# Patient Record
Sex: Male | Born: 1935 | Race: White | Hispanic: No | Smoking: Former smoker
Health system: Southern US, Community
[De-identification: ages and names within clinical notes are randomized; demographics above are authoritative.]

## PROBLEM LIST (undated history)

## (undated) DIAGNOSIS — C801 Malignant (primary) neoplasm, unspecified: Secondary | ICD-10-CM

## (undated) DIAGNOSIS — Z87442 Personal history of urinary calculi: Secondary | ICD-10-CM

## (undated) DIAGNOSIS — M1990 Unspecified osteoarthritis, unspecified site: Secondary | ICD-10-CM

## (undated) DIAGNOSIS — I4891 Unspecified atrial fibrillation: Secondary | ICD-10-CM

## (undated) DIAGNOSIS — I499 Cardiac arrhythmia, unspecified: Secondary | ICD-10-CM

## (undated) HISTORY — DX: Malignant (primary) neoplasm, unspecified: C80.1

## (undated) HISTORY — PX: OTHER SURGICAL HISTORY: SHX169

## (undated) HISTORY — PX: JOINT REPLACEMENT: SHX530

## (undated) HISTORY — PX: CHOLECYSTECTOMY: SHX55

## (undated) HISTORY — PX: HERNIA REPAIR: SHX51

## (undated) HISTORY — PX: PROSTATE SURGERY: SHX751

---

## 2010-08-26 ENCOUNTER — Inpatient Hospital Stay (HOSPITAL_COMMUNITY): Admission: EM | Admit: 2010-08-26 | Discharge: 2010-09-01 | Payer: Self-pay | Admitting: Emergency Medicine

## 2011-02-24 LAB — URINE CULTURE
Colony Count: NO GROWTH
Culture: NO GROWTH

## 2011-02-24 LAB — CARDIAC PANEL(CRET KIN+CKTOT+MB+TROPI)
CK, MB: 2.4 ng/mL (ref 0.3–4.0)
CK, MB: 2.9 ng/mL (ref 0.3–4.0)
CK, MB: 2.9 ng/mL (ref 0.3–4.0)
Relative Index: INVALID (ref 0.0–2.5)
Relative Index: INVALID (ref 0.0–2.5)
Total CK: 55 U/L (ref 7–232)
Total CK: 67 U/L (ref 7–232)
Troponin I: 0.02 ng/mL (ref 0.00–0.06)
Troponin I: 0.03 ng/mL (ref 0.00–0.06)

## 2011-02-24 LAB — BASIC METABOLIC PANEL
BUN: 14 mg/dL (ref 6–23)
CO2: 28 mEq/L (ref 19–32)
Calcium: 8.5 mg/dL (ref 8.4–10.5)
Calcium: 8.8 mg/dL (ref 8.4–10.5)
Chloride: 111 mEq/L (ref 96–112)
Creatinine, Ser: 1.12 mg/dL (ref 0.4–1.5)
GFR calc Af Amer: >60 mL/min (ref >60)
GFR calc Af Amer: >60 mL/min (ref >60)
GFR calc non Af Amer: >60 mL/min (ref >60)
GFR calc non Af Amer: >60 mL/min (ref >60)
Glucose, Bld: 115 mg/dL — ABNORMAL HIGH (ref 70–99)
Glucose, Bld: 96 mg/dL (ref 70–99)
Potassium: 4.6 mEq/L (ref 3.5–5.1)
Sodium: 140 mEq/L (ref 135–145)
Sodium: 142 mEq/L (ref 135–145)

## 2011-02-24 LAB — POCT I-STAT, CHEM 8
Calcium, Ion: 1.09 mmol/L — ABNORMAL LOW (ref 1.12–1.32)
Hemoglobin: 18 g/dL — ABNORMAL HIGH (ref 13.0–17.0)
Sodium: 142 mEq/L (ref 135–145)
TCO2: 23 mmol/L (ref 0–100)

## 2011-02-24 LAB — POCT CARDIAC MARKERS
CKMB, poc: <1 ng/mL — ABNORMAL LOW (ref 1.0–8.0)
Myoglobin, poc: 75.1 ng/mL (ref 12–200)
Myoglobin, poc: 99.8 ng/mL (ref 12–200)

## 2011-02-24 LAB — CBC
HCT: 41 % (ref 39.0–52.0)
HCT: 45.1 % (ref 39.0–52.0)
HCT: 46.8 % (ref 39.0–52.0)
HCT: 48.6 % (ref 39.0–52.0)
Hemoglobin: 13.1 g/dL (ref 13.0–17.0)
Hemoglobin: 13.3 g/dL (ref 13.0–17.0)
Hemoglobin: 16 g/dL (ref 13.0–17.0)
MCH: 31 pg (ref 26.0–34.0)
MCH: 31.1 pg (ref 26.0–34.0)
MCH: 31.3 pg (ref 26.0–34.0)
MCHC: 34.1 g/dL (ref 30.0–36.0)
MCHC: 34.2 g/dL (ref 30.0–36.0)
MCHC: 34.4 g/dL (ref 30.0–36.0)
MCV: 91.1 fL (ref 78.0–100.0)
MCV: 92.3 fL (ref 78.0–100.0)
MCV: 93.1 fL (ref 78.0–100.0)
Platelets: 197 10*3/uL (ref 150–400)
Platelets: 205 10*3/uL (ref 150–400)
Platelets: 207 10*3/uL (ref 150–400)
Platelets: 247 10*3/uL (ref 150–400)
RBC: 4.23 MIL/uL (ref 4.22–5.81)
RBC: 4.3 MIL/uL (ref 4.22–5.81)
RDW: 13.9 % (ref 11.5–15.5)
RDW: 14.1 % (ref 11.5–15.5)
RDW: 14.1 % (ref 11.5–15.5)
RDW: 14.2 % (ref 11.5–15.5)
RDW: 14.3 % (ref 11.5–15.5)
WBC: 6.2 10*3/uL (ref 4.0–10.5)
WBC: 7.3 10*3/uL (ref 4.0–10.5)
WBC: 7.3 10*3/uL (ref 4.0–10.5)

## 2011-02-24 LAB — LIPID PANEL
Cholesterol: 178 mg/dL (ref 0–200)
LDL Cholesterol: 128 mg/dL — ABNORMAL HIGH (ref 0–99)
Triglycerides: 55 mg/dL (ref <150)
VLDL: 11 mg/dL (ref 0–40)

## 2011-02-24 LAB — DIFFERENTIAL
Basophils Absolute: 0 10*3/uL (ref 0.0–0.1)
Basophils Relative: 1 % (ref 0–1)
Lymphocytes Relative: 24 % (ref 12–46)
Neutro Abs: 4.9 10*3/uL (ref 1.7–7.7)
Neutrophils Relative %: 67 % (ref 43–77)

## 2011-02-24 LAB — APTT: aPTT: 29 seconds (ref 24–37)

## 2011-02-24 LAB — PROTIME-INR
INR: 0.92 (ref 0.00–1.49)
INR: 1.08 (ref 0.00–1.49)
INR: 1.18 (ref 0.00–1.49)
Prothrombin Time: 12.6 seconds (ref 11.6–15.2)
Prothrombin Time: 14.2 seconds (ref 11.6–15.2)
Prothrombin Time: 15.2 seconds (ref 11.6–15.2)
Prothrombin Time: 22.5 seconds — ABNORMAL HIGH (ref 11.6–15.2)

## 2011-02-24 LAB — URINALYSIS, ROUTINE W REFLEX MICROSCOPIC
Bilirubin Urine: NEGATIVE
Glucose, UA: NEGATIVE mg/dL
Hgb urine dipstick: NEGATIVE
Specific Gravity, Urine: 1.022 (ref 1.005–1.030)
Urobilinogen, UA: 1 mg/dL (ref 0.0–1.0)

## 2011-02-24 LAB — TSH: TSH: 1.116 u[IU]/mL (ref 0.350–4.500)

## 2011-02-24 LAB — HEPARIN LEVEL (UNFRACTIONATED)
Heparin Unfractionated: 0.28 IU/mL — ABNORMAL LOW (ref 0.30–0.70)
Heparin Unfractionated: 0.28 IU/mL — ABNORMAL LOW (ref 0.30–0.70)
Heparin Unfractionated: 0.48 IU/mL (ref 0.30–0.70)

## 2011-12-10 ENCOUNTER — Emergency Department (HOSPITAL_COMMUNITY): Payer: Medicare Other

## 2011-12-10 ENCOUNTER — Inpatient Hospital Stay (HOSPITAL_COMMUNITY)
Admission: EM | Admit: 2011-12-10 | Discharge: 2011-12-11 | DRG: 313 | Disposition: A | Discharge status: Home / Self Care | Payer: Medicare Other | Attending: Cardiovascular Disease | Admitting: Cardiovascular Disease

## 2011-12-10 ENCOUNTER — Other Ambulatory Visit: Payer: Self-pay

## 2011-12-10 DIAGNOSIS — I10 Essential (primary) hypertension: Secondary | ICD-10-CM | POA: Diagnosis present

## 2011-12-10 DIAGNOSIS — Z79899 Other long term (current) drug therapy: Secondary | ICD-10-CM

## 2011-12-10 DIAGNOSIS — R072 Precordial pain: Principal | ICD-10-CM | POA: Diagnosis present

## 2011-12-10 DIAGNOSIS — I4891 Unspecified atrial fibrillation: Secondary | ICD-10-CM | POA: Diagnosis present

## 2011-12-10 DIAGNOSIS — I2510 Atherosclerotic heart disease of native coronary artery without angina pectoris: Secondary | ICD-10-CM | POA: Diagnosis present

## 2011-12-10 DIAGNOSIS — Z881 Allergy status to other antibiotic agents status: Secondary | ICD-10-CM

## 2011-12-10 DIAGNOSIS — Z888 Allergy status to other drugs, medicaments and biological substances status: Secondary | ICD-10-CM

## 2011-12-10 DIAGNOSIS — I200 Unstable angina: Secondary | ICD-10-CM

## 2011-12-10 DIAGNOSIS — Z7901 Long term (current) use of anticoagulants: Secondary | ICD-10-CM

## 2011-12-10 DIAGNOSIS — Z96649 Presence of unspecified artificial hip joint: Secondary | ICD-10-CM

## 2011-12-10 DIAGNOSIS — Z882 Allergy status to sulfonamides status: Secondary | ICD-10-CM

## 2011-12-10 DIAGNOSIS — F17200 Nicotine dependence, unspecified, uncomplicated: Secondary | ICD-10-CM | POA: Diagnosis present

## 2011-12-10 DIAGNOSIS — R079 Chest pain, unspecified: Secondary | ICD-10-CM | POA: Diagnosis present

## 2011-12-10 HISTORY — DX: Unspecified osteoarthritis, unspecified site: M19.90

## 2011-12-10 HISTORY — DX: Cardiac arrhythmia, unspecified: I49.9

## 2011-12-10 LAB — DIFFERENTIAL
Basophils Absolute: 0 10*3/uL (ref 0.0–0.1)
Eosinophils Absolute: 0.1 10*3/uL (ref 0.0–0.7)
Lymphocytes Relative: 11 % — ABNORMAL LOW (ref 12–46)
Lymphs Abs: 1.2 10*3/uL (ref 0.7–4.0)
Neutrophils Relative %: 81 % — ABNORMAL HIGH (ref 43–77)

## 2011-12-10 LAB — COMPREHENSIVE METABOLIC PANEL
ALT: 14 U/L (ref 0–53)
AST: 15 U/L (ref 0–37)
Alkaline Phosphatase: 104 U/L (ref 39–117)
CO2: 26 mEq/L (ref 19–32)
GFR calc Af Amer: 73 mL/min — ABNORMAL LOW (ref >90)
Glucose, Bld: 108 mg/dL — ABNORMAL HIGH (ref 70–99)
Potassium: 4.5 mEq/L (ref 3.5–5.1)
Sodium: 141 mEq/L (ref 135–145)
Total Protein: 6.9 g/dL (ref 6.0–8.3)

## 2011-12-10 LAB — PROTIME-INR
INR: 2.21 — ABNORMAL HIGH (ref 0.00–1.49)
Prothrombin Time: 24.9 seconds — ABNORMAL HIGH (ref 11.6–15.2)

## 2011-12-10 LAB — APTT: aPTT: 38 seconds — ABNORMAL HIGH (ref 24–37)

## 2011-12-10 LAB — CBC
Platelets: 247 10*3/uL (ref 150–400)
RBC: 4.77 MIL/uL (ref 4.22–5.81)
WBC: 10.9 10*3/uL — ABNORMAL HIGH (ref 4.0–10.5)

## 2011-12-10 LAB — CARDIAC PANEL(CRET KIN+CKTOT+MB+TROPI)
CK, MB: 2.5 ng/mL (ref 0.3–4.0)
Relative Index: INVALID (ref 0.0–2.5)
Troponin I: <0.3 ng/mL (ref <0.30)

## 2011-12-10 MED ORDER — SODIUM CHLORIDE 0.9 % IV SOLN
INTRAVENOUS | Status: DC
  Administered 2011-12-10: 23:00:00 via INTRAVENOUS

## 2011-12-10 NOTE — ED Provider Notes (Signed)
History     CSN: [id number]  Arrival date & time 12/10/11  2128   First MD Initiated Contact with Patient 12/10/11 2156      Chief Complaint  Patient presents with  . Chest Pain    (Consider location/radiation/quality/duration/timing/severity/associated sxs/prior treatment) HPI Comments: Patient is a 75 year old man who says he was watching television the night. He developed pressure in his chest became dizzy and weak. He had profuse sweating. He took a sublingual nitroglycerin at home without relief. EMS was called and they gave him sublingual nitroglycerin with relief. The pain was felt in the precordium radiated to the left shoulder. He has a prior history of heart trouble, apparently atrial fibrillation, and is on Coumadin, he is followed for this by age Orpah Cobb, M.D. his cardiologist.  Patient is a 75 y.o. male presenting with chest pain.  Chest Pain The chest pain began 1 - 2 hours ago. Duration of episode(s) is 30 minutes. Chest pain occurs constantly. The chest pain is resolved. The severity of the pain is moderate. The quality of the pain is described as pressure-like. The pain radiates to the left shoulder. Pertinent negatives for primary symptoms include no fever. He tried nitroglycerin and oxygen for the symptoms. Risk factors include male gender and being elderly.  Procedure history is positive for cardiac catheterization.     Past Medical History  Diagnosis Date  . Hypertension     History reviewed. No pertinent past surgical history.  No family history on file.  History  Substance Use Topics  . Smoking status: Not on file  . Smokeless tobacco: Not on file  . Alcohol Use: No      Review of Systems  Constitutional: Negative.  Negative for fever.  Eyes: Negative.   Respiratory: Negative.   Cardiovascular: Positive for chest pain.  Gastrointestinal: Negative.   Genitourinary: Negative.   Musculoskeletal: Negative.   Skin: Negative for color change.         Sweating   Neurological: Negative.   Hematological: Negative.   Psychiatric/Behavioral: Negative.     Allergies  Ciprofloxacin; Sulfa antibiotics; and Tramadol  Home Medications   Current Outpatient Rx  Name Route Sig Dispense Refill  . MELOXICAM 7.5 MG PO TABS Oral Take 15 mg by mouth every 12 (twelve) hours as needed. For pain/inflamation     . NITROGLYCERIN 0.4 MG SL SUBL Sublingual Place 0.4 mg under the tongue every 5 (five) minutes as needed. For chest pain     . WARFARIN SODIUM 5 MG PO TABS Oral Take 2.5-5 mg by mouth daily. Take 2.5mg  on tues and thurs, and 5mg  on all other days       BP 116/67  Pulse 74  Temp(Src) 98 F (36.7 C) (Oral)  Resp 18  SpO2 97%  Physical Exam  Nursing note and vitals reviewed. Constitutional: He is oriented to person, place, and time. He appears well-developed and well-nourished. No distress.  HENT:  Head: Normocephalic and atraumatic.  Right Ear: External ear normal.  Left Ear: External ear normal.  Mouth/Throat: Oropharynx is clear and moist.  Eyes: Conjunctivae and EOM are normal. Pupils are equal, round, and reactive to light.  Neck: Normal range of motion. Neck supple.  Cardiovascular: Normal rate, regular rhythm and normal heart sounds.   Pulmonary/Chest: Effort normal and breath sounds normal.  Abdominal: Soft. Bowel sounds are normal.  Musculoskeletal: Normal range of motion. He exhibits no edema.  Neurological: He is alert and oriented to person, place, and time.  Sensory or motor deficit.  Skin: Skin is warm and dry.  Psychiatric: He has a normal mood and affect. His behavior is normal.    ED Course  Procedures (including critical care time)   Labs Reviewed  CBC  DIFFERENTIAL  COMPREHENSIVE METABOLIC PANEL  URINALYSIS, ROUTINE W REFLEX MICROSCOPIC  CARDIAC PANEL(CRET KIN+CKTOT+MB+TROPI)  PROTIME-INR  APTT  D-DIMER, QUANTITATIVE  PRO B NATRIURETIC PEPTIDE    10:36 PM Patient was seen and had  physical examination. EKG was nonacute. Laboratory tests were ordered. Old charts were reviewed.  11:49 PM Results for orders placed during the hospital encounter of 12/10/11  CBC      Component Value Range   WBC 10.9 (*) 4.0 - 10.5 (K/uL)   RBC 4.77  4.22 - 5.81 (MIL/uL)   Hemoglobin 15.4  13.0 - 17.0 (g/dL)   HCT 11.9  14.7 - 82.9 (%)   MCV 92.2  78.0 - 100.0 (fL)   MCH 32.3  26.0 - 34.0 (pg)   MCHC 35.0  30.0 - 36.0 (g/dL)   RDW 56.2  13.0 - 86.5 (%)   Platelets 247  150 - 400 (K/uL)  DIFFERENTIAL      Component Value Range   Neutrophils Relative 81 (*) 43 - 77 (%)   Neutro Abs 8.9 (*) 1.7 - 7.7 (K/uL)   Lymphocytes Relative 11 (*) 12 - 46 (%)   Lymphs Abs 1.2  0.7 - 4.0 (K/uL)   Monocytes Relative 6  3 - 12 (%)   Monocytes Absolute 0.7  0.1 - 1.0 (K/uL)   Eosinophils Relative 1  0 - 5 (%)   Eosinophils Absolute 0.1  0.0 - 0.7 (K/uL)   Basophils Relative 0  0 - 1 (%)   Basophils Absolute 0.0  0.0 - 0.1 (K/uL)  COMPREHENSIVE METABOLIC PANEL      Component Value Range   Sodium 141  135 - 145 (mEq/L)   Potassium 4.5  3.5 - 5.1 (mEq/L)   Chloride 108  96 - 112 (mEq/L)   CO2 26  19 - 32 (mEq/L)   Glucose, Bld 108 (*) 70 - 99 (mg/dL)   BUN 20  6 - 23 (mg/dL)   Creatinine, Ser 7.84  0.50 - 1.35 (mg/dL)   Calcium 9.1  8.4 - 69.6 (mg/dL)   Total Protein 6.9  6.0 - 8.3 (g/dL)   Albumin 4.0  3.5 - 5.2 (g/dL)   AST 15  0 - 37 (U/L)   ALT 14  0 - 53 (U/L)   Alkaline Phosphatase 104  39 - 117 (U/L)   Total Bilirubin 0.4  0.3 - 1.2 (mg/dL)   GFR calc non Af Amer 63 (*) >90 (mL/min)   GFR calc Af Amer 73 (*) >90 (mL/min)  CARDIAC PANEL(CRET KIN+CKTOT+MB+TROPI)      Component Value Range   Total CK 47  7 - 232 (U/L)   CK, MB 2.5  0.3 - 4.0 (ng/mL)   Troponin I <0.30  <0.30 (ng/mL)   Relative Index RELATIVE INDEX IS INVALID  0.0 - 2.5   PROTIME-INR      Component Value Range   Prothrombin Time 24.9 (*) 11.6 - 15.2 (seconds)   INR 2.21 (*) 0.00 - 1.49   APTT      Component  Value Range   aPTT 38 (*) 24 - 37 (seconds)  D-DIMER, QUANTITATIVE      Component Value Range   D-Dimer, Quant 0.23  0.00 - 0.48 (ug/mL-FEU)  PRO B NATRIURETIC PEPTIDE  Component Value Range   Pro B Natriuretic peptide (BNP) 83.1  0 - 125 (pg/mL)   Dg Chest 2 View  12/10/2011  *RADIOLOGY REPORT*  Clinical Data: Chest pain and shortness of breath.  Syncope.  CHEST - 2 VIEW  Comparison: 08/26/2010  Findings: Mild hyperinflation. Midline trachea.  Normal heart size and mediastinal contours. No pleural effusion or pneumothorax. Biapical pleural parenchymal scarring.  Mild interstitial thickening. Clear lungs.  IMPRESSION: COPD/chronic bronchitis. No acute superimposed process.  Original Report Authenticated By: Consuello Bossier, M.D.   11:54 PM Lab workup showed no acute MI.  His INR is therapeutic.  He continues to be pain free.  Call to Dr. Algie Coffer -->  He will  Be in to admit her.     1. Chest pain   2. Unstable angina           Carleene Cooper III, MD 12/11/11 [phone]

## 2011-12-10 NOTE — ED Notes (Signed)
Pt is unable to give a urine sample at this time. 

## 2011-12-11 ENCOUNTER — Inpatient Hospital Stay (HOSPITAL_COMMUNITY): Payer: Medicare Other

## 2011-12-11 ENCOUNTER — Other Ambulatory Visit: Payer: Self-pay

## 2011-12-11 ENCOUNTER — Encounter (HOSPITAL_COMMUNITY): Payer: Self-pay | Admitting: *Deleted

## 2011-12-11 DIAGNOSIS — I2510 Atherosclerotic heart disease of native coronary artery without angina pectoris: Secondary | ICD-10-CM | POA: Diagnosis present

## 2011-12-11 DIAGNOSIS — I10 Essential (primary) hypertension: Secondary | ICD-10-CM | POA: Diagnosis present

## 2011-12-11 DIAGNOSIS — I480 Paroxysmal atrial fibrillation: Secondary | ICD-10-CM | POA: Insufficient documentation

## 2011-12-11 DIAGNOSIS — R079 Chest pain, unspecified: Secondary | ICD-10-CM | POA: Diagnosis present

## 2011-12-11 LAB — CBC
HCT: 41.8 % (ref 39.0–52.0)
Hemoglobin: 14 g/dL (ref 13.0–17.0)
MCHC: 33.5 g/dL (ref 30.0–36.0)
RDW: 14.7 % (ref 11.5–15.5)
WBC: 6.8 10*3/uL (ref 4.0–10.5)

## 2011-12-11 LAB — URINALYSIS, ROUTINE W REFLEX MICROSCOPIC
Glucose, UA: NEGATIVE mg/dL
Ketones, ur: 15 mg/dL — AB
Nitrite: NEGATIVE
pH: 5.5 (ref 5.0–8.0)

## 2011-12-11 LAB — LIPID PANEL
HDL: 29 mg/dL — ABNORMAL LOW (ref >39)
LDL Cholesterol: 120 mg/dL — ABNORMAL HIGH (ref 0–99)
Triglycerides: 123 mg/dL (ref <150)
VLDL: 25 mg/dL (ref 0–40)

## 2011-12-11 LAB — CARDIAC PANEL(CRET KIN+CKTOT+MB+TROPI)
CK, MB: 2.3 ng/mL (ref 0.3–4.0)
CK, MB: 2.3 ng/mL (ref 0.3–4.0)
Relative Index: INVALID (ref 0.0–2.5)
Troponin I: <0.3 ng/mL (ref <0.30)
Troponin I: <0.3 ng/mL (ref <0.30)

## 2011-12-11 LAB — URINE MICROSCOPIC-ADD ON

## 2011-12-11 LAB — BASIC METABOLIC PANEL
BUN: 19 mg/dL (ref 6–23)
Chloride: 110 mEq/L (ref 96–112)
GFR calc Af Amer: 74 mL/min — ABNORMAL LOW (ref >90)
GFR calc non Af Amer: 64 mL/min — ABNORMAL LOW (ref >90)
Potassium: 4.4 mEq/L (ref 3.5–5.1)
Sodium: 142 mEq/L (ref 135–145)

## 2011-12-11 MED ORDER — NITROGLYCERIN 0.4 MG SL SUBL: 0.4000 mg | SUBLINGUAL_TABLET | SUBLINGUAL | Status: DC | PRN

## 2011-12-11 MED ORDER — METOPROLOL TARTRATE 12.5 MG HALF TABLET
12.5000 mg | ORAL_TABLET | Freq: Two times a day (BID) | ORAL | Status: DC
  Filled 2011-12-11 (×2): qty 1

## 2011-12-11 MED ORDER — ALPRAZOLAM 0.25 MG PO TABS: 0.2500 mg | ORAL_TABLET | Freq: Two times a day (BID) | ORAL | Status: DC | PRN

## 2011-12-11 MED ORDER — MELOXICAM 15 MG PO TABS
15.0000 mg | ORAL_TABLET | Freq: Two times a day (BID) | ORAL | Status: DC | PRN
  Filled 2011-12-11: qty 1

## 2011-12-11 MED ORDER — ZOLPIDEM TARTRATE 5 MG PO TABS: 5.0000 mg | ORAL_TABLET | Freq: Every evening | ORAL | Status: DC | PRN

## 2011-12-11 MED ORDER — TECHNETIUM TC 99M TETROFOSMIN IV KIT
30.0000 | PACK | Freq: Once | INTRAVENOUS | Status: AC | PRN
  Administered 2011-12-11: 30 via INTRAVENOUS

## 2011-12-11 MED ORDER — TECHNETIUM TC 99M TETROFOSMIN IV KIT
10.0000 | PACK | Freq: Once | INTRAVENOUS | Status: AC | PRN
  Administered 2011-12-11: 10 via INTRAVENOUS

## 2011-12-11 MED ORDER — ROSUVASTATIN CALCIUM 5 MG PO TABS
5.0000 mg | ORAL_TABLET | Freq: Every day | ORAL | Status: DC
  Filled 2011-12-11: qty 1

## 2011-12-11 MED ORDER — ONDANSETRON HCL 4 MG/2ML IJ SOLN: 4.0000 mg | Freq: Four times a day (QID) | INTRAMUSCULAR | Status: DC | PRN

## 2011-12-11 MED ORDER — ACETAMINOPHEN 325 MG PO TABS: 650.0000 mg | ORAL_TABLET | ORAL | Status: DC | PRN

## 2011-12-11 MED ORDER — WARFARIN SODIUM 4 MG PO TABS
4.0000 mg | ORAL_TABLET | Freq: Every day | ORAL | Status: DC
  Filled 2011-12-11: qty 1

## 2011-12-11 MED ORDER — SODIUM CHLORIDE 0.9 % IV SOLN
INTRAVENOUS | Status: DC
  Administered 2011-12-11: 50 mL/h via INTRAVENOUS

## 2011-12-11 MED ORDER — REGADENOSON 0.4 MG/5ML IV SOLN
0.4000 mg | Freq: Once | INTRAVENOUS | Status: AC
  Administered 2011-12-11: 0.4 mg via INTRAVENOUS
  Filled 2011-12-11: qty 5

## 2011-12-11 NOTE — ED Notes (Signed)
Pt received 324mg  aspirin PTA

## 2011-12-11 NOTE — H&P (Signed)
Todd Salazar is an 75 y.o. male.   Chief Complaint: Chest pain HPI: 75 years old white male with chest pain, retrosternal without radiation, not responding to own SL NTG but responding to SL NTG given by EMS. Minimal chest discomfort now. No fever or cough.   Past Medical History  Diagnosis Date  . Hypertension   + Tobacco chewing. No DM, II, No Hyperlipidemia. No MI.  History reviewed. Left hip replacement 6 years ago. Prostate surgery 16 years ago.  History reviewed. No pertinent family history. Social History:  does not have a smoking history on file. He does not have any smokeless tobacco history on file. He reports that he does not drink alcohol or use illicit drugs. Retired and widower, wife died of multiorgan failure. Has 3 sisters, 2 living. 4 brothers, all living.   Allergies:  Allergies  Allergen Reactions  . Ciprofloxacin Other (See Comments)    Reaction unknown  . Sulfa Antibiotics   . Tramadol Other (See Comments)    Reaction unknown    Medications Prior to Admission  Medication Dose Route Frequency Provider Last Rate Last Dose  . 0.9 %  sodium chloride infusion   Intravenous Continuous Carleene Cooper III, MD 160 mL/hr at 12/10/11 2240     No current outpatient prescriptions on file as of 12/10/2011.    Results for orders placed during the hospital encounter of 12/10/11 (from the past 48 hour(s))  CBC     Status: Abnormal   Collection Time   12/10/11 10:32 PM      Component Value Range Comment   WBC 10.9 (*) 4.0 - 10.5 (K/uL)    RBC 4.77  4.22 - 5.81 (MIL/uL)    Hemoglobin 15.4  13.0 - 17.0 (g/dL)    HCT 16.1  09.6 - 04.5 (%)    MCV 92.2  78.0 - 100.0 (fL)    MCH 32.3  26.0 - 34.0 (pg)    MCHC 35.0  30.0 - 36.0 (g/dL)    RDW 40.9  81.1 - 91.4 (%)    Platelets 247  150 - 400 (K/uL)   DIFFERENTIAL     Status: Abnormal   Collection Time   12/10/11 10:32 PM      Component Value Range Comment   Neutrophils Relative 81 (*) 43 - 77 (%)    Neutro Abs 8.9 (*) 1.7  - 7.7 (K/uL)    Lymphocytes Relative 11 (*) 12 - 46 (%)    Lymphs Abs 1.2  0.7 - 4.0 (K/uL)    Monocytes Relative 6  3 - 12 (%)    Monocytes Absolute 0.7  0.1 - 1.0 (K/uL)    Eosinophils Relative 1  0 - 5 (%)    Eosinophils Absolute 0.1  0.0 - 0.7 (K/uL)    Basophils Relative 0  0 - 1 (%)    Basophils Absolute 0.0  0.0 - 0.1 (K/uL)   COMPREHENSIVE METABOLIC PANEL     Status: Abnormal   Collection Time   12/10/11 10:32 PM      Component Value Range Comment   Sodium 141  135 - 145 (mEq/L)    Potassium 4.5  3.5 - 5.1 (mEq/L)    Chloride 108  96 - 112 (mEq/L)    CO2 26  19 - 32 (mEq/L)    Glucose, Bld 108 (*) 70 - 99 (mg/dL)    BUN 20  6 - 23 (mg/dL)    Creatinine, Ser 7.82  0.50 - 1.35 (mg/dL)  Calcium 9.1  8.4 - 10.5 (mg/dL)    Total Protein 6.9  6.0 - 8.3 (g/dL)    Albumin 4.0  3.5 - 5.2 (g/dL)    AST 15  0 - 37 (U/L)    ALT 14  0 - 53 (U/L)    Alkaline Phosphatase 104  39 - 117 (U/L)    Total Bilirubin 0.4  0.3 - 1.2 (mg/dL)    GFR calc non Af Amer 63 (*) >90 (mL/min)    GFR calc Af Amer 73 (*) >90 (mL/min)   PROTIME-INR     Status: Abnormal   Collection Time   12/10/11 10:32 PM      Component Value Range Comment   Prothrombin Time 24.9 (*) 11.6 - 15.2 (seconds)    INR 2.21 (*) 0.00 - 1.49    APTT     Status: Abnormal   Collection Time   12/10/11 10:32 PM      Component Value Range Comment   aPTT 38 (*) 24 - 37 (seconds)   D-DIMER, QUANTITATIVE     Status: Normal   Collection Time   12/10/11 10:32 PM      Component Value Range Comment   D-Dimer, Quant 0.23  0.00 - 0.48 (ug/mL-FEU)   CARDIAC PANEL(CRET KIN+CKTOT+MB+TROPI)     Status: Normal   Collection Time   12/10/11 10:33 PM      Component Value Range Comment   Total CK 47  7 - 232 (U/L)    CK, MB 2.5  0.3 - 4.0 (ng/mL)    Troponin I <0.30  <0.30 (ng/mL)    Relative Index RELATIVE INDEX IS INVALID  0.0 - 2.5    PRO B NATRIURETIC PEPTIDE     Status: Normal   Collection Time   12/10/11 10:33 PM      Component  Value Range Comment   Pro B Natriuretic peptide (BNP) 83.1  0 - 125 (pg/mL)   URINALYSIS, ROUTINE W REFLEX MICROSCOPIC     Status: Abnormal   Collection Time   12/11/11 12:05 AM      Component Value Range Comment   Color, Urine YELLOW  YELLOW     APPearance CLEAR  CLEAR     Specific Gravity, Urine 1.024  1.005 - 1.030     pH 5.5  5.0 - 8.0     Glucose, UA NEGATIVE  NEGATIVE (mg/dL)    Hgb urine dipstick TRACE (*) NEGATIVE     Bilirubin Urine SMALL (*) NEGATIVE     Ketones, ur 15 (*) NEGATIVE (mg/dL)    Protein, ur NEGATIVE  NEGATIVE (mg/dL)    Urobilinogen, UA 1.0  0.0 - 1.0 (mg/dL)    Nitrite NEGATIVE  NEGATIVE     Leukocytes, UA NEGATIVE  NEGATIVE    URINE MICROSCOPIC-ADD ON     Status: Abnormal   Collection Time   12/11/11 12:05 AM      Component Value Range Comment   WBC, UA 0-2  <3 (WBC/hpf)    RBC / HPF 3-6  <3 (RBC/hpf)    Bacteria, UA FEW (*) RARE     Casts HYALINE CASTS (*) NEGATIVE     Urine-Other MUCOUS PRESENT      Dg Chest 2 View  12/10/2011  *RADIOLOGY REPORT*  Clinical Data: Chest pain and shortness of breath.  Syncope.  CHEST - 2 VIEW  Comparison: 08/26/2010  Findings: Mild hyperinflation. Midline trachea.  Normal heart size and mediastinal contours. No pleural effusion or pneumothorax. Biapical pleural parenchymal scarring.  Mild interstitial thickening. Clear lungs.  IMPRESSION: COPD/chronic bronchitis. No acute superimposed process.  Original Report Authenticated By: Consuello Bossier, M.D.   EKG-NSR, LVH, APC.  Review of Systems  Constitutional: Negative for fever, chills, weight loss and diaphoresis.  HENT: Negative for hearing loss, ear pain, nosebleeds and sore throat.   Eyes: Negative for blurred vision, discharge and redness.  Respiratory: Negative for cough, hemoptysis, shortness of breath and wheezing.   Cardiovascular: Positive for chest pain. Negative for palpitations, orthopnea and leg swelling.  Gastrointestinal: Negative for heartburn, vomiting,  abdominal pain and blood in stool.  Genitourinary: Negative for dysuria.  Musculoskeletal: Positive for joint pain. Negative for myalgias.  Skin: Negative for rash.  Neurological: Negative for dizziness, speech change, focal weakness, seizures, loss of consciousness, weakness and headaches.  Endo/Heme/Allergies: Does not bruise/bleed easily.  Psychiatric/Behavioral: Positive for depression. Negative for suicidal ideas, hallucinations and substance abuse. The patient does not have insomnia.     Blood pressure 116/67, pulse 74, temperature 98 F (36.7 C), temperature source Oral, resp. rate 18, SpO2 97.00%.  Physical Exam  Constitutional: He is oriented to person, place, and time. He appears well-developed and well-nourished.  HENT:  Head: Normocephalic and atraumatic.  Eyes: Conjunctivae are normal. Pupils are equal, round, and reactive to light. Right eye exhibits no discharge. No scleral icterus.  Neck: Normal range of motion. No tracheal deviation present. No thyromegaly present.  Cardiovascular: Normal rate and regular rhythm.  Exam reveals no friction rub.   Murmur heard. Respiratory: No respiratory distress. He has no wheezes. He exhibits no tenderness.  GI: He exhibits no distension. There is no tenderness.  Musculoskeletal: Normal range of motion. He exhibits no edema.  Neurological: He is alert and oriented to person, place, and time. No cranial nerve deficit.  Skin: Skin is warm and dry.  Psychiatric: He has a normal mood and affect.     Assessment/Plan Chest pain r/o MI Atrial fibrillation history Chronic coumadin use  Admit to telebed Nuclear stress test. Continue coumadin   Delinda Malan S 12/11/2011, 12:48 AM

## 2011-12-11 NOTE — Progress Notes (Signed)
Pt. Discharged 12/11/2011  5:51 PM Discharge instructions reviewed with patient/family. Patient/family verbalized understanding. All Rx's given. Questions answered as needed. Pt. Discharged to home with family/self. Taken off unit via W/C. Dirk Vanaman, Chrystine Oiler

## 2011-12-11 NOTE — Discharge Summary (Signed)
Physician Discharge Summary  Patient ID: Todd Salazar MRN: [id number] DOB/AGE: 1936-11-01 75 y.o.  Admit date: 12/10/2011 Discharge date: 12/11/2011  Admission Diagnoses: Chest pain at rest Active Problems:  CAD (coronary artery disease), native coronary artery  HTN (hypertension)  Discharge Diagnoses:  Principal Problem:  *Chest pain at rest Active Problems:  CAD (coronary artery disease), native coronary artery  HTN (hypertension)   Discharged Condition: good  Hospital Course: 75 years old white male with retrosternal and left precordial chest pain, some of which was reproducible by palpation, underwent nuclear stress test that failed to show reversible ischemia.  Consults: cardiology  Significant Diagnostic Studies: labs: Normal H/H and BMET and CK/MB/TI x 2.  Nuclear medicine: No evidence for pharmacologically induced ischemia. Normal wall motion with calculated ejection fraction of 67%.  CXR showed COPD/chronic bronchitis. No acute superimposed process.   Treatments: analgesia: acetaminophen and cardiac meds: metoprolol  Discharge Exam: Blood pressure 117/72, pulse 58, temperature 97.5 F (36.4 C), temperature source Oral, resp. rate 18, height 6\' 3"  (1.905 m), weight 90.719 kg (200 lb), SpO2 94.00%. Constitutional: He is oriented to person, place, and time. He appears well-developed and well-nourished.  Head: Normocephalic and atraumatic.  Eyes: Conjunctivae are normal. Pupils are equal, round, and reactive to light. Right eye exhibits no discharge. No scleral icterus.  Neck: Normal range of motion. No tracheal deviation present. No thyromegaly present.  Cardiovascular: Normal rate and regular rhythm. Exam reveals no friction rub. II/VI systolic murmur at LSB.  Respiratory: No respiratory distress. He has no wheezes. He exhibits point tenderness over left below the breast costochondral area.  GI: He exhibits no distension. There is no tenderness.  Musculoskeletal:  Normal range of motion. He exhibits no edema.  Neurological: He is alert and oriented to person, place, and time. No cranial nerve deficit.  Skin: Skin is warm and dry.  Psychiatric: He has a normal mood and affect.   Disposition: Home/self care   Medication List  As of 12/11/2011  5:18 PM   START taking these medications         * nitroGLYCERIN 0.4 MG SL tablet   Commonly known as: NITROSTAT   Place 1 tablet (0.4 mg total) under the tongue every 5 (five) minutes as needed for chest pain.     * Notice: This list has 1 medication(s) that are the same as other medications prescribed for you. Read the directions carefully, and ask your doctor or other care provider to review them with you.       CONTINUE taking these medications         meloxicam 7.5 MG tablet   Commonly known as: MOBIC      * nitroGLYCERIN 0.4 MG SL tablet   Commonly known as: NITROSTAT      warfarin 5 MG tablet   Commonly known as: COUMADIN     * Notice: This list has 1 medication(s) that are the same as other medications prescribed for you. Read the directions carefully, and ask your doctor or other care provider to review them with you.        Where to get your medications    These are the prescriptions that you need to pick up.   You may get these medications from any pharmacy.         nitroGLYCERIN 0.4 MG SL tablet           Follow-up Information    Follow up with [facility] S, MD in 12 days.  Contact information:   [address] [place] [address] [phone]          Signed: Ricki Rodriguez 12/11/2011, 5:18 PM

## 2012-12-08 ENCOUNTER — Emergency Department (HOSPITAL_BASED_OUTPATIENT_CLINIC_OR_DEPARTMENT_OTHER)
Admission: EM | Admit: 2012-12-08 | Discharge: 2012-12-08 | Disposition: A | Discharge status: Other Acute Inpatient Hospital | Payer: Medicare Other | Attending: Emergency Medicine | Admitting: Emergency Medicine

## 2012-12-08 ENCOUNTER — Encounter (HOSPITAL_BASED_OUTPATIENT_CLINIC_OR_DEPARTMENT_OTHER): Payer: Self-pay | Admitting: *Deleted

## 2012-12-08 ENCOUNTER — Emergency Department (HOSPITAL_BASED_OUTPATIENT_CLINIC_OR_DEPARTMENT_OTHER): Payer: Medicare Other

## 2012-12-08 DIAGNOSIS — R0789 Other chest pain: Secondary | ICD-10-CM | POA: Insufficient documentation

## 2012-12-08 DIAGNOSIS — R5381 Other malaise: Secondary | ICD-10-CM | POA: Insufficient documentation

## 2012-12-08 DIAGNOSIS — R42 Dizziness and giddiness: Secondary | ICD-10-CM | POA: Insufficient documentation

## 2012-12-08 DIAGNOSIS — Z8679 Personal history of other diseases of the circulatory system: Secondary | ICD-10-CM | POA: Insufficient documentation

## 2012-12-08 DIAGNOSIS — Z7901 Long term (current) use of anticoagulants: Secondary | ICD-10-CM | POA: Insufficient documentation

## 2012-12-08 DIAGNOSIS — Z8739 Personal history of other diseases of the musculoskeletal system and connective tissue: Secondary | ICD-10-CM | POA: Insufficient documentation

## 2012-12-08 DIAGNOSIS — R0602 Shortness of breath: Secondary | ICD-10-CM | POA: Insufficient documentation

## 2012-12-08 DIAGNOSIS — Z87891 Personal history of nicotine dependence: Secondary | ICD-10-CM | POA: Insufficient documentation

## 2012-12-08 DIAGNOSIS — R5383 Other fatigue: Secondary | ICD-10-CM | POA: Insufficient documentation

## 2012-12-08 DIAGNOSIS — Z79899 Other long term (current) drug therapy: Secondary | ICD-10-CM | POA: Insufficient documentation

## 2012-12-08 DIAGNOSIS — R079 Chest pain, unspecified: Secondary | ICD-10-CM

## 2012-12-08 DIAGNOSIS — R112 Nausea with vomiting, unspecified: Secondary | ICD-10-CM | POA: Insufficient documentation

## 2012-12-08 LAB — BASIC METABOLIC PANEL
Calcium: 9.2 mg/dL (ref 8.4–10.5)
Creatinine, Ser: 1.1 mg/dL (ref 0.50–1.35)
GFR calc non Af Amer: 64 mL/min — ABNORMAL LOW (ref >90)
Glucose, Bld: 131 mg/dL — ABNORMAL HIGH (ref 70–99)
Sodium: 140 mEq/L (ref 135–145)

## 2012-12-08 LAB — CBC
Hemoglobin: 14.8 g/dL (ref 13.0–17.0)
MCH: 31.8 pg (ref 26.0–34.0)
MCHC: 34.2 g/dL (ref 30.0–36.0)
MCV: 93.1 fL (ref 78.0–100.0)

## 2012-12-08 MED ORDER — ASPIRIN 81 MG PO CHEW
324.0000 mg | CHEWABLE_TABLET | Freq: Once | ORAL | Status: DC
  Filled 2012-12-08: qty 4

## 2012-12-08 MED ORDER — ONDANSETRON HCL 4 MG/2ML IJ SOLN
4.0000 mg | Freq: Once | INTRAMUSCULAR | Status: AC
  Administered 2012-12-08: 4 mg via INTRAVENOUS
  Filled 2012-12-08: qty 2

## 2012-12-08 MED ORDER — NITROGLYCERIN 0.4 MG SL SUBL
0.4000 mg | SUBLINGUAL_TABLET | SUBLINGUAL | Status: DC | PRN
  Administered 2012-12-08: 0.4 mg via SUBLINGUAL
  Filled 2012-12-08: qty 25

## 2012-12-08 NOTE — ED Notes (Signed)
Family at bedside. 

## 2012-12-08 NOTE — ED Notes (Signed)
Patient was on his way home from dinner when he began having chest pressure with SOB and dizziness. States that his HR was high at home, but slowed once at the ER.

## 2012-12-08 NOTE — ED Provider Notes (Signed)
History   This chart was scribed for Ethelda Chick, MD scribed by Magnus Sinning. The patient was seen in room MHT14/MHT14 at 18:36   CSN: [id number]  Arrival date & time 12/08/12  1752   Chief Complaint  Patient presents with  . Chest Pain    (Consider location/radiation/quality/duration/timing/severity/associated sxs/prior treatment) HPI Comments: Todd Salazar is a 76 y.o. male who presents to the Emergency Department complaining of constant moderate pressure-like CP ,more prominent on the left, onset this evening about 2 hours ago with associated nausea and vomiting. He says he was driving when he began feeling weak and light-headed. He says when he got home his heart rate was elevated, which the relative states was at 151 when checked.  Pt notes similar hx of sxs three years ago and relatives believe it was A.fib, but they are unsure. The patient says current CP is resolved following Nitro given in ED and reports hx of left heart cathertization performed at [facility] two years ago.  Cardiologist: Dr.Cheek in [facility] with [facility], but this is new. Just left previous cardiologist in [place].  PCP: in [place]  Patient is a 76 y.o. male presenting with chest pain. The history is provided by the patient. No language interpreter was used.  Chest Pain The chest pain began 1 - 2 hours ago. The chest pain is resolved. The severity of the pain is moderate. The quality of the pain is described as pressure-like. Primary symptoms include shortness of breath, nausea, vomiting and dizziness.  Dizziness also occurs with nausea, vomiting and weakness.  Associated symptoms include weakness. He tried nitroglycerin for the symptoms. Risk factors include male gender and being elderly.  Procedure history is positive for cardiac catheterization.     Past Medical History  Diagnosis Date  . Dysrhythmia   . Arthritis     Past Surgical History  Procedure Date  . Hernia repair   . Prostate  surgery   . Bone spur   . Cholecystectomy   . Joint replacement   . Kidney stones     No family history on file.  History  Substance Use Topics  . Smoking status: Former Smoker    Quit date: 12/10/1996  . Smokeless tobacco: Never Used  . Alcohol Use: No      Review of Systems  Respiratory: Positive for shortness of breath.   Cardiovascular: Positive for chest pain.  Gastrointestinal: Positive for nausea and vomiting.  Neurological: Positive for dizziness and weakness.  All other systems reviewed and are negative.    Allergies  Ciprofloxacin; Sulfa antibiotics; and Tramadol  Home Medications   Current Outpatient Rx  Name  Route  Sig  Dispense  Refill  . MELOXICAM 7.5 MG PO TABS   Oral   Take 15 mg by mouth every 12 (twelve) hours as needed. For pain/inflamation          . NITROGLYCERIN 0.4 MG SL SUBL   Sublingual   Place 0.4 mg under the tongue every 5 (five) minutes as needed. For chest pain          . NITROGLYCERIN 0.4 MG SL SUBL   Sublingual   Place 1 tablet (0.4 mg total) under the tongue every 5 (five) minutes as needed for chest pain.   25 tablet   1   . WARFARIN SODIUM 5 MG PO TABS   Oral   Take 2.5-5 mg by mouth daily. Take 2.5mg  on tues and thurs, and 5mg  on all other days  BP 120/86  Pulse 96  Temp 99.6 F (37.6 C) (Oral)  Resp 17  Ht 6\' 3"  (1.905 m)  Wt 200 lb (90.719 kg)  BMI 25.00 kg/m2  SpO2 95%  Physical Exam  Nursing note and vitals reviewed. Constitutional: He is oriented to person, place, and time. He appears well-developed and well-nourished. No distress.       Tired appearing.  HENT:  Head: Normocephalic and atraumatic.  Eyes: Conjunctivae normal and EOM are normal.  Neck: Neck supple. No tracheal deviation present.  Cardiovascular: Normal rate, regular rhythm and normal heart sounds.   Pulmonary/Chest: Effort normal. No respiratory distress. He has no wheezes. He has no rales.  Abdominal: He exhibits no  distension.  Musculoskeletal: Normal range of motion.  Neurological: He is alert and oriented to person, place, and time. No sensory deficit.  Skin: Skin is dry. There is pallor.       Slightly pale  Psychiatric: He has a normal mood and affect. His behavior is normal.    ED Course  Procedures (including critical care time) DIAGNOSTIC STUDIES: Oxygen Saturation is 95% on 2L-[place], normal by my interpretation.    COORDINATION OF CARE: 18:41: Provided intent to perform lab work up and to call over to [facility] with intent for possible admittance. Patient and family agreeable.   7:22 PM  D/w Dr. Beverely Pace, cardiology with [facility]- he is accepting the patient for transfer- he will contact supervisor and call us back about a bed assignment   Date: 12/08/2012  Rate: 86  Rhythm: normal sinus rhythm  QRS Axis: normal  Intervals: normal  ST/T Wave abnormalities: nonspecific T wave changes  Conduction Disutrbances:incomplete right bundle branch block  Narrative Interpretation:   Old EKG Reviewed: changes noted incomplete RBBB is new compared to 12/01/11    Labs Reviewed  BASIC METABOLIC PANEL - Abnormal; Notable for the following:    Glucose, Bld 131 (*)     GFR calc non Af Amer 64 (*)     GFR calc Af Amer 74 (*)     All other components within normal limits  CBC  TROPONIN I   Dg Chest Port 1 View  12/08/2012  *RADIOLOGY REPORT*  Clinical Data: Chest pain.  PORTABLE CHEST - 1 VIEW  Comparison: 12/10/2011  Findings: Lungs are hyperinflated.  Heart size is normal.  There is minimal patchy density at the left lung base and to a lesser degree, with mild density in the right lung base, likely representing a new or developing infiltrate.  There are no pleural effusions.  No evidence for pulmonary edema.  Deformity of the distal right clavicle is chronic.  IMPRESSION:  1.  Hyperinflation of the lungs. 2.  Suspect developing bilateral lower lobe infiltrates, left greater than right.    Original Report Authenticated By: Norva Pavlov, M.D.      1. Chest pain       MDM  Pt presenting with chest pain and pressure.  Pain relieved by nitroglycerin in the ED.  D/w [facility]ology for transfer/admission to [facility].  This is at patient's request.  He is agreeable with plan for transfer  I personally performed the services described in this documentation, which was scribed in my presence. The recorded information has been reviewed and is accurate.    Ethelda Chick, MD 12/09/12 1539

## 2012-12-08 NOTE — ED Notes (Signed)
Repeat ECG done and given to EDP Linker, currently at the patients bedside

## 2012-12-08 NOTE — ED Notes (Signed)
EMS called to transport pt to [facility], RN/Charge aware.

## 2012-12-08 NOTE — ED Notes (Signed)
Pt assigned to room 473 @ [facility] per nursing supervisor, Denise,RN notified, [facility] called for transport @ [phone]

## 2012-12-09 NOTE — ED Notes (Signed)
Patient admitted to [facility]

## 2014-09-13 DIAGNOSIS — M19039 Primary osteoarthritis, unspecified wrist: Secondary | ICD-10-CM | POA: Insufficient documentation

## 2014-09-13 DIAGNOSIS — D1803 Hemangioma of intra-abdominal structures: Secondary | ICD-10-CM | POA: Insufficient documentation

## 2014-09-13 DIAGNOSIS — N281 Cyst of kidney, acquired: Secondary | ICD-10-CM | POA: Insufficient documentation

## 2014-09-13 DIAGNOSIS — D7389 Other diseases of spleen: Secondary | ICD-10-CM | POA: Insufficient documentation

## 2014-09-13 DIAGNOSIS — N35919 Unspecified urethral stricture, male, unspecified site: Secondary | ICD-10-CM | POA: Insufficient documentation

## 2014-09-13 DIAGNOSIS — G2581 Restless legs syndrome: Secondary | ICD-10-CM | POA: Insufficient documentation

## 2014-09-13 DIAGNOSIS — K635 Polyp of colon: Secondary | ICD-10-CM | POA: Insufficient documentation

## 2014-09-13 DIAGNOSIS — N529 Male erectile dysfunction, unspecified: Secondary | ICD-10-CM | POA: Insufficient documentation

## 2014-09-13 DIAGNOSIS — M47817 Spondylosis without myelopathy or radiculopathy, lumbosacral region: Secondary | ICD-10-CM | POA: Insufficient documentation

## 2014-09-13 DIAGNOSIS — K5790 Diverticulosis of intestine, part unspecified, without perforation or abscess without bleeding: Secondary | ICD-10-CM | POA: Insufficient documentation

## 2014-09-13 DIAGNOSIS — F419 Anxiety disorder, unspecified: Secondary | ICD-10-CM | POA: Insufficient documentation

## 2014-09-13 DIAGNOSIS — N472 Paraphimosis: Secondary | ICD-10-CM | POA: Insufficient documentation

## 2014-09-13 DIAGNOSIS — M47812 Spondylosis without myelopathy or radiculopathy, cervical region: Secondary | ICD-10-CM | POA: Insufficient documentation

## 2015-12-18 DIAGNOSIS — Z7901 Long term (current) use of anticoagulants: Secondary | ICD-10-CM | POA: Insufficient documentation

## 2016-02-02 DIAGNOSIS — B0229 Other postherpetic nervous system involvement: Secondary | ICD-10-CM | POA: Insufficient documentation

## 2016-03-16 ENCOUNTER — Encounter (HOSPITAL_BASED_OUTPATIENT_CLINIC_OR_DEPARTMENT_OTHER): Payer: Self-pay

## 2016-03-16 ENCOUNTER — Emergency Department (HOSPITAL_BASED_OUTPATIENT_CLINIC_OR_DEPARTMENT_OTHER): Payer: Medicare HMO

## 2016-03-16 ENCOUNTER — Emergency Department (HOSPITAL_BASED_OUTPATIENT_CLINIC_OR_DEPARTMENT_OTHER)
Admission: EM | Admit: 2016-03-16 | Discharge: 2016-03-16 | Disposition: A | Discharge status: Home / Self Care | Payer: Medicare HMO | Attending: Emergency Medicine | Admitting: Emergency Medicine

## 2016-03-16 DIAGNOSIS — M25532 Pain in left wrist: Secondary | ICD-10-CM | POA: Diagnosis present

## 2016-03-16 DIAGNOSIS — M1990 Unspecified osteoarthritis, unspecified site: Secondary | ICD-10-CM | POA: Diagnosis not present

## 2016-03-16 DIAGNOSIS — M064 Inflammatory polyarthropathy: Secondary | ICD-10-CM | POA: Diagnosis not present

## 2016-03-16 DIAGNOSIS — Z87891 Personal history of nicotine dependence: Secondary | ICD-10-CM | POA: Diagnosis not present

## 2016-03-16 DIAGNOSIS — Z8679 Personal history of other diseases of the circulatory system: Secondary | ICD-10-CM | POA: Insufficient documentation

## 2016-03-16 LAB — BASIC METABOLIC PANEL
Anion gap: 7 (ref 5–15)
BUN: 18 mg/dL (ref 6–20)
CALCIUM: 9.1 mg/dL (ref 8.9–10.3)
CHLORIDE: 108 mmol/L (ref 101–111)
CO2: 26 mmol/L (ref 22–32)
CREATININE: 1.14 mg/dL (ref 0.61–1.24)
GFR calc Af Amer: >60 mL/min (ref >60)
GLUCOSE: 117 mg/dL — AB (ref 65–99)
Potassium: 4.3 mmol/L (ref 3.5–5.1)
SODIUM: 141 mmol/L (ref 135–145)

## 2016-03-16 LAB — CBC WITH DIFFERENTIAL/PLATELET
BASOS PCT: 2 %
Basophils Absolute: 0.2 10*3/uL — ABNORMAL HIGH (ref 0.0–0.1)
EOS ABS: 0.3 10*3/uL (ref 0.0–0.7)
EOS PCT: 4 %
HCT: 37.3 % — ABNORMAL LOW (ref 39.0–52.0)
Hemoglobin: 12.8 g/dL — ABNORMAL LOW (ref 13.0–17.0)
Lymphocytes Relative: 15 %
Lymphs Abs: 1.2 10*3/uL (ref 0.7–4.0)
MCH: 33.3 pg (ref 26.0–34.0)
MCHC: 34.3 g/dL (ref 30.0–36.0)
MCV: 97.1 fL (ref 78.0–100.0)
MONO ABS: 0.6 10*3/uL (ref 0.1–1.0)
Monocytes Relative: 7 %
NEUTROS ABS: 5.6 10*3/uL (ref 1.7–7.7)
Neutrophils Relative %: 72 %
PLATELETS: 156 10*3/uL (ref 150–400)
RBC: 3.84 MIL/uL — ABNORMAL LOW (ref 4.22–5.81)
RDW: 16.1 % — ABNORMAL HIGH (ref 11.5–15.5)
WBC: 7.9 10*3/uL (ref 4.0–10.5)

## 2016-03-16 LAB — URIC ACID: Uric Acid, Serum: 6 mg/dL (ref 4.4–7.6)

## 2016-03-16 MED ORDER — OXYCODONE-ACETAMINOPHEN 5-325 MG PO TABS
1.0000 | ORAL_TABLET | Freq: Once | ORAL | Status: AC
  Administered 2016-03-16: 1 via ORAL
  Filled 2016-03-16: qty 1

## 2016-03-16 MED ORDER — KETOROLAC TROMETHAMINE 15 MG/ML IJ SOLN
15.0000 mg | Freq: Once | INTRAMUSCULAR | Status: AC
  Administered 2016-03-16: 15 mg via INTRAMUSCULAR
  Filled 2016-03-16: qty 1

## 2016-03-16 MED ORDER — METHYLPREDNISOLONE 4 MG PO TBPK: ORAL_TABLET | ORAL | Status: DC

## 2016-03-16 MED ORDER — OXYCODONE-ACETAMINOPHEN 5-325 MG PO TABS: 1.0000 | ORAL_TABLET | Freq: Four times a day (QID) | ORAL | Status: DC | PRN

## 2016-03-16 NOTE — ED Notes (Signed)
Pt c/o lt wrist pain radiating up to elbow, denies injury

## 2016-03-16 NOTE — Discharge Instructions (Signed)
Calcium Pyrophosphate Deposition   Calcium pyrophosphate deposition (CPPD), which is also called pseudogout, is a type of arthritis that causes pain, swelling, and inflammation in a joint. The joint pain can be severe and may last for days. If it is not treated, the pain may last much longer. Attacks of CPPD may come and go. This condition usually affects one joint at a time. The joints that are affected most commonly are the knees, but this condition can also affect the wrists, elbows, shoulders, or ankles.  CPPD is similar to gout. Both conditions result from the buildup of crystals in the joint. However, CPPD is caused by a type of crystal that is different than the crystals that cause gout.  CAUSES  This condition is caused by the buildup of calcium pyrophosphate dihydrate crystals in the joint. The reason why this buildup occurs is not known. The condition may be passed down from parent to child (hereditary).  RISK FACTORS  This condition is more likely to develop in people who:  · Are over 60 years old.  · Have a family history of the condition.  · Have had joint replacement surgery.  · Have had a recent injury.  · Have certain medical conditions, such as hemophilia, ochronosis, amyloidosis, or hormonal disorders.  · Have low blood magnesium levels.  SYMPTOMS  Symptoms of this condition include:  · Pain in a joint. The pain may:    Be intense and constant.    Come on quickly.    Get worse with movement.    Last from several days to a few weeks.  · Redness, swelling, and warmth at the joint.  · Stiffness of the joint.  DIAGNOSIS  To diagnose this condition, your health care provider will use a needle to remove fluid from the joint. The fluid will be examined under a microscope to check for the crystals that cause CPPD. You may also have imaging tests, such as:  · X-rays.  · Ultrasound.  TREATMENT  There is no way to remove the crystals from the joint and no way to cure this condition. However, treatment can  relieve symptoms and improve joint function. Treatment may include:  · Nonsteroidal anti-inflammatory drugs (NSAIDs) to reduce inflammation and pain.  · Medicines to help prevent attacks.  · Injections of medicine (cortisone) into the joint to reduce pain and swelling.  · Physical therapy to improve joint function.  HOME CARE INSTRUCTIONS  · Take medicines only as directed by your health care provider.  · Rest the affected joints until your symptoms start to go away.  · Keep your affected joints raised (elevated) when possible. This will help to reduce swelling.  · If directed, apply ice to the affected area:    Put ice in a plastic bag.    Place a towel between your skin and the bag.    Leave the ice on for 20 minutes, 2-3 times per day.  · If the painful joint is in your leg, use crutches as directed by your health care provider.  · When your symptoms start to go away, begin to exercise regularly or do physical therapy. Talk with your health care provider or physical therapist about what types of exercise are safe for you. Low-impact exercise may be best. This includes walking, swimming, bicycling, and water aerobics.  · Maintain a healthy weight so your joints do not need to bear more weight than necessary.  SEEK MEDICAL CARE IF:  · You have an increase   in joint pain that is not relieved with medicine.  · Your joint becomes more red, swollen, or stiff.  · You have a fever.  · You have a skin rash.     This information is not intended to replace advice given to you by your health care provider. Make sure you discuss any questions you have with your health care provider.     Document Released: 08/20/2004 Document Revised: 04/14/2015 Document Reviewed: 11/05/2014  Elsevier Interactive Patient Education ©2016 [employer]

## 2016-03-16 NOTE — ED Provider Notes (Signed)
CSN: [id number]     Arrival date & time 03/16/16  0340 History   First MD Initiated Contact with Patient 03/16/16 [phone]     Chief Complaint  Patient presents with  . Wrist Pain     (Consider location/radiation/quality/duration/timing/severity/associated sxs/prior Treatment) HPI  This is a 80 year old male who presents with wrist pain. Patient reports onset of symptoms yesterday. Pain originates in his wrist and sometimes radiates upward. Currently the pain is "11 out of 10." Patient denies any injury. Pain is worse with range of motion. No known history of gout. Patient does have history of arthritis. He has taken 2 Aleve at home with no improvement of his symptoms. Denies any fevers or systemic symptoms.  Past Medical History  Diagnosis Date  . Dysrhythmia   . Arthritis    Past Surgical History  Procedure Laterality Date  . Hernia repair    . Prostate surgery    . Bone spur    . Cholecystectomy    . Joint replacement    . Kidney stones     No family history on file. Social History  Substance Use Topics  . Smoking status: Former Smoker    Quit date: 12/10/1996  . Smokeless tobacco: Never Used  . Alcohol Use: No    Review of Systems  Constitutional: Negative for fever.  Musculoskeletal:       Left wrist pain  Skin: Negative for color change.  Neurological: Negative for weakness and numbness.  All other systems reviewed and are negative.     Allergies  Ciprofloxacin; Sulfa antibiotics; and Tramadol  Home Medications   Prior to Admission medications   Medication Sig Start Date End Date Taking? Authorizing Provider  meloxicam (MOBIC) 7.5 MG tablet Take 15 mg by mouth every 12 (twelve) hours as needed. For pain/inflamation     Historical Provider, MD  methylPREDNISolone (MEDROL DOSEPAK) 4 MG TBPK tablet Take as directed on packet. 03/16/16   [name], MD  nitroGLYCERIN (NITROSTAT) 0.4 MG SL tablet Place 0.4 mg under the tongue every 5 (five) minutes as needed.  For chest pain     Historical Provider, MD  nitroGLYCERIN (NITROSTAT) 0.4 MG SL tablet Place 1 tablet (0.4 mg total) under the tongue every 5 (five) minutes as needed for chest pain. 12/11/11 12/10/12  [name], MD  oxyCODONE-acetaminophen (PERCOCET/ROXICET) 5-325 MG tablet Take 1 tablet by mouth every 6 (six) hours as needed for severe pain. 03/16/16   [name], MD  warfarin (COUMADIN) 5 MG tablet Take 2.5-5 mg by mouth daily. Take 2.5mg  on tues and thurs, and 5mg  on all other days     Historical Provider, MD   BP 160/88 mmHg  Pulse 90  Temp(Src) 97.7 F (36.5 C) (Oral)  Resp 20  Ht 6\' 3"  (1.905 m)  Wt 200 lb (90.719 kg)  BMI 25.00 kg/m2  SpO2 100% Physical Exam  Constitutional: He is oriented to person, place, and time. No distress.  Uncomfortable appearing, no acute distress  HENT:  Head: Normocephalic and atraumatic.  Cardiovascular: Normal rate and regular rhythm.   Pulmonary/Chest: Effort normal. No respiratory distress.  Musculoskeletal: He exhibits no edema.  Focused examination of the left upper extremity reveals pain with range of motion of the left wrist, no significant swelling noted, no overlying skin changes, neurovascularly intact with a 2+ radial pulse  Neurological: He is alert and oriented to person, place, and time.  Skin: Skin is warm and dry.  Psychiatric: He has a normal mood  and affect.  Nursing note and vitals reviewed.   ED Course  Procedures (including critical care time) Labs Review Labs Reviewed  CBC WITH DIFFERENTIAL/PLATELET - Abnormal; Notable for the following:    RBC 3.84 (*)    Hemoglobin 12.8 (*)    HCT 37.3 (*)    RDW 16.1 (*)    Basophils Absolute 0.2 (*)    All other components within normal limits  BASIC METABOLIC PANEL - Abnormal; Notable for the following:    Glucose, Bld 117 (*)    All other components within normal limits  URIC ACID    Imaging Review Dg Wrist Complete Left  03/16/2016  CLINICAL DATA:  Left wrist pain  for 2 days. No known injury. Limited range of motion. EXAM: LEFT WRIST - COMPLETE 3+ VIEW COMPARISON:  None. FINDINGS: No acute fracture or subluxation. Mild radiocarpal joint space narrowing. Joint space narrowing and osteophytes of the thumb carpal metacarpal joint. No osseous erosions bony destructive change. Soft tissue calcification in the region of the triangular fibrocartilage. There is dorsal soft tissue edema. 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 Electronically Signed   By: Jeb Levering M.D.   On: 03/16/2016 04:57   I have personally reviewed and evaluated these images and lab results as part of my medical decision-making.   EKG Interpretation None      MDM   Final diagnoses:  Inflammatory arthritis [facility])    Patient presents with wrist pain. Nontoxic on exam. Afebrile. He is uncomfortable appearing.  Considerations include gout versus pseudogout versus inflammatory arthritis versus less likely septic joint. Patient denies any alcohol history. Basic labwork obtained as well as uric acid and a x-ray. Patient was given Percocet and a small dose of IM Toradol. No evidence of leukocytosis. Uric acid is 6.0. X-ray is concerning for chondrocalcinosis which can be seen in CPPD arthropathy.  Discussed the results with the patient.  Low suspicion for this time for infection. Suspect inflammatory arthritis. Patient is on Coumadin. For this reason, will place on a Medrol dose pack. He did report some improvement of pain with Percocet. We'll also discharged with Percocet. Sports medicine follow-up. Discussed with patient that definitive diagnosis of pseudogout is by arthrocentesis.  Patient encouraged return if he has any new or worsening symptoms.  After history, exam, and medical workup I feel the patient has been appropriately medically screened and is safe for  discharge home. Pertinent diagnoses were discussed with the patient. Patient was given return precautions.     [name], MD 03/16/16 0600

## 2016-06-22 DIAGNOSIS — H9193 Unspecified hearing loss, bilateral: Secondary | ICD-10-CM | POA: Insufficient documentation

## 2016-08-29 DIAGNOSIS — M5481 Occipital neuralgia: Secondary | ICD-10-CM | POA: Insufficient documentation

## 2016-12-15 DIAGNOSIS — M1611 Unilateral primary osteoarthritis, right hip: Secondary | ICD-10-CM | POA: Diagnosis not present

## 2016-12-15 DIAGNOSIS — M47896 Other spondylosis, lumbar region: Secondary | ICD-10-CM | POA: Diagnosis not present

## 2016-12-15 DIAGNOSIS — M7061 Trochanteric bursitis, right hip: Secondary | ICD-10-CM | POA: Diagnosis not present

## 2017-02-21 DIAGNOSIS — Z85828 Personal history of other malignant neoplasm of skin: Secondary | ICD-10-CM | POA: Diagnosis not present

## 2017-02-21 DIAGNOSIS — Z08 Encounter for follow-up examination after completed treatment for malignant neoplasm: Secondary | ICD-10-CM | POA: Diagnosis not present

## 2017-02-21 DIAGNOSIS — L821 Other seborrheic keratosis: Secondary | ICD-10-CM | POA: Diagnosis not present

## 2017-02-21 DIAGNOSIS — L570 Actinic keratosis: Secondary | ICD-10-CM | POA: Diagnosis not present

## 2017-02-21 DIAGNOSIS — C44319 Basal cell carcinoma of skin of other parts of face: Secondary | ICD-10-CM | POA: Diagnosis not present

## 2017-02-26 ENCOUNTER — Emergency Department (HOSPITAL_BASED_OUTPATIENT_CLINIC_OR_DEPARTMENT_OTHER)
Admission: EM | Admit: 2017-02-26 | Discharge: 2017-02-26 | Disposition: A | Discharge status: Home / Self Care | Payer: PPO | Attending: Emergency Medicine | Admitting: Emergency Medicine

## 2017-02-26 ENCOUNTER — Encounter (HOSPITAL_BASED_OUTPATIENT_CLINIC_OR_DEPARTMENT_OTHER): Payer: Self-pay | Admitting: Emergency Medicine

## 2017-02-26 ENCOUNTER — Emergency Department (HOSPITAL_BASED_OUTPATIENT_CLINIC_OR_DEPARTMENT_OTHER): Payer: PPO

## 2017-02-26 DIAGNOSIS — R079 Chest pain, unspecified: Secondary | ICD-10-CM | POA: Diagnosis not present

## 2017-02-26 DIAGNOSIS — I4892 Unspecified atrial flutter: Secondary | ICD-10-CM | POA: Diagnosis not present

## 2017-02-26 DIAGNOSIS — Z87891 Personal history of nicotine dependence: Secondary | ICD-10-CM | POA: Diagnosis not present

## 2017-02-26 DIAGNOSIS — I2510 Atherosclerotic heart disease of native coronary artery without angina pectoris: Secondary | ICD-10-CM | POA: Insufficient documentation

## 2017-02-26 DIAGNOSIS — I10 Essential (primary) hypertension: Secondary | ICD-10-CM | POA: Diagnosis not present

## 2017-02-26 DIAGNOSIS — I481 Persistent atrial fibrillation: Secondary | ICD-10-CM | POA: Diagnosis not present

## 2017-02-26 DIAGNOSIS — I4891 Unspecified atrial fibrillation: Secondary | ICD-10-CM | POA: Insufficient documentation

## 2017-02-26 DIAGNOSIS — Z79899 Other long term (current) drug therapy: Secondary | ICD-10-CM | POA: Diagnosis not present

## 2017-02-26 DIAGNOSIS — R072 Precordial pain: Secondary | ICD-10-CM | POA: Diagnosis not present

## 2017-02-26 DIAGNOSIS — R17 Unspecified jaundice: Secondary | ICD-10-CM | POA: Diagnosis not present

## 2017-02-26 DIAGNOSIS — R0602 Shortness of breath: Secondary | ICD-10-CM | POA: Diagnosis not present

## 2017-02-26 DIAGNOSIS — Z96642 Presence of left artificial hip joint: Secondary | ICD-10-CM | POA: Diagnosis not present

## 2017-02-26 DIAGNOSIS — E860 Dehydration: Secondary | ICD-10-CM | POA: Diagnosis not present

## 2017-02-26 DIAGNOSIS — N179 Acute kidney failure, unspecified: Secondary | ICD-10-CM | POA: Diagnosis not present

## 2017-02-26 HISTORY — DX: Unspecified atrial fibrillation: I48.91

## 2017-02-26 LAB — COMPREHENSIVE METABOLIC PANEL
ALK PHOS: 99 U/L (ref 38–126)
ALT: 14 U/L — ABNORMAL LOW (ref 17–63)
AST: 21 U/L (ref 15–41)
Albumin: 4.6 g/dL (ref 3.5–5.0)
Anion gap: 7 (ref 5–15)
BUN: 19 mg/dL (ref 6–20)
CO2: 25 mmol/L (ref 22–32)
Calcium: 9.2 mg/dL (ref 8.9–10.3)
Chloride: 108 mmol/L (ref 101–111)
Creatinine, Ser: 1.33 mg/dL — ABNORMAL HIGH (ref 0.61–1.24)
GFR calc Af Amer: 57 mL/min — ABNORMAL LOW (ref >60)
GFR, EST NON AFRICAN AMERICAN: 50 mL/min — AB (ref >60)
Glucose, Bld: 108 mg/dL — ABNORMAL HIGH (ref 65–99)
Potassium: 4.1 mmol/L (ref 3.5–5.1)
Sodium: 140 mmol/L (ref 135–145)
TOTAL PROTEIN: 7.5 g/dL (ref 6.5–8.1)
Total Bilirubin: 1.9 mg/dL — ABNORMAL HIGH (ref 0.3–1.2)

## 2017-02-26 LAB — CBC WITH DIFFERENTIAL/PLATELET
BASOS PCT: 1 %
Basophils Absolute: 0.1 10*3/uL (ref 0.0–0.1)
Eosinophils Absolute: 0.1 10*3/uL (ref 0.0–0.7)
Eosinophils Relative: 2 %
HEMATOCRIT: 39.5 % (ref 39.0–52.0)
HEMOGLOBIN: 13.4 g/dL (ref 13.0–17.0)
LYMPHS PCT: 9 %
Lymphs Abs: 0.7 10*3/uL (ref 0.7–4.0)
MCH: 33.2 pg (ref 26.0–34.0)
MCHC: 33.9 g/dL (ref 30.0–36.0)
MCV: 97.8 fL (ref 78.0–100.0)
MONOS PCT: 12 %
Monocytes Absolute: 1 10*3/uL (ref 0.1–1.0)
NEUTROS ABS: 6.3 10*3/uL (ref 1.7–7.7)
NEUTROS PCT: 76 %
Platelets: 275 10*3/uL (ref 150–400)
RBC: 4.04 MIL/uL — ABNORMAL LOW (ref 4.22–5.81)
RDW: 16.9 % — ABNORMAL HIGH (ref 11.5–15.5)
WBC: 8.2 10*3/uL (ref 4.0–10.5)

## 2017-02-26 LAB — TROPONIN I: Troponin I: <0.03 ng/mL (ref <0.03)

## 2017-02-26 LAB — TSH: TSH: 1.602 u[IU]/mL (ref 0.350–4.500)

## 2017-02-26 MED ORDER — SODIUM CHLORIDE 0.9 % IV BOLUS (SEPSIS)
1000.0000 mL | Freq: Once | INTRAVENOUS | Status: AC
  Administered 2017-02-26: 1000 mL via INTRAVENOUS

## 2017-02-26 MED ORDER — METOPROLOL SUCCINATE ER 25 MG PO TB24: 25.0000 mg | ORAL_TABLET | Freq: Every day | ORAL | 0 refills | Status: DC

## 2017-02-26 MED ORDER — FLECAINIDE ACETATE 50 MG PO TABS: 25.0000 mg | ORAL_TABLET | Freq: Two times a day (BID) | ORAL | 0 refills | Status: DC

## 2017-02-26 MED ORDER — DEXTROSE 5 % IV SOLN
5.0000 mg/h | INTRAVENOUS | Status: DC
  Administered 2017-02-26: 5 mg/h via INTRAVENOUS
  Filled 2017-02-26: qty 100

## 2017-02-26 MED ORDER — METOPROLOL TARTRATE 50 MG PO TABS
25.0000 mg | ORAL_TABLET | Freq: Once | ORAL | Status: AC
  Administered 2017-02-26: 25 mg via ORAL
  Filled 2017-02-26: qty 1

## 2017-02-26 MED ORDER — RIVAROXABAN 20 MG PO TABS: 20.0000 mg | ORAL_TABLET | Freq: Every day | ORAL | 0 refills | Status: DC

## 2017-02-26 MED ORDER — VERAPAMIL HCL 2.5 MG/ML IV SOLN
10.0000 mg | Freq: Once | INTRAVENOUS | Status: AC
  Administered 2017-02-26: 10 mg via INTRAVENOUS
  Filled 2017-02-26: qty 4

## 2017-02-26 NOTE — ED Notes (Signed)
ED Provider at bedside. 

## 2017-02-26 NOTE — Discharge Instructions (Signed)
Follow up with your cardiologist.  Return for worsening symptoms.  

## 2017-02-26 NOTE — ED Notes (Signed)
Sat pt on side of bed and pt reported feeling dizzy; BP 80/52 (sitting), HR 98. Laid pt back in bed. MD notified.

## 2017-02-26 NOTE — ED Notes (Signed)
Sat pt on side of bed -- pt reported dizziness (BP 85/57; HR 92). Pt laid back in bed. MD aware (no new orders received at this time).

## 2017-02-26 NOTE — ED Notes (Signed)
[facility] paged for admission

## 2017-02-26 NOTE — ED Triage Notes (Signed)
Chest pain woke pt up early this morning, 8/10, endorses nausea,  SOB, describes as pressure. Sent from [facility].

## 2017-02-26 NOTE — ED Notes (Signed)
ED Provider at bedside talking with pt and family.

## 2017-02-26 NOTE — ED Provider Notes (Signed)
[facility] DEPT [facility] Provider Note   CSN: [id number] Arrival date & time: 02/26/17  1450   By signing my name below, I, Todd Salazar, attest that this documentation has been prepared under the direction and in the presence of Todd Etienne, DO  Electronically Signed: Delton Salazar, ED Scribe. 02/26/17. 3:27 PM.   History   Chief Complaint Chief Complaint  Patient presents with  . Chest Pain    The history is provided by the patient. No language interpreter was used.   HPI Comments:  Todd Salazar is a 81 y.o. male, with a PMHx of atrial fibrillation, CAD and HTN, who presents to the Emergency Department complaining of acute onset, gradually worsening chest pain onset in the AM today. No alleviating factors noted. Pt denies a fever, congestion, a cough, hemoptysis, abdominal pain, nausea, vomiting, diarrhea, recent long distance travel, any recent surgeries and a hx of blood clots. No other complaints noted.   Cardiologist: Dr. [name]  Past Medical History:  Diagnosis Date  . Arthritis   . Atrial fibrillation ([place])   . Dysrhythmia     Patient Active Problem List   Diagnosis Date Noted  . Chest pain at rest 12/11/2011    Class: Acute  . CAD (coronary artery disease), native coronary artery 12/11/2011    Class: Chronic  . HTN (hypertension) 12/11/2011  . Paroxysmal atrial fibrillation ([place]) 12/11/2011    Past Surgical History:  Procedure Laterality Date  . Bone spur    . CHOLECYSTECTOMY    . HERNIA REPAIR    . JOINT REPLACEMENT    . kidney stones    . PROSTATE SURGERY         Home Medications    Prior to Admission medications   Medication Sig Start Date End Date Taking? Authorizing Provider  flecainide (TAMBOCOR) 50 MG tablet Take 0.5 tablets (25 mg total) by mouth 2 (two) times daily. 02/26/17   Todd Etienne, DO  meloxicam (MOBIC) 7.5 MG tablet Take 15 mg by mouth every 12 (twelve) hours as needed. For pain/inflamation     Historical Provider, MD    methylPREDNISolone (MEDROL DOSEPAK) 4 MG TBPK tablet Take as directed on packet. 03/16/16   [name], MD  metoprolol succinate (TOPROL-XL) 25 MG 24 hr tablet Take 1 tablet (25 mg total) by mouth daily. 02/26/17   Todd Etienne, DO  nitroGLYCERIN (NITROSTAT) 0.4 MG SL tablet Place 0.4 mg under the tongue every 5 (five) minutes as needed. For chest pain     Historical Provider, MD  nitroGLYCERIN (NITROSTAT) 0.4 MG SL tablet Place 1 tablet (0.4 mg total) under the tongue every 5 (five) minutes as needed for chest pain. 12/11/11 12/10/12  [name], MD  oxyCODONE-acetaminophen (PERCOCET/ROXICET) 5-325 MG tablet Take 1 tablet by mouth every 6 (six) hours as needed for severe pain. 03/16/16   [name], MD  rivaroxaban (XARELTO) 20 MG TABS tablet Take 1 tablet (20 mg total) by mouth daily with supper. 02/26/17   Todd Etienne, DO  warfarin (COUMADIN) 5 MG tablet Take 2.5-5 mg by mouth daily. Take 2.5mg  on tues and thurs, and 5mg  on all other days     Historical Provider, MD    Family History No family history on file.  Social History Social History  Substance Use Topics  . Smoking status: Former Smoker    Quit date: 12/10/1996  . Smokeless tobacco: Never Used  . Alcohol use No     Allergies   Ciprofloxacin; Sulfa antibiotics; and  Tramadol   Review of Systems Review of Systems  Constitutional: Negative for chills and fever.  HENT: Negative for congestion and facial swelling.   Eyes: Negative for discharge and visual disturbance.  Respiratory: Negative for cough and shortness of breath.   Cardiovascular: Positive for chest pain and leg swelling. Negative for palpitations.  Gastrointestinal: Negative for abdominal pain, diarrhea and vomiting.  Musculoskeletal: Negative for arthralgias and myalgias.  Skin: Negative for color change and rash.  Neurological: Negative for tremors, syncope and headaches.  Psychiatric/Behavioral: Negative for confusion and dysphoric mood.   Physical  Exam Updated Vital Signs BP 93/69   Pulse (!) 101   Temp 97.7 F (36.5 C) (Oral)   Resp 13   SpO2 98%   Physical Exam  Constitutional: He is oriented to person, place, and time. He appears well-developed and well-nourished.  HENT:  Head: Normocephalic and atraumatic.  Eyes: EOM are normal. Pupils are equal, round, and reactive to light.  Neck: Normal range of motion. Neck supple. No JVD present.  Cardiovascular: Regular rhythm.  Tachycardia present.  Exam reveals no gallop and no friction rub.   No murmur heard. Pulmonary/Chest: Effort normal and breath sounds normal. No respiratory distress. He has no wheezes.  Abdominal: He exhibits no distension. There is no rebound and no guarding.  Musculoskeletal: Normal range of motion. He exhibits edema.  lower extremity edema  Neurological: He is alert and oriented to person, place, and time.  Skin: No rash noted. No pallor.  Psychiatric: He has a normal mood and affect. His behavior is normal.  Nursing note and vitals reviewed.    ED Treatments / Results  DIAGNOSTIC STUDIES:  Oxygen Saturation is 100% on [place], normal by my interpretation.    COORDINATION OF CARE:  3:17 PM Discussed treatment plan with pt at bedside and pt agreed to plan.  Labs (all labs ordered are listed, but only abnormal results are displayed) Labs Reviewed  CBC WITH DIFFERENTIAL/PLATELET - Abnormal; Notable for the following:       Result Value   RBC 4.04 (*)    RDW 16.9 (*)    All other components within normal limits  COMPREHENSIVE METABOLIC PANEL - Abnormal; Notable for the following:    Glucose, Bld 108 (*)    Creatinine, Ser 1.33 (*)    ALT 14 (*)    Total Bilirubin 1.9 (*)    GFR calc non Af Amer 50 (*)    GFR calc Af Amer 57 (*)    All other components within normal limits  TROPONIN I  TSH    EKG  EKG Interpretation  Date/Time:  Sunday February 26 2017 14:55:00 EDT Ventricular Rate:  148 PR Interval:    QRS Duration: 92 QT  Interval:  286 QTC Calculation: 449 R Axis:   -18 Text Interpretation:  Supraventricular tachycardia Incomplete right bundle branch block Nonspecific ST and T wave abnormality Abnormal ECG Since last tracing rate faster Confirmed by Kandie Keiper MD, DANIEL [phone]) on 02/26/2017 3:00:42 PM       Radiology Dg Chest 2 View  Result Date: 02/26/2017 CLINICAL DATA:  Shortness of breath and gradually worsening chest pressure. EXAM: CHEST  2 VIEW COMPARISON:  04/26/2015 and chest CT dated 12/09/2015. FINDINGS: Normal sized heart. Hyperexpanded lungs with stable minimally prominent interstitial markings. Biapical pleural and parenchymal scarring is stable. Thoracic spine degenerative changes. IMPRESSION: No acute abnormality.  Stable changes of COPD. Electronically Signed   By: Claudie Revering M.D.   On: 02/26/2017 16:11  Procedures Procedures (including critical care time)  Medications Ordered in ED Medications  diltiazem (CARDIZEM) 100 mg in dextrose 5 % 100 mL (1 mg/mL) infusion (5 mg/hr Intravenous Transfusing/Transfer 02/26/17 2047)  verapamil (ISOPTIN) injection 10 mg (10 mg Intravenous Given 02/26/17 1514)  metoprolol (LOPRESSOR) tablet 25 mg (25 mg Oral Given 02/26/17 1622)  sodium chloride 0.9 % bolus 1,000 mL (0 mLs Intravenous Stopped 02/26/17 1754)     Initial Impression / Assessment and Plan / ED Course  I have reviewed the triage vital signs and the nursing notes.  Pertinent labs & imaging results that were available during my care of the patient were reviewed by me and considered in my medical decision making (see chart for details).     81 yo M With a chief complaint of chest pain shortness of breath. Patient was found to be any fast regular rhythm in the 150s. There are no P waves seen on the EKG. Has concern for atrial flutter with 2-1 block versus supraventricular tachycardia. He was given a dose of verapamil with resulting slower rate into the 70s with atrial fibrillation.  I discussed  this with the cardiologist Dr.Akbar.  Recommended lopressor, blood thinner and follow up in the office.     Patient did well for the first couple hours however started having low blood pressures that did not respond to IV fluid. HR increased throughout stay, started on dilt gtt.    The patients results and plan were reviewed and discussed.   Any x-rays performed were independently reviewed by myself.   Differential diagnosis were considered with the presenting HPI.  Medications  diltiazem (CARDIZEM) 100 mg in dextrose 5 % 100 mL (1 mg/mL) infusion (5 mg/hr Intravenous Transfusing/Transfer 02/26/17 2047)  verapamil (ISOPTIN) injection 10 mg (10 mg Intravenous Given 02/26/17 1514)  metoprolol (LOPRESSOR) tablet 25 mg (25 mg Oral Given 02/26/17 1622)  sodium chloride 0.9 % bolus 1,000 mL (0 mLs Intravenous Stopped 02/26/17 1754)    Vitals:   02/26/17 1845 02/26/17 1900 02/26/17 1930 02/26/17 2030  BP: 90/69 (!) 85/62 95/60 93/69   Pulse: 97 91 (!) 104 (!) 101  Resp: 15 13 13 13   Temp:      TempSrc:      SpO2: 97% 96% 96% 98%    Final diagnoses:  Atrial fibrillation and flutter (HCC)    Admission/ observation were discussed with the admitting physician, patient and/or family and they are comfortable with the plan.    Final Clinical Impressions(s) / ED Diagnoses   Final diagnoses:  Atrial fibrillation and flutter [facility])    New Prescriptions Discharge Medication List as of 02/26/2017  4:02 PM    START taking these medications   Details  flecainide (TAMBOCOR) 50 MG tablet Take 0.5 tablets (25 mg total) by mouth 2 (two) times daily., Starting Sun 02/26/2017, Print    metoprolol succinate (TOPROL-XL) 25 MG 24 hr tablet Take 1 tablet (25 mg total) by mouth daily., Starting Sun 02/26/2017, Print    rivaroxaban (XARELTO) 20 MG TABS tablet Take 1 tablet (20 mg total) by mouth daily with supper., Starting Sun 02/26/2017, Print      I personally performed the services described in this  documentation, which was scribed in my presence. The recorded information has been reviewed and is accurate.      Todd Etienne, DO 02/27/17 0001

## 2017-02-27 DIAGNOSIS — I959 Hypotension, unspecified: Secondary | ICD-10-CM | POA: Diagnosis not present

## 2017-02-27 DIAGNOSIS — F17211 Nicotine dependence, cigarettes, in remission: Secondary | ICD-10-CM | POA: Diagnosis not present

## 2017-02-27 DIAGNOSIS — I481 Persistent atrial fibrillation: Secondary | ICD-10-CM | POA: Diagnosis not present

## 2017-02-27 DIAGNOSIS — N179 Acute kidney failure, unspecified: Secondary | ICD-10-CM | POA: Diagnosis not present

## 2017-02-27 DIAGNOSIS — R000 Tachycardia, unspecified: Secondary | ICD-10-CM | POA: Diagnosis not present

## 2017-02-27 DIAGNOSIS — I4891 Unspecified atrial fibrillation: Secondary | ICD-10-CM | POA: Diagnosis not present

## 2017-02-28 DIAGNOSIS — Z7901 Long term (current) use of anticoagulants: Secondary | ICD-10-CM | POA: Diagnosis not present

## 2017-02-28 DIAGNOSIS — R000 Tachycardia, unspecified: Secondary | ICD-10-CM | POA: Diagnosis not present

## 2017-02-28 DIAGNOSIS — I4891 Unspecified atrial fibrillation: Secondary | ICD-10-CM | POA: Diagnosis not present

## 2017-02-28 DIAGNOSIS — I482 Chronic atrial fibrillation: Secondary | ICD-10-CM | POA: Diagnosis not present

## 2017-02-28 DIAGNOSIS — N179 Acute kidney failure, unspecified: Secondary | ICD-10-CM | POA: Diagnosis not present

## 2017-02-28 DIAGNOSIS — R17 Unspecified jaundice: Secondary | ICD-10-CM | POA: Diagnosis not present

## 2017-02-28 DIAGNOSIS — I501 Left ventricular failure: Secondary | ICD-10-CM | POA: Diagnosis not present

## 2017-03-01 DIAGNOSIS — R001 Bradycardia, unspecified: Secondary | ICD-10-CM | POA: Diagnosis not present

## 2017-03-01 DIAGNOSIS — I4891 Unspecified atrial fibrillation: Secondary | ICD-10-CM | POA: Diagnosis not present

## 2017-03-01 DIAGNOSIS — N179 Acute kidney failure, unspecified: Secondary | ICD-10-CM | POA: Diagnosis not present

## 2017-03-01 DIAGNOSIS — I480 Paroxysmal atrial fibrillation: Secondary | ICD-10-CM | POA: Diagnosis not present

## 2017-03-01 DIAGNOSIS — Z7901 Long term (current) use of anticoagulants: Secondary | ICD-10-CM | POA: Diagnosis not present

## 2017-03-03 DIAGNOSIS — J0100 Acute maxillary sinusitis, unspecified: Secondary | ICD-10-CM | POA: Diagnosis not present

## 2017-03-07 DIAGNOSIS — Z09 Encounter for follow-up examination after completed treatment for conditions other than malignant neoplasm: Secondary | ICD-10-CM | POA: Diagnosis not present

## 2017-03-07 DIAGNOSIS — I480 Paroxysmal atrial fibrillation: Secondary | ICD-10-CM | POA: Diagnosis not present

## 2017-03-24 DIAGNOSIS — I480 Paroxysmal atrial fibrillation: Secondary | ICD-10-CM | POA: Diagnosis not present

## 2017-03-30 DIAGNOSIS — C61 Malignant neoplasm of prostate: Secondary | ICD-10-CM | POA: Diagnosis not present

## 2017-04-06 DIAGNOSIS — C61 Malignant neoplasm of prostate: Secondary | ICD-10-CM | POA: Diagnosis not present

## 2017-04-06 DIAGNOSIS — N200 Calculus of kidney: Secondary | ICD-10-CM | POA: Diagnosis not present

## 2017-05-18 DIAGNOSIS — M1611 Unilateral primary osteoarthritis, right hip: Secondary | ICD-10-CM | POA: Diagnosis not present

## 2017-06-29 DIAGNOSIS — I480 Paroxysmal atrial fibrillation: Secondary | ICD-10-CM | POA: Diagnosis not present

## 2017-06-29 DIAGNOSIS — J449 Chronic obstructive pulmonary disease, unspecified: Secondary | ICD-10-CM | POA: Diagnosis not present

## 2017-06-29 DIAGNOSIS — I2510 Atherosclerotic heart disease of native coronary artery without angina pectoris: Secondary | ICD-10-CM | POA: Diagnosis not present

## 2017-06-29 DIAGNOSIS — M1611 Unilateral primary osteoarthritis, right hip: Secondary | ICD-10-CM | POA: Diagnosis not present

## 2017-07-10 DIAGNOSIS — R2681 Unsteadiness on feet: Secondary | ICD-10-CM | POA: Diagnosis not present

## 2017-07-10 DIAGNOSIS — N189 Chronic kidney disease, unspecified: Secondary | ICD-10-CM | POA: Diagnosis not present

## 2017-07-10 DIAGNOSIS — Z96641 Presence of right artificial hip joint: Secondary | ICD-10-CM | POA: Diagnosis not present

## 2017-07-10 DIAGNOSIS — I4891 Unspecified atrial fibrillation: Secondary | ICD-10-CM | POA: Diagnosis not present

## 2017-07-10 DIAGNOSIS — R278 Other lack of coordination: Secondary | ICD-10-CM | POA: Diagnosis not present

## 2017-07-10 DIAGNOSIS — Z8546 Personal history of malignant neoplasm of prostate: Secondary | ICD-10-CM | POA: Diagnosis not present

## 2017-07-10 DIAGNOSIS — Z96642 Presence of left artificial hip joint: Secondary | ICD-10-CM | POA: Diagnosis not present

## 2017-07-10 DIAGNOSIS — Z4732 Aftercare following explantation of hip joint prosthesis: Secondary | ICD-10-CM | POA: Diagnosis not present

## 2017-07-10 DIAGNOSIS — M79604 Pain in right leg: Secondary | ICD-10-CM | POA: Diagnosis not present

## 2017-07-10 DIAGNOSIS — M1611 Unilateral primary osteoarthritis, right hip: Secondary | ICD-10-CM | POA: Diagnosis not present

## 2017-07-12 DIAGNOSIS — R278 Other lack of coordination: Secondary | ICD-10-CM | POA: Diagnosis not present

## 2017-07-12 DIAGNOSIS — I480 Paroxysmal atrial fibrillation: Secondary | ICD-10-CM | POA: Diagnosis not present

## 2017-07-12 DIAGNOSIS — R52 Pain, unspecified: Secondary | ICD-10-CM | POA: Diagnosis not present

## 2017-07-12 DIAGNOSIS — M791 Myalgia: Secondary | ICD-10-CM | POA: Diagnosis not present

## 2017-07-12 DIAGNOSIS — Z96642 Presence of left artificial hip joint: Secondary | ICD-10-CM | POA: Diagnosis not present

## 2017-07-12 DIAGNOSIS — Z4732 Aftercare following explantation of hip joint prosthesis: Secondary | ICD-10-CM | POA: Diagnosis not present

## 2017-07-12 DIAGNOSIS — Z9889 Other specified postprocedural states: Secondary | ICD-10-CM | POA: Diagnosis not present

## 2017-07-12 DIAGNOSIS — K5909 Other constipation: Secondary | ICD-10-CM | POA: Diagnosis not present

## 2017-07-12 DIAGNOSIS — N189 Chronic kidney disease, unspecified: Secondary | ICD-10-CM | POA: Diagnosis not present

## 2017-07-12 DIAGNOSIS — I4891 Unspecified atrial fibrillation: Secondary | ICD-10-CM | POA: Diagnosis not present

## 2017-07-12 DIAGNOSIS — M79604 Pain in right leg: Secondary | ICD-10-CM | POA: Diagnosis not present

## 2017-07-12 DIAGNOSIS — D6489 Other specified anemias: Secondary | ICD-10-CM | POA: Diagnosis not present

## 2017-07-12 DIAGNOSIS — R102 Pelvic and perineal pain: Secondary | ICD-10-CM | POA: Diagnosis not present

## 2017-07-12 DIAGNOSIS — Z885 Allergy status to narcotic agent status: Secondary | ICD-10-CM | POA: Diagnosis not present

## 2017-07-12 DIAGNOSIS — Z471 Aftercare following joint replacement surgery: Secondary | ICD-10-CM | POA: Diagnosis not present

## 2017-07-12 DIAGNOSIS — Z96641 Presence of right artificial hip joint: Secondary | ICD-10-CM | POA: Diagnosis not present

## 2017-07-12 DIAGNOSIS — Z882 Allergy status to sulfonamides status: Secondary | ICD-10-CM | POA: Diagnosis not present

## 2017-07-12 DIAGNOSIS — M25551 Pain in right hip: Secondary | ICD-10-CM | POA: Diagnosis not present

## 2017-07-12 DIAGNOSIS — Z79899 Other long term (current) drug therapy: Secondary | ICD-10-CM | POA: Diagnosis not present

## 2017-07-12 DIAGNOSIS — R2681 Unsteadiness on feet: Secondary | ICD-10-CM | POA: Diagnosis not present

## 2017-07-12 DIAGNOSIS — Z87891 Personal history of nicotine dependence: Secondary | ICD-10-CM | POA: Diagnosis not present

## 2017-07-15 DIAGNOSIS — Z471 Aftercare following joint replacement surgery: Secondary | ICD-10-CM | POA: Diagnosis not present

## 2017-07-15 DIAGNOSIS — Z9889 Other specified postprocedural states: Secondary | ICD-10-CM | POA: Diagnosis not present

## 2017-07-15 DIAGNOSIS — Z87891 Personal history of nicotine dependence: Secondary | ICD-10-CM | POA: Diagnosis not present

## 2017-07-15 DIAGNOSIS — M791 Myalgia: Secondary | ICD-10-CM | POA: Diagnosis not present

## 2017-07-15 DIAGNOSIS — Z79899 Other long term (current) drug therapy: Secondary | ICD-10-CM | POA: Diagnosis not present

## 2017-07-15 DIAGNOSIS — I4891 Unspecified atrial fibrillation: Secondary | ICD-10-CM | POA: Diagnosis not present

## 2017-07-15 DIAGNOSIS — R102 Pelvic and perineal pain: Secondary | ICD-10-CM | POA: Diagnosis not present

## 2017-07-15 DIAGNOSIS — M25551 Pain in right hip: Secondary | ICD-10-CM | POA: Diagnosis not present

## 2017-07-15 DIAGNOSIS — Z882 Allergy status to sulfonamides status: Secondary | ICD-10-CM | POA: Diagnosis not present

## 2017-07-15 DIAGNOSIS — Z885 Allergy status to narcotic agent status: Secondary | ICD-10-CM | POA: Diagnosis not present

## 2017-07-15 DIAGNOSIS — Z96641 Presence of right artificial hip joint: Secondary | ICD-10-CM | POA: Diagnosis not present

## 2017-07-15 DIAGNOSIS — N189 Chronic kidney disease, unspecified: Secondary | ICD-10-CM | POA: Diagnosis not present

## 2017-07-15 DIAGNOSIS — M79604 Pain in right leg: Secondary | ICD-10-CM | POA: Diagnosis not present

## 2017-07-16 DIAGNOSIS — I480 Paroxysmal atrial fibrillation: Secondary | ICD-10-CM | POA: Diagnosis not present

## 2017-07-16 DIAGNOSIS — Z96641 Presence of right artificial hip joint: Secondary | ICD-10-CM | POA: Diagnosis not present

## 2017-07-16 DIAGNOSIS — D6489 Other specified anemias: Secondary | ICD-10-CM | POA: Diagnosis not present

## 2017-07-16 DIAGNOSIS — K5909 Other constipation: Secondary | ICD-10-CM | POA: Diagnosis not present

## 2017-08-03 ENCOUNTER — Other Ambulatory Visit: Payer: Self-pay

## 2017-08-03 DIAGNOSIS — N200 Calculus of kidney: Secondary | ICD-10-CM | POA: Diagnosis not present

## 2017-08-03 DIAGNOSIS — G2581 Restless legs syndrome: Secondary | ICD-10-CM | POA: Diagnosis not present

## 2017-08-03 DIAGNOSIS — D509 Iron deficiency anemia, unspecified: Secondary | ICD-10-CM | POA: Diagnosis not present

## 2017-08-03 DIAGNOSIS — Z8546 Personal history of malignant neoplasm of prostate: Secondary | ICD-10-CM | POA: Diagnosis not present

## 2017-08-03 DIAGNOSIS — F419 Anxiety disorder, unspecified: Secondary | ICD-10-CM | POA: Diagnosis not present

## 2017-08-03 DIAGNOSIS — I480 Paroxysmal atrial fibrillation: Secondary | ICD-10-CM | POA: Diagnosis not present

## 2017-08-03 DIAGNOSIS — Z96643 Presence of artificial hip joint, bilateral: Secondary | ICD-10-CM | POA: Diagnosis not present

## 2017-08-03 DIAGNOSIS — Z87891 Personal history of nicotine dependence: Secondary | ICD-10-CM | POA: Diagnosis not present

## 2017-08-03 DIAGNOSIS — Z471 Aftercare following joint replacement surgery: Secondary | ICD-10-CM | POA: Diagnosis not present

## 2017-08-03 DIAGNOSIS — N189 Chronic kidney disease, unspecified: Secondary | ICD-10-CM | POA: Diagnosis not present

## 2017-08-03 DIAGNOSIS — Z7901 Long term (current) use of anticoagulants: Secondary | ICD-10-CM | POA: Diagnosis not present

## 2017-08-03 DIAGNOSIS — Z9181 History of falling: Secondary | ICD-10-CM | POA: Diagnosis not present

## 2017-08-03 DIAGNOSIS — I2510 Atherosclerotic heart disease of native coronary artery without angina pectoris: Secondary | ICD-10-CM | POA: Diagnosis not present

## 2017-08-03 NOTE — Patient Outreach (Signed)
Patient noted primary MD is:  Rubie Maid of [facility] in [place].  Will update the Care management assistant.  Tomasa Rand, RN, BSN, CEN [facility] [employer] [phone]

## 2017-08-03 NOTE — Patient Outreach (Signed)
Transition of care: New referral from [facility] as patient recently discharged from outlying facility.   Placed call to patient and explained reason for call.  Reviewed recent right hip replacement on 07/10/2017. Discharged to rehab on 8/3.  Discharged home from rehab on 07/30/2017.   Patient reports that he is doing well. Declines any problems at this time.  Reports that he has his follow up planned with MD. Reports that he takes his medications as prescribed without problems.  Reports no transportation concerns. Offered home visit for assessment of needs and patient has agreed.  PLAN: home visit for 8/29 at 9:30 am. Confirmed address. Provided my contact information.  Tomasa Rand, RN, BSN, CEN [facility] [employer] [phone]

## 2017-08-08 DIAGNOSIS — N200 Calculus of kidney: Secondary | ICD-10-CM | POA: Diagnosis not present

## 2017-08-08 DIAGNOSIS — Z87891 Personal history of nicotine dependence: Secondary | ICD-10-CM | POA: Diagnosis not present

## 2017-08-08 DIAGNOSIS — Z9181 History of falling: Secondary | ICD-10-CM | POA: Diagnosis not present

## 2017-08-08 DIAGNOSIS — G2581 Restless legs syndrome: Secondary | ICD-10-CM | POA: Diagnosis not present

## 2017-08-08 DIAGNOSIS — Z471 Aftercare following joint replacement surgery: Secondary | ICD-10-CM | POA: Diagnosis not present

## 2017-08-08 DIAGNOSIS — Z96643 Presence of artificial hip joint, bilateral: Secondary | ICD-10-CM | POA: Diagnosis not present

## 2017-08-08 DIAGNOSIS — Z7901 Long term (current) use of anticoagulants: Secondary | ICD-10-CM | POA: Diagnosis not present

## 2017-08-08 DIAGNOSIS — D509 Iron deficiency anemia, unspecified: Secondary | ICD-10-CM | POA: Diagnosis not present

## 2017-08-08 DIAGNOSIS — I2510 Atherosclerotic heart disease of native coronary artery without angina pectoris: Secondary | ICD-10-CM | POA: Diagnosis not present

## 2017-08-08 DIAGNOSIS — F419 Anxiety disorder, unspecified: Secondary | ICD-10-CM | POA: Diagnosis not present

## 2017-08-08 DIAGNOSIS — I480 Paroxysmal atrial fibrillation: Secondary | ICD-10-CM | POA: Diagnosis not present

## 2017-08-08 DIAGNOSIS — N189 Chronic kidney disease, unspecified: Secondary | ICD-10-CM | POA: Diagnosis not present

## 2017-08-08 DIAGNOSIS — Z8546 Personal history of malignant neoplasm of prostate: Secondary | ICD-10-CM | POA: Diagnosis not present

## 2017-08-09 ENCOUNTER — Other Ambulatory Visit: Payer: Self-pay

## 2017-08-09 NOTE — Patient Outreach (Signed)
[facility]) Care Management   08/09/2017  Todd Salazar July 29, 1936 [id number]  Todd Salazar is an 81 y.o. male Arirved to find patient sitting on front porch.  Subjective: Patient reports that he is doing really well after his hip surgery. Patient reports that he is exercising and managing well at home.  Denies any problems. Patient is active with [facility] for PT.  Reports that he does not feel like he needs this any longer. Lives alone but has good family support.  Objective:  Patient is awake and alert. NAD. Patient is able to walk without walker.  Vitals:   08/09/17 0944  BP: 104/60  Pulse: 65  Resp: 16  SpO2: 93%  Weight: 187 lb (84.8 kg)  Height: 1.905 m (6\' 3" )   Review of Systems  Constitutional: Negative.   HENT: Positive for hearing loss.   Eyes:       Wears reading glasses  Respiratory: Negative.   Cardiovascular: Negative.   Gastrointestinal: Negative.   Genitourinary: Negative.   Musculoskeletal:       Reports minimal pain in the right hip  Skin: Negative.   Neurological: Negative.   Endo/Heme/Allergies: Bruises/bleeds easily.  Psychiatric/Behavioral: Negative.     Physical Exam  Constitutional: He is oriented to person, place, and time. He appears well-developed and well-nourished.  Cardiovascular: Normal rate, normal heart sounds and intact distal pulses.   Respiratory: Effort normal and breath sounds normal.  GI: Soft. Bowel sounds are normal.  Musculoskeletal: Normal range of motion. He exhibits no edema.  Neurological: He is alert and oriented to person, place, and time.  Skin: Skin is warm and dry.  Psychiatric: He has a normal mood and affect. His behavior is normal. Judgment and thought content normal.    Encounter Medications:   Outpatient Encounter Prescriptions as of 08/09/2017  Medication Sig  . apixaban (ELIQUIS) 5 MG TABS tablet Take 5 mg by mouth 2 (two) times daily.  . ferrous sulfate 325 (65 FE) MG tablet Take 325  mg by mouth daily with breakfast.  . sotalol (BETAPACE) 80 MG tablet Take 80 mg by mouth 2 (two) times daily.  . [DISCONTINUED] metoprolol succinate (TOPROL-XL) 25 MG 24 hr tablet Take 1 tablet (25 mg total) by mouth daily.  . [DISCONTINUED] flecainide (TAMBOCOR) 50 MG tablet Take 0.5 tablets (25 mg total) by mouth 2 (two) times daily. (Patient not taking: Reported on 08/09/2017)  . [DISCONTINUED] meloxicam (MOBIC) 7.5 MG tablet Take 15 mg by mouth every 12 (twelve) hours as needed. For pain/inflamation   . [DISCONTINUED] methylPREDNISolone (MEDROL DOSEPAK) 4 MG TBPK tablet Take as directed on packet. (Patient not taking: Reported on 08/09/2017)  . [DISCONTINUED] nitroGLYCERIN (NITROSTAT) 0.4 MG SL tablet Place 0.4 mg under the tongue every 5 (five) minutes as needed. For chest pain   . [DISCONTINUED] nitroGLYCERIN (NITROSTAT) 0.4 MG SL tablet Place 1 tablet (0.4 mg total) under the tongue every 5 (five) minutes as needed for chest pain.  . [DISCONTINUED] oxyCODONE-acetaminophen (PERCOCET/ROXICET) 5-325 MG tablet Take 1 tablet by mouth every 6 (six) hours as needed for severe pain.  . [DISCONTINUED] rivaroxaban (XARELTO) 20 MG TABS tablet Take 1 tablet (20 mg total) by mouth daily with supper.  . [DISCONTINUED] warfarin (COUMADIN) 5 MG tablet Take 2.5-5 mg by mouth daily. Take 2.5mg  on tues and thurs, and 5mg  on all other days    No facility-administered encounter medications on file as of 08/09/2017.     Functional Status:   In your  present state of health, do you have any difficulty performing the following activities: 08/09/2017  Hearing? Y  Vision? Y  Comment reading glasses  Difficulty concentrating or making decisions? N  Walking or climbing stairs? N  Dressing or bathing? N  Doing errands, shopping? N  Preparing Food and eating ? N  Using the Toilet? N  In the past six months, have you accidently leaked urine? N  Do you have problems with loss of bowel control? N  Managing your  Medications? N  Managing your Finances? N  Housekeeping or managing your Housekeeping? N  Some recent data might be hidden    Fall/Depression Screening:    Fall Risk  08/09/2017  Falls in the past year? No   PHQ 2/9 Scores 08/09/2017  PHQ - 2 Score 0    Assessment:   (1) reviewed [facility] care management program. Provided a new patient packet.  Reviewed [facility] calendar. Provided magnet, my contact information and welcome letter. Reviewed consent and consent obtained. (2) recent right hip surgery. (3) no advanced directives and declines interest. (4) active with [facility]. (5) only takes 3 medications and denies any problems with medications (6) past due to wellness exam  Plan:  (1) consent scanned into chart (2) encouraged patient to observe fall precautions and post hip surgery precautions. Patient using elevated toilet seat. (3) declines advanced directives. (4) encouraged patient to continue exercises provided by home health (5) reviewed all medications and patient is taking his medications as prescribed. (6) encouraged patient to make an appointment with primary MD for exam.  Care planning and goal setting and primary goal is to avoid readmission. Next outreach in 1 week. Encouraged patient to call sooner if needed This note sent to MD.  [facility] CM Care Plan Problem One     Most Recent Value  Care Plan Problem One  Recent hospital admission for right hip replacement.  Role Documenting the Problem One  Care Management [place] for Problem One  Active  [facility] Long Term Goal   Patient will report no readmissions in the next 60 days.   [facility] Long Term Goal Start Date  08/03/17  Interventions for Problem One Long Term Goal  home visit completed. [facility] packet provided.   [facility] CM Short Term Goal #1   Patient will report no falls in the next 30 days.  [facility] CM Short Term Goal #1 Start Date  08/03/17  Interventions for Short Term Goal #1  Enouraged patient to avoid  falls.    [facility] CM Short Term Goal #2   Patient will report taking all medications as prescribed in the next 30 days.  [facility] CM Short Term Goal #2 Start Date  08/03/17  Interventions for Short Term Goal #2  reviewed medications and purpose for taking each one.       Tomasa Rand, RN, BSN, CEN [facility] [employer] [phone]

## 2017-08-11 DIAGNOSIS — Z96641 Presence of right artificial hip joint: Secondary | ICD-10-CM | POA: Diagnosis not present

## 2017-08-11 DIAGNOSIS — Z471 Aftercare following joint replacement surgery: Secondary | ICD-10-CM | POA: Diagnosis not present

## 2017-08-16 DIAGNOSIS — F419 Anxiety disorder, unspecified: Secondary | ICD-10-CM | POA: Diagnosis not present

## 2017-08-16 DIAGNOSIS — I480 Paroxysmal atrial fibrillation: Secondary | ICD-10-CM | POA: Diagnosis not present

## 2017-08-16 DIAGNOSIS — Z471 Aftercare following joint replacement surgery: Secondary | ICD-10-CM | POA: Diagnosis not present

## 2017-08-16 DIAGNOSIS — D509 Iron deficiency anemia, unspecified: Secondary | ICD-10-CM | POA: Diagnosis not present

## 2017-08-16 DIAGNOSIS — N189 Chronic kidney disease, unspecified: Secondary | ICD-10-CM | POA: Diagnosis not present

## 2017-08-16 DIAGNOSIS — Z96643 Presence of artificial hip joint, bilateral: Secondary | ICD-10-CM | POA: Diagnosis not present

## 2017-08-16 DIAGNOSIS — Z96641 Presence of right artificial hip joint: Secondary | ICD-10-CM | POA: Diagnosis not present

## 2017-08-16 DIAGNOSIS — Z9181 History of falling: Secondary | ICD-10-CM | POA: Diagnosis not present

## 2017-08-16 DIAGNOSIS — I2510 Atherosclerotic heart disease of native coronary artery without angina pectoris: Secondary | ICD-10-CM | POA: Diagnosis not present

## 2017-08-16 DIAGNOSIS — Z8546 Personal history of malignant neoplasm of prostate: Secondary | ICD-10-CM | POA: Diagnosis not present

## 2017-08-16 DIAGNOSIS — G2581 Restless legs syndrome: Secondary | ICD-10-CM | POA: Diagnosis not present

## 2017-08-16 DIAGNOSIS — Z7901 Long term (current) use of anticoagulants: Secondary | ICD-10-CM | POA: Diagnosis not present

## 2017-08-16 DIAGNOSIS — Z87891 Personal history of nicotine dependence: Secondary | ICD-10-CM | POA: Diagnosis not present

## 2017-08-16 DIAGNOSIS — Z23 Encounter for immunization: Secondary | ICD-10-CM | POA: Diagnosis not present

## 2017-08-16 DIAGNOSIS — N200 Calculus of kidney: Secondary | ICD-10-CM | POA: Diagnosis not present

## 2017-08-17 ENCOUNTER — Other Ambulatory Visit: Payer: Self-pay

## 2017-08-17 NOTE — Patient Outreach (Signed)
Transition of care: Placed call to patient who answered and identified himself.  Patient reports that he is doing "great". Reports that he saw his primary Md and everything was fine.   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  Denies any pain.  Denies any new problems or concerns.  PLAN: Will continue weekly outreach attempts for transition of care. Encouraged patient to call sooner if needed.  Tomasa Rand, RN, BSN, CEN [facility] [employer] [phone]

## 2017-08-24 ENCOUNTER — Other Ambulatory Visit: Payer: Self-pay

## 2017-08-24 DIAGNOSIS — Z08 Encounter for follow-up examination after completed treatment for malignant neoplasm: Secondary | ICD-10-CM | POA: Diagnosis not present

## 2017-08-24 DIAGNOSIS — C44319 Basal cell carcinoma of skin of other parts of face: Secondary | ICD-10-CM | POA: Diagnosis not present

## 2017-08-24 DIAGNOSIS — L820 Inflamed seborrheic keratosis: Secondary | ICD-10-CM | POA: Diagnosis not present

## 2017-08-24 DIAGNOSIS — Z85828 Personal history of other malignant neoplasm of skin: Secondary | ICD-10-CM | POA: Diagnosis not present

## 2017-08-24 DIAGNOSIS — L570 Actinic keratosis: Secondary | ICD-10-CM | POA: Diagnosis not present

## 2017-08-24 DIAGNOSIS — D485 Neoplasm of uncertain behavior of skin: Secondary | ICD-10-CM | POA: Diagnosis not present

## 2017-08-24 DIAGNOSIS — L821 Other seborrheic keratosis: Secondary | ICD-10-CM | POA: Diagnosis not present

## 2017-08-24 NOTE — Patient Outreach (Signed)
Transition of care: Placed call to patient who answered. Patient reports that he is doing well. Reports no problems with ambulation. Denies any new concerns.  PLAN: Will continue weekly transition of care calls.  Tomasa Rand, RN, BSN, CEN [facility] [employer] [phone]

## 2017-08-30 ENCOUNTER — Other Ambulatory Visit: Payer: Self-pay

## 2017-08-30 NOTE — Patient Outreach (Signed)
Transition of care call:  Placed call to patient who reports that he is doing great.  Reports walking well. Denies any problems with medications.  Reports no new concerns.  PLAN: will follow up with patient in 30 days. Encouraged patient to call me sooner if needed.  Tomasa Rand, RN, BSN, CEN [facility] [employer] [phone]

## 2017-10-03 ENCOUNTER — Other Ambulatory Visit: Payer: Self-pay

## 2017-10-03 NOTE — Patient Outreach (Signed)
60 day follow up/ case closure:  Placed call to patient who reports that he is doing well. Reports no problems with hip. Reports walking well.  Reports being able to back on the lawn mower.  Denies any new concerns today.  Reviewed case closure with patient and he is in agreement.  PLAN: Goals met. Case closed. Will send letter to patient and MD.  Tomasa Rand, RN, BSN, CEN [place] [facility] [phone]

## 2017-10-13 DIAGNOSIS — H43812 Vitreous degeneration, left eye: Secondary | ICD-10-CM | POA: Diagnosis not present

## 2017-10-13 DIAGNOSIS — H35373 Puckering of macula, bilateral: Secondary | ICD-10-CM | POA: Diagnosis not present

## 2017-10-13 DIAGNOSIS — H3581 Retinal edema: Secondary | ICD-10-CM | POA: Diagnosis not present

## 2017-11-13 DIAGNOSIS — H6123 Impacted cerumen, bilateral: Secondary | ICD-10-CM | POA: Diagnosis not present

## 2017-12-26 DIAGNOSIS — K5710 Diverticulosis of small intestine without perforation or abscess without bleeding: Secondary | ICD-10-CM | POA: Diagnosis not present

## 2017-12-26 DIAGNOSIS — K5732 Diverticulitis of large intestine without perforation or abscess without bleeding: Secondary | ICD-10-CM | POA: Diagnosis not present

## 2017-12-26 DIAGNOSIS — M79604 Pain in right leg: Secondary | ICD-10-CM | POA: Diagnosis not present

## 2017-12-26 DIAGNOSIS — M79605 Pain in left leg: Secondary | ICD-10-CM | POA: Diagnosis not present

## 2018-01-17 DIAGNOSIS — I2510 Atherosclerotic heart disease of native coronary artery without angina pectoris: Secondary | ICD-10-CM | POA: Diagnosis not present

## 2018-01-17 DIAGNOSIS — I10 Essential (primary) hypertension: Secondary | ICD-10-CM | POA: Diagnosis not present

## 2018-01-17 DIAGNOSIS — I480 Paroxysmal atrial fibrillation: Secondary | ICD-10-CM | POA: Diagnosis not present

## 2018-01-18 DIAGNOSIS — I10 Essential (primary) hypertension: Secondary | ICD-10-CM | POA: Diagnosis not present

## 2018-01-18 DIAGNOSIS — I480 Paroxysmal atrial fibrillation: Secondary | ICD-10-CM | POA: Diagnosis not present

## 2018-01-18 DIAGNOSIS — I2510 Atherosclerotic heart disease of native coronary artery without angina pectoris: Secondary | ICD-10-CM | POA: Diagnosis not present

## 2018-01-26 DIAGNOSIS — J029 Acute pharyngitis, unspecified: Secondary | ICD-10-CM | POA: Diagnosis not present

## 2018-02-06 DIAGNOSIS — L578 Other skin changes due to chronic exposure to nonionizing radiation: Secondary | ICD-10-CM | POA: Diagnosis not present

## 2018-02-06 DIAGNOSIS — C44329 Squamous cell carcinoma of skin of other parts of face: Secondary | ICD-10-CM | POA: Diagnosis not present

## 2018-02-06 DIAGNOSIS — L820 Inflamed seborrheic keratosis: Secondary | ICD-10-CM | POA: Diagnosis not present

## 2018-02-06 DIAGNOSIS — D1801 Hemangioma of skin and subcutaneous tissue: Secondary | ICD-10-CM | POA: Diagnosis not present

## 2018-05-10 DIAGNOSIS — I10 Essential (primary) hypertension: Secondary | ICD-10-CM | POA: Diagnosis not present

## 2018-05-10 DIAGNOSIS — Z0000 Encounter for general adult medical examination without abnormal findings: Secondary | ICD-10-CM | POA: Diagnosis not present

## 2018-05-14 DIAGNOSIS — R7989 Other specified abnormal findings of blood chemistry: Secondary | ICD-10-CM | POA: Diagnosis not present

## 2018-05-14 DIAGNOSIS — Z0000 Encounter for general adult medical examination without abnormal findings: Secondary | ICD-10-CM | POA: Diagnosis not present

## 2018-05-14 DIAGNOSIS — I480 Paroxysmal atrial fibrillation: Secondary | ICD-10-CM | POA: Diagnosis not present

## 2018-05-14 DIAGNOSIS — R10816 Epigastric abdominal tenderness: Secondary | ICD-10-CM | POA: Diagnosis not present

## 2018-05-14 DIAGNOSIS — I10 Essential (primary) hypertension: Secondary | ICD-10-CM | POA: Diagnosis not present

## 2018-05-17 DIAGNOSIS — D649 Anemia, unspecified: Secondary | ICD-10-CM | POA: Diagnosis not present

## 2018-05-17 DIAGNOSIS — R634 Abnormal weight loss: Secondary | ICD-10-CM | POA: Diagnosis not present

## 2018-05-17 DIAGNOSIS — R17 Unspecified jaundice: Secondary | ICD-10-CM | POA: Diagnosis not present

## 2018-05-17 DIAGNOSIS — R1013 Epigastric pain: Secondary | ICD-10-CM | POA: Diagnosis not present

## 2018-05-21 DIAGNOSIS — R109 Unspecified abdominal pain: Secondary | ICD-10-CM | POA: Diagnosis not present

## 2018-05-21 DIAGNOSIS — N281 Cyst of kidney, acquired: Secondary | ICD-10-CM | POA: Diagnosis not present

## 2018-06-06 DIAGNOSIS — K209 Esophagitis, unspecified: Secondary | ICD-10-CM | POA: Diagnosis not present

## 2018-06-06 DIAGNOSIS — K3189 Other diseases of stomach and duodenum: Secondary | ICD-10-CM | POA: Diagnosis not present

## 2018-06-06 DIAGNOSIS — R1013 Epigastric pain: Secondary | ICD-10-CM | POA: Diagnosis not present

## 2018-06-06 DIAGNOSIS — D649 Anemia, unspecified: Secondary | ICD-10-CM | POA: Diagnosis not present

## 2018-06-06 DIAGNOSIS — K210 Gastro-esophageal reflux disease with esophagitis: Secondary | ICD-10-CM | POA: Diagnosis not present

## 2018-06-06 DIAGNOSIS — K200 Eosinophilic esophagitis: Secondary | ICD-10-CM | POA: Diagnosis not present

## 2018-06-06 DIAGNOSIS — R634 Abnormal weight loss: Secondary | ICD-10-CM | POA: Diagnosis not present

## 2018-06-22 DIAGNOSIS — N200 Calculus of kidney: Secondary | ICD-10-CM | POA: Diagnosis not present

## 2018-06-22 DIAGNOSIS — K5790 Diverticulosis of intestine, part unspecified, without perforation or abscess without bleeding: Secondary | ICD-10-CM | POA: Diagnosis not present

## 2018-06-22 DIAGNOSIS — R1013 Epigastric pain: Secondary | ICD-10-CM | POA: Diagnosis not present

## 2018-06-22 DIAGNOSIS — R109 Unspecified abdominal pain: Secondary | ICD-10-CM | POA: Diagnosis not present

## 2018-07-25 DIAGNOSIS — Z96642 Presence of left artificial hip joint: Secondary | ICD-10-CM | POA: Diagnosis not present

## 2018-07-25 DIAGNOSIS — Z96641 Presence of right artificial hip joint: Secondary | ICD-10-CM | POA: Diagnosis not present

## 2018-07-25 DIAGNOSIS — M5416 Radiculopathy, lumbar region: Secondary | ICD-10-CM | POA: Diagnosis not present

## 2018-07-31 DIAGNOSIS — R233 Spontaneous ecchymoses: Secondary | ICD-10-CM | POA: Diagnosis not present

## 2018-07-31 DIAGNOSIS — L219 Seborrheic dermatitis, unspecified: Secondary | ICD-10-CM | POA: Diagnosis not present

## 2018-07-31 DIAGNOSIS — L578 Other skin changes due to chronic exposure to nonionizing radiation: Secondary | ICD-10-CM | POA: Diagnosis not present

## 2018-07-31 DIAGNOSIS — D1801 Hemangioma of skin and subcutaneous tissue: Secondary | ICD-10-CM | POA: Diagnosis not present

## 2018-09-12 DIAGNOSIS — R079 Chest pain, unspecified: Secondary | ICD-10-CM | POA: Diagnosis not present

## 2018-09-12 DIAGNOSIS — I4891 Unspecified atrial fibrillation: Secondary | ICD-10-CM | POA: Diagnosis not present

## 2018-09-12 DIAGNOSIS — R001 Bradycardia, unspecified: Secondary | ICD-10-CM | POA: Diagnosis not present

## 2018-09-20 DIAGNOSIS — Z23 Encounter for immunization: Secondary | ICD-10-CM | POA: Diagnosis not present

## 2018-10-04 DIAGNOSIS — R0789 Other chest pain: Secondary | ICD-10-CM | POA: Diagnosis not present

## 2018-10-04 DIAGNOSIS — R079 Chest pain, unspecified: Secondary | ICD-10-CM | POA: Diagnosis not present

## 2018-10-04 DIAGNOSIS — R072 Precordial pain: Secondary | ICD-10-CM | POA: Diagnosis not present

## 2018-10-04 DIAGNOSIS — R531 Weakness: Secondary | ICD-10-CM | POA: Diagnosis not present

## 2018-10-04 DIAGNOSIS — I491 Atrial premature depolarization: Secondary | ICD-10-CM | POA: Diagnosis not present

## 2018-10-04 DIAGNOSIS — R001 Bradycardia, unspecified: Secondary | ICD-10-CM | POA: Diagnosis not present

## 2018-10-04 DIAGNOSIS — R5383 Other fatigue: Secondary | ICD-10-CM | POA: Diagnosis not present

## 2018-10-04 DIAGNOSIS — R0602 Shortness of breath: Secondary | ICD-10-CM | POA: Diagnosis not present

## 2018-10-04 DIAGNOSIS — Z87891 Personal history of nicotine dependence: Secondary | ICD-10-CM | POA: Diagnosis not present

## 2018-10-04 DIAGNOSIS — I493 Ventricular premature depolarization: Secondary | ICD-10-CM | POA: Diagnosis not present

## 2018-10-09 DIAGNOSIS — S29011A Strain of muscle and tendon of front wall of thorax, initial encounter: Secondary | ICD-10-CM | POA: Diagnosis not present

## 2018-10-09 DIAGNOSIS — Z9889 Other specified postprocedural states: Secondary | ICD-10-CM | POA: Diagnosis not present

## 2018-10-09 DIAGNOSIS — IMO0001 Reserved for inherently not codable concepts without codable children: Secondary | ICD-10-CM | POA: Insufficient documentation

## 2018-10-09 DIAGNOSIS — I509 Heart failure, unspecified: Secondary | ICD-10-CM | POA: Diagnosis not present

## 2018-10-09 DIAGNOSIS — I110 Hypertensive heart disease with heart failure: Secondary | ICD-10-CM | POA: Diagnosis not present

## 2018-10-09 DIAGNOSIS — R911 Solitary pulmonary nodule: Secondary | ICD-10-CM | POA: Diagnosis not present

## 2018-10-09 DIAGNOSIS — I4891 Unspecified atrial fibrillation: Secondary | ICD-10-CM | POA: Diagnosis not present

## 2018-10-09 DIAGNOSIS — Z7901 Long term (current) use of anticoagulants: Secondary | ICD-10-CM | POA: Diagnosis not present

## 2018-10-12 DIAGNOSIS — I5021 Acute systolic (congestive) heart failure: Secondary | ICD-10-CM | POA: Diagnosis not present

## 2018-10-16 DIAGNOSIS — R2689 Other abnormalities of gait and mobility: Secondary | ICD-10-CM | POA: Diagnosis not present

## 2018-10-16 DIAGNOSIS — R0789 Other chest pain: Secondary | ICD-10-CM | POA: Diagnosis not present

## 2018-10-16 DIAGNOSIS — I480 Paroxysmal atrial fibrillation: Secondary | ICD-10-CM | POA: Diagnosis not present

## 2018-10-16 DIAGNOSIS — S29011D Strain of muscle and tendon of front wall of thorax, subsequent encounter: Secondary | ICD-10-CM | POA: Diagnosis not present

## 2018-10-16 DIAGNOSIS — R911 Solitary pulmonary nodule: Secondary | ICD-10-CM | POA: Diagnosis not present

## 2018-10-16 DIAGNOSIS — R0602 Shortness of breath: Secondary | ICD-10-CM | POA: Diagnosis not present

## 2018-10-19 DIAGNOSIS — R0609 Other forms of dyspnea: Secondary | ICD-10-CM | POA: Insufficient documentation

## 2018-10-22 DIAGNOSIS — I499 Cardiac arrhythmia, unspecified: Secondary | ICD-10-CM | POA: Diagnosis not present

## 2018-10-24 DIAGNOSIS — R1013 Epigastric pain: Secondary | ICD-10-CM | POA: Diagnosis not present

## 2018-10-25 DIAGNOSIS — I509 Heart failure, unspecified: Secondary | ICD-10-CM | POA: Diagnosis not present

## 2018-10-25 DIAGNOSIS — I25118 Atherosclerotic heart disease of native coronary artery with other forms of angina pectoris: Secondary | ICD-10-CM | POA: Diagnosis not present

## 2018-10-25 DIAGNOSIS — I2510 Atherosclerotic heart disease of native coronary artery without angina pectoris: Secondary | ICD-10-CM | POA: Diagnosis not present

## 2018-10-25 DIAGNOSIS — R0789 Other chest pain: Secondary | ICD-10-CM | POA: Diagnosis not present

## 2018-10-25 DIAGNOSIS — Z87891 Personal history of nicotine dependence: Secondary | ICD-10-CM | POA: Diagnosis not present

## 2018-10-30 DIAGNOSIS — R911 Solitary pulmonary nodule: Secondary | ICD-10-CM | POA: Diagnosis not present

## 2018-10-30 DIAGNOSIS — R2681 Unsteadiness on feet: Secondary | ICD-10-CM | POA: Diagnosis not present

## 2018-10-30 DIAGNOSIS — M940 Chondrocostal junction syndrome [Tietze]: Secondary | ICD-10-CM | POA: Diagnosis not present

## 2018-10-30 DIAGNOSIS — R0602 Shortness of breath: Secondary | ICD-10-CM | POA: Diagnosis not present

## 2018-10-30 DIAGNOSIS — R0609 Other forms of dyspnea: Secondary | ICD-10-CM | POA: Diagnosis not present

## 2018-10-30 DIAGNOSIS — I951 Orthostatic hypotension: Secondary | ICD-10-CM | POA: Diagnosis not present

## 2018-11-19 DIAGNOSIS — R0602 Shortness of breath: Secondary | ICD-10-CM | POA: Diagnosis not present

## 2018-11-19 DIAGNOSIS — R911 Solitary pulmonary nodule: Secondary | ICD-10-CM | POA: Diagnosis not present

## 2018-11-20 DIAGNOSIS — J984 Other disorders of lung: Secondary | ICD-10-CM | POA: Diagnosis not present

## 2018-11-21 DIAGNOSIS — I480 Paroxysmal atrial fibrillation: Secondary | ICD-10-CM | POA: Diagnosis not present

## 2018-11-21 DIAGNOSIS — H6123 Impacted cerumen, bilateral: Secondary | ICD-10-CM | POA: Diagnosis not present

## 2018-11-21 DIAGNOSIS — R7989 Other specified abnormal findings of blood chemistry: Secondary | ICD-10-CM | POA: Diagnosis not present

## 2018-11-21 DIAGNOSIS — R5383 Other fatigue: Secondary | ICD-10-CM | POA: Diagnosis not present

## 2018-11-21 DIAGNOSIS — D649 Anemia, unspecified: Secondary | ICD-10-CM | POA: Diagnosis not present

## 2018-11-21 DIAGNOSIS — R0602 Shortness of breath: Secondary | ICD-10-CM | POA: Diagnosis not present

## 2018-11-23 DIAGNOSIS — I480 Paroxysmal atrial fibrillation: Secondary | ICD-10-CM | POA: Diagnosis not present

## 2018-11-23 DIAGNOSIS — J984 Other disorders of lung: Secondary | ICD-10-CM | POA: Diagnosis not present

## 2018-11-23 DIAGNOSIS — R0602 Shortness of breath: Secondary | ICD-10-CM | POA: Diagnosis not present

## 2018-12-03 DIAGNOSIS — C44319 Basal cell carcinoma of skin of other parts of face: Secondary | ICD-10-CM | POA: Diagnosis not present

## 2018-12-03 DIAGNOSIS — L299 Pruritus, unspecified: Secondary | ICD-10-CM | POA: Diagnosis not present

## 2018-12-03 DIAGNOSIS — L821 Other seborrheic keratosis: Secondary | ICD-10-CM | POA: Diagnosis not present

## 2018-12-24 DIAGNOSIS — C44319 Basal cell carcinoma of skin of other parts of face: Secondary | ICD-10-CM | POA: Diagnosis not present

## 2019-01-10 DIAGNOSIS — H6242 Otitis externa in other diseases classified elsewhere, left ear: Secondary | ICD-10-CM | POA: Diagnosis not present

## 2019-01-10 DIAGNOSIS — J3489 Other specified disorders of nose and nasal sinuses: Secondary | ICD-10-CM | POA: Diagnosis not present

## 2019-01-10 DIAGNOSIS — Z87891 Personal history of nicotine dependence: Secondary | ICD-10-CM | POA: Diagnosis not present

## 2019-01-10 DIAGNOSIS — H6123 Impacted cerumen, bilateral: Secondary | ICD-10-CM | POA: Diagnosis not present

## 2019-01-10 DIAGNOSIS — B369 Superficial mycosis, unspecified: Secondary | ICD-10-CM | POA: Diagnosis not present

## 2019-01-18 DIAGNOSIS — R0609 Other forms of dyspnea: Secondary | ICD-10-CM | POA: Diagnosis not present

## 2019-01-18 DIAGNOSIS — R7989 Other specified abnormal findings of blood chemistry: Secondary | ICD-10-CM | POA: Diagnosis not present

## 2019-01-18 DIAGNOSIS — I480 Paroxysmal atrial fibrillation: Secondary | ICD-10-CM | POA: Diagnosis not present

## 2019-01-18 DIAGNOSIS — R942 Abnormal results of pulmonary function studies: Secondary | ICD-10-CM | POA: Diagnosis not present

## 2019-02-04 DIAGNOSIS — Z130 Encounter for screening for diseases of the blood and blood-forming organs and certain disorders involving the immune mechanism: Secondary | ICD-10-CM | POA: Diagnosis not present

## 2019-02-04 DIAGNOSIS — R7989 Other specified abnormal findings of blood chemistry: Secondary | ICD-10-CM | POA: Diagnosis not present

## 2019-02-04 DIAGNOSIS — D539 Nutritional anemia, unspecified: Secondary | ICD-10-CM | POA: Diagnosis not present

## 2019-02-06 DIAGNOSIS — R7989 Other specified abnormal findings of blood chemistry: Secondary | ICD-10-CM | POA: Diagnosis not present

## 2019-02-09 DIAGNOSIS — R942 Abnormal results of pulmonary function studies: Secondary | ICD-10-CM | POA: Diagnosis not present

## 2019-02-09 DIAGNOSIS — R0609 Other forms of dyspnea: Secondary | ICD-10-CM | POA: Diagnosis not present

## 2019-02-09 DIAGNOSIS — J479 Bronchiectasis, uncomplicated: Secondary | ICD-10-CM | POA: Diagnosis not present

## 2019-02-19 DIAGNOSIS — D539 Nutritional anemia, unspecified: Secondary | ICD-10-CM | POA: Diagnosis not present

## 2019-02-22 DIAGNOSIS — J479 Bronchiectasis, uncomplicated: Secondary | ICD-10-CM | POA: Insufficient documentation

## 2019-02-22 DIAGNOSIS — R918 Other nonspecific abnormal finding of lung field: Secondary | ICD-10-CM | POA: Diagnosis not present

## 2019-02-22 DIAGNOSIS — J984 Other disorders of lung: Secondary | ICD-10-CM | POA: Diagnosis not present

## 2019-04-24 DIAGNOSIS — Z5181 Encounter for therapeutic drug level monitoring: Secondary | ICD-10-CM | POA: Diagnosis not present

## 2019-04-24 DIAGNOSIS — R7989 Other specified abnormal findings of blood chemistry: Secondary | ICD-10-CM | POA: Diagnosis not present

## 2019-04-24 DIAGNOSIS — G4700 Insomnia, unspecified: Secondary | ICD-10-CM | POA: Diagnosis not present

## 2019-04-24 DIAGNOSIS — J479 Bronchiectasis, uncomplicated: Secondary | ICD-10-CM | POA: Diagnosis not present

## 2019-04-24 DIAGNOSIS — I480 Paroxysmal atrial fibrillation: Secondary | ICD-10-CM | POA: Diagnosis not present

## 2019-04-24 DIAGNOSIS — R918 Other nonspecific abnormal finding of lung field: Secondary | ICD-10-CM | POA: Diagnosis not present

## 2019-04-24 DIAGNOSIS — J984 Other disorders of lung: Secondary | ICD-10-CM | POA: Diagnosis not present

## 2019-04-24 DIAGNOSIS — Z0000 Encounter for general adult medical examination without abnormal findings: Secondary | ICD-10-CM | POA: Diagnosis not present

## 2019-04-24 DIAGNOSIS — R0600 Dyspnea, unspecified: Secondary | ICD-10-CM | POA: Diagnosis not present

## 2019-05-10 DIAGNOSIS — L570 Actinic keratosis: Secondary | ICD-10-CM | POA: Diagnosis not present

## 2019-06-05 DIAGNOSIS — J984 Other disorders of lung: Secondary | ICD-10-CM | POA: Diagnosis not present

## 2019-06-05 DIAGNOSIS — R0602 Shortness of breath: Secondary | ICD-10-CM | POA: Diagnosis not present

## 2019-06-05 DIAGNOSIS — I480 Paroxysmal atrial fibrillation: Secondary | ICD-10-CM | POA: Diagnosis not present

## 2019-06-18 DIAGNOSIS — Z96641 Presence of right artificial hip joint: Secondary | ICD-10-CM | POA: Diagnosis not present

## 2019-07-11 DIAGNOSIS — Z96641 Presence of right artificial hip joint: Secondary | ICD-10-CM | POA: Diagnosis not present

## 2019-07-11 DIAGNOSIS — M7061 Trochanteric bursitis, right hip: Secondary | ICD-10-CM | POA: Diagnosis not present

## 2019-07-25 DIAGNOSIS — M1990 Unspecified osteoarthritis, unspecified site: Secondary | ICD-10-CM | POA: Diagnosis not present

## 2019-07-25 DIAGNOSIS — I480 Paroxysmal atrial fibrillation: Secondary | ICD-10-CM | POA: Diagnosis not present

## 2019-07-25 DIAGNOSIS — E875 Hyperkalemia: Secondary | ICD-10-CM | POA: Diagnosis not present

## 2019-07-25 DIAGNOSIS — Z7901 Long term (current) use of anticoagulants: Secondary | ICD-10-CM | POA: Diagnosis not present

## 2019-07-25 DIAGNOSIS — Z0000 Encounter for general adult medical examination without abnormal findings: Secondary | ICD-10-CM | POA: Diagnosis not present

## 2019-07-25 DIAGNOSIS — J984 Other disorders of lung: Secondary | ICD-10-CM | POA: Diagnosis not present

## 2019-07-25 DIAGNOSIS — Z79899 Other long term (current) drug therapy: Secondary | ICD-10-CM | POA: Diagnosis not present

## 2019-07-25 DIAGNOSIS — S8011XA Contusion of right lower leg, initial encounter: Secondary | ICD-10-CM | POA: Diagnosis not present

## 2019-07-25 DIAGNOSIS — E860 Dehydration: Secondary | ICD-10-CM | POA: Diagnosis not present

## 2019-07-25 DIAGNOSIS — R000 Tachycardia, unspecified: Secondary | ICD-10-CM | POA: Diagnosis not present

## 2019-07-25 DIAGNOSIS — Z7409 Other reduced mobility: Secondary | ICD-10-CM | POA: Diagnosis not present

## 2019-07-25 DIAGNOSIS — Z8546 Personal history of malignant neoplasm of prostate: Secondary | ICD-10-CM | POA: Diagnosis not present

## 2019-07-25 DIAGNOSIS — I4891 Unspecified atrial fibrillation: Secondary | ICD-10-CM | POA: Diagnosis not present

## 2019-07-25 DIAGNOSIS — Y998 Other external cause status: Secondary | ICD-10-CM | POA: Diagnosis not present

## 2019-07-25 DIAGNOSIS — Z5181 Encounter for therapeutic drug level monitoring: Secondary | ICD-10-CM | POA: Diagnosis not present

## 2019-07-25 DIAGNOSIS — R262 Difficulty in walking, not elsewhere classified: Secondary | ICD-10-CM | POA: Diagnosis not present

## 2019-07-25 DIAGNOSIS — J479 Bronchiectasis, uncomplicated: Secondary | ICD-10-CM | POA: Diagnosis not present

## 2019-07-25 DIAGNOSIS — Z96641 Presence of right artificial hip joint: Secondary | ICD-10-CM | POA: Diagnosis not present

## 2019-07-25 DIAGNOSIS — D649 Anemia, unspecified: Secondary | ICD-10-CM | POA: Diagnosis not present

## 2019-07-25 DIAGNOSIS — Z96643 Presence of artificial hip joint, bilateral: Secondary | ICD-10-CM | POA: Diagnosis not present

## 2019-07-25 DIAGNOSIS — R0789 Other chest pain: Secondary | ICD-10-CM | POA: Diagnosis not present

## 2019-07-25 DIAGNOSIS — R911 Solitary pulmonary nodule: Secondary | ICD-10-CM | POA: Diagnosis not present

## 2019-07-25 DIAGNOSIS — X58XXXA Exposure to other specified factors, initial encounter: Secondary | ICD-10-CM | POA: Diagnosis not present

## 2019-07-25 DIAGNOSIS — M48061 Spinal stenosis, lumbar region without neurogenic claudication: Secondary | ICD-10-CM | POA: Diagnosis not present

## 2019-07-25 DIAGNOSIS — D539 Nutritional anemia, unspecified: Secondary | ICD-10-CM | POA: Diagnosis not present

## 2019-07-25 DIAGNOSIS — M25551 Pain in right hip: Secondary | ICD-10-CM | POA: Diagnosis not present

## 2019-07-25 DIAGNOSIS — Z87891 Personal history of nicotine dependence: Secondary | ICD-10-CM | POA: Diagnosis not present

## 2019-07-26 DIAGNOSIS — I493 Ventricular premature depolarization: Secondary | ICD-10-CM | POA: Diagnosis not present

## 2019-07-26 DIAGNOSIS — I4510 Unspecified right bundle-branch block: Secondary | ICD-10-CM | POA: Diagnosis not present

## 2019-07-26 DIAGNOSIS — R9431 Abnormal electrocardiogram [ECG] [EKG]: Secondary | ICD-10-CM | POA: Diagnosis not present

## 2019-07-26 DIAGNOSIS — R000 Tachycardia, unspecified: Secondary | ICD-10-CM | POA: Diagnosis not present

## 2019-07-26 DIAGNOSIS — E875 Hyperkalemia: Secondary | ICD-10-CM | POA: Diagnosis not present

## 2019-07-26 DIAGNOSIS — D539 Nutritional anemia, unspecified: Secondary | ICD-10-CM | POA: Insufficient documentation

## 2019-07-26 DIAGNOSIS — I951 Orthostatic hypotension: Secondary | ICD-10-CM | POA: Diagnosis not present

## 2019-07-26 DIAGNOSIS — R001 Bradycardia, unspecified: Secondary | ICD-10-CM | POA: Diagnosis not present

## 2019-07-26 DIAGNOSIS — I959 Hypotension, unspecified: Secondary | ICD-10-CM | POA: Insufficient documentation

## 2019-07-26 DIAGNOSIS — R072 Precordial pain: Secondary | ICD-10-CM | POA: Diagnosis not present

## 2019-07-26 DIAGNOSIS — M25551 Pain in right hip: Secondary | ICD-10-CM | POA: Diagnosis not present

## 2019-07-27 DIAGNOSIS — M7631 Iliotibial band syndrome, right leg: Secondary | ICD-10-CM | POA: Insufficient documentation

## 2019-07-27 DIAGNOSIS — D539 Nutritional anemia, unspecified: Secondary | ICD-10-CM | POA: Diagnosis not present

## 2019-07-27 DIAGNOSIS — M5126 Other intervertebral disc displacement, lumbar region: Secondary | ICD-10-CM | POA: Diagnosis not present

## 2019-07-27 DIAGNOSIS — M48061 Spinal stenosis, lumbar region without neurogenic claudication: Secondary | ICD-10-CM | POA: Diagnosis not present

## 2019-07-27 DIAGNOSIS — M25551 Pain in right hip: Secondary | ICD-10-CM | POA: Diagnosis not present

## 2019-07-27 DIAGNOSIS — M47816 Spondylosis without myelopathy or radiculopathy, lumbar region: Secondary | ICD-10-CM | POA: Diagnosis not present

## 2019-07-27 DIAGNOSIS — I4891 Unspecified atrial fibrillation: Secondary | ICD-10-CM | POA: Diagnosis not present

## 2019-07-29 DIAGNOSIS — Z7901 Long term (current) use of anticoagulants: Secondary | ICD-10-CM | POA: Diagnosis not present

## 2019-07-29 DIAGNOSIS — J479 Bronchiectasis, uncomplicated: Secondary | ICD-10-CM | POA: Diagnosis not present

## 2019-07-29 DIAGNOSIS — M47817 Spondylosis without myelopathy or radiculopathy, lumbosacral region: Secondary | ICD-10-CM | POA: Diagnosis not present

## 2019-07-29 DIAGNOSIS — I080 Rheumatic disorders of both mitral and aortic valves: Secondary | ICD-10-CM | POA: Diagnosis not present

## 2019-07-29 DIAGNOSIS — I480 Paroxysmal atrial fibrillation: Secondary | ICD-10-CM | POA: Diagnosis not present

## 2019-07-29 DIAGNOSIS — N200 Calculus of kidney: Secondary | ICD-10-CM | POA: Diagnosis not present

## 2019-07-29 DIAGNOSIS — M1990 Unspecified osteoarthritis, unspecified site: Secondary | ICD-10-CM | POA: Diagnosis not present

## 2019-07-29 DIAGNOSIS — D1809 Hemangioma of other sites: Secondary | ICD-10-CM | POA: Diagnosis not present

## 2019-07-29 DIAGNOSIS — M48061 Spinal stenosis, lumbar region without neurogenic claudication: Secondary | ICD-10-CM | POA: Diagnosis not present

## 2019-07-29 DIAGNOSIS — K5790 Diverticulosis of intestine, part unspecified, without perforation or abscess without bleeding: Secondary | ICD-10-CM | POA: Diagnosis not present

## 2019-07-29 DIAGNOSIS — G5620 Lesion of ulnar nerve, unspecified upper limb: Secondary | ICD-10-CM | POA: Diagnosis not present

## 2019-07-29 DIAGNOSIS — M7631 Iliotibial band syndrome, right leg: Secondary | ICD-10-CM | POA: Diagnosis not present

## 2019-07-29 DIAGNOSIS — Q619 Cystic kidney disease, unspecified: Secondary | ICD-10-CM | POA: Diagnosis not present

## 2019-07-29 DIAGNOSIS — D539 Nutritional anemia, unspecified: Secondary | ICD-10-CM | POA: Diagnosis not present

## 2019-07-29 DIAGNOSIS — H9193 Unspecified hearing loss, bilateral: Secondary | ICD-10-CM | POA: Diagnosis not present

## 2019-07-29 DIAGNOSIS — F419 Anxiety disorder, unspecified: Secondary | ICD-10-CM | POA: Diagnosis not present

## 2019-07-29 DIAGNOSIS — M419 Scoliosis, unspecified: Secondary | ICD-10-CM | POA: Diagnosis not present

## 2019-07-29 DIAGNOSIS — M47812 Spondylosis without myelopathy or radiculopathy, cervical region: Secondary | ICD-10-CM | POA: Diagnosis not present

## 2019-07-29 DIAGNOSIS — G629 Polyneuropathy, unspecified: Secondary | ICD-10-CM | POA: Diagnosis not present

## 2019-07-29 DIAGNOSIS — Z8546 Personal history of malignant neoplasm of prostate: Secondary | ICD-10-CM | POA: Diagnosis not present

## 2019-07-29 DIAGNOSIS — N281 Cyst of kidney, acquired: Secondary | ICD-10-CM | POA: Diagnosis not present

## 2019-07-29 DIAGNOSIS — R911 Solitary pulmonary nodule: Secondary | ICD-10-CM | POA: Diagnosis not present

## 2019-07-29 DIAGNOSIS — G2581 Restless legs syndrome: Secondary | ICD-10-CM | POA: Diagnosis not present

## 2019-07-29 DIAGNOSIS — D7389 Other diseases of spleen: Secondary | ICD-10-CM | POA: Diagnosis not present

## 2019-07-29 DIAGNOSIS — J984 Other disorders of lung: Secondary | ICD-10-CM | POA: Diagnosis not present

## 2019-08-01 DIAGNOSIS — Z96643 Presence of artificial hip joint, bilateral: Secondary | ICD-10-CM | POA: Diagnosis not present

## 2019-08-01 DIAGNOSIS — M545 Low back pain: Secondary | ICD-10-CM | POA: Diagnosis not present

## 2019-08-01 DIAGNOSIS — M48061 Spinal stenosis, lumbar region without neurogenic claudication: Secondary | ICD-10-CM | POA: Diagnosis not present

## 2019-08-07 DIAGNOSIS — N281 Cyst of kidney, acquired: Secondary | ICD-10-CM | POA: Diagnosis not present

## 2019-08-07 DIAGNOSIS — M48061 Spinal stenosis, lumbar region without neurogenic claudication: Secondary | ICD-10-CM | POA: Diagnosis not present

## 2019-08-07 DIAGNOSIS — H9193 Unspecified hearing loss, bilateral: Secondary | ICD-10-CM | POA: Diagnosis not present

## 2019-08-07 DIAGNOSIS — J479 Bronchiectasis, uncomplicated: Secondary | ICD-10-CM | POA: Diagnosis not present

## 2019-08-07 DIAGNOSIS — M7631 Iliotibial band syndrome, right leg: Secondary | ICD-10-CM | POA: Diagnosis not present

## 2019-08-07 DIAGNOSIS — N200 Calculus of kidney: Secondary | ICD-10-CM | POA: Diagnosis not present

## 2019-08-07 DIAGNOSIS — I480 Paroxysmal atrial fibrillation: Secondary | ICD-10-CM | POA: Diagnosis not present

## 2019-08-07 DIAGNOSIS — M47817 Spondylosis without myelopathy or radiculopathy, lumbosacral region: Secondary | ICD-10-CM | POA: Diagnosis not present

## 2019-08-07 DIAGNOSIS — M419 Scoliosis, unspecified: Secondary | ICD-10-CM | POA: Diagnosis not present

## 2019-08-07 DIAGNOSIS — F419 Anxiety disorder, unspecified: Secondary | ICD-10-CM | POA: Diagnosis not present

## 2019-08-07 DIAGNOSIS — M47812 Spondylosis without myelopathy or radiculopathy, cervical region: Secondary | ICD-10-CM | POA: Diagnosis not present

## 2019-08-07 DIAGNOSIS — I080 Rheumatic disorders of both mitral and aortic valves: Secondary | ICD-10-CM | POA: Diagnosis not present

## 2019-08-09 DIAGNOSIS — E875 Hyperkalemia: Secondary | ICD-10-CM | POA: Diagnosis not present

## 2019-08-09 DIAGNOSIS — Z5181 Encounter for therapeutic drug level monitoring: Secondary | ICD-10-CM | POA: Diagnosis not present

## 2019-08-09 DIAGNOSIS — D649 Anemia, unspecified: Secondary | ICD-10-CM | POA: Diagnosis not present

## 2019-08-09 DIAGNOSIS — R7989 Other specified abnormal findings of blood chemistry: Secondary | ICD-10-CM | POA: Diagnosis not present

## 2019-08-09 DIAGNOSIS — M4727 Other spondylosis with radiculopathy, lumbosacral region: Secondary | ICD-10-CM | POA: Diagnosis not present

## 2019-08-09 DIAGNOSIS — M25551 Pain in right hip: Secondary | ICD-10-CM | POA: Diagnosis not present

## 2019-08-14 DIAGNOSIS — I480 Paroxysmal atrial fibrillation: Secondary | ICD-10-CM | POA: Diagnosis not present

## 2019-08-14 DIAGNOSIS — G629 Polyneuropathy, unspecified: Secondary | ICD-10-CM | POA: Diagnosis not present

## 2019-08-14 DIAGNOSIS — M47812 Spondylosis without myelopathy or radiculopathy, cervical region: Secondary | ICD-10-CM | POA: Diagnosis not present

## 2019-08-14 DIAGNOSIS — F419 Anxiety disorder, unspecified: Secondary | ICD-10-CM | POA: Diagnosis not present

## 2019-08-14 DIAGNOSIS — M48061 Spinal stenosis, lumbar region without neurogenic claudication: Secondary | ICD-10-CM | POA: Diagnosis not present

## 2019-08-14 DIAGNOSIS — D1809 Hemangioma of other sites: Secondary | ICD-10-CM | POA: Diagnosis not present

## 2019-08-14 DIAGNOSIS — I080 Rheumatic disorders of both mitral and aortic valves: Secondary | ICD-10-CM | POA: Diagnosis not present

## 2019-08-14 DIAGNOSIS — M7631 Iliotibial band syndrome, right leg: Secondary | ICD-10-CM | POA: Diagnosis not present

## 2019-08-14 DIAGNOSIS — Q619 Cystic kidney disease, unspecified: Secondary | ICD-10-CM | POA: Diagnosis not present

## 2019-08-14 DIAGNOSIS — M419 Scoliosis, unspecified: Secondary | ICD-10-CM | POA: Diagnosis not present

## 2019-08-14 DIAGNOSIS — D539 Nutritional anemia, unspecified: Secondary | ICD-10-CM | POA: Diagnosis not present

## 2019-08-14 DIAGNOSIS — K5790 Diverticulosis of intestine, part unspecified, without perforation or abscess without bleeding: Secondary | ICD-10-CM | POA: Diagnosis not present

## 2019-08-14 DIAGNOSIS — R911 Solitary pulmonary nodule: Secondary | ICD-10-CM | POA: Diagnosis not present

## 2019-08-14 DIAGNOSIS — M1990 Unspecified osteoarthritis, unspecified site: Secondary | ICD-10-CM | POA: Diagnosis not present

## 2019-08-14 DIAGNOSIS — J984 Other disorders of lung: Secondary | ICD-10-CM | POA: Diagnosis not present

## 2019-08-14 DIAGNOSIS — M47817 Spondylosis without myelopathy or radiculopathy, lumbosacral region: Secondary | ICD-10-CM | POA: Diagnosis not present

## 2019-08-14 DIAGNOSIS — G2581 Restless legs syndrome: Secondary | ICD-10-CM | POA: Diagnosis not present

## 2019-08-14 DIAGNOSIS — D7389 Other diseases of spleen: Secondary | ICD-10-CM | POA: Diagnosis not present

## 2019-08-14 DIAGNOSIS — J479 Bronchiectasis, uncomplicated: Secondary | ICD-10-CM | POA: Diagnosis not present

## 2019-08-14 DIAGNOSIS — Z8546 Personal history of malignant neoplasm of prostate: Secondary | ICD-10-CM | POA: Diagnosis not present

## 2019-08-14 DIAGNOSIS — Z7901 Long term (current) use of anticoagulants: Secondary | ICD-10-CM | POA: Diagnosis not present

## 2019-08-14 DIAGNOSIS — G5620 Lesion of ulnar nerve, unspecified upper limb: Secondary | ICD-10-CM | POA: Diagnosis not present

## 2019-08-14 DIAGNOSIS — N200 Calculus of kidney: Secondary | ICD-10-CM | POA: Diagnosis not present

## 2019-08-14 DIAGNOSIS — N281 Cyst of kidney, acquired: Secondary | ICD-10-CM | POA: Diagnosis not present

## 2019-08-14 DIAGNOSIS — H9193 Unspecified hearing loss, bilateral: Secondary | ICD-10-CM | POA: Diagnosis not present

## 2019-08-22 DIAGNOSIS — M5136 Other intervertebral disc degeneration, lumbar region: Secondary | ICD-10-CM | POA: Diagnosis not present

## 2019-08-29 DIAGNOSIS — Z471 Aftercare following joint replacement surgery: Secondary | ICD-10-CM | POA: Diagnosis not present

## 2019-08-29 DIAGNOSIS — Z96643 Presence of artificial hip joint, bilateral: Secondary | ICD-10-CM | POA: Diagnosis not present

## 2019-08-29 DIAGNOSIS — M545 Low back pain: Secondary | ICD-10-CM | POA: Diagnosis not present

## 2019-08-30 DIAGNOSIS — R7989 Other specified abnormal findings of blood chemistry: Secondary | ICD-10-CM | POA: Diagnosis not present

## 2019-08-30 DIAGNOSIS — D539 Nutritional anemia, unspecified: Secondary | ICD-10-CM | POA: Diagnosis not present

## 2019-09-03 DIAGNOSIS — Z7901 Long term (current) use of anticoagulants: Secondary | ICD-10-CM | POA: Diagnosis not present

## 2019-09-03 DIAGNOSIS — Z79899 Other long term (current) drug therapy: Secondary | ICD-10-CM | POA: Diagnosis not present

## 2019-09-03 DIAGNOSIS — Z20828 Contact with and (suspected) exposure to other viral communicable diseases: Secondary | ICD-10-CM | POA: Diagnosis not present

## 2019-09-03 DIAGNOSIS — M25551 Pain in right hip: Secondary | ICD-10-CM | POA: Diagnosis not present

## 2019-09-03 DIAGNOSIS — R0602 Shortness of breath: Secondary | ICD-10-CM | POA: Diagnosis not present

## 2019-09-03 DIAGNOSIS — D539 Nutritional anemia, unspecified: Secondary | ICD-10-CM | POA: Diagnosis not present

## 2019-09-03 DIAGNOSIS — I4510 Unspecified right bundle-branch block: Secondary | ICD-10-CM | POA: Diagnosis not present

## 2019-09-03 DIAGNOSIS — G629 Polyneuropathy, unspecified: Secondary | ICD-10-CM | POA: Diagnosis not present

## 2019-09-03 DIAGNOSIS — Z87891 Personal history of nicotine dependence: Secondary | ICD-10-CM | POA: Diagnosis not present

## 2019-09-03 DIAGNOSIS — R072 Precordial pain: Secondary | ICD-10-CM | POA: Diagnosis not present

## 2019-09-03 DIAGNOSIS — R55 Syncope and collapse: Secondary | ICD-10-CM | POA: Diagnosis not present

## 2019-09-03 DIAGNOSIS — R110 Nausea: Secondary | ICD-10-CM | POA: Diagnosis not present

## 2019-09-03 DIAGNOSIS — I4891 Unspecified atrial fibrillation: Secondary | ICD-10-CM | POA: Diagnosis not present

## 2019-09-03 DIAGNOSIS — R42 Dizziness and giddiness: Secondary | ICD-10-CM | POA: Diagnosis not present

## 2019-09-03 DIAGNOSIS — R001 Bradycardia, unspecified: Secondary | ICD-10-CM | POA: Diagnosis not present

## 2019-09-03 DIAGNOSIS — R079 Chest pain, unspecified: Secondary | ICD-10-CM | POA: Diagnosis not present

## 2019-09-03 DIAGNOSIS — I10 Essential (primary) hypertension: Secondary | ICD-10-CM | POA: Diagnosis not present

## 2019-09-03 DIAGNOSIS — I959 Hypotension, unspecified: Secondary | ICD-10-CM | POA: Diagnosis not present

## 2019-09-04 DIAGNOSIS — R079 Chest pain, unspecified: Secondary | ICD-10-CM | POA: Diagnosis not present

## 2019-09-07 DIAGNOSIS — M5416 Radiculopathy, lumbar region: Secondary | ICD-10-CM | POA: Insufficient documentation

## 2019-09-07 DIAGNOSIS — M5136 Other intervertebral disc degeneration, lumbar region: Secondary | ICD-10-CM | POA: Insufficient documentation

## 2019-09-07 DIAGNOSIS — M545 Low back pain: Secondary | ICD-10-CM | POA: Diagnosis not present

## 2019-09-07 DIAGNOSIS — C61 Malignant neoplasm of prostate: Secondary | ICD-10-CM | POA: Insufficient documentation

## 2019-09-11 ENCOUNTER — Encounter: Payer: Self-pay | Admitting: Neurology

## 2019-09-11 DIAGNOSIS — Z7901 Long term (current) use of anticoagulants: Secondary | ICD-10-CM | POA: Diagnosis not present

## 2019-09-11 DIAGNOSIS — N200 Calculus of kidney: Secondary | ICD-10-CM | POA: Diagnosis not present

## 2019-09-11 DIAGNOSIS — G629 Polyneuropathy, unspecified: Secondary | ICD-10-CM | POA: Diagnosis not present

## 2019-09-11 DIAGNOSIS — N281 Cyst of kidney, acquired: Secondary | ICD-10-CM | POA: Diagnosis not present

## 2019-09-11 DIAGNOSIS — I080 Rheumatic disorders of both mitral and aortic valves: Secondary | ICD-10-CM | POA: Diagnosis not present

## 2019-09-11 DIAGNOSIS — H9193 Unspecified hearing loss, bilateral: Secondary | ICD-10-CM | POA: Diagnosis not present

## 2019-09-11 DIAGNOSIS — D539 Nutritional anemia, unspecified: Secondary | ICD-10-CM | POA: Diagnosis not present

## 2019-09-11 DIAGNOSIS — J479 Bronchiectasis, uncomplicated: Secondary | ICD-10-CM | POA: Diagnosis not present

## 2019-09-11 DIAGNOSIS — M419 Scoliosis, unspecified: Secondary | ICD-10-CM | POA: Diagnosis not present

## 2019-09-11 DIAGNOSIS — K5790 Diverticulosis of intestine, part unspecified, without perforation or abscess without bleeding: Secondary | ICD-10-CM | POA: Diagnosis not present

## 2019-09-11 DIAGNOSIS — M47812 Spondylosis without myelopathy or radiculopathy, cervical region: Secondary | ICD-10-CM | POA: Diagnosis not present

## 2019-09-11 DIAGNOSIS — R911 Solitary pulmonary nodule: Secondary | ICD-10-CM | POA: Diagnosis not present

## 2019-09-11 DIAGNOSIS — G2581 Restless legs syndrome: Secondary | ICD-10-CM | POA: Diagnosis not present

## 2019-09-11 DIAGNOSIS — D7389 Other diseases of spleen: Secondary | ICD-10-CM | POA: Diagnosis not present

## 2019-09-11 DIAGNOSIS — I480 Paroxysmal atrial fibrillation: Secondary | ICD-10-CM | POA: Diagnosis not present

## 2019-09-11 DIAGNOSIS — F419 Anxiety disorder, unspecified: Secondary | ICD-10-CM | POA: Diagnosis not present

## 2019-09-11 DIAGNOSIS — M48061 Spinal stenosis, lumbar region without neurogenic claudication: Secondary | ICD-10-CM | POA: Diagnosis not present

## 2019-09-11 DIAGNOSIS — J984 Other disorders of lung: Secondary | ICD-10-CM | POA: Diagnosis not present

## 2019-09-11 DIAGNOSIS — M47817 Spondylosis without myelopathy or radiculopathy, lumbosacral region: Secondary | ICD-10-CM | POA: Diagnosis not present

## 2019-09-11 DIAGNOSIS — M1990 Unspecified osteoarthritis, unspecified site: Secondary | ICD-10-CM | POA: Diagnosis not present

## 2019-09-11 DIAGNOSIS — M7631 Iliotibial band syndrome, right leg: Secondary | ICD-10-CM | POA: Diagnosis not present

## 2019-09-11 DIAGNOSIS — G5620 Lesion of ulnar nerve, unspecified upper limb: Secondary | ICD-10-CM | POA: Diagnosis not present

## 2019-09-11 DIAGNOSIS — D1809 Hemangioma of other sites: Secondary | ICD-10-CM | POA: Diagnosis not present

## 2019-09-11 DIAGNOSIS — Z8546 Personal history of malignant neoplasm of prostate: Secondary | ICD-10-CM | POA: Diagnosis not present

## 2019-09-11 DIAGNOSIS — Q619 Cystic kidney disease, unspecified: Secondary | ICD-10-CM | POA: Diagnosis not present

## 2019-09-12 ENCOUNTER — Other Ambulatory Visit: Payer: Self-pay

## 2019-09-12 DIAGNOSIS — R202 Paresthesia of skin: Secondary | ICD-10-CM

## 2019-09-25 ENCOUNTER — Ambulatory Visit (INDEPENDENT_AMBULATORY_CARE_PROVIDER_SITE_OTHER): Payer: PPO | Admitting: Neurology

## 2019-09-25 ENCOUNTER — Other Ambulatory Visit: Payer: Self-pay

## 2019-09-25 DIAGNOSIS — R202 Paresthesia of skin: Secondary | ICD-10-CM

## 2019-09-25 NOTE — Procedures (Signed)
[facility]  [place]  [place], [place] [address] Tel: [phone] Fax:  [phone] Test Date:  09/25/2019  Patient: Todd Salazar DOB: 05-Aug-1936 Physician: Narda Amber, DO  Sex: Male Height: 6\' 3"  Ref Phys: Melina Schools, MD  ID#: [id number] Temp: 31.0C Technician:    Patient Complaints: This is an 83 year old gentleman referred for evaluation of low back pain and right radicular leg pain.  NCV & EMG Findings: Extensive electrodiagnostic testing of the right lower extremity and additional studies of the left shows:  1. Bilateral sural and superficial peroneal sensory responses are within normal limits. 2. Bilateral peroneal motor responses are within normal limits.  Bilateral tibial motor responses show reduced amplitude.  In isolation, these findings are of unclear clinical significance. 3. Bilateral tibial H reflex studies are within normal limits when adjusted for patient's height. 4. There is no evidence of active or chronic motor axonal loss changes affecting any of the tested muscles.  Motor unit configuration and recruitment pattern is within normal limits.  Impression: This is a normal study of the right lower extremity.  In particular, there is no evidence of a lumbosacral radiculopathy or sensorimotor polyneuropathy.   ___________________________ Narda Amber, DO    Nerve Conduction Studies Anti Sensory Summary Table   Site NR Peak (ms) Norm Peak (ms) P-T Amp (V) Norm P-T Amp  Left Sup Peroneal Anti Sensory (Ant Lat Mall)  31C  12 cm    2.8 <4.6 4.7 >3  Right Sup Peroneal Anti Sensory (Ant Lat Mall)  31C  12 cm    3.1 <4.6 7.7 >3  Left Sural Anti Sensory (Lat Mall)  31C  Calf    3.2 <4.6 7.1 >3  Right Sural Anti Sensory (Lat Mall)  31C  Calf    3.1 <4.6 5.2 >3   Motor Summary Table   Site NR Onset (ms) Norm Onset (ms) O-P Amp (mV) Norm O-P Amp Site1 Site2 Delta-0 (ms) Dist (cm) Vel (m/s) Norm Vel (m/s)  Left Peroneal Motor  (Ext Dig Brev)  31C  Ankle    5.6 <6.0 2.5 >2.5 B Fib Ankle 10.3 41.0 40 >40  B Fib    15.9  2.1  Poplt B Fib 1.9 8.0 42 >40  Poplt    17.8  1.9         Right Peroneal Motor (Ext Dig Brev)  31C  Ankle    5.6 <6.0 2.5 >2.5 B Fib Ankle 10.5 43.0 41 >40  B Fib    16.1  2.4  Poplt B Fib 2.1 9.0 43 >40  Poplt    18.2  2.1         Left Peroneal TA Motor (Tib Ant)  31C  Fib Head    3.1 <4.5 3.6 >3 Poplit Fib Head 1.3 8.0 62 >40  Poplit    4.4  3.4         Right Peroneal TA Motor (Tib Ant)  31C  Fib Head    3.4 <4.5 3.3 >3 Poplit Fib Head 1.4 8.0 57 >40  Poplit    4.8  3.3         Left Tibial Motor (Abd Hall Brev)  31C  Ankle    4.8 <6.0 2.0 >4 Knee Ankle 10.9 44.0 40 >40  Knee    15.7  1.3         Right Tibial Motor (Abd Hall Brev)  31C  Ankle    4.4 <6.0 2.1 >4 Knee Ankle  11.0 45.0 41 >40  Knee    15.4  1.2          H Reflex Studies   NR H-Lat (ms) Lat Norm (ms) L-R H-Lat (ms)  Left Tibial (Gastroc)  31C     35.24 <35 0.00  Right Tibial (Gastroc)  31C     35.24 <35 0.00   EMG   Side Muscle Ins Act Fibs Psw Fasc Number Recrt Dur Dur. Amp Amp. Poly Poly. Comment  Right AntTibialis Nml Nml Nml Nml Nml Nml Nml Nml Nml Nml Nml Nml N/A  Right Gastroc Nml Nml Nml Nml Nml Nml Nml Nml Nml Nml Nml Nml N/A  Right RectFemoris Nml Nml Nml Nml Nml Nml Nml Nml Nml Nml Nml Nml N/A  Right GluteusMed Nml Nml Nml Nml Nml Nml Nml Nml Nml Nml Nml Nml N/A  Right BicepsFemS Nml Nml Nml Nml Nml Nml Nml Nml Nml Nml Nml Nml N/A      Waveforms:

## 2019-10-02 DIAGNOSIS — M79604 Pain in right leg: Secondary | ICD-10-CM | POA: Diagnosis not present

## 2019-10-02 DIAGNOSIS — M542 Cervicalgia: Secondary | ICD-10-CM | POA: Diagnosis not present

## 2019-10-02 DIAGNOSIS — M5136 Other intervertebral disc degeneration, lumbar region: Secondary | ICD-10-CM | POA: Diagnosis not present

## 2019-10-02 DIAGNOSIS — M48062 Spinal stenosis, lumbar region with neurogenic claudication: Secondary | ICD-10-CM | POA: Diagnosis not present

## 2019-10-04 DIAGNOSIS — M5136 Other intervertebral disc degeneration, lumbar region: Secondary | ICD-10-CM | POA: Diagnosis not present

## 2019-10-04 DIAGNOSIS — H6690 Otitis media, unspecified, unspecified ear: Secondary | ICD-10-CM | POA: Diagnosis not present

## 2019-10-04 DIAGNOSIS — C61 Malignant neoplasm of prostate: Secondary | ICD-10-CM | POA: Diagnosis not present

## 2019-10-04 DIAGNOSIS — R634 Abnormal weight loss: Secondary | ICD-10-CM | POA: Diagnosis not present

## 2019-10-04 DIAGNOSIS — Z23 Encounter for immunization: Secondary | ICD-10-CM | POA: Diagnosis not present

## 2019-10-08 DIAGNOSIS — I4891 Unspecified atrial fibrillation: Secondary | ICD-10-CM | POA: Diagnosis not present

## 2019-10-08 DIAGNOSIS — I471 Supraventricular tachycardia: Secondary | ICD-10-CM | POA: Diagnosis not present

## 2019-10-08 DIAGNOSIS — R079 Chest pain, unspecified: Secondary | ICD-10-CM | POA: Diagnosis not present

## 2019-10-15 DIAGNOSIS — M5416 Radiculopathy, lumbar region: Secondary | ICD-10-CM | POA: Diagnosis not present

## 2019-10-16 DIAGNOSIS — R202 Paresthesia of skin: Secondary | ICD-10-CM | POA: Diagnosis not present

## 2019-10-16 DIAGNOSIS — Z79899 Other long term (current) drug therapy: Secondary | ICD-10-CM | POA: Diagnosis not present

## 2019-10-18 DIAGNOSIS — I480 Paroxysmal atrial fibrillation: Secondary | ICD-10-CM | POA: Diagnosis not present

## 2019-10-22 ENCOUNTER — Encounter: Payer: PPO | Admitting: Neurology

## 2019-10-22 DIAGNOSIS — M5416 Radiculopathy, lumbar region: Secondary | ICD-10-CM | POA: Diagnosis not present

## 2019-10-23 ENCOUNTER — Telehealth: Payer: Self-pay

## 2019-10-23 ENCOUNTER — Other Ambulatory Visit: Payer: Self-pay | Admitting: Neurological Surgery

## 2019-10-23 DIAGNOSIS — M5416 Radiculopathy, lumbar region: Secondary | ICD-10-CM

## 2019-10-23 NOTE — Telephone Encounter (Signed)
Spoke with patient to review his medications and drug allergies before being scheduled for a myelogram.  He was informed he will be here about two hours, will need a driver and will need to be on strict bedrest for 24 hours after the procedure.  He understands we will call him to get him scheduled as soon as his cardiologist gives [place] the okay to hold his Eliquis for two days.

## 2019-11-05 ENCOUNTER — Ambulatory Visit
Admission: RE | Admit: 2019-11-05 | Discharge: 2019-11-05 | Disposition: A | Discharge status: Home / Self Care | Payer: PPO | Source: Ambulatory Visit | Attending: Neurological Surgery | Admitting: Neurological Surgery

## 2019-11-05 DIAGNOSIS — M5127 Other intervertebral disc displacement, lumbosacral region: Secondary | ICD-10-CM | POA: Diagnosis not present

## 2019-11-05 DIAGNOSIS — M5416 Radiculopathy, lumbar region: Secondary | ICD-10-CM

## 2019-11-05 DIAGNOSIS — M48061 Spinal stenosis, lumbar region without neurogenic claudication: Secondary | ICD-10-CM | POA: Diagnosis not present

## 2019-11-05 DIAGNOSIS — M5136 Other intervertebral disc degeneration, lumbar region: Secondary | ICD-10-CM | POA: Diagnosis not present

## 2019-11-05 DIAGNOSIS — M47814 Spondylosis without myelopathy or radiculopathy, thoracic region: Secondary | ICD-10-CM | POA: Diagnosis not present

## 2019-11-05 DIAGNOSIS — M4807 Spinal stenosis, lumbosacral region: Secondary | ICD-10-CM | POA: Diagnosis not present

## 2019-11-05 DIAGNOSIS — M5125 Other intervertebral disc displacement, thoracolumbar region: Secondary | ICD-10-CM | POA: Diagnosis not present

## 2019-11-05 MED ORDER — DIAZEPAM 5 MG PO TABS
5.0000 mg | ORAL_TABLET | Freq: Once | ORAL | Status: AC
  Administered 2019-11-05: 5 mg via ORAL

## 2019-11-05 MED ORDER — IOPAMIDOL (ISOVUE-M 200) INJECTION 41%
20.0000 mL | Freq: Once | INTRAMUSCULAR | Status: AC
  Administered 2019-11-05: 20 mL via INTRATHECAL

## 2019-11-05 NOTE — Discharge Instructions (Signed)

## 2019-11-05 NOTE — Progress Notes (Signed)
Patient states he has been off Eliquis for at least the past two days.

## 2019-11-12 DIAGNOSIS — M5416 Radiculopathy, lumbar region: Secondary | ICD-10-CM | POA: Diagnosis not present

## 2019-11-13 ENCOUNTER — Other Ambulatory Visit: Payer: Self-pay | Admitting: Neurological Surgery

## 2019-11-19 DIAGNOSIS — M5410 Radiculopathy, site unspecified: Secondary | ICD-10-CM | POA: Diagnosis not present

## 2019-11-20 NOTE — Progress Notes (Signed)
[place], [place] - [address] [place] [address] Phone: [phone] Fax: [phone]      Your procedure is scheduled on December 14  Report to [facility] Main Entrance "A" at 0530 A.M., and check in at the Admitting office.  Call this number if you have problems the morning of surgery:  [phone]  Call [phone] if you have any questions prior to your surgery date Monday-Friday 8am-4pm    Remember:  Do not eat or drink after midnight the night before your surgery     Take these medicines the morning of surgery with A SIP OF WATER  sotalol (BETAPACE)   Follow your surgeon's instructions on when to stop apixaban (ELIQUIS).  If no instructions were given by your surgeon then you will need to call the office to get those instructions.     7 days prior to surgery STOP taking any Aspirin (unless otherwise instructed by your surgeon), Aleve, Naproxen, Ibuprofen, Motrin, Advil, Goody's, BC's, all herbal medications, fish oil, and all vitamins.    The Morning of Surgery  Do not wear jewelry  Do not wear lotions, powders, or colognes, or deodorant  Men may shave face and neck.  Do not bring valuables to the hospital.  [facility] is not responsible for any belongings or valuables.  If you are a smoker, DO NOT Smoke 24 hours prior to surgery  If you wear a CPAP at night please bring your mask, tubing, and machine the morning of surgery   Remember that you must have someone to transport you home after your surgery, and remain with you for 24 hours if you are discharged the same day.   Please bring cases for contacts, glasses, hearing aids, dentures or bridgework because it cannot be worn into surgery.    Leave your suitcase in the car.  After surgery it may be brought to your room.  For patients admitted to the hospital, discharge time will be determined by your treatment team.  Patients discharged the day of surgery  will not be allowed to drive home.    Special instructions:   [place]- Preparing For Surgery  Before surgery, you can play an important role. Because skin is not sterile, your skin needs to be as free of germs as possible. You can reduce the number of germs on your skin by washing with CHG (chlorahexidine gluconate) Soap before surgery.  CHG is an antiseptic cleaner which kills germs and bonds with the skin to continue killing germs even after washing.    Oral Hygiene is also important to reduce your risk of infection.  Remember - BRUSH YOUR TEETH THE MORNING OF SURGERY WITH YOUR REGULAR TOOTHPASTE  Please do not use if you have an allergy to CHG or antibacterial soaps. If your skin becomes reddened/irritated stop using the CHG.  Do not shave (including legs and underarms) for at least 48 hours prior to first CHG shower. It is OK to shave your face.  Please follow these instructions carefully.   1. Shower the NIGHT BEFORE SURGERY and the MORNING OF SURGERY with CHG Soap.   2. If you chose to wash your hair, wash your hair first as usual with your normal shampoo.  3. After you shampoo, rinse your hair and body thoroughly to remove the shampoo.  4. Use CHG as you would any other liquid soap. You can apply CHG directly to the skin and wash gently with a  scrungie or a clean washcloth.   5. Apply the CHG Soap to your body ONLY FROM THE NECK DOWN.  Do not use on open wounds or open sores. Avoid contact with your eyes, ears, mouth and genitals (private parts). Wash Face and genitals (private parts)  with your normal soap.   6. Wash thoroughly, paying special attention to the area where your surgery will be performed.  7. Thoroughly rinse your body with warm water from the neck down.  8. DO NOT shower/wash with your normal soap after using and rinsing off the CHG Soap.  9. Pat yourself dry with a CLEAN TOWEL.  10. Wear CLEAN PAJAMAS to bed the night before surgery, wear comfortable  clothes the morning of surgery  11. Place CLEAN SHEETS on your bed the night of your first shower and DO NOT SLEEP WITH PETS.    Day of Surgery:  Please shower the morning of surgery with the CHG soap Do not apply any deodorants/lotions. Please wear clean clothes to the hospital/surgery center.   Remember to brush your teeth WITH YOUR REGULAR TOOTHPASTE.   Please read over the following fact sheets that you were given.

## 2019-11-21 ENCOUNTER — Encounter (HOSPITAL_COMMUNITY): Payer: Self-pay

## 2019-11-21 ENCOUNTER — Other Ambulatory Visit: Payer: Self-pay

## 2019-11-21 ENCOUNTER — Inpatient Hospital Stay (HOSPITAL_COMMUNITY): Admission: RE | Admit: 2019-11-21 | Payer: PPO | Source: Ambulatory Visit

## 2019-11-21 ENCOUNTER — Encounter (HOSPITAL_COMMUNITY)
Admission: RE | Admit: 2019-11-21 | Discharge: 2019-11-21 | Disposition: A | Discharge status: Home / Self Care | Payer: PPO | Source: Ambulatory Visit | Attending: Neurological Surgery | Admitting: Neurological Surgery

## 2019-11-21 ENCOUNTER — Encounter (HOSPITAL_COMMUNITY)
Admission: RE | Admit: 2019-11-21 | Discharge: 2019-11-21 | Disposition: A | Discharge status: Home / Self Care | Payer: PPO | Source: Ambulatory Visit | Attending: Internal Medicine | Admitting: Internal Medicine

## 2019-11-21 DIAGNOSIS — Z01810 Encounter for preprocedural cardiovascular examination: Secondary | ICD-10-CM | POA: Insufficient documentation

## 2019-11-21 DIAGNOSIS — Z01812 Encounter for preprocedural laboratory examination: Secondary | ICD-10-CM | POA: Diagnosis not present

## 2019-11-21 DIAGNOSIS — Z20822 Contact with and (suspected) exposure to covid-19: Secondary | ICD-10-CM

## 2019-11-21 DIAGNOSIS — R001 Bradycardia, unspecified: Secondary | ICD-10-CM | POA: Diagnosis not present

## 2019-11-21 DIAGNOSIS — M48061 Spinal stenosis, lumbar region without neurogenic claudication: Secondary | ICD-10-CM | POA: Insufficient documentation

## 2019-11-21 DIAGNOSIS — Z01818 Encounter for other preprocedural examination: Secondary | ICD-10-CM | POA: Diagnosis not present

## 2019-11-21 HISTORY — DX: Personal history of urinary calculi: Z87.442

## 2019-11-21 LAB — CBC
HCT: 32.8 % — ABNORMAL LOW (ref 39.0–52.0)
Hemoglobin: 10.8 g/dL — ABNORMAL LOW (ref 13.0–17.0)
MCH: 34.2 pg — ABNORMAL HIGH (ref 26.0–34.0)
MCHC: 32.9 g/dL (ref 30.0–36.0)
MCV: 103.8 fL — ABNORMAL HIGH (ref 80.0–100.0)
Platelets: 301 10*3/uL (ref 150–400)
RBC: 3.16 MIL/uL — ABNORMAL LOW (ref 4.22–5.81)
RDW: 18.6 % — ABNORMAL HIGH (ref 11.5–15.5)
WBC: 5 10*3/uL (ref 4.0–10.5)
nRBC: 0 % (ref 0.0–0.2)

## 2019-11-21 LAB — BASIC METABOLIC PANEL
Anion gap: 7 (ref 5–15)
BUN: 17 mg/dL (ref 8–23)
CO2: 25 mmol/L (ref 22–32)
Calcium: 9 mg/dL (ref 8.9–10.3)
Chloride: 110 mmol/L (ref 98–111)
Creatinine, Ser: 1.14 mg/dL (ref 0.61–1.24)
GFR calc Af Amer: >60 mL/min (ref >60)
GFR calc non Af Amer: 60 mL/min — ABNORMAL LOW (ref >60)
Glucose, Bld: 97 mg/dL (ref 70–99)
Potassium: 5 mmol/L (ref 3.5–5.1)
Sodium: 142 mmol/L (ref 135–145)

## 2019-11-21 LAB — SURGICAL PCR SCREEN
MRSA, PCR: NEGATIVE
Staphylococcus aureus: POSITIVE — AB

## 2019-11-21 NOTE — Progress Notes (Signed)
PCP:  Dr. Duwaine Maxin Cardiologist:  DR. Gwenith Spitz  EKG:  11/21/19 CXR:  11/21/19 ECHO:  2018 - Care Everywhere Stress Test:  09/04/19  Care Everywhere Cardiac Cath:  10/25/2018    Anesthesia Review:  Yes, AFIB.  Last dose Eliquis 11/20/19 PM  Covid test 11/21/19  Patient denies shortness of breath, fever, cough, and chest pain at PAT appointment.  Patient verbalized understanding of instructions provided today at the PAT appointment.  Patient asked to review instructions at home and day of surgery.

## 2019-11-22 NOTE — Progress Notes (Signed)
Anesthesia Chart Review:  Case: Todd Salazar Date/Time: 11/25/19 0715   Procedure: Laminectomy and Foraminotomy  L4-L5 - right, with instrumented fusion (Right Back)   Anesthesia type: General   Pre-op diagnosis: Stenosis   Location: [facility] OR ROOM 18 / [place] OR   Surgeons: Eustace Moore, MD      DISCUSSION: Patient is an 83 year old male scheduled for the above procedure.  History includes former smoker (quit 1997), afib (s/p pulmonary vein ablation 2015), prostate cancer (s/p prostatectomy 1996; replacement penile prosthesis 11/14/14).   Last evaluation by cardiologist Dr. Atilano Median on 10/18/19. He signed a note of cardiac clearance, classifying patient as "Low Risk" with permission to hold Eliquis for surgery. Patient reported last Eliquis 11/20/2019. For PT/INR on the day of surgery.   I did not see that he arrived to his 11/21/19 COVID-19 presurgical test; however, learned that he went to the Community testing line and not the presurgical testing line. I spoke with RN staff, given test was on 11/21/19, it is anticipated that his results should be in [place] by his surgery on 11/25/19. Lorriane Shire at Dr. Ronnald Ramp' office to update patient.      VS: BP 119/60   Pulse 86   Temp (!) 36.4 C (Oral)   Resp 16   Ht 6\' 3"  (1.905 m)   Wt 83.9 kg   SpO2 (!) 78%   BMI 23.12 kg/m  O2 sat entry noted, question if this is an erroneous entry. Anesthesia APP was not notified, and chart reviewed > 24 hours after PAT visit. Lorriane Shire spoke with Mr. Langenbach this afternoon (11/22/19), and I spoke with his PAT RN who indicated that patient without SOB, difficulty breathing, or other acute respiratory symptoms at his PAT RN visit. He denied fever, cough, chest pain, and SOB at PAT. He will get vitals on arrival for surgery.    PROVIDERS: [name], DO is PCP ([place]) Gwenith Spitz, MD is cardiologist ([place]) Leeanne Rio, MD is HEM ([place]). Last visit 08/30/19 for  follow-up macrocytic anemia.   LABS: Labs reviewed: Acceptable for surgery. (all labs ordered are listed, but only abnormal results are displayed)  Labs Reviewed  SURGICAL PCR SCREEN - Abnormal; Notable for the following components:      Result Value   Staphylococcus aureus POSITIVE (*)    All other components within normal limits     IMAGES: CXR 11/21/19: FINDINGS: The heart size and mediastinal contours are within normal limits. Bilateral chronic apical pleural thickening. Both lungs are otherwise clear. The visualized skeletal structures are unremarkable. IMPRESSION: No active cardiopulmonary disease.  CT L-spine/myelogram 11/05/19: IMPRESSION: 1. Advanced multilevel lumbar disc degeneration without significant change from the 07/27/2019 MRI. 2. Mild right and moderate left lateral recess stenosis and moderate bilateral neural foraminal stenosis at L4-5. 3. Mild lateral recess and moderate right neural foraminal stenosis at L3-4. 4. Mild right and moderate left neural foraminal stenosis at L5-S1. 5. Nonobstructing left nephrolithiasis. 6.  Aortic Atherosclerosis (ICD10-I70.0).    EKG: 11/21/19: Sinus bradycardia with sinus arrhythmia Septal infarct , age undetermined No significant change since last tracing Confirmed by Sanda Klein [phone]) on 11/21/2019 5:37:58 PM   CV: ZioPatch 10/08/19: According to 10/18/19 cardiology note by Dr. Atilano Median, ZioPatch showed: "showed 1% atrial fibrillation and some short runs of atrial tachycardia."   Nuclear stress test 09/04/19 [facility] CE) IMPRESSION: 1. No reversible ischemia or infarction. 2. Normal left ventricular wall motion. 3. Left ventricular ejection fraction 70% 4. Non  invasive risk stratification*: Low   Echo 07/26/19 [facility] CE): Summary Normal left ventricular size and systolic function with no appreciable segmental abnormality. Ejection fraction is visually estimated at 55-60% There is mild aortic  sclerosis noted, with no evidence of stenosis. Trace aortic regurgitation. Moderate tricuspid regurgitation. Moderate pulmonary hypertension. Estimated RVSP 50.9 mmHg. Estimated RAP 10 mmHg. No change compared to echo from 10/2018 except PA pressure appears higher.  [place] 11/14/18 [facility] CE): Conclusions Diagnostic Procedure Summary Right heart cath Low-normal RH/PA/PCWP (8) pressures reduced Cardiac index (2.0) no cardiac shunt by oximetry   Past Medical History:  Diagnosis Date  . Arthritis   . Atrial fibrillation ([place])   . Cancer [facility])    prostate  . Dysrhythmia    afib  . History of kidney stones     Past Surgical History:  Procedure Laterality Date  . Bone spur    . CHOLECYSTECTOMY    . HERNIA REPAIR    . JOINT REPLACEMENT    . kidney stones    . PROSTATE SURGERY      MEDICATIONS: . apixaban (ELIQUIS) 5 MG TABS tablet  . nitroGLYCERIN (NITROSTAT) 0.4 MG SL tablet  . sotalol (BETAPACE) 80 MG tablet   No current facility-administered medications for this encounter.    Myra Gianotti, PA-C Surgical Short Stay/Anesthesiology [facility] Phone [phone] [facility] Phone [phone] 11/22/2019 12:47 PM

## 2019-11-22 NOTE — Anesthesia Preprocedure Evaluation (Addendum)
Anesthesia Evaluation  Patient identified by MRN, date of birth, ID band Patient awake    Reviewed: Allergy & Precautions, NPO status , Patient's Chart, lab work & pertinent test results, reviewed documented beta blocker date and time   History of Anesthesia Complications Negative for: history of anesthetic complications  Airway Mallampati: I  TM Distance: >3 FB Neck ROM: Full    Dental   Pulmonary former smoker (quit 1997),  11/21/2019 SARS CoV2 NEG   breath sounds clear to auscultation       Cardiovascular hypertension, Pt. on medications and Pt. on home beta blockers (-) angina+ dysrhythmias Atrial Fibrillation  Rhythm:Irregular Rate:Normal  [phone] Stress: EF 70%, no ischemia or infarct '12 Stress: EF 67%, no ischemia 07/2019 ECHO: EF 55-60%, trace AI, mod TR   Neuro/Psych Chronic back pain    GI/Hepatic negative GI ROS, Neg liver ROS,   Endo/Other  negative endocrine ROS  Renal/GU negative Renal ROS   H/o prostate cancer    Musculoskeletal  (+) Arthritis ,   Abdominal   Peds  Hematology Eliquis: last dose Wednesday   Anesthesia Other Findings   Reproductive/Obstetrics                            Anesthesia Physical Anesthesia Plan  ASA: III  Anesthesia Plan: General   Post-op Pain Management:    Induction: Intravenous  PONV Risk Score and Plan: 3 and Ondansetron, Dexamethasone and Treatment may vary due to age or medical condition  Airway Management Planned: Oral ETT  Additional Equipment: None  Intra-op Plan:   Post-operative Plan: Extubation in OR  Informed Consent: I have reviewed the patients History and Physical, chart, labs and discussed the procedure including the risks, benefits and alternatives for the proposed anesthesia with the patient or authorized representative who has indicated his/her understanding and acceptance.       Plan Discussed with: CRNA and  Surgeon  Anesthesia Plan Comments: (PAT note written 11/22/2019 by Myra Gianotti, PA-C. )      Anesthesia Quick Evaluation

## 2019-11-23 LAB — NOVEL CORONAVIRUS, NAA: SARS-CoV-2, NAA: NOT DETECTED

## 2019-11-25 ENCOUNTER — Inpatient Hospital Stay (HOSPITAL_COMMUNITY): Payer: PPO | Admitting: Anesthesiology

## 2019-11-25 ENCOUNTER — Other Ambulatory Visit: Payer: Self-pay

## 2019-11-25 ENCOUNTER — Inpatient Hospital Stay (HOSPITAL_COMMUNITY): Payer: PPO

## 2019-11-25 ENCOUNTER — Encounter (HOSPITAL_COMMUNITY): Payer: Self-pay | Admitting: Neurological Surgery

## 2019-11-25 ENCOUNTER — Inpatient Hospital Stay (HOSPITAL_COMMUNITY): Payer: PPO | Admitting: Vascular Surgery

## 2019-11-25 ENCOUNTER — Encounter (HOSPITAL_COMMUNITY)
Admission: RE | Disposition: A | Discharge status: Home / Self Care | Payer: Self-pay | Source: Home / Self Care | Attending: Neurological Surgery

## 2019-11-25 ENCOUNTER — Inpatient Hospital Stay (HOSPITAL_COMMUNITY)
Admission: RE | Admit: 2019-11-25 | Discharge: 2019-11-26 | DRG: 460 | Disposition: A | Discharge status: Home / Self Care | Payer: PPO | Attending: Neurological Surgery | Admitting: Neurological Surgery

## 2019-11-25 DIAGNOSIS — Z7901 Long term (current) use of anticoagulants: Secondary | ICD-10-CM | POA: Diagnosis not present

## 2019-11-25 DIAGNOSIS — Z981 Arthrodesis status: Secondary | ICD-10-CM

## 2019-11-25 DIAGNOSIS — Z8546 Personal history of malignant neoplasm of prostate: Secondary | ICD-10-CM

## 2019-11-25 DIAGNOSIS — Z882 Allergy status to sulfonamides status: Secondary | ICD-10-CM

## 2019-11-25 DIAGNOSIS — M5416 Radiculopathy, lumbar region: Secondary | ICD-10-CM | POA: Diagnosis not present

## 2019-11-25 DIAGNOSIS — M1990 Unspecified osteoarthritis, unspecified site: Secondary | ICD-10-CM | POA: Diagnosis present

## 2019-11-25 DIAGNOSIS — Z20828 Contact with and (suspected) exposure to other viral communicable diseases: Secondary | ICD-10-CM | POA: Diagnosis not present

## 2019-11-25 DIAGNOSIS — M4326 Fusion of spine, lumbar region: Secondary | ICD-10-CM | POA: Diagnosis not present

## 2019-11-25 DIAGNOSIS — M79604 Pain in right leg: Secondary | ICD-10-CM | POA: Diagnosis present

## 2019-11-25 DIAGNOSIS — M48061 Spinal stenosis, lumbar region without neurogenic claudication: Principal | ICD-10-CM | POA: Diagnosis present

## 2019-11-25 DIAGNOSIS — Z419 Encounter for procedure for purposes other than remedying health state, unspecified: Secondary | ICD-10-CM

## 2019-11-25 DIAGNOSIS — M5116 Intervertebral disc disorders with radiculopathy, lumbar region: Secondary | ICD-10-CM | POA: Diagnosis not present

## 2019-11-25 DIAGNOSIS — I4891 Unspecified atrial fibrillation: Secondary | ICD-10-CM | POA: Diagnosis present

## 2019-11-25 DIAGNOSIS — Z87891 Personal history of nicotine dependence: Secondary | ICD-10-CM | POA: Diagnosis not present

## 2019-11-25 DIAGNOSIS — I10 Essential (primary) hypertension: Secondary | ICD-10-CM | POA: Diagnosis not present

## 2019-11-25 DIAGNOSIS — Z881 Allergy status to other antibiotic agents status: Secondary | ICD-10-CM | POA: Diagnosis not present

## 2019-11-25 DIAGNOSIS — M4727 Other spondylosis with radiculopathy, lumbosacral region: Secondary | ICD-10-CM | POA: Diagnosis not present

## 2019-11-25 HISTORY — PX: LAMINECTOMY WITH POSTERIOR LATERAL ARTHRODESIS LEVEL 1: SHX6335

## 2019-11-25 LAB — PROTIME-INR
INR: 1.2 (ref 0.8–1.2)
Prothrombin Time: 14.8 seconds (ref 11.4–15.2)

## 2019-11-25 SURGERY — LAMINECTOMY WITH POSTERIOR LATERAL ARTHRODESIS LEVEL 1
Anesthesia: General | Site: Back | Laterality: Right

## 2019-11-25 MED ORDER — HEPARIN SODIUM (PORCINE) 1000 UNIT/ML IJ SOLN
INTRAMUSCULAR | Status: DC | PRN
  Administered 2019-11-25: 5000 [IU]

## 2019-11-25 MED ORDER — BUPIVACAINE HCL (PF) 0.25 % IJ SOLN
INTRAMUSCULAR | Status: AC
  Filled 2019-11-25: qty 30

## 2019-11-25 MED ORDER — 0.9 % SODIUM CHLORIDE (POUR BTL) OPTIME
TOPICAL | Status: DC | PRN
  Administered 2019-11-25: 1000 mL

## 2019-11-25 MED ORDER — ONDANSETRON HCL 4 MG PO TABS: 4.0000 mg | ORAL_TABLET | Freq: Four times a day (QID) | ORAL | Status: DC | PRN

## 2019-11-25 MED ORDER — ONDANSETRON HCL 4 MG/2ML IJ SOLN
INTRAMUSCULAR | Status: DC | PRN
  Administered 2019-11-25: 4 mg via INTRAVENOUS

## 2019-11-25 MED ORDER — FENTANYL CITRATE (PF) 250 MCG/5ML IJ SOLN
INTRAMUSCULAR | Status: DC | PRN
  Administered 2019-11-25 (×2): 50 ug via INTRAVENOUS
  Administered 2019-11-25: 25 ug via INTRAVENOUS

## 2019-11-25 MED ORDER — VANCOMYCIN HCL 1000 MG IV SOLR
INTRAVENOUS | Status: AC
  Filled 2019-11-25: qty 1000

## 2019-11-25 MED ORDER — CHLORHEXIDINE GLUCONATE CLOTH 2 % EX PADS: 6.0000 | MEDICATED_PAD | Freq: Once | CUTANEOUS | Status: DC

## 2019-11-25 MED ORDER — SOTALOL HCL 80 MG PO TABS
80.0000 mg | ORAL_TABLET | Freq: Every day | ORAL | Status: DC
  Filled 2019-11-25: qty 1

## 2019-11-25 MED ORDER — PHENYLEPHRINE 40 MCG/ML (10ML) SYRINGE FOR IV PUSH (FOR BLOOD PRESSURE SUPPORT)
PREFILLED_SYRINGE | INTRAVENOUS | Status: DC | PRN
  Administered 2019-11-25: 80 ug via INTRAVENOUS
  Administered 2019-11-25: 40 ug via INTRAVENOUS

## 2019-11-25 MED ORDER — DEXAMETHASONE SODIUM PHOSPHATE 10 MG/ML IJ SOLN
INTRAMUSCULAR | Status: DC | PRN
  Administered 2019-11-25: 5 mg via INTRAVENOUS

## 2019-11-25 MED ORDER — POTASSIUM CHLORIDE IN NACL 20-0.9 MEQ/L-% IV SOLN: INTRAVENOUS | Status: DC

## 2019-11-25 MED ORDER — EPHEDRINE 5 MG/ML INJ
INTRAVENOUS | Status: AC
  Filled 2019-11-25: qty 10

## 2019-11-25 MED ORDER — LIDOCAINE 2% (20 MG/ML) 5 ML SYRINGE
INTRAMUSCULAR | Status: DC | PRN
  Administered 2019-11-25: 40 mg via INTRAVENOUS

## 2019-11-25 MED ORDER — ONDANSETRON HCL 4 MG/2ML IJ SOLN
4.0000 mg | Freq: Four times a day (QID) | INTRAMUSCULAR | Status: DC | PRN
  Administered 2019-11-25: 4 mg via INTRAVENOUS
  Filled 2019-11-25: qty 2

## 2019-11-25 MED ORDER — THROMBIN 20000 UNITS EX SOLR
CUTANEOUS | Status: AC
  Filled 2019-11-25: qty 20000

## 2019-11-25 MED ORDER — THROMBIN 5000 UNITS EX SOLR
OROMUCOSAL | Status: DC | PRN
  Administered 2019-11-25: 09:00:00 5 mL

## 2019-11-25 MED ORDER — CEFAZOLIN SODIUM-DEXTROSE 2-4 GM/100ML-% IV SOLN
2.0000 g | Freq: Three times a day (TID) | INTRAVENOUS | Status: AC
  Administered 2019-11-25 (×2): 2 g via INTRAVENOUS
  Filled 2019-11-25 (×2): qty 100

## 2019-11-25 MED ORDER — SODIUM CHLORIDE 0.9 % IV SOLN: 250.0000 mL | INTRAVENOUS | Status: DC

## 2019-11-25 MED ORDER — MORPHINE SULFATE (PF) 2 MG/ML IV SOLN
2.0000 mg | INTRAVENOUS | Status: DC | PRN
  Administered 2019-11-25: 2 mg via INTRAVENOUS
  Filled 2019-11-25: qty 1

## 2019-11-25 MED ORDER — SODIUM CHLORIDE 0.9 % IV SOLN
INTRAVENOUS | Status: DC | PRN
  Administered 2019-11-25: 09:00:00 500 mL

## 2019-11-25 MED ORDER — FENTANYL CITRATE (PF) 100 MCG/2ML IJ SOLN
25.0000 ug | INTRAMUSCULAR | Status: DC | PRN
  Administered 2019-11-25: 50 ug via INTRAVENOUS

## 2019-11-25 MED ORDER — OXYCODONE HCL 5 MG PO TABS
5.0000 mg | ORAL_TABLET | ORAL | Status: DC | PRN
  Administered 2019-11-25 (×2): 5 mg via ORAL
  Filled 2019-11-25 (×2): qty 1

## 2019-11-25 MED ORDER — THROMBIN 20000 UNITS EX SOLR
CUTANEOUS | Status: DC | PRN
  Administered 2019-11-25: 09:00:00 20 mL

## 2019-11-25 MED ORDER — MENTHOL 3 MG MT LOZG: 1.0000 | LOZENGE | OROMUCOSAL | Status: DC | PRN

## 2019-11-25 MED ORDER — LIDOCAINE 2% (20 MG/ML) 5 ML SYRINGE
INTRAMUSCULAR | Status: AC
  Filled 2019-11-25: qty 5

## 2019-11-25 MED ORDER — TIZANIDINE HCL 4 MG PO TABS
2.0000 mg | ORAL_TABLET | Freq: Three times a day (TID) | ORAL | Status: DC | PRN
  Administered 2019-11-25: 2 mg via ORAL
  Filled 2019-11-25: qty 1

## 2019-11-25 MED ORDER — PROPOFOL 10 MG/ML IV BOLUS
INTRAVENOUS | Status: DC | PRN
  Administered 2019-11-25: 10 mg via INTRAVENOUS
  Administered 2019-11-25: 100 mg via INTRAVENOUS

## 2019-11-25 MED ORDER — DEXAMETHASONE SODIUM PHOSPHATE 10 MG/ML IJ SOLN
10.0000 mg | Freq: Once | INTRAMUSCULAR | Status: AC
  Administered 2019-11-25: 10 mg via INTRAVENOUS
  Filled 2019-11-25: qty 1

## 2019-11-25 MED ORDER — PROPOFOL 10 MG/ML IV BOLUS
INTRAVENOUS | Status: AC
  Filled 2019-11-25: qty 20

## 2019-11-25 MED ORDER — BUPIVACAINE HCL (PF) 0.25 % IJ SOLN
INTRAMUSCULAR | Status: DC | PRN
  Administered 2019-11-25: 10 mL

## 2019-11-25 MED ORDER — SUGAMMADEX SODIUM 200 MG/2ML IV SOLN
INTRAVENOUS | Status: DC | PRN
  Administered 2019-11-25: 200 mg via INTRAVENOUS

## 2019-11-25 MED ORDER — PHENOL 1.4 % MT LIQD: 1.0000 | OROMUCOSAL | Status: DC | PRN

## 2019-11-25 MED ORDER — SENNA 8.6 MG PO TABS
1.0000 | ORAL_TABLET | Freq: Two times a day (BID) | ORAL | Status: DC
  Administered 2019-11-25 (×2): 8.6 mg via ORAL
  Filled 2019-11-25 (×2): qty 1

## 2019-11-25 MED ORDER — LACTATED RINGERS IV SOLN
INTRAVENOUS | Status: DC | PRN
  Administered 2019-11-25 (×2): via INTRAVENOUS

## 2019-11-25 MED ORDER — CEFAZOLIN SODIUM-DEXTROSE 2-4 GM/100ML-% IV SOLN
2.0000 g | INTRAVENOUS | Status: AC
  Administered 2019-11-25: 2 g via INTRAVENOUS
  Filled 2019-11-25: qty 100

## 2019-11-25 MED ORDER — HYDROXYZINE HCL 50 MG/ML IM SOLN
50.0000 mg | Freq: Four times a day (QID) | INTRAMUSCULAR | Status: DC | PRN
  Administered 2019-11-25: 50 mg via INTRAMUSCULAR
  Filled 2019-11-25: qty 1

## 2019-11-25 MED ORDER — PHENYLEPHRINE HCL-NACL 10-0.9 MG/250ML-% IV SOLN
INTRAVENOUS | Status: DC | PRN
  Administered 2019-11-25: 25 ug/min via INTRAVENOUS

## 2019-11-25 MED ORDER — PHENYLEPHRINE 40 MCG/ML (10ML) SYRINGE FOR IV PUSH (FOR BLOOD PRESSURE SUPPORT)
PREFILLED_SYRINGE | INTRAVENOUS | Status: AC
  Filled 2019-11-25: qty 10

## 2019-11-25 MED ORDER — ROCURONIUM BROMIDE 10 MG/ML (PF) SYRINGE
PREFILLED_SYRINGE | INTRAVENOUS | Status: AC
  Filled 2019-11-25: qty 10

## 2019-11-25 MED ORDER — HEPARIN SODIUM (PORCINE) 1000 UNIT/ML IJ SOLN
INTRAMUSCULAR | Status: AC
  Filled 2019-11-25: qty 1

## 2019-11-25 MED ORDER — SODIUM CHLORIDE 0.9% FLUSH: 3.0000 mL | INTRAVENOUS | Status: DC | PRN

## 2019-11-25 MED ORDER — ACETAMINOPHEN 650 MG RE SUPP: 650.0000 mg | RECTAL | Status: DC | PRN

## 2019-11-25 MED ORDER — FENTANYL CITRATE (PF) 250 MCG/5ML IJ SOLN
INTRAMUSCULAR | Status: AC
  Filled 2019-11-25: qty 5

## 2019-11-25 MED ORDER — ARTHREX ANGEL - ACD-A SOLUTION (CHARTING ONLY) OPTIME
TOPICAL | Status: DC | PRN
  Administered 2019-11-25: 09:00:00 10 mL via TOPICAL

## 2019-11-25 MED ORDER — ONDANSETRON HCL 4 MG/2ML IJ SOLN
INTRAMUSCULAR | Status: AC
  Filled 2019-11-25: qty 2

## 2019-11-25 MED ORDER — SODIUM CHLORIDE 0.9% FLUSH
3.0000 mL | Freq: Two times a day (BID) | INTRAVENOUS | Status: DC
  Administered 2019-11-25: 3 mL via INTRAVENOUS

## 2019-11-25 MED ORDER — DEXAMETHASONE SODIUM PHOSPHATE 10 MG/ML IJ SOLN
INTRAMUSCULAR | Status: AC
  Filled 2019-11-25: qty 1

## 2019-11-25 MED ORDER — ROCURONIUM BROMIDE 10 MG/ML (PF) SYRINGE
PREFILLED_SYRINGE | INTRAVENOUS | Status: DC | PRN
  Administered 2019-11-25: 10 mg via INTRAVENOUS
  Administered 2019-11-25: 60 mg via INTRAVENOUS

## 2019-11-25 MED ORDER — THROMBIN 5000 UNITS EX SOLR
CUTANEOUS | Status: AC
  Filled 2019-11-25: qty 5000

## 2019-11-25 MED ORDER — EPHEDRINE SULFATE-NACL 50-0.9 MG/10ML-% IV SOSY
PREFILLED_SYRINGE | INTRAVENOUS | Status: DC | PRN
  Administered 2019-11-25: 15 mg via INTRAVENOUS

## 2019-11-25 MED ORDER — ACETAMINOPHEN 325 MG PO TABS: 650.0000 mg | ORAL_TABLET | ORAL | Status: DC | PRN

## 2019-11-25 MED ORDER — MIDAZOLAM HCL 2 MG/2ML IJ SOLN
INTRAMUSCULAR | Status: AC
  Filled 2019-11-25: qty 2

## 2019-11-25 MED ORDER — FENTANYL CITRATE (PF) 100 MCG/2ML IJ SOLN
INTRAMUSCULAR | Status: AC
  Filled 2019-11-25: qty 2

## 2019-11-25 SURGICAL SUPPLY — 63 items
BAG DECANTER FOR FLEXI CONT (MISCELLANEOUS) ×3 IMPLANT
BASKET BONE COLLECTION (BASKET) IMPLANT
BENZOIN TINCTURE PRP APPL 2/3 (GAUZE/BANDAGES/DRESSINGS) ×3 IMPLANT
BLADE CLIPPER SURG (BLADE) IMPLANT
BONE CANC CHIPS 20CC PCAN1/4 (Bone Implant) ×3 IMPLANT
BUR MATCHSTICK NEURO 3.0 LAGG (BURR) ×3 IMPLANT
CANISTER SUCT 3000ML PPV (MISCELLANEOUS) ×3 IMPLANT
CARTRIDGE OIL MAESTRO DRILL (MISCELLANEOUS) ×1 IMPLANT
CHIPS CANC BONE 20CC PCAN1/4 (Bone Implant) ×1 IMPLANT
CLOSURE WOUND 1/2 X4 (GAUZE/BANDAGES/DRESSINGS) ×1
CONT SPEC 4OZ CLIKSEAL STRL BL (MISCELLANEOUS) ×3 IMPLANT
COVER BACK TABLE 60X90IN (DRAPES) ×3 IMPLANT
COVER WAND RF STERILE (DRAPES) ×3 IMPLANT
DERMABOND ADVANCED (GAUZE/BANDAGES/DRESSINGS) ×2
DERMABOND ADVANCED .7 DNX12 (GAUZE/BANDAGES/DRESSINGS) ×1 IMPLANT
DIFFUSER DRILL AIR PNEUMATIC (MISCELLANEOUS) ×3 IMPLANT
DRAPE C-ARM 42X72 X-RAY (DRAPES) IMPLANT
DRAPE LAPAROTOMY 100X72X124 (DRAPES) ×3 IMPLANT
DRAPE POUCH INSTRU U-SHP 10X18 (DRAPES) ×3 IMPLANT
DRAPE SURG 17X23 STRL (DRAPES) ×3 IMPLANT
DRSG OPSITE POSTOP 4X6 (GAUZE/BANDAGES/DRESSINGS) ×3 IMPLANT
DURAPREP 26ML APPLICATOR (WOUND CARE) ×3 IMPLANT
ELECT REM PT RETURN 9FT ADLT (ELECTROSURGICAL) ×3
ELECTRODE REM PT RTRN 9FT ADLT (ELECTROSURGICAL) ×1 IMPLANT
EVACUATOR 1/8 PVC DRAIN (DRAIN) IMPLANT
GAUZE 4X4 16PLY RFD (DISPOSABLE) IMPLANT
GLOVE BIO SURGEON STRL SZ7 (GLOVE) IMPLANT
GLOVE BIO SURGEON STRL SZ8 (GLOVE) ×6 IMPLANT
GLOVE BIOGEL PI IND STRL 7.0 (GLOVE) IMPLANT
GLOVE BIOGEL PI INDICATOR 7.0 (GLOVE)
GOWN STRL REUS W/ TWL LRG LVL3 (GOWN DISPOSABLE) IMPLANT
GOWN STRL REUS W/ TWL XL LVL3 (GOWN DISPOSABLE) ×2 IMPLANT
GOWN STRL REUS W/TWL 2XL LVL3 (GOWN DISPOSABLE) IMPLANT
GOWN STRL REUS W/TWL LRG LVL3 (GOWN DISPOSABLE)
GOWN STRL REUS W/TWL XL LVL3 (GOWN DISPOSABLE) ×4
HEMOSTAT POWDER KIT SURGIFOAM (HEMOSTASIS) ×3 IMPLANT
KIT BASIN OR (CUSTOM PROCEDURE TRAY) ×3 IMPLANT
KIT BONE MRW ASP ANGEL CPRP (KITS) ×3 IMPLANT
KIT TURNOVER KIT B (KITS) ×3 IMPLANT
NEEDLE HYPO 25X1 1.5 SAFETY (NEEDLE) ×3 IMPLANT
NS IRRIG 1000ML POUR BTL (IV SOLUTION) ×3 IMPLANT
OIL CARTRIDGE MAESTRO DRILL (MISCELLANEOUS) ×3
PACK LAMINECTOMY NEURO (CUSTOM PROCEDURE TRAY) ×3 IMPLANT
PAD ARMBOARD 7.5X6 YLW CONV (MISCELLANEOUS) ×9 IMPLANT
PUTTY DBM ALLOSYNC PURE 10CC (Putty) ×3 IMPLANT
ROD LORD LIPPED TI 5.5X40 (Rod) ×6 IMPLANT
SCREW CANC SHANK MOD 6.5X45 (Screw) ×6 IMPLANT
SCREW CORT SHANK MOD 6.5X40 (Screw) ×6 IMPLANT
SCREW POLYAXIAL TULIP (Screw) ×12 IMPLANT
SET SCREW (Screw) ×8 IMPLANT
SET SCREW SPNE (Screw) ×4 IMPLANT
SPONGE LAP 4X18 RFD (DISPOSABLE) IMPLANT
SPONGE SURGIFOAM ABS GEL 100 (HEMOSTASIS) ×3 IMPLANT
SPONGE SURGIFOAM ABS GEL 100C (HEMOSTASIS) ×3 IMPLANT
STRIP CLOSURE SKIN 1/2X4 (GAUZE/BANDAGES/DRESSINGS) ×2 IMPLANT
SUT VIC AB 0 CT1 18XCR BRD8 (SUTURE) ×1 IMPLANT
SUT VIC AB 0 CT1 8-18 (SUTURE) ×2
SUT VIC AB 2-0 CP2 18 (SUTURE) ×3 IMPLANT
SUT VIC AB 3-0 SH 8-18 (SUTURE) ×6 IMPLANT
TOWEL GREEN STERILE (TOWEL DISPOSABLE) ×3 IMPLANT
TOWEL GREEN STERILE FF (TOWEL DISPOSABLE) ×3 IMPLANT
TRAY FOLEY MTR SLVR 16FR STAT (SET/KITS/TRAYS/PACK) IMPLANT
WATER STERILE IRR 1000ML POUR (IV SOLUTION) ×3 IMPLANT

## 2019-11-25 NOTE — Op Note (Signed)
11/25/2019  9:59 AM  PATIENT:  Todd Salazar  83 y.o. 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 Decompressive lumbar laminectomy, medial facetectomy and foraminotomy L4-5 right  2. Posterior fixation L4-5 using Alphatec cortical pedicle screws.  3. Intertransverse arthrodesis L4-5 bilaterally using morcellized autograft and allograft soaked with a bone marrow aspirate obtained through a separate fascial incision over the right iliac crest.  SURGEON:  Sherley Bounds, MD  ASSISTANTS: Glenford Peers, FNP  ANESTHESIA:  General  EBL: 75 ml  Total I/O In: 1000 [I.V.:1000] Out: 235 [Urine:160; Blood:75]  BLOOD ADMINISTERED:none  DRAINS: none   INDICATION FOR PROCEDURE: This patient presented with severe right leg pain. Imaging revealed foraminal stenosis and lateral recess stenosis L4-5 on the right. The patient tried a reasonable attempt at conservative medical measures without relief. I recommended decompression and instrumented fusion to address the stenosis.  Patient understood the risks, benefits, and alternatives and potential outcomes and wished to proceed.  PROCEDURE DETAILS:  The patient was brought to the operating room. After induction of generalized endotracheal anesthesia the patient was rolled into the prone position on chest rolls and all pressure points were padded. The patient's lumbar region was cleaned and then prepped with DuraPrep and draped in the usual sterile fashion. Anesthesia was injected and then a dorsal midline incision was made and carried down to the lumbosacral fascia. The fascia was opened and the paraspinous musculature was taken down in a subperiosteal fashion to expose L4-5. A self-retaining retractor was placed. Intraoperative fluoroscopy confirmed my level, and I started with placement of the L4 and L5 cortical  pedicle screws. The pedicle screw entry zones were identified utilizing surface landmarks and  AP and lateral fluoroscopy. I scored the cortex with the high-speed drill and then used the hand drill to drill an upward and outward direction into the pedicle. I then tapped line to line. I then placed a 6.5 x 45 mm cortical pedicle screw into the pedicles of L4 and L5 bilaterally.  I then dissected in a suprafascial plane to expose the iliac crest.  Opened the fascia and we used a Jamshidi needle to extract 60 cc of bone marrow aspirate from the iliac crest with the assistance of my nurse practitioner.  This was then spun down by [facility] device and 2 to 4 cc of  BMAC was soaked on morselized allograft for later arthrodesis.  I dried the hole with Surgifoam and closed the fascia.  I then turned my attention to the decompression and complete lumbar laminectomy, hemi- facetectomy, and foraminotomy was performed at L4-5 on the right.  My nurse practitioner was directly involved in the decompression and exposure of the neural elements.  Much more generous decompression and generous foraminotomy was undertaken in order to adequately decompress the neural elements and address the patient's leg pain. The yellow ligament was removed to expose the underlying dura and nerve roots, and generous foraminotomy was performed to adequately decompress the neural elements at L4-5 on the right. Both the exiting and traversing nerve roots were decompressed until a coronary dilator passed easily along the nerve roots.   We then decorticated the transverse processes and laid a mixture of morcellized autograft and allograft out over these to perform intertransverse arthrodesis at L4-5 bilaterally. We then placed lordotic rods into the multiaxial screw heads of the pedicle screws and locked these in  position with the locking caps and anti-torque device. We then checked our construct with AP and lateral fluoroscopy. Irrigated with copious amounts  of bacitracin-containing saline solution. Inspected the nerve roots once again to assure adequate decompression, lined to the dura with Gelfoam, and then we closed the muscle and the fascia with 0 Vicryl. Closed the subcutaneous tissues with 2-0 Vicryl and subcuticular tissues with 3-0 Vicryl. The skin was closed with benzoin and Steri-Strips. Dressing was then applied, the patient was awakened from general anesthesia and transported to the recovery room in stable condition. At the end of the procedure all sponge, needle and instrument counts were correct.   PLAN OF CARE: admit to inpatient  PATIENT DISPOSITION:  PACU - hemodynamically stable.   Delay start of Pharmacological VTE agent (>24hrs) due to surgical blood loss or risk of bleeding:  yes

## 2019-11-25 NOTE — Transfer of Care (Signed)
Immediate Anesthesia Transfer of Care Note  Patient: Todd Salazar  Procedure(s) Performed: Laminectomy and Foraminotomy  Lumbar Four-Lumbar Five - right, with instrumented fusion (Right Back)  Patient Location: PACU  Anesthesia Type:General  Level of Consciousness: awake, patient cooperative and responds to stimulation  Airway & Oxygen Therapy: Patient Spontanous Breathing and Patient connected to nasal cannula oxygen  Post-op Assessment: Report given to RN, Post -op Vital signs reviewed and stable and Patient moving all extremities X 4  Post vital signs: Reviewed and stable  Last Vitals:  Vitals Value Taken Time  BP 109/56 11/25/19 0957  Temp 36.5 C 11/25/19 1000  Pulse 60 11/25/19 1010  Resp 17 11/25/19 1010  SpO2 100 % 11/25/19 1010  Vitals shown include unvalidated device data.  Last Pain:  Vitals:   11/25/19 1000  PainSc: 0-No pain      Patients Stated Pain Goal: 3 ([id number])  Complications: No apparent anesthesia complications

## 2019-11-25 NOTE — Anesthesia Postprocedure Evaluation (Signed)
Anesthesia Post Note  Patient: Todd Salazar  Procedure(s) Performed: Laminectomy and Foraminotomy  Lumbar Four-Lumbar Five - right, with instrumented fusion (Right Back)     Patient location during evaluation: PACU Anesthesia Type: General Level of consciousness: awake and alert, patient cooperative and oriented Pain management: pain level controlled (pain improving with medication) Vital Signs Assessment: post-procedure vital signs reviewed and stable Respiratory status: spontaneous breathing, nonlabored ventilation and respiratory function stable Cardiovascular status: blood pressure returned to baseline and stable Postop Assessment: no apparent nausea or vomiting Anesthetic complications: no    Last Vitals:  Vitals:   11/25/19 1040 11/25/19 1104  BP: (!) 96/57 (!) 101/53  Pulse: (!) 58 (!) 55  Resp: 14 18  Temp: 36.6 C 36.5 C  SpO2: 100% 97%    Last Pain:  Vitals:   11/25/19 1157  TempSrc:   PainSc: 8                  [name],E. Vick Filter

## 2019-11-25 NOTE — H&P (Signed)
Subjective: Patient is a 83 y.o. male admitted for R leg pain. Onset of symptoms was several months ago, gradually worsening since that time.  The pain is rated intense, unremitting, and is located at the across the lower back and radiates to RLE. The pain is described as aching and occurs all day. The symptoms have been progressive. Symptoms are exacerbated by exercise. MRI or CT showed stenosis with instability L4-5   Past Medical History:  Diagnosis Date  . Arthritis   . Atrial fibrillation ([place])   . Cancer [facility])    prostate  . Dysrhythmia    afib  . History of kidney stones     Past Surgical History:  Procedure Laterality Date  . Bone spur    . CHOLECYSTECTOMY    . HERNIA REPAIR    . JOINT REPLACEMENT    . kidney stones    . PROSTATE SURGERY      Prior to Admission medications   Medication Sig Start Date End Date Taking? Authorizing Provider  apixaban (ELIQUIS) 5 MG TABS tablet Take 5 mg by mouth 2 (two) times daily.   Yes [provider]  sotalol (BETAPACE) 80 MG tablet Take 80 mg by mouth daily.    Yes [provider]  nitroGLYCERIN (NITROSTAT) 0.4 MG SL tablet Place 0.4 mg under the tongue every 5 (five) minutes as needed for chest pain.  07/25/19   [provider]   Allergies  Allergen Reactions  . Duloxetine Other (See Comments)    delusions  . Gabapentin Other (See Comments)    Affects eyesight  . Hydrocodone-Acetaminophen Nausea And Vomiting  . Prednisone Nausea And Vomiting and Other (See Comments)    confusion  . Tramadol Other (See Comments)    Reaction unknown  . Ciprofloxacin Other (See Comments)    Dizziness   . Methocarbamol Nausea Only  . Ondansetron Hcl Nausea Only  . Sulfa Antibiotics Rash    Social History   Tobacco Use  . Smoking status: Former Smoker    Quit date: 12/10/1996    Years since quitting: 22.9  . Smokeless tobacco: Never Used  Substance Use Topics  . Alcohol use: No    History reviewed. No pertinent  family history.   Review of Systems  Positive ROS: neg  All other systems have been reviewed and were otherwise negative with the exception of those mentioned in the HPI and as above.  Objective: Vital signs in last 24 hours: Temp:  [97.8 F (36.6 C)] 97.8 F (36.6 C) (12/14 0552) Resp:  [18] 18 (12/14 0552) BP: (128)/(68) 128/68 (12/14 0552) SpO2:  [100 %] 100 % (12/14 0552) Weight:  [81.6 kg] 81.6 kg (12/14 0552)  General Appearance: Alert, cooperative, no distress, appears stated age Head: Normocephalic, without obvious abnormality, atraumatic Eyes: PERRL, conjunctiva/corneas clear, EOM's intact    Neck: Supple, symmetrical, trachea midline Back: Symmetric, no curvature, ROM normal, no CVA tenderness Lungs:  respirations unlabored Heart: Regular rate and rhythm Abdomen: Soft, non-tender Extremities: Extremities normal, atraumatic, no cyanosis or edema Pulses: 2+ and symmetric all extremities Skin: Skin color, texture, turgor normal, no rashes or lesions  NEUROLOGIC:   Mental status: Alert and oriented x4,  no aphasia, good attention span, fund of knowledge, and memory Motor Exam - grossly normal Sensory Exam - grossly normal Reflexes: 1+ Coordination - grossly normal Gait - grossly normal Balance - grossly normal Cranial Nerves: I: smell Not tested  II: visual acuity  OS: nl    OD: nl  II: visual fields Full to confrontation  II: pupils Equal, round, reactive to light  III,VII: ptosis None  III,IV,VI: extraocular muscles  Full ROM  V: mastication Normal  V: facial light touch sensation  Normal  V,VII: corneal reflex  Present  VII: facial muscle function - upper  Normal  VII: facial muscle function - lower Normal  VIII: hearing Not tested  IX: soft palate elevation  Normal  IX,X: gag reflex Present  XI: trapezius strength  5/5  XI: sternocleidomastoid strength 5/5  XI: neck flexion strength  5/5  XII: tongue strength  Normal    Data Review Lab Results   Component Value Date   WBC 5.0 11/21/2019   HGB 10.8 (L) 11/21/2019   HCT 32.8 (L) 11/21/2019   MCV 103.8 (H) 11/21/2019   PLT 301 11/21/2019   Lab Results  Component Value Date   NA 142 11/21/2019   K 5.0 11/21/2019   CL 110 11/21/2019   CO2 25 11/21/2019   BUN 17 11/21/2019   CREATININE 1.14 11/21/2019   GLUCOSE 97 11/21/2019   Lab Results  Component Value Date   INR 1.2 11/25/2019    Assessment/Plan:  Estimated body mass index is 22.5 kg/m as calculated from the following:   Height as of this encounter: 6\' 3"  (1.905 m).   Weight as of this encounter: 81.6 kg. Patient admitted for DLL L4-5 R with fusion. Patient has failed a reasonable attempt at conservative therapy.  I explained the condition and procedure to the patient and answered any questions.  Patient wishes to proceed with procedure as planned. Understands risks/ benefits and typical outcomes of procedure.   Eustace Moore 11/25/2019 7:25 AM

## 2019-11-26 MED ORDER — TIZANIDINE HCL 2 MG PO TABS: 2.0000 mg | ORAL_TABLET | Freq: Three times a day (TID) | ORAL | 0 refills | Status: DC | PRN

## 2019-11-26 MED ORDER — OXYCODONE HCL 5 MG PO TABS: 5.0000 mg | ORAL_TABLET | ORAL | 0 refills | Status: DC | PRN

## 2019-11-26 NOTE — Evaluation (Signed)
Occupational Therapy Evaluation Patient Details Name: Todd Salazar MRN: [id number] DOB: June 10, 1936 Today's Date: 11/26/2019    History of Present Illness Pt is 83 yo male with PMH A-fib, R THA, and prostate cancer who had progressive RLE hip pain radiating down leg to the point he could not ambulate. Pt underwent PLIF L4-5.    Clinical Impression   PTA patient modified independent with ADLs (using AE for LB dressing) and using rollator for mobility. Admitted for above and limited by problem list below, including impaired balance, pain, back precautions.  He currently requires supervision for UB ADLS, min assist for LB ADls and supervision for transfers.  He is able to verbalize use of AE for LB ADLS at home, plans to have supervision for bathing in shower (and educated on long sponge).  He has good support from son and neighbor as needed. Based on performance today, no further OT needs have been identified and OT will sign off. Thank you for this referral.     Follow Up Recommendations  No OT follow up;Supervision - Intermittent    Equipment Recommendations  None recommended by OT    Recommendations for Other Services       Precautions / Restrictions Precautions Precautions: Back Precaution Booklet Issued: Yes (comment) Precaution Comments: reviewed precautions  Required Braces or Orthoses: Spinal Brace Spinal Brace: Lumbar corset;Applied in sitting position Restrictions Weight Bearing Restrictions: No      Mobility Bed Mobility Overal bed mobility: Needs Assistance Bed Mobility: Rolling;Sidelying to Sit Rolling: Supervision Sidelying to sit: Supervision     Sit to sidelying: Supervision General bed mobility comments: able to complete with verbal cueing for log roll technique  Transfers Overall transfer level: Needs assistance Equipment used: Rolling walker (2 wheeled) Transfers: Sit to/from Stand Sit to Stand: Supervision         General transfer comment:  supervision for safety     Balance Overall balance assessment: Mild deficits observed, not formally tested                                         ADL either performed or assessed with clinical judgement   ADL Overall ADL's : Needs assistance/impaired     Grooming: Supervision/safety;Standing;Cueing for compensatory techniques   Upper Body Bathing: Set up;Sitting   Lower Body Bathing: Minimal assistance;Adhering to back precautions;Sitting/lateral leans;Sit to/from stand Lower Body Bathing Details (indicate cue type and reason): educated on use of long sponge, reports will have support when bathing  Upper Body Dressing : Set up;Sitting Upper Body Dressing Details (indicate cue type and reason): able to don brace without assist  Lower Body Dressing: Minimal assistance;Sit to/from stand Lower Body Dressing Details (indicate cue type and reason): assist for shoes, but plans to use AE at home; supervision sit to stand  Toilet Transfer: Supervision/safety;Ambulation;RW Toilet Transfer Details (indicate cue type and reason): simulated in room         Functional mobility during ADLs: Supervision/safety;Rolling walker General ADL Comments: pt educated on back precautions, ADl compensatory techniques, safety, recommednations      Vision   Vision Assessment?: No apparent visual deficits     Perception     Praxis      Pertinent Vitals/Pain Pain Assessment: Faces Faces Pain Scale: Hurts a little bit Pain Location: back Pain Descriptors / Indicators: Operative site guarding Pain Intervention(s): Monitored during session;Repositioned     Hand Dominance Right  Extremity/Trunk Assessment Upper Extremity Assessment Upper Extremity Assessment: Overall WFL for tasks assessed   Lower Extremity Assessment Lower Extremity Assessment: Defer to PT evaluation RLE Deficits / Details: hip flexor shortening with resultant lack of R hip extension, general weakness noted  of R hip though no Trendelenberg RLE Sensation: WNL RLE Coordination: WNL   Cervical / Trunk Assessment Cervical / Trunk Assessment: Kyphotic;Other exceptions Cervical / Trunk Exceptions: s/p lumbar surgery   Communication Communication Communication: No difficulties   Cognition Arousal/Alertness: Awake/alert Behavior During Therapy: WFL for tasks assessed/performed Overall Cognitive Status: Within Functional Limits for tasks assessed                                     General Comments  discussed posture and flat supine position as well as standing against wall for hip flexor stretching    Exercises     Shoulder Instructions      Home Living Family/patient expects to be discharged to:: Private residence Living Arrangements: Alone Available Help at Discharge: Family;Available PRN/intermittently Type of Home: House Home Access: Stairs to enter [employer] of Steps: 1 Entrance Stairs-Rails: None Home Layout: One level     Bathroom Shower/Tub: Occupational psychologist: Handicapped height     Home Equipment: Environmental consultant - 2 wheels;Walker - 4 wheels;Shower seat - built in;Grab bars - toilet;Grab bars - tub/shower;Adaptive equipment Adaptive Equipment: Sock aid;Reacher;Long-handled shoe horn Additional Comments: pt's sons live nearby and can help if he needs them, also has a very attentive neighbor      Prior Functioning/Environment Level of Independence: Independent with assistive device(s)        Comments: was using RW but function had become very limited by RLE pain; uses sock aide and reacher for LB dressing         OT Problem List: Decreased activity tolerance;Decreased safety awareness;Decreased knowledge of precautions;Decreased knowledge of use of DME or AE;Pain      OT Treatment/Interventions:      OT Goals(Current goals can be found in the care plan section) Acute Rehab OT Goals Patient Stated Goal: return home OT Goal  Formulation: With patient  OT Frequency:     Barriers to D/C:            Co-evaluation              AM-PAC OT "6 Clicks" Daily Activity     Outcome Measure Help from another person eating meals?: None Help from another person taking care of personal grooming?: A Little Help from another person toileting, which includes using toliet, bedpan, or urinal?: A Little Help from another person bathing (including washing, rinsing, drying)?: A Little Help from another person to put on and taking off regular upper body clothing?: A Little Help from another person to put on and taking off regular lower body clothing?: A Little 6 Click Score: 19   End of Session Equipment Utilized During Treatment: Rolling walker;Back brace Nurse Communication: Mobility status  Activity Tolerance: Patient tolerated treatment well Patient left: with call bell/phone within reach;Other (comment)(seated EOB )  OT Visit Diagnosis: Other abnormalities of gait and mobility (R26.89);Pain Pain - part of body: (back- incisonal)                Time: [id number] OT Time Calculation (min): 16 min Charges:  OT General Charges $OT Visit: 1 Visit OT Evaluation $OT Eval Low Complexity: 1 Low  Adonis Brook  B, [place] Pager [phone] Office [phone]   Delight Stare 11/26/2019, 10:35 AM

## 2019-11-26 NOTE — Discharge Summary (Signed)
Physician Discharge Summary  Patient ID: Todd Salazar MRN: [id number] DOB/AGE: 08-10-1936 83 y.o.  Admit date: 11/25/2019 Discharge date: 11/26/2019  Admission Diagnoses: lumbar radiculopathy    Discharge Diagnoses: same   Discharged Condition: good  Hospital Course: The patient was admitted on 11/25/2019 and taken to the operating room where the patient underwent l4-5 decompression and fusion. The patient tolerated the procedure well and was taken to the recovery room and then to the floor in stable condition. The hospital course was routine. There were no complications. The wound remained clean dry and intact. Pt had appropriate back soreness. No complaints of leg pain or new N/T/W. The patient remained afebrile with stable vital signs, and tolerated a regular diet. The patient continued to increase activities, and pain was well controlled with oral pain medications.   Consults: None  Significant Diagnostic Studies:  Results for orders placed or performed during the hospital encounter of 11/25/19  Protime-INR  Result Value Ref Range   Prothrombin Time 14.8 11.4 - 15.2 seconds   INR 1.2 0.8 - 1.2    Chest 2 View  Result Date: 11/21/2019 CLINICAL DATA:  Pre operative respiratory exam. EXAM: CHEST - 2 VIEW COMPARISON:  09/03/2019 and 02/26/2017 FINDINGS: The heart size and mediastinal contours are within normal limits. Bilateral chronic apical pleural thickening. Both lungs are otherwise clear. The visualized skeletal structures are unremarkable. IMPRESSION: No active cardiopulmonary disease. Electronically Signed   By: Lorriane Shire M.D.   On: 11/21/2019 15:25   DG Lumbar Spine 2-3 Views  Result Date: 11/25/2019 CLINICAL DATA:  Instrumented fusion.  L4-L5 PLIF. EXAM: LUMBAR SPINE - 2-3 VIEW; DG C-ARM 1-60 MIN COMPARISON:  CT lumbar spine 11/05/2019 FINDINGS: Two intraoperative fluoroscopic images of the lumbar spine are submitted. Bilateral transpedicle screws and vertical  interconnecting rods at L4-L5. IMPRESSION: Intraoperative fluoroscopic images demonstrating bilateral L4-L5 transpedicle screws and vertical interconnecting rods. Electronically Signed   By: Kellie Simmering DO   On: 11/25/2019 09:50   CT LUMBAR SPINE W CONTRAST  Result Date: 11/05/2019 CLINICAL DATA:  Right L5 radiculopathy. EXAM: LUMBAR MYELOGRAM FLUOROSCOPY TIME:  Fluoroscopy Time: 27 seconds Radiation Exposure Index: 207.33 microGray*m^2 PROCEDURE: After thorough discussion of risks and benefits of the procedure including bleeding, infection, injury to nerves, blood vessels, adjacent structures as well as headache and CSF leak, written and oral informed consent was obtained. Consent was obtained by Dr. Logan Bores. Time out form was completed. Patient was positioned prone on the fluoroscopy table. Local anesthesia was provided with 1% lidocaine without epinephrine after prepped and draped in the usual sterile fashion. Puncture was performed at L5-S1 using a 3 1/2 inch 22-gauge spinal needle via a left interlaminar approach. Using a single pass through the dura, the needle was placed within the thecal sac, with return of clear CSF. 15 mL of Isovue M-200 was injected into the thecal sac, with normal opacification of the nerve roots and cauda equina consistent with free flow within the subarachnoid space. I personally performed the lumbar puncture and administered the intrathecal contrast. I also personally supervised acquisition of the myelogram images. TECHNIQUE: Contiguous axial images were obtained through the Lumbar spine after the intrathecal infusion of infusion. Coronal and sagittal reconstructions were obtained of the axial image sets. COMPARISON:  Lumbar spine MRI 07/27/2019 FINDINGS: LUMBAR MYELOGRAM FINDINGS: There are 5 non rib-bearing lumbar type vertebrae with hypoplastic ribs at T12. Trace retrolisthesis of L3 on L4 and L4 on L5 reduce with flexion. Small ventral extradural defects are present  throughout the lumbar spine without high-grade spinal stenosis. There is evidence of lateral recess narrowing on the right at L3-4 and on the left at L4-5. CT LUMBAR MYELOGRAM FINDINGS: There is mild lower lumbar levoscoliosis, and there is trace retrolisthesis of L3 on L4 and L4 on L5. No fracture or suspicious osseous lesion is identified. An hemangioma is again noted in the L4 vertebral body. Schmorl's nodes are noted in the T12, L1, and L2 superior endplates. Vacuum disc phenomenon is present throughout the lumbar and included lower thoracic spine. Disc space narrowing is severe from L2-3 to L4-5 with interbody ankylosis at L2-3, and there is mild-to-moderate disc space narrowing at the other levels. The conus medullaris terminates at L1. Previous cholecystectomy, nonobstructing left renal calculi, and aortic atherosclerosis are noted. A 1.1 cm low-density lesion in the posterior upper pole of the right kidney likely represents a cyst. T11-12: Moderate right facet arthrosis without disc herniation or stenosis. T12-L1: Mild disc bulging, small right paracentral disc protrusion, and mild facet arthrosis without stenosis, unchanged. L1-2: Minimal disc bulging without stenosis, unchanged. L2-3: Diffuse endplate spurring and facet hypertrophy result in mild right lateral recess stenosis and borderline to mild bilateral neural foraminal stenosis without spinal stenosis, unchanged. L3-4: Circumferential disc osteophyte complex and facet and ligamentum flavum hypertrophy result in mild right greater left lateral recess stenosis and moderate right and mild left neural foraminal stenosis without significant spinal stenosis, unchanged. L4-5: Circumferential disc osteophyte complex and facet hypertrophy result in mild right and moderate left lateral recess stenosis, mild spinal stenosis, and moderate bilateral neural foraminal stenosis, unchanged. L5-S1: Disc bulging, a small left paracentral to subarticular disc  protrusion, and severe facet arthrosis result in mild left lateral recess stenosis and mild right and moderate left neural foraminal stenosis without spinal stenosis, unchanged. There is a small amount of nitrogen gas in the ventral epidural space superior to the left paracentral disc protrusion. IMPRESSION: 1. Advanced multilevel lumbar disc degeneration without significant change from the 07/27/2019 MRI. 2. Mild right and moderate left lateral recess stenosis and moderate bilateral neural foraminal stenosis at L4-5. 3. Mild lateral recess and moderate right neural foraminal stenosis at L3-4. 4. Mild right and moderate left neural foraminal stenosis at L5-S1. 5. Nonobstructing left nephrolithiasis. 6.  Aortic Atherosclerosis (ICD10-I70.0). Electronically Signed   By: Logan Bores M.D.   On: 11/05/2019 15:40   DG C-Arm 1-60 Min  Result Date: 11/25/2019 CLINICAL DATA:  Instrumented fusion.  L4-L5 PLIF. EXAM: LUMBAR SPINE - 2-3 VIEW; DG C-ARM 1-60 MIN COMPARISON:  CT lumbar spine 11/05/2019 FINDINGS: Two intraoperative fluoroscopic images of the lumbar spine are submitted. Bilateral transpedicle screws and vertical interconnecting rods at L4-L5. IMPRESSION: Intraoperative fluoroscopic images demonstrating bilateral L4-L5 transpedicle screws and vertical interconnecting rods. Electronically Signed   By: Kellie Simmering DO   On: 11/25/2019 09:50   DG MYELOGRAPHY LUMBAR INJ LUMBOSACRAL  Result Date: 11/05/2019 CLINICAL DATA:  Right L5 radiculopathy. EXAM: LUMBAR MYELOGRAM FLUOROSCOPY TIME:  Fluoroscopy Time: 27 seconds Radiation Exposure Index: 207.33 microGray*m^2 PROCEDURE: After thorough discussion of risks and benefits of the procedure including bleeding, infection, injury to nerves, blood vessels, adjacent structures as well as headache and CSF leak, written and oral informed consent was obtained. Consent was obtained by Dr. Logan Bores. Time out form was completed. Patient was positioned prone on the  fluoroscopy table. Local anesthesia was provided with 1% lidocaine without epinephrine after prepped and draped in the usual sterile fashion. Puncture was performed at L5-S1 using a  3 1/2 inch 22-gauge spinal needle via a left interlaminar approach. Using a single pass through the dura, the needle was placed within the thecal sac, with return of clear CSF. 15 mL of Isovue M-200 was injected into the thecal sac, with normal opacification of the nerve roots and cauda equina consistent with free flow within the subarachnoid space. I personally performed the lumbar puncture and administered the intrathecal contrast. I also personally supervised acquisition of the myelogram images. TECHNIQUE: Contiguous axial images were obtained through the Lumbar spine after the intrathecal infusion of infusion. Coronal and sagittal reconstructions were obtained of the axial image sets. COMPARISON:  Lumbar spine MRI 07/27/2019 FINDINGS: LUMBAR MYELOGRAM FINDINGS: There are 5 non rib-bearing lumbar type vertebrae with hypoplastic ribs at T12. Trace retrolisthesis of L3 on L4 and L4 on L5 reduce with flexion. Small ventral extradural defects are present throughout the lumbar spine without high-grade spinal stenosis. There is evidence of lateral recess narrowing on the right at L3-4 and on the left at L4-5. CT LUMBAR MYELOGRAM FINDINGS: There is mild lower lumbar levoscoliosis, and there is trace retrolisthesis of L3 on L4 and L4 on L5. No fracture or suspicious osseous lesion is identified. An hemangioma is again noted in the L4 vertebral body. Schmorl's nodes are noted in the T12, L1, and L2 superior endplates. Vacuum disc phenomenon is present throughout the lumbar and included lower thoracic spine. Disc space narrowing is severe from L2-3 to L4-5 with interbody ankylosis at L2-3, and there is mild-to-moderate disc space narrowing at the other levels. The conus medullaris terminates at L1. Previous cholecystectomy, nonobstructing left  renal calculi, and aortic atherosclerosis are noted. A 1.1 cm low-density lesion in the posterior upper pole of the right kidney likely represents a cyst. T11-12: Moderate right facet arthrosis without disc herniation or stenosis. T12-L1: Mild disc bulging, small right paracentral disc protrusion, and mild facet arthrosis without stenosis, unchanged. L1-2: Minimal disc bulging without stenosis, unchanged. L2-3: Diffuse endplate spurring and facet hypertrophy result in mild right lateral recess stenosis and borderline to mild bilateral neural foraminal stenosis without spinal stenosis, unchanged. L3-4: Circumferential disc osteophyte complex and facet and ligamentum flavum hypertrophy result in mild right greater left lateral recess stenosis and moderate right and mild left neural foraminal stenosis without significant spinal stenosis, unchanged. L4-5: Circumferential disc osteophyte complex and facet hypertrophy result in mild right and moderate left lateral recess stenosis, mild spinal stenosis, and moderate bilateral neural foraminal stenosis, unchanged. L5-S1: Disc bulging, a small left paracentral to subarticular disc protrusion, and severe facet arthrosis result in mild left lateral recess stenosis and mild right and moderate left neural foraminal stenosis without spinal stenosis, unchanged. There is a small amount of nitrogen gas in the ventral epidural space superior to the left paracentral disc protrusion. IMPRESSION: 1. Advanced multilevel lumbar disc degeneration without significant change from the 07/27/2019 MRI. 2. Mild right and moderate left lateral recess stenosis and moderate bilateral neural foraminal stenosis at L4-5. 3. Mild lateral recess and moderate right neural foraminal stenosis at L3-4. 4. Mild right and moderate left neural foraminal stenosis at L5-S1. 5. Nonobstructing left nephrolithiasis. 6.  Aortic Atherosclerosis (ICD10-I70.0). Electronically Signed   By: Logan Bores M.D.   On:  11/05/2019 15:40    Antibiotics:  Anti-infectives (From admission, onward)   Start     Dose/Rate Route Frequency Ordered Stop   11/25/19 1600  ceFAZolin (ANCEF) IVPB 2g/100 mL premix     2 g 200 mL/hr over 30 Minutes Intravenous  Every 8 hours 11/25/19 1103 11/25/19 2345   11/25/19 0831  bacitracin 50,000 Units in sodium chloride 0.9 % 500 mL irrigation  Status:  Discontinued       As needed 11/25/19 0832 11/25/19 0951   11/25/19 0630  ceFAZolin (ANCEF) IVPB 2g/100 mL premix     2 g 200 mL/hr over 30 Minutes Intravenous On call to O.R. 11/25/19 [id number] 11/25/19 0755      Discharge Exam: Blood pressure (!) 98/57, pulse 62, temperature 97.9 F (36.6 C), temperature source Oral, resp. rate 18, height 6\' 3"  (1.905 m), weight 81.6 kg, SpO2 98 %. Neurologic: Grossly normal Dressing dry  Discharge Medications:   Allergies as of 11/26/2019      Reactions   Duloxetine Other (See Comments)   delusions   Gabapentin Other (See Comments)   Affects eyesight   Hydrocodone-acetaminophen Nausea And Vomiting   Prednisone Nausea And Vomiting, Other (See Comments)   confusion   Tramadol Other (See Comments)   Reaction unknown   Ciprofloxacin Other (See Comments)   Dizziness   Methocarbamol Nausea Only   Ondansetron Hcl Nausea Only   Sulfa Antibiotics Rash      Medication List    TAKE these medications   apixaban 5 MG Tabs tablet Commonly known as: ELIQUIS Take 5 mg by mouth 2 (two) times daily.   nitroGLYCERIN 0.4 MG SL tablet Commonly known as: NITROSTAT Place 0.4 mg under the tongue every 5 (five) minutes as needed for chest pain.   oxyCODONE 5 MG immediate release tablet Commonly known as: Oxy IR/ROXICODONE Take 1 tablet (5 mg total) by mouth every 4 (four) hours as needed for moderate pain ((score 4 to 6)).   sotalol 80 MG tablet Commonly known as: BETAPACE Take 80 mg by mouth daily.   tiZANidine 2 MG tablet Commonly known as: ZANAFLEX Take 1 tablet (2 mg total) by mouth  every 8 (eight) hours as needed for muscle spasms.            Durable Medical Equipment  (From admission, onward)         Start     Ordered   11/25/19 1104  DME Walker rolling  Once    Question:  Patient needs a walker to treat with the following condition  Answer:  S/P lumbar fusion   11/25/19 1103   11/25/19 1104  DME 3 n 1  Once     11/25/19 1103          Disposition: home   Final Dx: L4-5 decompression/ fusion  Discharge Instructions     Remove dressing in 72 hours   Complete by: As directed    Call MD for:  difficulty breathing, headache or visual disturbances   Complete by: As directed    Call MD for:  persistant nausea and vomiting   Complete by: As directed    Call MD for:  redness, tenderness, or signs of infection (pain, swelling, redness, odor or green/yellow discharge around incision site)   Complete by: As directed    Call MD for:  severe uncontrolled pain   Complete by: As directed    Call MD for:  temperature >100.4   Complete by: As directed    Diet - low sodium heart healthy   Complete by: As directed    Increase activity slowly   Complete by: As directed          Signed: Eustace Moore 11/26/2019, 7:56 AM

## 2019-11-26 NOTE — Plan of Care (Signed)
Pt given D/C instructions with verbal understanding. Rx's were sent to pharmacy by MD. Pt's incision is clean and dry with no sign of infection. Pt's IV was removed prior to D/C. Pt D/C'd home via wheelchair per MD order. Pt is stable @ D/C and has no other needs at this time. Alixis Harmon, RN  

## 2019-11-26 NOTE — Evaluation (Signed)
Physical Therapy Evaluation Patient Details Name: Todd Salazar MRN: [id number] DOB: 1936-04-16 Today's Date: 11/26/2019   History of Present Illness  Pt is 83 yo male with PMH A-fib, R THA, and prostate cancer who had progressive RLE hip pain radiating down leg to the point he could not ambulate. Pt underwent PLIF L4-5.   Clinical Impression  Patient evaluated by Physical Therapy with no further acute PT needs identified. All education has been completed and the patient has no further questions. Pt mobilizing well without significant pain, was very pleased to be able to ambulate 400' with RW and step up a stair with RLE leading. He could do neither of these prior to surgery. Worked on hip flexor stretching and he will continue this at home.  See below for any follow-up Physical Therapy or equipment needs. PT is signing off. Thank you for this referral.     Follow Up Recommendations No PT follow up    Equipment Recommendations  None recommended by PT    Recommendations for Other Services       Precautions / Restrictions Precautions Precautions: Back Precaution Booklet Issued: Yes (comment) Precaution Comments: optional for pt Required Braces or Orthoses: Spinal Brace Spinal Brace: Lumbar corset;Applied in sitting position Restrictions Weight Bearing Restrictions: No      Mobility  Bed Mobility Overal bed mobility: Needs Assistance Bed Mobility: Rolling;Sidelying to Sit;Sit to Sidelying Rolling: Supervision Sidelying to sit: Supervision     Sit to sidelying: Supervision General bed mobility comments: pt able to perform tasks independently, supervision for vc's for back precautions  Transfers Overall transfer level: Needs assistance Equipment used: Rolling walker (2 wheeled) Transfers: Sit to/from Stand Sit to Stand: Supervision         General transfer comment: supervision for safety  Ambulation/Gait Ambulation/Gait assistance: Supervision Gait Distance (Feet):  400 Feet Assistive device: Rolling walker (2 wheeled) Gait Pattern/deviations: Trunk flexed;Step-through pattern Gait velocity: decreased Gait velocity interpretation: 1.31 - 2.62 ft/sec, indicative of limited community ambulator General Gait Details: pt can achieve nearly erect posture in static standing but immediately flexes at hips with mobility, worked on this but it will take some time  Stairs Stairs: Yes Stairs assistance: Min guard Stair Management: One rail Right;Step to pattern;Forwards Number of Stairs: 3 General stair comments: pt encouraged to be able to step up with RLE  Wheelchair Mobility    Modified Rankin (Stroke Patients Only)       Balance Overall balance assessment: Mild deficits observed, not formally tested                                           Pertinent Vitals/Pain Pain Assessment: Faces Faces Pain Scale: Hurts a little bit Pain Location: back Pain Descriptors / Indicators: Operative site guarding Pain Intervention(s): Limited activity within patient's tolerance;Monitored during session    Home Living Family/patient expects to be discharged to:: Private residence Living Arrangements: Alone Available Help at Discharge: Family;Available PRN/intermittently Type of Home: House Home Access: Stairs to enter Entrance Stairs-Rails: None Entrance Stairs-Number of Steps: 1 Home Layout: One level Home Equipment: Walker - 2 wheels;Walker - 4 wheels Additional Comments: pt's sons live nearby and can help if he needs them, also has a very attentive neighbor    Prior Function Level of Independence: Independent with assistive device(s)         Comments: was using RW but function had become  very limited by RLE pain     Hand Dominance   Dominant Hand: Right    Extremity/Trunk Assessment   Upper Extremity Assessment Upper Extremity Assessment: Defer to OT evaluation    Lower Extremity Assessment Lower Extremity Assessment:  RLE deficits/detail RLE Deficits / Details: hip flexor shortening with resultant lack of R hip extension, general weakness noted of R hip though no Trendelenberg RLE Sensation: WNL RLE Coordination: WNL    Cervical / Trunk Assessment Cervical / Trunk Assessment: Kyphotic  Communication   Communication: No difficulties  Cognition Arousal/Alertness: Awake/alert Behavior During Therapy: WFL for tasks assessed/performed Overall Cognitive Status: Within Functional Limits for tasks assessed                                        General Comments General comments (skin integrity, edema, etc.): discussed posture and flat supine position as well as standing against wall for hip flexor stretching    Exercises     Assessment/Plan    PT Assessment Patent does not need any further PT services  PT Problem List         PT Treatment Interventions      PT Goals (Current goals can be found in the Care Plan section)  Acute Rehab PT Goals Patient Stated Goal: return home PT Goal Formulation: All assessment and education complete, DC therapy    Frequency     Barriers to discharge        Co-evaluation               AM-PAC PT "6 Clicks" Mobility  Outcome Measure Help needed turning from your back to your side while in a flat bed without using bedrails?: None Help needed moving from lying on your back to sitting on the side of a flat bed without using bedrails?: None Help needed moving to and from a bed to a chair (including a wheelchair)?: A Little Help needed standing up from a chair using your arms (e.g., wheelchair or bedside chair)?: A Little Help needed to walk in hospital room?: A Little Help needed climbing 3-5 steps with a railing? : A Little 6 Click Score: 20    End of Session Equipment Utilized During Treatment: Back brace Activity Tolerance: Patient tolerated treatment well Patient left: in bed;with call bell/phone within reach Nurse Communication:  Mobility status PT Visit Diagnosis: Unsteadiness on feet (R26.81);Pain Pain - part of body: (back)    Time: [id number] PT Time Calculation (min) (ACUTE ONLY): 30 min   Charges:   PT Evaluation $PT Eval Low Complexity: 1 Low PT Treatments $Gait Training: 8-22 mins        Leighton Roach, [place]  Pager [phone] Office [place] 11/26/2019, 10:02 AM

## 2019-11-26 NOTE — Discharge Instructions (Signed)

## 2019-11-27 ENCOUNTER — Encounter: Payer: Self-pay | Admitting: *Deleted

## 2019-12-26 DIAGNOSIS — M5416 Radiculopathy, lumbar region: Secondary | ICD-10-CM | POA: Diagnosis not present

## 2020-01-13 DIAGNOSIS — H93293 Other abnormal auditory perceptions, bilateral: Secondary | ICD-10-CM | POA: Diagnosis not present

## 2020-01-13 DIAGNOSIS — H6123 Impacted cerumen, bilateral: Secondary | ICD-10-CM | POA: Diagnosis not present

## 2020-01-24 DIAGNOSIS — I480 Paroxysmal atrial fibrillation: Secondary | ICD-10-CM | POA: Diagnosis not present

## 2020-01-24 DIAGNOSIS — D649 Anemia, unspecified: Secondary | ICD-10-CM | POA: Diagnosis not present

## 2020-01-24 DIAGNOSIS — Z5181 Encounter for therapeutic drug level monitoring: Secondary | ICD-10-CM | POA: Diagnosis not present

## 2020-01-24 DIAGNOSIS — Z1159 Encounter for screening for other viral diseases: Secondary | ICD-10-CM | POA: Diagnosis not present

## 2020-01-24 DIAGNOSIS — R6881 Early satiety: Secondary | ICD-10-CM | POA: Diagnosis not present

## 2020-01-24 DIAGNOSIS — R634 Abnormal weight loss: Secondary | ICD-10-CM | POA: Diagnosis not present

## 2020-01-24 DIAGNOSIS — Z0000 Encounter for general adult medical examination without abnormal findings: Secondary | ICD-10-CM | POA: Diagnosis not present

## 2020-01-24 DIAGNOSIS — R1013 Epigastric pain: Secondary | ICD-10-CM | POA: Diagnosis not present

## 2020-01-29 DIAGNOSIS — R634 Abnormal weight loss: Secondary | ICD-10-CM | POA: Diagnosis not present

## 2020-01-29 DIAGNOSIS — R1310 Dysphagia, unspecified: Secondary | ICD-10-CM | POA: Diagnosis not present

## 2020-01-29 DIAGNOSIS — R6881 Early satiety: Secondary | ICD-10-CM | POA: Diagnosis not present

## 2020-01-29 DIAGNOSIS — R1013 Epigastric pain: Secondary | ICD-10-CM | POA: Diagnosis not present

## 2020-02-03 DIAGNOSIS — L578 Other skin changes due to chronic exposure to nonionizing radiation: Secondary | ICD-10-CM | POA: Diagnosis not present

## 2020-02-03 DIAGNOSIS — L299 Pruritus, unspecified: Secondary | ICD-10-CM | POA: Diagnosis not present

## 2020-02-03 DIAGNOSIS — L820 Inflamed seborrheic keratosis: Secondary | ICD-10-CM | POA: Diagnosis not present

## 2020-02-03 DIAGNOSIS — C44319 Basal cell carcinoma of skin of other parts of face: Secondary | ICD-10-CM | POA: Diagnosis not present

## 2020-02-03 DIAGNOSIS — D1801 Hemangioma of skin and subcutaneous tissue: Secondary | ICD-10-CM | POA: Diagnosis not present

## 2020-02-05 DIAGNOSIS — C61 Malignant neoplasm of prostate: Secondary | ICD-10-CM | POA: Diagnosis not present

## 2020-02-05 DIAGNOSIS — J984 Other disorders of lung: Secondary | ICD-10-CM | POA: Diagnosis not present

## 2020-02-05 DIAGNOSIS — R918 Other nonspecific abnormal finding of lung field: Secondary | ICD-10-CM | POA: Diagnosis not present

## 2020-02-05 DIAGNOSIS — R634 Abnormal weight loss: Secondary | ICD-10-CM | POA: Diagnosis not present

## 2020-02-05 DIAGNOSIS — J479 Bronchiectasis, uncomplicated: Secondary | ICD-10-CM | POA: Diagnosis not present

## 2020-02-07 DIAGNOSIS — C44319 Basal cell carcinoma of skin of other parts of face: Secondary | ICD-10-CM | POA: Diagnosis not present

## 2020-02-18 DIAGNOSIS — H35372 Puckering of macula, left eye: Secondary | ICD-10-CM | POA: Diagnosis not present

## 2020-02-18 DIAGNOSIS — H43812 Vitreous degeneration, left eye: Secondary | ICD-10-CM | POA: Diagnosis not present

## 2020-02-18 DIAGNOSIS — H34831 Tributary (branch) retinal vein occlusion, right eye, with macular edema: Secondary | ICD-10-CM | POA: Diagnosis not present

## 2020-02-20 DIAGNOSIS — Z79899 Other long term (current) drug therapy: Secondary | ICD-10-CM | POA: Diagnosis not present

## 2020-02-20 DIAGNOSIS — R42 Dizziness and giddiness: Secondary | ICD-10-CM | POA: Diagnosis not present

## 2020-02-25 DIAGNOSIS — M5416 Radiculopathy, lumbar region: Secondary | ICD-10-CM | POA: Diagnosis not present

## 2020-03-25 DIAGNOSIS — Z0000 Encounter for general adult medical examination without abnormal findings: Secondary | ICD-10-CM | POA: Diagnosis not present

## 2020-03-25 DIAGNOSIS — R42 Dizziness and giddiness: Secondary | ICD-10-CM | POA: Diagnosis not present

## 2020-03-31 DIAGNOSIS — Z01812 Encounter for preprocedural laboratory examination: Secondary | ICD-10-CM | POA: Diagnosis not present

## 2020-03-31 DIAGNOSIS — Z20828 Contact with and (suspected) exposure to other viral communicable diseases: Secondary | ICD-10-CM | POA: Diagnosis not present

## 2020-03-31 DIAGNOSIS — Z20822 Contact with and (suspected) exposure to covid-19: Secondary | ICD-10-CM | POA: Diagnosis not present

## 2020-04-07 DIAGNOSIS — K208 Other esophagitis without bleeding: Secondary | ICD-10-CM | POA: Diagnosis not present

## 2020-04-07 DIAGNOSIS — I480 Paroxysmal atrial fibrillation: Secondary | ICD-10-CM | POA: Diagnosis not present

## 2020-04-07 DIAGNOSIS — K209 Esophagitis, unspecified without bleeding: Secondary | ICD-10-CM | POA: Diagnosis not present

## 2020-04-07 DIAGNOSIS — R1310 Dysphagia, unspecified: Secondary | ICD-10-CM | POA: Diagnosis not present

## 2020-04-07 DIAGNOSIS — D130 Benign neoplasm of esophagus: Secondary | ICD-10-CM | POA: Diagnosis not present

## 2020-04-07 DIAGNOSIS — K222 Esophageal obstruction: Secondary | ICD-10-CM | POA: Diagnosis not present

## 2020-04-17 DIAGNOSIS — R42 Dizziness and giddiness: Secondary | ICD-10-CM | POA: Diagnosis not present

## 2020-05-14 DIAGNOSIS — I480 Paroxysmal atrial fibrillation: Secondary | ICD-10-CM | POA: Diagnosis not present

## 2020-05-14 DIAGNOSIS — R5383 Other fatigue: Secondary | ICD-10-CM | POA: Diagnosis not present

## 2020-05-14 DIAGNOSIS — J479 Bronchiectasis, uncomplicated: Secondary | ICD-10-CM | POA: Diagnosis not present

## 2020-05-14 DIAGNOSIS — R079 Chest pain, unspecified: Secondary | ICD-10-CM | POA: Diagnosis not present

## 2020-05-14 DIAGNOSIS — R42 Dizziness and giddiness: Secondary | ICD-10-CM | POA: Diagnosis not present

## 2020-05-14 DIAGNOSIS — D539 Nutritional anemia, unspecified: Secondary | ICD-10-CM | POA: Diagnosis not present

## 2020-05-14 DIAGNOSIS — R0600 Dyspnea, unspecified: Secondary | ICD-10-CM | POA: Diagnosis not present

## 2020-05-28 ENCOUNTER — Other Ambulatory Visit: Payer: Self-pay | Admitting: Student

## 2020-05-28 DIAGNOSIS — M545 Low back pain, unspecified: Secondary | ICD-10-CM

## 2020-06-02 DIAGNOSIS — R531 Weakness: Secondary | ICD-10-CM | POA: Diagnosis not present

## 2020-06-02 DIAGNOSIS — D539 Nutritional anemia, unspecified: Secondary | ICD-10-CM | POA: Diagnosis not present

## 2020-06-02 DIAGNOSIS — J479 Bronchiectasis, uncomplicated: Secondary | ICD-10-CM | POA: Diagnosis not present

## 2020-06-02 DIAGNOSIS — R0602 Shortness of breath: Secondary | ICD-10-CM | POA: Diagnosis not present

## 2020-06-11 DIAGNOSIS — Z7901 Long term (current) use of anticoagulants: Secondary | ICD-10-CM | POA: Diagnosis not present

## 2020-06-11 DIAGNOSIS — D539 Nutritional anemia, unspecified: Secondary | ICD-10-CM | POA: Diagnosis not present

## 2020-06-11 DIAGNOSIS — J984 Other disorders of lung: Secondary | ICD-10-CM | POA: Diagnosis not present

## 2020-06-11 DIAGNOSIS — R001 Bradycardia, unspecified: Secondary | ICD-10-CM | POA: Diagnosis not present

## 2020-06-11 DIAGNOSIS — I480 Paroxysmal atrial fibrillation: Secondary | ICD-10-CM | POA: Diagnosis not present

## 2020-06-11 DIAGNOSIS — I491 Atrial premature depolarization: Secondary | ICD-10-CM | POA: Diagnosis not present

## 2020-06-11 DIAGNOSIS — I959 Hypotension, unspecified: Secondary | ICD-10-CM | POA: Diagnosis not present

## 2020-06-16 ENCOUNTER — Ambulatory Visit
Admission: RE | Admit: 2020-06-16 | Discharge: 2020-06-16 | Disposition: A | Discharge status: Home / Self Care | Payer: PPO | Source: Ambulatory Visit | Attending: Student | Admitting: Student

## 2020-06-16 DIAGNOSIS — M5127 Other intervertebral disc displacement, lumbosacral region: Secondary | ICD-10-CM | POA: Diagnosis not present

## 2020-06-16 DIAGNOSIS — M48061 Spinal stenosis, lumbar region without neurogenic claudication: Secondary | ICD-10-CM | POA: Diagnosis not present

## 2020-06-16 DIAGNOSIS — M4807 Spinal stenosis, lumbosacral region: Secondary | ICD-10-CM | POA: Diagnosis not present

## 2020-06-16 DIAGNOSIS — M47816 Spondylosis without myelopathy or radiculopathy, lumbar region: Secondary | ICD-10-CM | POA: Diagnosis not present

## 2020-06-16 DIAGNOSIS — M545 Low back pain, unspecified: Secondary | ICD-10-CM

## 2020-06-23 DIAGNOSIS — H43812 Vitreous degeneration, left eye: Secondary | ICD-10-CM | POA: Diagnosis not present

## 2020-06-23 DIAGNOSIS — H34831 Tributary (branch) retinal vein occlusion, right eye, with macular edema: Secondary | ICD-10-CM | POA: Diagnosis not present

## 2020-06-23 DIAGNOSIS — H35372 Puckering of macula, left eye: Secondary | ICD-10-CM | POA: Diagnosis not present

## 2020-06-30 DIAGNOSIS — M545 Low back pain: Secondary | ICD-10-CM | POA: Diagnosis not present

## 2020-07-01 ENCOUNTER — Other Ambulatory Visit: Payer: Self-pay | Admitting: Student

## 2020-07-01 DIAGNOSIS — M545 Low back pain, unspecified: Secondary | ICD-10-CM

## 2020-07-06 ENCOUNTER — Other Ambulatory Visit: Payer: Self-pay | Admitting: Neurology

## 2020-07-08 DIAGNOSIS — D539 Nutritional anemia, unspecified: Secondary | ICD-10-CM | POA: Diagnosis not present

## 2020-07-08 DIAGNOSIS — R42 Dizziness and giddiness: Secondary | ICD-10-CM | POA: Diagnosis not present

## 2020-07-08 DIAGNOSIS — R0989 Other specified symptoms and signs involving the circulatory and respiratory systems: Secondary | ICD-10-CM | POA: Diagnosis not present

## 2020-07-15 ENCOUNTER — Other Ambulatory Visit: Payer: Self-pay | Admitting: Student

## 2020-07-15 ENCOUNTER — Ambulatory Visit
Admission: RE | Admit: 2020-07-15 | Discharge: 2020-07-15 | Disposition: A | Discharge status: Home / Self Care | Payer: PPO | Source: Ambulatory Visit | Attending: Student | Admitting: Student

## 2020-07-15 ENCOUNTER — Other Ambulatory Visit: Payer: Self-pay

## 2020-07-15 DIAGNOSIS — M545 Low back pain, unspecified: Secondary | ICD-10-CM

## 2020-07-15 MED ORDER — IOPAMIDOL (ISOVUE-M 200) INJECTION 41%
1.0000 mL | Freq: Once | INTRAMUSCULAR | Status: AC
  Administered 2020-07-15: 1 mL via EPIDURAL

## 2020-07-15 MED ORDER — METHYLPREDNISOLONE ACETATE 40 MG/ML INJ SUSP (RADIOLOG
120.0000 mg | Freq: Once | INTRAMUSCULAR | Status: AC
  Administered 2020-07-15: 120 mg via EPIDURAL

## 2020-07-15 NOTE — Discharge Instructions (Signed)
Post Procedure Spinal Discharge Instruction Sheet  1. You may resume a regular diet and any medications that you routinely take (including pain medications).  2. No driving day of procedure.  3. Light activity throughout the rest of the day.  Do not do any strenuous work, exercise, bending or lifting.  The day following the procedure, you can resume normal physical activity but you should refrain from exercising or physical therapy for at least three days thereafter.   Common Side Effects:   Headaches- take your usual medications as directed by your physician.  Increase your fluid intake.  Caffeinated beverages may be helpful.  Lie flat in bed until your headache resolves.   Restlessness or inability to sleep- you may have trouble sleeping for the next few days.  Ask your referring physician if you need any medication for sleep.   Facial flushing or redness- should subside within a few days.   Increased pain- a temporary increase in pain a day or two following your procedure is not unusual.  Take your pain medication as prescribed by your referring physician.   Leg cramps  Please contact our office at [phone] for the following symptoms:  Fever greater than 100 degrees.  Headaches unresolved with medication after 2-3 days.  Increased swelling, pain, or redness at injection site.  YOU MAY RESUME YOUR ELIQUIS TOMORROW 07/16/2020 AT 0930 AM

## 2020-07-23 DIAGNOSIS — I081 Rheumatic disorders of both mitral and tricuspid valves: Secondary | ICD-10-CM | POA: Diagnosis not present

## 2020-07-23 DIAGNOSIS — I272 Pulmonary hypertension, unspecified: Secondary | ICD-10-CM | POA: Diagnosis not present

## 2020-07-30 DIAGNOSIS — M5416 Radiculopathy, lumbar region: Secondary | ICD-10-CM | POA: Diagnosis not present

## 2020-08-11 DIAGNOSIS — I480 Paroxysmal atrial fibrillation: Secondary | ICD-10-CM | POA: Diagnosis not present

## 2020-08-14 DIAGNOSIS — R0600 Dyspnea, unspecified: Secondary | ICD-10-CM | POA: Diagnosis not present

## 2020-08-14 DIAGNOSIS — I480 Paroxysmal atrial fibrillation: Secondary | ICD-10-CM | POA: Diagnosis not present

## 2020-08-14 DIAGNOSIS — R42 Dizziness and giddiness: Secondary | ICD-10-CM | POA: Diagnosis not present

## 2020-08-14 DIAGNOSIS — K219 Gastro-esophageal reflux disease without esophagitis: Secondary | ICD-10-CM | POA: Diagnosis not present

## 2020-08-14 DIAGNOSIS — Z23 Encounter for immunization: Secondary | ICD-10-CM | POA: Diagnosis not present

## 2020-08-17 DIAGNOSIS — I471 Supraventricular tachycardia: Secondary | ICD-10-CM | POA: Diagnosis not present

## 2020-08-17 DIAGNOSIS — I493 Ventricular premature depolarization: Secondary | ICD-10-CM | POA: Diagnosis not present

## 2020-08-25 DIAGNOSIS — I480 Paroxysmal atrial fibrillation: Secondary | ICD-10-CM | POA: Diagnosis not present

## 2020-08-25 DIAGNOSIS — J984 Other disorders of lung: Secondary | ICD-10-CM | POA: Diagnosis not present

## 2020-08-25 DIAGNOSIS — M47812 Spondylosis without myelopathy or radiculopathy, cervical region: Secondary | ICD-10-CM | POA: Diagnosis not present

## 2020-08-25 DIAGNOSIS — C61 Malignant neoplasm of prostate: Secondary | ICD-10-CM | POA: Diagnosis not present

## 2020-08-25 DIAGNOSIS — R0600 Dyspnea, unspecified: Secondary | ICD-10-CM | POA: Diagnosis not present

## 2020-08-25 DIAGNOSIS — J479 Bronchiectasis, uncomplicated: Secondary | ICD-10-CM | POA: Diagnosis not present

## 2020-08-25 DIAGNOSIS — I9589 Other hypotension: Secondary | ICD-10-CM | POA: Diagnosis not present

## 2020-08-25 DIAGNOSIS — Z7901 Long term (current) use of anticoagulants: Secondary | ICD-10-CM | POA: Diagnosis not present

## 2020-08-25 DIAGNOSIS — M5416 Radiculopathy, lumbar region: Secondary | ICD-10-CM | POA: Diagnosis not present

## 2020-08-25 DIAGNOSIS — D539 Nutritional anemia, unspecified: Secondary | ICD-10-CM | POA: Diagnosis not present

## 2020-08-25 DIAGNOSIS — I471 Supraventricular tachycardia: Secondary | ICD-10-CM | POA: Diagnosis not present

## 2020-08-25 DIAGNOSIS — R001 Bradycardia, unspecified: Secondary | ICD-10-CM | POA: Diagnosis not present

## 2020-09-01 DIAGNOSIS — J479 Bronchiectasis, uncomplicated: Secondary | ICD-10-CM | POA: Diagnosis not present

## 2020-09-01 DIAGNOSIS — J984 Other disorders of lung: Secondary | ICD-10-CM | POA: Diagnosis not present

## 2020-09-01 DIAGNOSIS — C61 Malignant neoplasm of prostate: Secondary | ICD-10-CM | POA: Diagnosis not present

## 2020-09-01 DIAGNOSIS — D539 Nutritional anemia, unspecified: Secondary | ICD-10-CM | POA: Diagnosis not present

## 2020-09-01 DIAGNOSIS — Z7901 Long term (current) use of anticoagulants: Secondary | ICD-10-CM | POA: Diagnosis not present

## 2020-09-01 DIAGNOSIS — I480 Paroxysmal atrial fibrillation: Secondary | ICD-10-CM | POA: Diagnosis not present

## 2020-09-01 DIAGNOSIS — R0600 Dyspnea, unspecified: Secondary | ICD-10-CM | POA: Diagnosis not present

## 2020-09-01 DIAGNOSIS — I471 Supraventricular tachycardia: Secondary | ICD-10-CM | POA: Diagnosis not present

## 2020-09-01 DIAGNOSIS — I9589 Other hypotension: Secondary | ICD-10-CM | POA: Diagnosis not present

## 2020-09-01 DIAGNOSIS — R001 Bradycardia, unspecified: Secondary | ICD-10-CM | POA: Diagnosis not present

## 2020-09-03 DIAGNOSIS — M545 Low back pain: Secondary | ICD-10-CM | POA: Diagnosis not present

## 2020-09-03 DIAGNOSIS — M5416 Radiculopathy, lumbar region: Secondary | ICD-10-CM | POA: Diagnosis not present

## 2020-09-08 DIAGNOSIS — M545 Low back pain: Secondary | ICD-10-CM | POA: Diagnosis not present

## 2020-09-14 DIAGNOSIS — J439 Emphysema, unspecified: Secondary | ICD-10-CM | POA: Diagnosis not present

## 2020-09-14 DIAGNOSIS — R0600 Dyspnea, unspecified: Secondary | ICD-10-CM | POA: Diagnosis not present

## 2020-09-14 DIAGNOSIS — Z01818 Encounter for other preprocedural examination: Secondary | ICD-10-CM | POA: Diagnosis not present

## 2020-09-14 DIAGNOSIS — I480 Paroxysmal atrial fibrillation: Secondary | ICD-10-CM | POA: Diagnosis not present

## 2020-09-14 DIAGNOSIS — J90 Pleural effusion, not elsewhere classified: Secondary | ICD-10-CM | POA: Diagnosis not present

## 2020-09-14 DIAGNOSIS — Z950 Presence of cardiac pacemaker: Secondary | ICD-10-CM | POA: Diagnosis not present

## 2020-09-21 DIAGNOSIS — I480 Paroxysmal atrial fibrillation: Secondary | ICD-10-CM | POA: Diagnosis not present

## 2020-09-21 DIAGNOSIS — I471 Supraventricular tachycardia: Secondary | ICD-10-CM | POA: Diagnosis not present

## 2020-09-21 DIAGNOSIS — I951 Orthostatic hypotension: Secondary | ICD-10-CM | POA: Diagnosis not present

## 2020-09-21 DIAGNOSIS — J984 Other disorders of lung: Secondary | ICD-10-CM | POA: Diagnosis not present

## 2020-09-21 DIAGNOSIS — Z7901 Long term (current) use of anticoagulants: Secondary | ICD-10-CM | POA: Diagnosis not present

## 2020-09-21 DIAGNOSIS — R918 Other nonspecific abnormal finding of lung field: Secondary | ICD-10-CM | POA: Diagnosis not present

## 2020-09-21 DIAGNOSIS — Z87891 Personal history of nicotine dependence: Secondary | ICD-10-CM | POA: Diagnosis not present

## 2020-09-21 DIAGNOSIS — Z950 Presence of cardiac pacemaker: Secondary | ICD-10-CM | POA: Diagnosis not present

## 2020-09-21 DIAGNOSIS — I495 Sick sinus syndrome: Secondary | ICD-10-CM | POA: Diagnosis not present

## 2020-09-22 DIAGNOSIS — J9811 Atelectasis: Secondary | ICD-10-CM | POA: Diagnosis not present

## 2020-09-22 DIAGNOSIS — R079 Chest pain, unspecified: Secondary | ICD-10-CM | POA: Diagnosis not present

## 2020-10-05 DIAGNOSIS — Z950 Presence of cardiac pacemaker: Secondary | ICD-10-CM | POA: Diagnosis not present

## 2020-10-05 DIAGNOSIS — R7989 Other specified abnormal findings of blood chemistry: Secondary | ICD-10-CM | POA: Diagnosis not present

## 2020-10-05 DIAGNOSIS — D539 Nutritional anemia, unspecified: Secondary | ICD-10-CM | POA: Diagnosis not present

## 2020-10-14 DIAGNOSIS — M461 Sacroiliitis, not elsewhere classified: Secondary | ICD-10-CM | POA: Diagnosis not present

## 2020-10-14 DIAGNOSIS — R030 Elevated blood-pressure reading, without diagnosis of hypertension: Secondary | ICD-10-CM | POA: Diagnosis not present

## 2020-10-14 DIAGNOSIS — M545 Low back pain, unspecified: Secondary | ICD-10-CM | POA: Diagnosis not present

## 2020-10-15 DIAGNOSIS — M47812 Spondylosis without myelopathy or radiculopathy, cervical region: Secondary | ICD-10-CM | POA: Diagnosis not present

## 2020-10-15 DIAGNOSIS — Z950 Presence of cardiac pacemaker: Secondary | ICD-10-CM | POA: Diagnosis not present

## 2020-10-15 DIAGNOSIS — I4891 Unspecified atrial fibrillation: Secondary | ICD-10-CM | POA: Diagnosis not present

## 2020-10-15 DIAGNOSIS — R55 Syncope and collapse: Secondary | ICD-10-CM | POA: Diagnosis not present

## 2020-10-15 DIAGNOSIS — Z87891 Personal history of nicotine dependence: Secondary | ICD-10-CM | POA: Diagnosis not present

## 2020-10-15 DIAGNOSIS — M2578 Osteophyte, vertebrae: Secondary | ICD-10-CM | POA: Diagnosis not present

## 2020-10-15 DIAGNOSIS — W19XXXA Unspecified fall, initial encounter: Secondary | ICD-10-CM | POA: Diagnosis not present

## 2020-10-15 DIAGNOSIS — D539 Nutritional anemia, unspecified: Secondary | ICD-10-CM | POA: Diagnosis not present

## 2020-10-15 DIAGNOSIS — R42 Dizziness and giddiness: Secondary | ICD-10-CM | POA: Diagnosis not present

## 2020-10-15 DIAGNOSIS — Z7901 Long term (current) use of anticoagulants: Secondary | ICD-10-CM | POA: Diagnosis not present

## 2020-10-15 DIAGNOSIS — M6281 Muscle weakness (generalized): Secondary | ICD-10-CM | POA: Diagnosis not present

## 2020-10-15 DIAGNOSIS — Z8546 Personal history of malignant neoplasm of prostate: Secondary | ICD-10-CM | POA: Diagnosis not present

## 2020-10-15 DIAGNOSIS — N183 Chronic kidney disease, stage 3 unspecified: Secondary | ICD-10-CM | POA: Diagnosis not present

## 2020-10-15 DIAGNOSIS — M4802 Spinal stenosis, cervical region: Secondary | ICD-10-CM | POA: Diagnosis not present

## 2020-10-15 DIAGNOSIS — I495 Sick sinus syndrome: Secondary | ICD-10-CM | POA: Diagnosis not present

## 2020-10-15 DIAGNOSIS — I4589 Other specified conduction disorders: Secondary | ICD-10-CM | POA: Diagnosis not present

## 2020-10-15 DIAGNOSIS — I482 Chronic atrial fibrillation, unspecified: Secondary | ICD-10-CM | POA: Diagnosis not present

## 2020-10-16 DIAGNOSIS — Z7901 Long term (current) use of anticoagulants: Secondary | ICD-10-CM | POA: Diagnosis not present

## 2020-10-16 DIAGNOSIS — I4589 Other specified conduction disorders: Secondary | ICD-10-CM | POA: Diagnosis not present

## 2020-10-16 DIAGNOSIS — R55 Syncope and collapse: Secondary | ICD-10-CM | POA: Diagnosis not present

## 2020-10-16 DIAGNOSIS — I482 Chronic atrial fibrillation, unspecified: Secondary | ICD-10-CM | POA: Diagnosis not present

## 2020-10-16 DIAGNOSIS — Z950 Presence of cardiac pacemaker: Secondary | ICD-10-CM | POA: Diagnosis not present

## 2020-11-17 DIAGNOSIS — M461 Sacroiliitis, not elsewhere classified: Secondary | ICD-10-CM | POA: Diagnosis not present

## 2020-11-24 DIAGNOSIS — L570 Actinic keratosis: Secondary | ICD-10-CM | POA: Diagnosis not present

## 2020-12-07 DIAGNOSIS — M461 Sacroiliitis, not elsewhere classified: Secondary | ICD-10-CM | POA: Diagnosis not present

## 2020-12-07 DIAGNOSIS — M5416 Radiculopathy, lumbar region: Secondary | ICD-10-CM | POA: Diagnosis not present

## 2021-01-05 DIAGNOSIS — H34831 Tributary (branch) retinal vein occlusion, right eye, with macular edema: Secondary | ICD-10-CM | POA: Diagnosis not present

## 2021-01-05 DIAGNOSIS — H43812 Vitreous degeneration, left eye: Secondary | ICD-10-CM | POA: Diagnosis not present

## 2021-01-05 DIAGNOSIS — H35372 Puckering of macula, left eye: Secondary | ICD-10-CM | POA: Diagnosis not present

## 2021-01-08 DIAGNOSIS — D539 Nutritional anemia, unspecified: Secondary | ICD-10-CM | POA: Diagnosis not present

## 2021-01-21 DIAGNOSIS — Z0000 Encounter for general adult medical examination without abnormal findings: Secondary | ICD-10-CM | POA: Diagnosis not present

## 2021-01-21 DIAGNOSIS — R42 Dizziness and giddiness: Secondary | ICD-10-CM | POA: Diagnosis not present

## 2021-01-21 DIAGNOSIS — F32A Depression, unspecified: Secondary | ICD-10-CM | POA: Diagnosis not present

## 2021-01-21 DIAGNOSIS — G4700 Insomnia, unspecified: Secondary | ICD-10-CM | POA: Diagnosis not present

## 2021-01-26 DIAGNOSIS — M461 Sacroiliitis, not elsewhere classified: Secondary | ICD-10-CM | POA: Diagnosis not present

## 2021-01-26 DIAGNOSIS — M5416 Radiculopathy, lumbar region: Secondary | ICD-10-CM | POA: Diagnosis not present

## 2021-01-28 DIAGNOSIS — R42 Dizziness and giddiness: Secondary | ICD-10-CM | POA: Diagnosis not present

## 2021-01-28 DIAGNOSIS — H938X3 Other specified disorders of ear, bilateral: Secondary | ICD-10-CM | POA: Diagnosis not present

## 2021-02-02 DIAGNOSIS — Z45018 Encounter for adjustment and management of other part of cardiac pacemaker: Secondary | ICD-10-CM | POA: Diagnosis not present

## 2021-02-02 DIAGNOSIS — I471 Supraventricular tachycardia: Secondary | ICD-10-CM | POA: Diagnosis not present

## 2021-02-02 DIAGNOSIS — I9589 Other hypotension: Secondary | ICD-10-CM | POA: Diagnosis not present

## 2021-02-02 DIAGNOSIS — R42 Dizziness and giddiness: Secondary | ICD-10-CM | POA: Diagnosis not present

## 2021-02-15 DIAGNOSIS — Z8546 Personal history of malignant neoplasm of prostate: Secondary | ICD-10-CM | POA: Diagnosis not present

## 2021-02-15 DIAGNOSIS — N200 Calculus of kidney: Secondary | ICD-10-CM | POA: Diagnosis not present

## 2021-02-15 DIAGNOSIS — K5730 Diverticulosis of large intestine without perforation or abscess without bleeding: Secondary | ICD-10-CM | POA: Diagnosis not present

## 2021-02-15 DIAGNOSIS — N281 Cyst of kidney, acquired: Secondary | ICD-10-CM | POA: Diagnosis not present

## 2021-03-09 DIAGNOSIS — Z45018 Encounter for adjustment and management of other part of cardiac pacemaker: Secondary | ICD-10-CM | POA: Diagnosis not present

## 2021-03-09 DIAGNOSIS — I9589 Other hypotension: Secondary | ICD-10-CM | POA: Diagnosis not present

## 2021-03-09 DIAGNOSIS — R001 Bradycardia, unspecified: Secondary | ICD-10-CM | POA: Diagnosis not present

## 2021-03-09 DIAGNOSIS — I471 Supraventricular tachycardia: Secondary | ICD-10-CM | POA: Diagnosis not present

## 2021-03-10 DIAGNOSIS — R002 Palpitations: Secondary | ICD-10-CM | POA: Diagnosis not present

## 2021-03-16 DIAGNOSIS — I4510 Unspecified right bundle-branch block: Secondary | ICD-10-CM | POA: Diagnosis not present

## 2021-03-16 DIAGNOSIS — N200 Calculus of kidney: Secondary | ICD-10-CM | POA: Diagnosis not present

## 2021-03-16 DIAGNOSIS — I959 Hypotension, unspecified: Secondary | ICD-10-CM | POA: Diagnosis not present

## 2021-03-16 DIAGNOSIS — R0902 Hypoxemia: Secondary | ICD-10-CM | POA: Diagnosis not present

## 2021-03-16 DIAGNOSIS — G2571 Drug induced akathisia: Secondary | ICD-10-CM | POA: Diagnosis not present

## 2021-03-22 DIAGNOSIS — R42 Dizziness and giddiness: Secondary | ICD-10-CM | POA: Diagnosis not present

## 2021-03-25 DIAGNOSIS — M461 Sacroiliitis, not elsewhere classified: Secondary | ICD-10-CM | POA: Diagnosis not present

## 2021-03-25 DIAGNOSIS — M5416 Radiculopathy, lumbar region: Secondary | ICD-10-CM | POA: Diagnosis not present

## 2021-03-25 DIAGNOSIS — M961 Postlaminectomy syndrome, not elsewhere classified: Secondary | ICD-10-CM | POA: Diagnosis not present

## 2021-03-31 DIAGNOSIS — N200 Calculus of kidney: Secondary | ICD-10-CM | POA: Diagnosis not present

## 2021-04-12 DIAGNOSIS — R0789 Other chest pain: Secondary | ICD-10-CM | POA: Diagnosis not present

## 2021-04-12 DIAGNOSIS — R531 Weakness: Secondary | ICD-10-CM | POA: Diagnosis not present

## 2021-04-12 DIAGNOSIS — R0600 Dyspnea, unspecified: Secondary | ICD-10-CM | POA: Diagnosis not present

## 2021-04-12 DIAGNOSIS — R5383 Other fatigue: Secondary | ICD-10-CM | POA: Diagnosis not present

## 2021-04-22 DIAGNOSIS — G894 Chronic pain syndrome: Secondary | ICD-10-CM | POA: Diagnosis not present

## 2021-04-28 DIAGNOSIS — R42 Dizziness and giddiness: Secondary | ICD-10-CM | POA: Diagnosis not present

## 2021-05-21 DIAGNOSIS — M47812 Spondylosis without myelopathy or radiculopathy, cervical region: Secondary | ICD-10-CM | POA: Diagnosis not present

## 2021-05-21 DIAGNOSIS — M47816 Spondylosis without myelopathy or radiculopathy, lumbar region: Secondary | ICD-10-CM | POA: Diagnosis not present

## 2021-05-21 DIAGNOSIS — M4802 Spinal stenosis, cervical region: Secondary | ICD-10-CM | POA: Diagnosis not present

## 2021-05-21 DIAGNOSIS — M792 Neuralgia and neuritis, unspecified: Secondary | ICD-10-CM | POA: Diagnosis not present

## 2021-05-21 DIAGNOSIS — M48061 Spinal stenosis, lumbar region without neurogenic claudication: Secondary | ICD-10-CM | POA: Diagnosis not present

## 2021-05-21 DIAGNOSIS — M19012 Primary osteoarthritis, left shoulder: Secondary | ICD-10-CM | POA: Diagnosis not present

## 2021-05-21 DIAGNOSIS — M25512 Pain in left shoulder: Secondary | ICD-10-CM | POA: Diagnosis not present

## 2021-05-21 DIAGNOSIS — G8929 Other chronic pain: Secondary | ICD-10-CM | POA: Diagnosis not present

## 2021-05-28 DIAGNOSIS — Z981 Arthrodesis status: Secondary | ICD-10-CM | POA: Diagnosis not present

## 2021-05-28 DIAGNOSIS — R079 Chest pain, unspecified: Secondary | ICD-10-CM | POA: Diagnosis not present

## 2021-05-28 DIAGNOSIS — Z96643 Presence of artificial hip joint, bilateral: Secondary | ICD-10-CM | POA: Diagnosis not present

## 2021-05-28 DIAGNOSIS — R002 Palpitations: Secondary | ICD-10-CM | POA: Diagnosis not present

## 2021-05-28 DIAGNOSIS — Z7901 Long term (current) use of anticoagulants: Secondary | ICD-10-CM | POA: Diagnosis not present

## 2021-05-28 DIAGNOSIS — Z87442 Personal history of urinary calculi: Secondary | ICD-10-CM | POA: Diagnosis not present

## 2021-05-28 DIAGNOSIS — I480 Paroxysmal atrial fibrillation: Secondary | ICD-10-CM | POA: Diagnosis not present

## 2021-05-28 DIAGNOSIS — I4510 Unspecified right bundle-branch block: Secondary | ICD-10-CM | POA: Diagnosis not present

## 2021-05-28 DIAGNOSIS — I471 Supraventricular tachycardia: Secondary | ICD-10-CM | POA: Diagnosis not present

## 2021-05-28 DIAGNOSIS — Z800 Family history of malignant neoplasm of digestive organs: Secondary | ICD-10-CM | POA: Diagnosis not present

## 2021-05-28 DIAGNOSIS — Z8546 Personal history of malignant neoplasm of prostate: Secondary | ICD-10-CM | POA: Diagnosis not present

## 2021-05-28 DIAGNOSIS — I440 Atrioventricular block, first degree: Secondary | ICD-10-CM | POA: Diagnosis not present

## 2021-05-28 DIAGNOSIS — Z950 Presence of cardiac pacemaker: Secondary | ICD-10-CM | POA: Diagnosis not present

## 2021-05-28 DIAGNOSIS — R000 Tachycardia, unspecified: Secondary | ICD-10-CM | POA: Diagnosis not present

## 2021-05-28 DIAGNOSIS — I2510 Atherosclerotic heart disease of native coronary artery without angina pectoris: Secondary | ICD-10-CM | POA: Diagnosis not present

## 2021-05-28 DIAGNOSIS — J984 Other disorders of lung: Secondary | ICD-10-CM | POA: Diagnosis not present

## 2021-05-28 DIAGNOSIS — Z87891 Personal history of nicotine dependence: Secondary | ICD-10-CM | POA: Diagnosis not present

## 2021-05-28 DIAGNOSIS — J449 Chronic obstructive pulmonary disease, unspecified: Secondary | ICD-10-CM | POA: Diagnosis not present

## 2021-05-28 DIAGNOSIS — Z79899 Other long term (current) drug therapy: Secondary | ICD-10-CM | POA: Diagnosis not present

## 2021-05-28 DIAGNOSIS — Z8719 Personal history of other diseases of the digestive system: Secondary | ICD-10-CM | POA: Diagnosis not present

## 2021-06-02 DIAGNOSIS — I471 Supraventricular tachycardia: Secondary | ICD-10-CM | POA: Diagnosis not present

## 2021-06-02 DIAGNOSIS — J479 Bronchiectasis, uncomplicated: Secondary | ICD-10-CM | POA: Diagnosis not present

## 2021-06-02 DIAGNOSIS — J984 Other disorders of lung: Secondary | ICD-10-CM | POA: Diagnosis not present

## 2021-06-02 DIAGNOSIS — E875 Hyperkalemia: Secondary | ICD-10-CM | POA: Diagnosis not present

## 2021-06-05 DIAGNOSIS — R0902 Hypoxemia: Secondary | ICD-10-CM | POA: Diagnosis not present

## 2021-06-05 DIAGNOSIS — G2581 Restless legs syndrome: Secondary | ICD-10-CM | POA: Diagnosis not present

## 2021-06-05 DIAGNOSIS — Z79899 Other long term (current) drug therapy: Secondary | ICD-10-CM | POA: Diagnosis not present

## 2021-06-05 DIAGNOSIS — R079 Chest pain, unspecified: Secondary | ICD-10-CM | POA: Diagnosis not present

## 2021-06-05 DIAGNOSIS — R0602 Shortness of breath: Secondary | ICD-10-CM | POA: Diagnosis not present

## 2021-06-05 DIAGNOSIS — R0789 Other chest pain: Secondary | ICD-10-CM | POA: Diagnosis not present

## 2021-06-05 DIAGNOSIS — D649 Anemia, unspecified: Secondary | ICD-10-CM | POA: Diagnosis not present

## 2021-06-05 DIAGNOSIS — Z950 Presence of cardiac pacemaker: Secondary | ICD-10-CM | POA: Diagnosis not present

## 2021-06-05 DIAGNOSIS — I480 Paroxysmal atrial fibrillation: Secondary | ICD-10-CM | POA: Diagnosis not present

## 2021-06-05 DIAGNOSIS — R1013 Epigastric pain: Secondary | ICD-10-CM | POA: Diagnosis not present

## 2021-06-05 DIAGNOSIS — J984 Other disorders of lung: Secondary | ICD-10-CM | POA: Diagnosis not present

## 2021-06-05 DIAGNOSIS — I4510 Unspecified right bundle-branch block: Secondary | ICD-10-CM | POA: Diagnosis not present

## 2021-06-05 DIAGNOSIS — I4811 Longstanding persistent atrial fibrillation: Secondary | ICD-10-CM | POA: Diagnosis not present

## 2021-06-05 DIAGNOSIS — I2510 Atherosclerotic heart disease of native coronary artery without angina pectoris: Secondary | ICD-10-CM | POA: Diagnosis not present

## 2021-06-05 DIAGNOSIS — Z87891 Personal history of nicotine dependence: Secondary | ICD-10-CM | POA: Diagnosis not present

## 2021-06-05 DIAGNOSIS — Z7901 Long term (current) use of anticoagulants: Secondary | ICD-10-CM | POA: Diagnosis not present

## 2021-06-05 DIAGNOSIS — I959 Hypotension, unspecified: Secondary | ICD-10-CM | POA: Diagnosis not present

## 2021-06-05 DIAGNOSIS — N183 Chronic kidney disease, stage 3 unspecified: Secondary | ICD-10-CM | POA: Diagnosis not present

## 2021-06-06 DIAGNOSIS — R002 Palpitations: Secondary | ICD-10-CM | POA: Diagnosis not present

## 2021-06-06 DIAGNOSIS — I495 Sick sinus syndrome: Secondary | ICD-10-CM | POA: Diagnosis not present

## 2021-06-06 DIAGNOSIS — G2581 Restless legs syndrome: Secondary | ICD-10-CM | POA: Diagnosis not present

## 2021-06-06 DIAGNOSIS — R0789 Other chest pain: Secondary | ICD-10-CM | POA: Diagnosis not present

## 2021-06-06 DIAGNOSIS — Z950 Presence of cardiac pacemaker: Secondary | ICD-10-CM | POA: Diagnosis not present

## 2021-06-06 DIAGNOSIS — I480 Paroxysmal atrial fibrillation: Secondary | ICD-10-CM | POA: Diagnosis not present

## 2021-06-06 DIAGNOSIS — N183 Chronic kidney disease, stage 3 unspecified: Secondary | ICD-10-CM | POA: Diagnosis not present

## 2021-06-06 DIAGNOSIS — R079 Chest pain, unspecified: Secondary | ICD-10-CM | POA: Diagnosis not present

## 2021-06-06 DIAGNOSIS — I959 Hypotension, unspecified: Secondary | ICD-10-CM | POA: Diagnosis not present

## 2021-06-07 DIAGNOSIS — Z7901 Long term (current) use of anticoagulants: Secondary | ICD-10-CM | POA: Diagnosis not present

## 2021-06-07 DIAGNOSIS — I495 Sick sinus syndrome: Secondary | ICD-10-CM | POA: Diagnosis not present

## 2021-06-07 DIAGNOSIS — I491 Atrial premature depolarization: Secondary | ICD-10-CM | POA: Diagnosis not present

## 2021-06-07 DIAGNOSIS — N183 Chronic kidney disease, stage 3 unspecified: Secondary | ICD-10-CM | POA: Diagnosis not present

## 2021-06-07 DIAGNOSIS — Z9889 Other specified postprocedural states: Secondary | ICD-10-CM | POA: Diagnosis not present

## 2021-06-07 DIAGNOSIS — R0789 Other chest pain: Secondary | ICD-10-CM | POA: Diagnosis not present

## 2021-06-07 DIAGNOSIS — Z950 Presence of cardiac pacemaker: Secondary | ICD-10-CM | POA: Diagnosis not present

## 2021-06-07 DIAGNOSIS — G2581 Restless legs syndrome: Secondary | ICD-10-CM | POA: Diagnosis not present

## 2021-06-07 DIAGNOSIS — I480 Paroxysmal atrial fibrillation: Secondary | ICD-10-CM | POA: Diagnosis not present

## 2021-06-07 DIAGNOSIS — I4510 Unspecified right bundle-branch block: Secondary | ICD-10-CM | POA: Diagnosis not present

## 2021-06-07 DIAGNOSIS — Z79899 Other long term (current) drug therapy: Secondary | ICD-10-CM | POA: Diagnosis not present

## 2021-06-07 DIAGNOSIS — I951 Orthostatic hypotension: Secondary | ICD-10-CM | POA: Diagnosis not present

## 2021-06-07 DIAGNOSIS — R1013 Epigastric pain: Secondary | ICD-10-CM | POA: Diagnosis not present

## 2021-06-07 DIAGNOSIS — R079 Chest pain, unspecified: Secondary | ICD-10-CM | POA: Diagnosis not present

## 2021-06-10 DIAGNOSIS — Z950 Presence of cardiac pacemaker: Secondary | ICD-10-CM | POA: Diagnosis not present

## 2021-06-15 DIAGNOSIS — Z45018 Encounter for adjustment and management of other part of cardiac pacemaker: Secondary | ICD-10-CM | POA: Diagnosis not present

## 2021-06-15 DIAGNOSIS — J984 Other disorders of lung: Secondary | ICD-10-CM | POA: Diagnosis not present

## 2021-06-15 DIAGNOSIS — R0600 Dyspnea, unspecified: Secondary | ICD-10-CM | POA: Diagnosis not present

## 2021-06-15 DIAGNOSIS — R001 Bradycardia, unspecified: Secondary | ICD-10-CM | POA: Diagnosis not present

## 2021-06-15 DIAGNOSIS — I4819 Other persistent atrial fibrillation: Secondary | ICD-10-CM | POA: Diagnosis not present

## 2021-06-15 DIAGNOSIS — I471 Supraventricular tachycardia: Secondary | ICD-10-CM | POA: Diagnosis not present

## 2021-06-15 DIAGNOSIS — C61 Malignant neoplasm of prostate: Secondary | ICD-10-CM | POA: Diagnosis not present

## 2021-06-15 DIAGNOSIS — I9589 Other hypotension: Secondary | ICD-10-CM | POA: Diagnosis not present

## 2021-06-15 DIAGNOSIS — J479 Bronchiectasis, uncomplicated: Secondary | ICD-10-CM | POA: Diagnosis not present

## 2021-06-28 DIAGNOSIS — I440 Atrioventricular block, first degree: Secondary | ICD-10-CM | POA: Diagnosis not present

## 2021-06-28 DIAGNOSIS — I4510 Unspecified right bundle-branch block: Secondary | ICD-10-CM | POA: Diagnosis not present

## 2021-06-28 DIAGNOSIS — I4819 Other persistent atrial fibrillation: Secondary | ICD-10-CM | POA: Diagnosis not present

## 2021-06-28 DIAGNOSIS — I480 Paroxysmal atrial fibrillation: Secondary | ICD-10-CM | POA: Diagnosis not present

## 2021-07-09 DIAGNOSIS — I4819 Other persistent atrial fibrillation: Secondary | ICD-10-CM | POA: Diagnosis not present

## 2021-07-09 DIAGNOSIS — R42 Dizziness and giddiness: Secondary | ICD-10-CM | POA: Diagnosis not present

## 2021-07-09 DIAGNOSIS — I471 Supraventricular tachycardia: Secondary | ICD-10-CM | POA: Diagnosis not present

## 2021-07-09 DIAGNOSIS — I4510 Unspecified right bundle-branch block: Secondary | ICD-10-CM | POA: Diagnosis not present

## 2021-07-09 DIAGNOSIS — I9589 Other hypotension: Secondary | ICD-10-CM | POA: Diagnosis not present

## 2021-07-09 DIAGNOSIS — Z45018 Encounter for adjustment and management of other part of cardiac pacemaker: Secondary | ICD-10-CM | POA: Diagnosis not present

## 2021-07-09 DIAGNOSIS — G2581 Restless legs syndrome: Secondary | ICD-10-CM | POA: Diagnosis not present

## 2021-07-09 DIAGNOSIS — J984 Other disorders of lung: Secondary | ICD-10-CM | POA: Diagnosis not present

## 2021-07-09 DIAGNOSIS — R0600 Dyspnea, unspecified: Secondary | ICD-10-CM | POA: Diagnosis not present

## 2021-07-09 DIAGNOSIS — J479 Bronchiectasis, uncomplicated: Secondary | ICD-10-CM | POA: Diagnosis not present

## 2021-07-13 DIAGNOSIS — R0602 Shortness of breath: Secondary | ICD-10-CM | POA: Diagnosis not present

## 2021-07-13 DIAGNOSIS — J984 Other disorders of lung: Secondary | ICD-10-CM | POA: Diagnosis not present

## 2021-07-13 DIAGNOSIS — J479 Bronchiectasis, uncomplicated: Secondary | ICD-10-CM | POA: Diagnosis not present

## 2021-07-15 DIAGNOSIS — I700 Atherosclerosis of aorta: Secondary | ICD-10-CM | POA: Diagnosis not present

## 2021-07-15 DIAGNOSIS — J479 Bronchiectasis, uncomplicated: Secondary | ICD-10-CM | POA: Diagnosis not present

## 2021-07-15 DIAGNOSIS — J984 Other disorders of lung: Secondary | ICD-10-CM | POA: Diagnosis not present

## 2021-07-21 DIAGNOSIS — G4730 Sleep apnea, unspecified: Secondary | ICD-10-CM | POA: Diagnosis not present

## 2021-07-21 DIAGNOSIS — R0683 Snoring: Secondary | ICD-10-CM | POA: Diagnosis not present

## 2021-07-29 DIAGNOSIS — I480 Paroxysmal atrial fibrillation: Secondary | ICD-10-CM | POA: Diagnosis not present

## 2021-07-29 DIAGNOSIS — Z7901 Long term (current) use of anticoagulants: Secondary | ICD-10-CM | POA: Diagnosis not present

## 2021-07-29 DIAGNOSIS — D539 Nutritional anemia, unspecified: Secondary | ICD-10-CM | POA: Diagnosis not present

## 2021-07-29 DIAGNOSIS — I4819 Other persistent atrial fibrillation: Secondary | ICD-10-CM | POA: Diagnosis not present

## 2021-08-17 ENCOUNTER — Inpatient Hospital Stay (HOSPITAL_COMMUNITY)
Admission: EM | Admit: 2021-08-17 | Discharge: 2021-08-21 | DRG: 309 | Disposition: A | Discharge status: Home / Self Care | Payer: PPO | Attending: Internal Medicine | Admitting: Internal Medicine

## 2021-08-17 ENCOUNTER — Emergency Department (HOSPITAL_COMMUNITY): Payer: PPO

## 2021-08-17 ENCOUNTER — Encounter (HOSPITAL_COMMUNITY): Payer: Self-pay | Admitting: Emergency Medicine

## 2021-08-17 DIAGNOSIS — J984 Other disorders of lung: Secondary | ICD-10-CM | POA: Diagnosis not present

## 2021-08-17 DIAGNOSIS — I2510 Atherosclerotic heart disease of native coronary artery without angina pectoris: Secondary | ICD-10-CM | POA: Diagnosis not present

## 2021-08-17 DIAGNOSIS — D638 Anemia in other chronic diseases classified elsewhere: Secondary | ICD-10-CM | POA: Diagnosis not present

## 2021-08-17 DIAGNOSIS — R000 Tachycardia, unspecified: Secondary | ICD-10-CM | POA: Diagnosis not present

## 2021-08-17 DIAGNOSIS — Z87891 Personal history of nicotine dependence: Secondary | ICD-10-CM

## 2021-08-17 DIAGNOSIS — J479 Bronchiectasis, uncomplicated: Secondary | ICD-10-CM | POA: Diagnosis not present

## 2021-08-17 DIAGNOSIS — Z882 Allergy status to sulfonamides status: Secondary | ICD-10-CM | POA: Diagnosis not present

## 2021-08-17 DIAGNOSIS — I129 Hypertensive chronic kidney disease with stage 1 through stage 4 chronic kidney disease, or unspecified chronic kidney disease: Secondary | ICD-10-CM | POA: Diagnosis not present

## 2021-08-17 DIAGNOSIS — Z7901 Long term (current) use of anticoagulants: Secondary | ICD-10-CM | POA: Diagnosis not present

## 2021-08-17 DIAGNOSIS — I959 Hypotension, unspecified: Secondary | ICD-10-CM | POA: Diagnosis present

## 2021-08-17 DIAGNOSIS — Z8546 Personal history of malignant neoplasm of prostate: Secondary | ICD-10-CM

## 2021-08-17 DIAGNOSIS — I4510 Unspecified right bundle-branch block: Secondary | ICD-10-CM | POA: Diagnosis not present

## 2021-08-17 DIAGNOSIS — R0602 Shortness of breath: Secondary | ICD-10-CM | POA: Diagnosis not present

## 2021-08-17 DIAGNOSIS — Z888 Allergy status to other drugs, medicaments and biological substances status: Secondary | ICD-10-CM | POA: Diagnosis not present

## 2021-08-17 DIAGNOSIS — Z885 Allergy status to narcotic agent status: Secondary | ICD-10-CM

## 2021-08-17 DIAGNOSIS — R110 Nausea: Secondary | ICD-10-CM | POA: Diagnosis not present

## 2021-08-17 DIAGNOSIS — Z981 Arthrodesis status: Secondary | ICD-10-CM

## 2021-08-17 DIAGNOSIS — I951 Orthostatic hypotension: Secondary | ICD-10-CM | POA: Diagnosis present

## 2021-08-17 DIAGNOSIS — Z20822 Contact with and (suspected) exposure to covid-19: Secondary | ICD-10-CM | POA: Diagnosis present

## 2021-08-17 DIAGNOSIS — I4891 Unspecified atrial fibrillation: Secondary | ICD-10-CM | POA: Diagnosis not present

## 2021-08-17 DIAGNOSIS — I483 Typical atrial flutter: Secondary | ICD-10-CM

## 2021-08-17 DIAGNOSIS — N183 Chronic kidney disease, stage 3 unspecified: Secondary | ICD-10-CM | POA: Diagnosis not present

## 2021-08-17 DIAGNOSIS — R079 Chest pain, unspecified: Secondary | ICD-10-CM | POA: Diagnosis not present

## 2021-08-17 DIAGNOSIS — J9811 Atelectasis: Secondary | ICD-10-CM | POA: Diagnosis present

## 2021-08-17 DIAGNOSIS — F419 Anxiety disorder, unspecified: Secondary | ICD-10-CM | POA: Diagnosis not present

## 2021-08-17 DIAGNOSIS — I4819 Other persistent atrial fibrillation: Principal | ICD-10-CM | POA: Diagnosis present

## 2021-08-17 DIAGNOSIS — Z79899 Other long term (current) drug therapy: Secondary | ICD-10-CM

## 2021-08-17 LAB — BASIC METABOLIC PANEL
Anion gap: 6 (ref 5–15)
BUN: 19 mg/dL (ref 8–23)
CO2: 25 mmol/L (ref 22–32)
Calcium: 9.2 mg/dL (ref 8.9–10.3)
Chloride: 108 mmol/L (ref 98–111)
Creatinine, Ser: 1.37 mg/dL — ABNORMAL HIGH (ref 0.61–1.24)
GFR, Estimated: 51 mL/min — ABNORMAL LOW (ref >60)
Glucose, Bld: 121 mg/dL — ABNORMAL HIGH (ref 70–99)
Potassium: 4.8 mmol/L (ref 3.5–5.1)
Sodium: 139 mmol/L (ref 135–145)

## 2021-08-17 LAB — CBC WITH DIFFERENTIAL/PLATELET
Abs Immature Granulocytes: 0.01 10*3/uL (ref 0.00–0.07)
Basophils Absolute: 0 10*3/uL (ref 0.0–0.1)
Basophils Relative: 1 %
Eosinophils Absolute: 0.1 10*3/uL (ref 0.0–0.5)
Eosinophils Relative: 2 %
HCT: 34.1 % — ABNORMAL LOW (ref 39.0–52.0)
Hemoglobin: 11.3 g/dL — ABNORMAL LOW (ref 13.0–17.0)
Immature Granulocytes: 0 %
Lymphocytes Relative: 28 %
Lymphs Abs: 1.4 10*3/uL (ref 0.7–4.0)
MCH: 33.5 pg (ref 26.0–34.0)
MCHC: 33.1 g/dL (ref 30.0–36.0)
MCV: 101.2 fL — ABNORMAL HIGH (ref 80.0–100.0)
Monocytes Absolute: 0.6 10*3/uL (ref 0.1–1.0)
Monocytes Relative: 13 %
Neutro Abs: 2.8 10*3/uL (ref 1.7–7.7)
Neutrophils Relative %: 56 %
Platelets: 277 10*3/uL (ref 150–400)
RBC: 3.37 MIL/uL — ABNORMAL LOW (ref 4.22–5.81)
RDW: 22.7 % — ABNORMAL HIGH (ref 11.5–15.5)
WBC: 4.9 10*3/uL (ref 4.0–10.5)
nRBC: 0 % (ref 0.0–0.2)

## 2021-08-17 LAB — TROPONIN I (HIGH SENSITIVITY)
Troponin I (High Sensitivity): 7 ng/L (ref <18)
Troponin I (High Sensitivity): 6 ng/L (ref <18)

## 2021-08-17 LAB — RESP PANEL BY RT-PCR (FLU A&B, COVID) ARPGX2
Influenza A by PCR: NEGATIVE
Influenza B by PCR: NEGATIVE
SARS Coronavirus 2 by RT PCR: NEGATIVE

## 2021-08-17 LAB — CBG MONITORING, ED: Glucose-Capillary: 88 mg/dL (ref 70–99)

## 2021-08-17 LAB — MAGNESIUM: Magnesium: 2 mg/dL (ref 1.7–2.4)

## 2021-08-17 LAB — TSH: TSH: 1.677 u[IU]/mL (ref 0.350–4.500)

## 2021-08-17 MED ORDER — DILTIAZEM HCL 25 MG/5ML IV SOLN: 5.0000 mg | Freq: Four times a day (QID) | INTRAVENOUS | Status: DC | PRN

## 2021-08-17 MED ORDER — METOPROLOL TARTRATE 5 MG/5ML IV SOLN
5.0000 mg | Freq: Once | INTRAVENOUS | Status: AC
  Administered 2021-08-17: 5 mg via INTRAVENOUS
  Filled 2021-08-17: qty 5

## 2021-08-17 MED ORDER — NITROGLYCERIN 0.4 MG SL SUBL: 0.4000 mg | SUBLINGUAL_TABLET | SUBLINGUAL | Status: DC | PRN

## 2021-08-17 MED ORDER — DIGOXIN 125 MCG PO TABS
0.1250 mg | ORAL_TABLET | Freq: Every day | ORAL | Status: DC
  Administered 2021-08-18: 0.125 mg via ORAL
  Filled 2021-08-17: qty 1

## 2021-08-17 MED ORDER — SODIUM CHLORIDE 0.9 % IV BOLUS
500.0000 mL | Freq: Once | INTRAVENOUS | Status: AC
  Administered 2021-08-17: 500 mL via INTRAVENOUS

## 2021-08-17 MED ORDER — DIGOXIN 0.25 MG/ML IJ SOLN
0.2500 mg | Freq: Once | INTRAMUSCULAR | Status: AC
  Administered 2021-08-17: 0.25 mg via INTRAVENOUS
  Filled 2021-08-17: qty 2

## 2021-08-17 MED ORDER — APIXABAN 5 MG PO TABS
5.0000 mg | ORAL_TABLET | Freq: Two times a day (BID) | ORAL | Status: DC
  Administered 2021-08-17 – 2021-08-21 (×8): 5 mg via ORAL
  Filled 2021-08-17 (×8): qty 1

## 2021-08-17 MED ORDER — DILTIAZEM LOAD VIA INFUSION
10.0000 mg | Freq: Once | INTRAVENOUS | Status: AC
  Administered 2021-08-17: 10 mg via INTRAVENOUS
  Filled 2021-08-17: qty 10

## 2021-08-17 MED ORDER — DILTIAZEM HCL-DEXTROSE 125-5 MG/125ML-% IV SOLN (PREMIX)
5.0000 mg/h | INTRAVENOUS | Status: DC
  Administered 2021-08-17: 5 mg/h via INTRAVENOUS
  Filled 2021-08-17: qty 125

## 2021-08-17 MED ORDER — DEXTROSE 50 % IV SOLN: 25.0000 mL | Freq: Once | INTRAVENOUS | Status: AC

## 2021-08-17 MED ORDER — DEXTROSE 50 % IV SOLN
INTRAVENOUS | Status: AC
  Administered 2021-08-17: 25 mL via INTRAVENOUS
  Filled 2021-08-17: qty 50

## 2021-08-17 MED ORDER — ACETAMINOPHEN 325 MG PO TABS: 650.0000 mg | ORAL_TABLET | ORAL | Status: DC | PRN

## 2021-08-17 NOTE — Consult Note (Addendum)
Cardiology Consultation:   Patient ID: Todd Salazar MRN: [id number]; DOB: July 25, 1936  Admit date: 08/17/2021 Date of Consult: 08/17/2021  PCP:  [name], DO   [place] Providers Cardiologist:  None   {  Patient Profile:   Todd Salazar is a 85 y.o. 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 Eulis Foster.  History of Present Illness:   Todd Salazar is a 85 year old male with the above history who is followed by Dr. Minna Merritts at [facility]. Patient has  history of atrial fibrillation/flutter with difficult to control rates. Unable to tolerate rate control medications due to orthostatic hypotension. Previously on Midodrine but no longer on this.. He underwent pulmonary vein isolation by cryoablation in 2015 and states he did well for a few years after this.. Last ischemic evaluation was a Myoview in 08/2019 showed was low risk with no evidence of ischemia or infarction. Last Echo in 09/2020 showed LVEF of 60-65% with normal wall motion. More recently he has had on and off atrial flutter and atrial tachycardia and now persistent atrial fibrillation. He was previously on Flecainide but was unable to tolerate this and was changed to Propafenone. Underwent DCCV on 06/28/2021 which was initially successful; however, at follow-up with DR. Akbary on 07/09/2021 he was back in atrial fibrillation with RVR. Propafenone was increased to '300mg'$  in the morning, '150mg'$  in the afternoon, and '150mg'$  in the evening. He was prescribed Cardizem '30mg'$  three times a daily and set up for AV nodal ablation which is scheduled for 08/23/2021. Patient also has restrictive lung disease and bronchiectasis who is  followed by Pulmonology.  Patient presented to the ED today for persistent tachycardia. Patient states he has tachypalpitations for the last few days that last all day. He has some associated mild chest tightness with this as well as some shortness of breath. He also reports just feeling very fatigued. He denies any chest tightness/pain outside of episodes of tachycardia. He has chronic shortness of breath due to his restrictive lung disease that is worse when he is in rapid atrial fibrillation. No orthopnea, PND, edema. He notes some lightheadedness/dizziness with palpitations but no syncope. No recent fevers or illnesses. No cough or nasal congestion. No GI symptoms. No abnormal bleeding in urine or stools. He has not missed any doses of his Eliquis. Patient states he is not taking Cardizem and is only taking Propafenone.  In the ED, patient tachycardic but vitals stable. EKG showed atrial fibrillation, rate 150 bpm, with incomplete RBBB, with non-specific ST/T changes. Initial high-sensitivity troponin negative. Chest x-ray showed mild left basilar atelectasis. WBC 4.9, Hgb 11.3, Plts 277. Na 139, K 4.8, Glucose 121, BUN 19, Cr 1.37. He was started on IV Cardizem and admitted. Cardiology consulted for further evaluation.  Past Medical History:  Diagnosis Date   Arthritis    Atrial fibrillation ([place])    Cancer ([place])    prostate   Dysrhythmia    afib   History of kidney stones     Past Surgical History:  Procedure Laterality Date   Bone spur     CHOLECYSTECTOMY     HERNIA REPAIR     JOINT REPLACEMENT     kidney stones     LAMINECTOMY WITH POSTERIOR  LATERAL ARTHRODESIS LEVEL 1 Right 11/25/2019   Procedure: Laminectomy and Foraminotomy  Lumbar Four-Lumbar Five - right, with instrumented fusion;  Surgeon: Eustace Moore, MD;  Location: [facility];  Service: Neurosurgery;  Laterality: Right;  Laminectomy and Foraminotomy  Lumbar Four-Lumbar Five - right, with instrumented fusion   PROSTATE SURGERY        Home Medications:  Prior to Admission medications   Medication Sig Start Date End Date Taking? Authorizing Provider  apixaban (ELIQUIS) 5 MG TABS tablet Take 5 mg by mouth 2 (two) times daily.   Yes [provider]  nitroGLYCERIN (NITROSTAT) 0.4 MG SL tablet Place 0.4 mg under the tongue every 5 (five) minutes as needed for chest pain.  07/25/19  Yes [provider]  propafenone (RYTHMOL) 150 MG tablet Take 150-300 mg by mouth as directed. Take 2 tablets (300 mg) in the morning, Take 1 tablet (150 mg) in the afternoon, Take 1 tablet (150 mg) at bedtime 07/09/21  Yes [provider]  oxyCODONE (OXY IR/ROXICODONE) 5 MG immediate release tablet Take 1 tablet (5 mg total) by mouth every 4 (four) hours as needed for moderate pain ((score 4 to 6)). Patient not taking: Reported on 08/17/2021 11/26/19   Eustace Moore, MD  tiZANidine (ZANAFLEX) 2 MG tablet Take 1 tablet (2 mg total) by mouth every 8 (eight) hours as needed for muscle spasms. Patient not taking: Reported on 08/17/2021 11/26/19   Eustace Moore, MD    Inpatient Medications: Scheduled Meds:  Continuous Infusions:  diltiazem (CARDIZEM) infusion Stopped (08/17/21 1450)   PRN Meds:   Allergies:    Allergies  Allergen Reactions   Tramadol Swelling    Laryngeal edema   Duloxetine Other (See Comments)    delusions   Gabapentin Other (See Comments)    Affects eyesight   Hydrocodone-Acetaminophen Nausea And Vomiting   Prednisone Nausea And Vomiting and Other (See Comments)    confusion   Nisoldipine Other (See Comments)   Ciprofloxacin Other (See Comments)    Dizziness    Methocarbamol Nausea Only   Ondansetron Hcl Nausea Only   Sulfa Antibiotics Rash    Social History:   Social History   Socioeconomic History   Marital status: Unknown    Spouse name: Not on file   Number of children: Not on file   Years of education: Not on file   Highest education level: Not on file  Occupational History    Not on file  Tobacco Use   Smoking status: Former    Types: Cigarettes    Quit date: 12/10/1996    Years since quitting: 24.7   Smokeless tobacco: Never  Vaping Use   Vaping Use: Never used  Substance and Sexual Activity   Alcohol use: No   Drug use: No   Sexual activity: Never  Other Topics Concern   Not on file  Social History Narrative   Not on file   Social Determinants of Health   Financial Resource Strain: Not on file  Food Insecurity: Not on file  Transportation Needs: Not on file  Physical Activity: Not on file  Stress: Not on file  Social Connections: Not on file  Intimate Partner Violence: Not on file    Family History:   Family History  Problem Relation Age of Onset   CAD Neg Hx    Heart failure Neg Hx    Stroke Neg Hx      ROS:  Please see the history of present illness.  All other ROS reviewed and negative.     Physical Exam/Data:   Vitals:   08/17/21 1600 08/17/21 1615 08/17/21 1630 08/17/21 1645  BP: 107/81 116/85 118/88 (!) 119/92  Pulse: (!) 128 (!) 128 (!) 128 (!) 126  Resp: '17 16 14 14  '$ Temp:      SpO2: 99% 100% 100% 100%    Intake/Output Summary (Last 24 hours) at 08/17/2021 1722 Last data filed at 08/17/2021 1545 Gross per 24 hour  Intake 1010.64 ml  Output --  Net 1010.64 ml   Last 3 Weights 11/25/2019 11/21/2019 08/09/2017  Weight (lbs) 180 lb 185 lb 187 lb  Weight (kg) 81.647 kg 83.915 kg 84.823 kg     There is no height or weight on file to calculate BMI.  General: 85 y.o. Caucasian male resting comfortably in no acute distress. HEENT: Normocephalic and atraumatic. Sclera clear.  Neck: Supple. No JVD. Heart: Tachycardic with irregularly irregular rhythm. Distinct S1 and S2. No murmurs, gallops, or rubs. Radial pulses 2+ and equal bilaterally. Lungs: No increased work of breathing. Clear to ausculation bilaterally. No wheezes, rhonchi, or rales.  Abdomen: Soft, non-distended, and non-tender to palpation. Bowel sounds  present. MSK: Normal strength and tone for age. Extremities: No lower extremity edema.    Skin: Warm and dry. Neuro: Alert and oriented x3. No focal deficits. Psych: Normal affect. Responds appropriately.  EKG:  The EKG was personally reviewed and demonstrates:  - EKG from 08/17/2021 at 11:42am: Atrial fibrillation, rate 150 bpm, with incomplete RBBB, with non-specific ST/T changes - EKG from 08/17/2021 at 14:26pm: Atrial fibrillation, rate 135 bpm, with incomplete RBBB with T wave inversions in leads V2-V3. - EKG from 08/17/2021 at 14:45pm: Atrial fibrillation, rate 112 bpm, with no acute ST/T changes.  Telemetry:  Telemetry was personally reviewed and demonstrates:  Atrial fibrillation/flutter with rates in the 130s to 150s. Some PVCs (occasionally in bigeminy). Also short runs of an idiopathic ventricular rhythm (longest run about 6 beats).  Relevant CV Studies:  Myoview 09/04/2019: Impressions: 1. No reversible ischemia or infarction.  2. Normal left ventricular wall motion.  3. Left ventricular ejection fraction 70%  4. Non invasive risk stratification*: Low  _______________  Echocardiogram 09/22/2020 [facility]): Impressions: Left ventricle: The left ventricular size is normal. There is normal left ventricular wall thickness. LV ejection fraction = 60-65%. Left ventricular systolic function is normal. The left ventricular wall motion is normal.   Effusion: There is no pericardial effusion. There is no pleural effusion. No cardiac tamponade.   Laboratory Data:  High Sensitivity Troponin:   Recent Labs  Lab 08/17/21 1147 08/17/21 1350  TROPONINIHS 7 6     Chemistry Recent Labs  Lab 08/17/21 1147  NA 139  K 4.8  CL 108  CO2 25  GLUCOSE 121*  BUN 19  CREATININE 1.37*  CALCIUM 9.2  GFRNONAA 51*  ANIONGAP 6    No results for input(s): PROT, ALBUMIN, AST, ALT, ALKPHOS, BILITOT in the last 168 hours. Hematology Recent Labs  Lab 08/17/21 1147  WBC 4.9  RBC 3.37*   HGB 11.3*  HCT 34.1*  MCV 101.2*  MCH 33.5  MCHC 33.1  RDW 22.7*  PLT 277   BNPNo results for input(s): BNP, PROBNP in the last 168 hours.  DDimer No results for input(s): DDIMER in the last 168 hours.   Radiology/Studies:  DG Chest Port 1 View  Result Date: 08/17/2021 CLINICAL DATA:  Chest pain, shortness of breath EXAM: PORTABLE CHEST 1 VIEW COMPARISON:  06/05/2021 FINDINGS: Stable positioning of a left-sided implanted cardiac device. Cardiomediastinal contours are within normal limits. Mild left basilar atelectasis. Lungs are otherwise clear. No pleural effusion or pneumothorax. IMPRESSION: Mild left basilar atelectasis. Electronically Signed   By: Davina Poke D.O.   On: 08/17/2021 12:43     Assessment and Plan:   Persistent Atrial Fibrillation/Flutter Tachybrady Syndrome s/p St Jude PPM - Patient has known persistent atrial fibrillation as well as a history of atrial flutter with difficult to control rates due to orthostatic hypotension. S/p ablation in 2015 and more recent DCCV in 06/2021 but converted back to atrial fibrillation 3 days later. Previously on Flecainide but unable to tolerate it. Now on Propafenone. Unable to use Amiodarone due to lung disease. Plan was for AV nodal ablation at [facility] on 08/23/2021. Presented in atrial fibrillation with rates as high as the 150s with associated chest tightness, shortness of breath, lightheadedness, and dizziness. - High-sensitivity troponin negative x2. - Potassium 4.8.  - Magnesium 2.0.  - Will check TSH. - Will check Echo. - Started on IV Cardizem but had an episode of lightheadedness/dizziness.  - Will stop home Propafenone since it is not working. - Will start Digoxin - will give IV Digoxin 0.'25mg'$  tonight and then start PO Digoxin 0.'125mg'$  tomorrow. - Continue chronic anticoagulation with Eliquis '5mg'$  twice daily.  - Will have EP see tomorrow to discuss repeat atrial fibrillation ablation, vs Tikosyn load  followed by DCCV, vs AV nodal ablation.   Orthostatic Hypotension - History of orthostatic hypotension. Previously on Midodrine but no longer. - BP currently stable but will need to watch closely on IV Cardizem.  Restrictive Lung Disease - Followed by Pulmonology as outpatient. - Management per primary team.   Risk Assessment/Risk Scores:    CHA2DS2-VASc Score = 2  This indicates a 2.2% annual risk of stroke. The patient's score is based upon: CHF History: No HTN History: No Diabetes History: No Stroke History: No Vascular Disease History: No Age Score: 2 Gender Score: 0   For questions or updates, please contact [place] Please consult [email or web address] for contact info under    Signed, Todd Mclean, PA-C  08/17/2021 5:22 PM   Personally seen and examined. Agree with APP above with the following comments: Briefly 85 yo M with a history of Persistent atrial fibrillation on Eliquis whit prior AF ablation in 2015  who presents with AF RVR and desire to transition his electrophysiology team.  Patient and family note that he did well post his PVI in 2015 but this did not last longer than a couple of years. - notes issues with beta blockers related to his orthostatic hypotension; family unclear if this also involves sotalol but daughter notes that all beta blockers have given them trouble - notes intolerance to flecainide (unclear) - with iv Cardizem had transient hypotension and this was stopped - has not tried digoxin or tikoysn before; was planned for AVJ ablation. - has not tried amiodarone because of lung disease Exam notable for irregular tachycardia; decreased breath sounds bilaterally, no edema; weaned to 1 L O2 during exam. Labs notable for troponin 6 Personally reviewed relevant tests; EKG consistent with AF rate 150 with RBBB; tele shows improved rate control Would recommend  - short term will tolerate heart rates 120-130s - we are starting digoxin to help  rate control; can give short IV doses of diltiazem for symptomatic tachycardia rates 130-140s - unclear etiology of restrictive lung disease; if amiodarone is to  be considered would get HRCT non con prior;he is not on O2 at baseline - options include potentially Tikosyn (added to DCCV schedule for Friday PM); AV nodal ablation (has St. DC PPM) or repeat AF ablation.  EP will see in AM 08/18/21 - will get echo - discussed with patient an all of his family members (three sons and daugther)  Rudean Haskell, MD [place]  [address] [place], [place] [address] [phone]  6:05 PM

## 2021-08-17 NOTE — ED Provider Notes (Signed)
[facility] EMERGENCY DEPARTMENT Provider Note   CSN: [id number] Arrival date & time: 08/17/21  1137     History No chief complaint on file.   Todd Salazar is a 85 y.o. male.  HPI He is here for evaluation of persistent tachycardia, for 2 weeks.  He had a cardioversion for atrial fibrillation about a month ago, which was successful, but failed after 3 days.  He was then offered cardiac ablation, which has been set up but not done yet.  He is due to have preablation testing done in 3 days.  He does not want to continue with that cardiologist, at this time.  He denies recent fever, chills, cough, shortness of breath, focal weakness or paresthesia.  He states he is taking his usual medications.  There are no other known active modifying factors.    Past Medical History:  Diagnosis Date   Arthritis    Atrial fibrillation ([place])    Cancer [facility])    prostate   Dysrhythmia    afib   History of kidney stones     Patient Active Problem List   Diagnosis Date Noted   S/P lumbar fusion 11/25/2019   Carcinoma of prostate ([place]) 09/07/2019   Lumbar radiculopathy 09/07/2019   Degeneration of lumbar intervertebral disc 09/07/2019   History of total replacement of both hip joints 08/01/2019   Iliotibial band syndrome of right side 07/27/2019   Macrocytic anemia 07/26/2019   Hypotension 07/26/2019   Bronchiectasis without complication ([place]) [id number]   DOE (dyspnea on exertion) 10/19/2018   Lung nodule < 6cm on CT 10/09/2018   Occipital neuritis 08/29/2016   Bilateral hearing loss 06/22/2016   Post herpetic neuralgia 02/02/2016   Current use of long term anticoagulation 12/18/2015   Calcification of spleen 09/13/2014   Diverticulosis 09/13/2014   Bilateral renal cysts 09/13/2014   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   Hemangioma of liver 09/13/2014   Male erectile dysfunction, unspecified 09/13/2014   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   Paraphimosis 09/13/2014    Polyp of colon 09/13/2014   Restless legs syndrome 09/13/2014   Spondylosis without myelopathy or radiculopathy, cervical region 09/13/2014   Urethral stricture 09/13/2014   Spondylosis without myelopathy or radiculopathy, lumbosacral region 09/13/2014   Anxiety 09/13/2014   Chest pain at rest 12/11/2011    Class: Acute   CAD (coronary artery disease), native coronary artery 12/11/2011    Class: Chronic   HTN (hypertension) 12/11/2011   Paroxysmal atrial fibrillation ([place]) 12/11/2011    Past Surgical History:  Procedure Laterality Date   Bone spur     CHOLECYSTECTOMY     HERNIA REPAIR     JOINT REPLACEMENT     kidney stones     LAMINECTOMY WITH POSTERIOR LATERAL ARTHRODESIS LEVEL 1 Right 11/25/2019   Procedure: Laminectomy and Foraminotomy  Lumbar Four-Lumbar Five - right, with instrumented fusion;  Surgeon: Eustace Moore, MD;  Location: [facility];  Service: Neurosurgery;  Laterality: Right;  Laminectomy and Foraminotomy  Lumbar Four-Lumbar Five - right, with instrumented fusion   PROSTATE SURGERY         No family history on file.  Social History   Tobacco Use   Smoking status: Former    Types: Cigarettes    Quit date: 12/10/1996    Years since quitting: 24.7   Smokeless tobacco: Never  Vaping Use   Vaping Use: Never used  Substance Use Topics   Alcohol use: No   Drug use: No  Home Medications Prior to Admission medications   Medication Sig Start Date End Date Taking? Authorizing Provider  apixaban (ELIQUIS) 5 MG TABS tablet Take 5 mg by mouth 2 (two) times daily.   Yes [provider]  nitroGLYCERIN (NITROSTAT) 0.4 MG SL tablet Place 0.4 mg under the tongue every 5 (five) minutes as needed for chest pain.  07/25/19  Yes [provider]  propafenone (RYTHMOL) 150 MG tablet Take 150-300 mg by mouth as directed. Take 2 tablets (300 mg) in the morning, Take 1 tablet (150 mg) in the afternoon, Take 1 tablet (150 mg) at bedtime 07/09/21  Yes [provider]  oxyCODONE (OXY IR/ROXICODONE) 5 MG immediate release tablet Take 1 tablet (5 mg total) by mouth every 4 (four) hours as needed for moderate pain ((score 4 to 6)). Patient not taking: Reported on 08/17/2021 11/26/19   Eustace Moore, MD  tiZANidine (ZANAFLEX) 2 MG tablet Take 1 tablet (2 mg total) by mouth every 8 (eight) hours as needed for muscle spasms. Patient not taking: Reported on 08/17/2021 11/26/19   Eustace Moore, MD    Allergies    Tramadol, Duloxetine, Gabapentin, Hydrocodone-acetaminophen, Prednisone, Nisoldipine, Ciprofloxacin, Methocarbamol, Ondansetron hcl, and Sulfa antibiotics  Review of Systems   Review of Systems  All other systems reviewed and are negative.  Physical Exam Updated Vital Signs BP 116/85   Pulse (!) 128   Temp 97.9 F (36.6 C)   Resp 16   SpO2 100%   Physical Exam Vitals and nursing note reviewed.  Constitutional:      Appearance: He is well-developed. He is not ill-appearing.  HENT:     Head: Normocephalic and atraumatic.     Right Ear: External ear normal.     Left Ear: External ear normal.  Eyes:     Conjunctiva/sclera: Conjunctivae normal.     Pupils: Pupils are equal, round, and reactive to light.  Neck:     Trachea: Phonation normal.  Cardiovascular:     Rate and Rhythm: Regular rhythm. Tachycardia present.     Heart sounds: Normal heart sounds. No murmur heard. Pulmonary:     Effort: Pulmonary effort is normal.     Breath sounds: Normal breath sounds.  Abdominal:     General: There is no distension.     Palpations: Abdomen is soft.     Tenderness: There is no abdominal tenderness.  Musculoskeletal:        General: Normal range of motion.     Cervical back: Normal range of motion and neck supple.     Right lower leg: No edema.     Left lower leg: No edema.  Skin:    General: Skin is warm and dry.  Neurological:     Mental Status: He is alert and oriented to person, place, and time.     Cranial Nerves: No  cranial nerve deficit.     Sensory: No sensory deficit.     Motor: No abnormal muscle tone.     Coordination: Coordination normal.  Psychiatric:        Mood and Affect: Mood normal.        Behavior: Behavior normal.        Thought Content: Thought content normal.        Judgment: Judgment normal.    ED Results / Procedures / Treatments   Labs (all labs ordered are listed, but only abnormal results are displayed) Labs Reviewed  BASIC METABOLIC PANEL - Abnormal; Notable for the  following components:      Result Value   Glucose, Bld 121 (*)    Creatinine, Ser 1.37 (*)    GFR, Estimated 51 (*)    All other components within normal limits  CBC WITH DIFFERENTIAL/PLATELET - Abnormal; Notable for the following components:   RBC 3.37 (*)    Hemoglobin 11.3 (*)    HCT 34.1 (*)    MCV 101.2 (*)    RDW 22.7 (*)    All other components within normal limits  RESP PANEL BY RT-PCR (FLU A&B, COVID) ARPGX2  MAGNESIUM  CBG MONITORING, ED  TROPONIN I (HIGH SENSITIVITY)  TROPONIN I (HIGH SENSITIVITY)    EKG EKG Interpretation  Date/Time:  Tuesday August 17 2021 11:42:30 EDT Ventricular Rate:  150 PR Interval:    QRS Duration: 98 QT Interval:  298 QTC Calculation: 470 R Axis:   -4 Text Interpretation: Atrial flutter with 2:1 A-V conduction Incomplete right bundle branch block Nonspecific ST and T wave abnormality Abnormal ECG Since last tracing rate faster and now in Atrial fibrillation with rapid ventricular response Confirmed by Daleen Bo [phone]) on 08/17/2021 3:33:09 PM   EKG Interpretation  Date/Time:  Tuesday August 17 2021 14:26:28 EDT Ventricular Rate:  135 PR Interval:  88 QRS Duration: 137 QT Interval:  335 QTC Calculation: 503 R Axis:   0 Text Interpretation: Atrial flutter Right bundle branch block Since last tracing of earlier today Rate slower Confirmed by Daleen Bo [phone]) on 08/17/2021 3:35:18 PM           Date: 08/17/21  Rate: 147  Rhythm: atrial  flutter  QRS Axis: normal  PR and QT Intervals:  normal QT  ST/T Wave abnormalities: nonspecific ST/T changes  PR and QRS Conduction Disutrbances:nonspecific intraventricular conduction delay  Narrative Interpretation:   Old EKG Reviewed: changes noted   Radiology DG Chest Port 1 View  Result Date: 08/17/2021 CLINICAL DATA:  Chest pain, shortness of breath EXAM: PORTABLE CHEST 1 VIEW COMPARISON:  06/05/2021 FINDINGS: Stable positioning of a left-sided implanted cardiac device. Cardiomediastinal contours are within normal limits. Mild left basilar atelectasis. Lungs are otherwise clear. No pleural effusion or pneumothorax. IMPRESSION: Mild left basilar atelectasis. Electronically Signed   By: Davina Poke D.O.   On: 08/17/2021 12:43    Procedures .Critical Care  Date/Time: 08/17/2021 4:33 PM Performed by: Daleen Bo, MD Authorized by: Daleen Bo, MD   Critical care provider statement:    Critical care time (minutes):  66   Critical care start time:  08/17/2021 2:01 PM   Critical care end time:  08/17/2021 4:33 PM   Critical care time was exclusive of:  Separately billable procedures and treating other patients   Critical care was necessary to treat or prevent imminent or life-threatening deterioration of the following conditions:  Cardiac failure   Critical care was time spent personally by me on the following activities:  Blood draw for specimens, development of treatment plan with patient or surrogate, discussions with consultants, evaluation of patient's response to treatment, examination of patient, obtaining history from patient or surrogate, ordering and performing treatments and interventions, ordering and review of laboratory studies, pulse oximetry, re-evaluation of patient's condition, review of old charts and ordering and review of radiographic studies   Medications Ordered in ED Medications  diltiazem (CARDIZEM) 1 mg/mL load via infusion 10 mg (10 mg Intravenous Bolus  from Bag 08/17/21 1436)    And  diltiazem (CARDIZEM) 125 mg in dextrose 5% 125 mL (1 mg/mL) infusion (0  mg/hr Intravenous Stopped 08/17/21 1450)  metoprolol tartrate (LOPRESSOR) injection 5 mg (5 mg Intravenous Given 08/17/21 1342)  sodium chloride 0.9 % bolus 500 mL (0 mLs Intravenous Stopped 08/17/21 1428)  sodium chloride 0.9 % bolus 500 mL (0 mLs Intravenous Stopped 08/17/21 1545)  dextrose 50 % solution 25 mL (25 mLs Intravenous Not Given 08/17/21 1616)    ED Course  I have reviewed the triage vital signs and the nursing notes.  Pertinent labs & imaging results that were available during my care of the patient were reviewed by me and considered in my medical decision making (see chart for details).  Clinical Course as of 08/17/21 1633  Tue Aug 17, 2021  1323 He has a English as a second language teacher.  I asked the nurse to interrogate it. [EW]  1426 I was called to the room because patient states he "has a funny feeling all over.  He is alert and lucid.  Heart rate is 115-130, irregular.  He still appears to be in atrial flutter on the cardiac monitor.  Cardizem has been ordered.  We will give another half liter bolus of saline.  Awaiting cardiology callback. [EW]  16 Pacemaker interrogation indicates that he has been in prolonged rapid atrial fibrillation [EW]  1427 Currently there is no evidence of stroke.  No dysarthria, aphasia, facial asymmetry or weakness. [EW]  [id number] Currently, patient on Cardizem drip, rate to be lowered for blood pressure in the 90s.  I discussed case with cardiology who will evaluate the patient for flotation and possibly admission. [EW]  [id number] Pressure is now [id number] systolic, and his heart rate is 1 15-1 20 irregular, consistent with atrial fibrillation.  He states the tingling sensation is improving.  His blood sugar was 88, will give him half amp of D50.  I discussed possible need for urgent cardioversion, he is agreeable if that is necessary. [EW]    Clinical Course User Index [EW]  Daleen Bo, MD   MDM Rules/Calculators/A&P                            Patient Vitals for the past 24 hrs:  BP Temp Pulse Resp SpO2  08/17/21 1615 116/85 -- (!) 128 16 100 %  08/17/21 1600 107/81 -- (!) 128 17 99 %  08/17/21 1545 108/84 -- (!) 125 14 100 %  08/17/21 1530 114/84 -- (!) 130 12 100 %  08/17/21 1515 104/83 -- -- 12 --  08/17/21 1500 103/82 -- (!) 40 20 100 %  08/17/21 1442 112/66 -- (!) 45 16 100 %  08/17/21 1440 90/65 -- (!) 136 14 99 %  08/17/21 1435 (!) 120/92 -- (!) 134 14 100 %  08/17/21 1430 (!) 116/93 -- (!) 134 (!) 21 100 %  08/17/21 1400 108/89 -- (!) 132 18 99 %  08/17/21 1330 111/89 -- (!) 145 19 98 %  08/17/21 1305 116/85 -- (!) 145 20 95 %  08/17/21 1230 111/83 -- (!) 147 20 100 %  08/17/21 1145 124/87 97.9 F (36.6 C) (!) 147 20 100 %    4:30 PM Reevaluation with update and discussion. After initial assessment and treatment, an updated evaluation reveals heart rate remains in the 130 range.  Initial cardiology consult started, but not completed.  Patient appears comfortable. Daleen Bo   Medical Decision Making:  This patient is presenting for evaluation of tachycardia, which does require a range of treatment options, and is a complaint that  involves a high risk of morbidity and mortality. The differential diagnoses include ACS, atrial fibrillation, metabolic disorder. I decided to review old records, and in summary elderly male, anticoagulated for atrial fibrillation, presenting with persistent tachycardia for 2 weeks.  He had a recent cardiac conversion but reports that he went back into atrial fibrillation within 3 days.  He has now been symptomatic with tachycardia for 2 weeks.  Coronary artery disease, as well as multiple comorbidities including anemia, which is chronic and mild, and recently was evaluated by hematologist, without interventions planned.  I obtained additional historical information from his daughter, at the bedside.  Clinical  Laboratory Tests Ordered, included CBC, Metabolic panel, and troponin, magnesium . Review indicates normal except glucose high, creatinine high, GFR low, hemoglobin low, MCV high. Radiologic Tests Ordered, included chest x-ray.  I independently Visualized: Chest x-ray images, which show no acute CHF or infiltrate  Cardiac Monitor Tracing which shows atrial flutter, rapid   Critical Interventions-clinical evaluation, laboratory testing, radiography, medication treatment, medication medication, IV fluids, borderline low CBG treated with IV glucose.  After These Interventions, the Patient was reevaluated and was found improved, requiring cardiology consultation, persistent tachycardia, and prolonged atrial fibrillation, after pacemaker interrogation.  Suspect patient will require hospitalization additional evaluation possibly by electrophysiology.  I elected to not proceed with cardioversion in the ED, because of the complex nature of his ongoing arrhythmia.  CRITICAL CARE-yes Performed by: Daleen Bo  Nursing Notes Reviewed/ Care Coordinated Applicable Imaging Reviewed Interpretation of Laboratory Data incorporated into ED treatment  Plan-as per cardiology in conjunction with oncoming provider team.  Possible admission by cardiology versus hospitalist with cardiology consultation.    Final Clinical Impression(s) / ED Diagnoses Final diagnoses:  Typical atrial flutter ([place])  Tachycardia    Rx / DC Orders ED Discharge Orders     None        Daleen Bo, MD 08/17/21 [phone]

## 2021-08-17 NOTE — ED Notes (Signed)
Cardiology notified of pt HR ranging from 130-135, no pain reported by patient. Per PA Dunn - I think I would be inclined to await his HR response to the digoxin that he got a short while ago - Dr. Gasper Sells had mentioned would tolerate HR 120s-130s, but notify for new symptoms

## 2021-08-17 NOTE — ED Notes (Addendum)
Pt's daughter reports pt "feeling funny". This RN notified MD Eulis Foster. RN went directly to pt's bedside, EKG shot, and diltiazem infusion started.

## 2021-08-17 NOTE — ED Provider Notes (Signed)
Blood pressure 118/88, pulse (!) 128, temperature 97.9 F (36.6 C), resp. rate 14, SpO2 100 %.  Assuming care from Dr. Eulis Foster.  In short, Todd Salazar is a 85 y.o. male with a chief complaint of No chief complaint on file. Marland Kitchen  Refer to the original H&P for additional details.  The current plan of care is to follow up on Cardiology recommendations.   EKG Interpretation  Date/Time:  Tuesday August 17 2021 14:45:01 EDT Ventricular Rate:  112 PR Interval:  88 QRS Duration: 104 QT Interval:  365 QTC Calculation: 499 R Axis:   -39 Text Interpretation: Atrial flutter Paired ventricular premature complexes Left axis deviation Abnormal R-wave progression, early transition Borderline repolarization abnormality Borderline prolonged QT interval Since last tracing of earlier today Rate slower Otherwise no significant change Confirmed by Daleen Bo [phone] on 08/17/2021 3:36:52 PM        Discussed patient's case with Cardiology to request admission. Patient and family (if present) updated with plan. Care transferred to Cardiology service.  I reviewed all nursing notes, vitals, pertinent old records, EKGs, labs, imaging (as available).    Margette Fast, MD 08/17/21 2340

## 2021-08-17 NOTE — Progress Notes (Signed)
Notified for continued elevated HR 130s, no chest pain. Did not tolerate diltiazem earlier due to dizziness. Just got digoxin 2 hours prior. Per Dr. Oralia Rud note, he indicated he would tolerate HR 120s-130s, and will likely need EP involvement in AM to discuss more advanced modalities for HR control. Therefore would be inclined to await effect of digoxin before advancing regimen further. D/w Dr. Radford Pax on call who agrees.

## 2021-08-17 NOTE — ED Provider Notes (Signed)
Emergency Medicine Provider Triage Evaluation Note  Todd Salazar , a 85 y.o. male  was evaluated in triage.  Pt complains of chest pain, shortness of breath.  Review of Systems  Positive: Chest pain, palpitations, shortness of breath Negative: Fever  Physical Exam  There were no vitals taken for this visit. Gen:   Awake, no distress   Resp:  Normal effort  MSK:   Moves extremities without difficulty  Other:  Tachycardia present  Medical Decision Making  Medically screening exam initiated at 11:45 AM.  Appropriate orders placed.  Todd Salazar was informed that the remainder of the evaluation will be completed by another provider, this initial triage assessment does not replace that evaluation, and the importance of remaining in the ED until their evaluation is complete.  Patient with tachycardia chest pain shortness of breath indicative of unstable arrhythmia/ischemia. Will move expediently back to room.  Discussed with [place] of charge RN who will move patient to major care.   [name], [place] 08/17/21 1146    Daleen Bo, MD 08/17/21 [phone]

## 2021-08-17 NOTE — ED Notes (Signed)
Interrogated [employer] pacemaker.

## 2021-08-17 NOTE — ED Triage Notes (Signed)
Patient complains of feeling like his heart is racing for the last several days but states today is worse. Complains of generalized weakness and fatigue as well.

## 2021-08-17 NOTE — H&P (Signed)
Patient seen in consult earlier this afternoon. He has no other active issues going on; therefore, we will actually be admitting to our service. Of note, patient does restrictive lung disease and is followed by Pulmonology as an outpatient but this is stable and he is on no medications for this at home. He has no active wheezing and is not in any respiratory distress. Will copy consult note below which will now serve as H&P.  Todd Mclean, PA-C 08/17/2021 7:28 PM  Cardiology Consultation:    Patient ID: Todd Salazar MRN: [id number]; DOB: 1936/06/04   Admit date: 08/17/2021 Date of Consult: 08/17/2021   PCP:  [name], DO              [place] Providers Cardiologist:  None   {   Patient Profile:    Todd Salazar is a 85 y.o. 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 Eulis Foster.   History of Present Illness:    Todd Salazar is a 85 year old male with the above history who is followed by Dr. Minna Merritts at [facility]. Patient has  history of atrial fibrillation/flutter with difficult to control rates. Unable to tolerate rate control medications due to orthostatic hypotension. Previously on Midodrine but no longer on this.. He underwent pulmonary vein isolation by cryoablation in 2015 and states he did well for a few years after this.. Last ischemic evaluation was a Myoview in 08/2019 showed was low risk with no evidence of ischemia or infarction. Last Echo in 09/2020 showed LVEF of 60-65% with normal wall motion. More recently he has had on and off atrial flutter and atrial tachycardia and now persistent atrial fibrillation. He was previously on Flecainide but was unable  to tolerate this and was changed to Propafenone. Underwent DCCV on 06/28/2021 which was initially successful; however, at follow-up with DR. Akbary on 07/09/2021 he was back in atrial fibrillation with RVR. Propafenone was increased to '300mg'$  in the morning, '150mg'$  in the afternoon, and '150mg'$  in the evening. He was prescribed Cardizem '30mg'$  three times a daily and set up for AV nodal ablation which is scheduled for 08/23/2021. Patient also has restrictive lung disease and bronchiectasis who is followed by Pulmonology.   Patient presented to the ED today for persistent tachycardia. Patient states he has tachypalpitations for the last few days that last all day. He has some associated mild chest tightness with this as well as some shortness of breath. He also reports just feeling very fatigued. He denies any chest tightness/pain outside of episodes of tachycardia. He has chronic shortness of breath due to his restrictive lung disease that is worse when he is in rapid atrial fibrillation. No orthopnea, PND, edema. He notes some lightheadedness/dizziness with palpitations but no syncope. No recent fevers or illnesses. No cough or nasal congestion. No GI symptoms. No abnormal bleeding in urine or stools. He has not missed any doses of his Eliquis. Patient states he is not taking Cardizem and is only taking Propafenone.   In the ED, patient tachycardic but vitals stable. EKG showed atrial fibrillation, rate 150 bpm, with incomplete RBBB, with non-specific ST/T changes. Initial high-sensitivity troponin negative. Chest x-ray showed mild left basilar atelectasis. WBC 4.9, Hgb 11.3, Plts 277. Na  139, K 4.8, Glucose 121, BUN 19, Cr 1.37. He was started on IV Cardizem and admitted. Cardiology consulted for further evaluation.       Past Medical History:  Diagnosis Date   Arthritis     Atrial fibrillation ([place])     Cancer ([place])      prostate   Dysrhythmia      afib   History of kidney stones             Past  Surgical History:  Procedure Laterality Date   Bone spur       CHOLECYSTECTOMY       HERNIA REPAIR       JOINT REPLACEMENT       kidney stones       LAMINECTOMY WITH POSTERIOR LATERAL ARTHRODESIS LEVEL 1 Right 11/25/2019    Procedure: Laminectomy and Foraminotomy  Lumbar Four-Lumbar Five - right, with instrumented fusion;  Surgeon: Eustace Moore, MD;  Location: [facility];  Service: Neurosurgery;  Laterality: Right;  Laminectomy and Foraminotomy  Lumbar Four-Lumbar Five - right, with instrumented fusion   PROSTATE SURGERY          Home Medications:         Prior to Admission medications   Medication Sig Start Date End Date Taking? Authorizing Provider  apixaban (ELIQUIS) 5 MG TABS tablet Take 5 mg by mouth 2 (two) times daily.     Yes [provider]  nitroGLYCERIN (NITROSTAT) 0.4 MG SL tablet Place 0.4 mg under the tongue every 5 (five) minutes as needed for chest pain.  07/25/19   Yes [provider]  propafenone (RYTHMOL) 150 MG tablet Take 150-300 mg by mouth as directed. Take 2 tablets (300 mg) in the morning, Take 1 tablet (150 mg) in the afternoon, Take 1 tablet (150 mg) at bedtime 07/09/21   Yes [provider]  oxyCODONE (OXY IR/ROXICODONE) 5 MG immediate release tablet Take 1 tablet (5 mg total) by mouth every 4 (four) hours as needed for moderate pain ((score 4 to 6)). Patient not taking: Reported on 08/17/2021 11/26/19     Eustace Moore, MD  tiZANidine (ZANAFLEX) 2 MG tablet Take 1 tablet (2 mg total) by mouth every 8 (eight) hours as needed for muscle spasms. Patient not taking: Reported on 08/17/2021 11/26/19     Eustace Moore, MD      Inpatient Medications: Scheduled Meds:   Continuous Infusions:  diltiazem (CARDIZEM) infusion Stopped (08/17/21 1450)    PRN Meds:     Allergies:         Allergies  Allergen Reactions   Tramadol Swelling      Laryngeal edema   Duloxetine Other (See Comments)      delusions   Gabapentin Other (See Comments)       Affects eyesight   Hydrocodone-Acetaminophen Nausea And Vomiting   Prednisone Nausea And Vomiting and Other (See Comments)      confusion   Nisoldipine Other (See Comments)   Ciprofloxacin Other (See Comments)      Dizziness     Methocarbamol Nausea Only   Ondansetron Hcl Nausea Only   Sulfa Antibiotics Rash      Social History:   Social History         Socioeconomic History   Marital status: Unknown      Spouse name: Not on file   Number of children: Not on file   Years of education: Not on file   Highest education level: Not on  file  Occupational History   Not on file  Tobacco Use   Smoking status: Former      Types: Cigarettes      Quit date: 12/10/1996      Years since quitting: 24.7   Smokeless tobacco: Never  Vaping Use   Vaping Use: Never used  Substance and Sexual Activity   Alcohol use: No   Drug use: No   Sexual activity: Never  Other Topics Concern   Not on file  Social History Narrative   Not on file    Social Determinants of Health    Financial Resource Strain: Not on file  Food Insecurity: Not on file  Transportation Needs: Not on file  Physical Activity: Not on file  Stress: Not on file  Social Connections: Not on file  Intimate Partner Violence: Not on file    Family History:        Family History  Problem Relation Age of Onset   CAD Neg Hx     Heart failure Neg Hx     Stroke Neg Hx        ROS:  Please see the history of present illness.  All other ROS reviewed and negative.      Physical Exam/Data:          Vitals:    08/17/21 1600 08/17/21 1615 08/17/21 1630 08/17/21 1645  BP: 107/81 116/85 118/88 (!) 119/92  Pulse: (!) 128 (!) 128 (!) 128 (!) 126  Resp: '17 16 14 14  '$ Temp:          SpO2: 99% 100% 100% 100%      Intake/Output Summary (Last 24 hours) at 08/17/2021 1722 Last data filed at 08/17/2021 1545    Gross per 24 hour  Intake 1010.64 ml  Output --  Net 1010.64 ml    Last 3 Weights 11/25/2019 11/21/2019 08/09/2017   Weight (lbs) 180 lb 185 lb 187 lb  Weight (kg) 81.647 kg 83.915 kg 84.823 kg     There is no height or weight on file to calculate BMI.  General: 85 y.o. Caucasian male resting comfortably in no acute distress. HEENT: Normocephalic and atraumatic. Sclera clear.  Neck: Supple. No JVD. Heart: Tachycardic with irregularly irregular rhythm. Distinct S1 and S2. No murmurs, gallops, or rubs. Radial pulses 2+ and equal bilaterally. Lungs: No increased work of breathing. Clear to ausculation bilaterally. No wheezes, rhonchi, or rales.  Abdomen: Soft, non-distended, and non-tender to palpation. Bowel sounds present. MSK: Normal strength and tone for age. Extremities: No lower extremity edema.    Skin: Warm and dry. Neuro: Alert and oriented x3. No focal deficits. Psych: Normal affect. Responds appropriately.   EKG:  The EKG was personally reviewed and demonstrates:  - EKG from 08/17/2021 at 11:42am: Atrial fibrillation, rate 150 bpm, with incomplete RBBB, with non-specific ST/T changes - EKG from 08/17/2021 at 14:26pm: Atrial fibrillation, rate 135 bpm, with incomplete RBBB with T wave inversions in leads V2-V3. - EKG from 08/17/2021 at 14:45pm: Atrial fibrillation, rate 112 bpm, with no acute ST/T changes.   Telemetry:  Telemetry was personally reviewed and demonstrates:  Atrial fibrillation/flutter with rates in the 130s to 150s. Some PVCs (occasionally in bigeminy). Also short runs of an idiopathic ventricular rhythm (longest run about 6 beats).   Relevant CV Studies:   Myoview 09/04/2019: Impressions: 1. No reversible ischemia or infarction.  2. Normal left ventricular wall motion.  3. Left ventricular ejection fraction 70%  4. Non invasive risk stratification*: Low  _______________   Echocardiogram 09/22/2020 [facility]): Impressions: Left ventricle: The left ventricular size is normal. There is normal left ventricular wall thickness. LV ejection fraction = 60-65%. Left ventricular  systolic function is normal. The left ventricular wall motion is normal.    Effusion: There is no pericardial effusion. There is no pleural effusion. No cardiac tamponade.    Laboratory Data:   High Sensitivity Troponin:   Last Labs       Recent Labs  Lab 08/17/21 1147 08/17/21 1350  TROPONINIHS 7 6       Chemistry Last Labs      Recent Labs  Lab 08/17/21 1147  NA 139  K 4.8  CL 108  CO2 25  GLUCOSE 121*  BUN 19  CREATININE 1.37*  CALCIUM 9.2  GFRNONAA 51*  ANIONGAP 6      Last Labs   No results for input(s): PROT, ALBUMIN, AST, ALT, ALKPHOS, BILITOT in the last 168 hours.   Hematology Last Labs      Recent Labs  Lab 08/17/21 1147  WBC 4.9  RBC 3.37*  HGB 11.3*  HCT 34.1*  MCV 101.2*  MCH 33.5  MCHC 33.1  RDW 22.7*  PLT 277      BNP Last Labs   No results for input(s): BNP, PROBNP in the last 168 hours.    DDimer  Last Labs   No results for input(s): DDIMER in the last 168 hours.       Radiology/Studies:  DG Chest Port 1 View   Result Date: 08/17/2021 CLINICAL DATA:  Chest pain, shortness of breath EXAM: PORTABLE CHEST 1 VIEW COMPARISON:  06/05/2021 FINDINGS: Stable positioning of a left-sided implanted cardiac device. Cardiomediastinal contours are within normal limits. Mild left basilar atelectasis. Lungs are otherwise clear. No pleural effusion or pneumothorax. IMPRESSION: Mild left basilar atelectasis. Electronically Signed   By: Davina Poke D.O.   On: 08/17/2021 12:43       Assessment and Plan:    Persistent Atrial Fibrillation/Flutter Tachybrady Syndrome s/p St Jude PPM - Patient has known persistent atrial fibrillation as well as a history of atrial flutter with difficult to control rates due to orthostatic hypotension. S/p ablation in 2015 and more recent DCCV in 06/2021 but converted back to atrial fibrillation 3 days later. Previously on Flecainide but unable to tolerate it. Now on Propafenone. Unable to use Amiodarone due to  lung disease. Plan was for [facility] nodal ablation at [facility] on 08/23/2021. Presented in atrial fibrillation with rates as high as the 150s with associated chest tightness, shortness of breath, lightheadedness, and dizziness. - High-sensitivity troponin negative x2. - Potassium 4.8.  - Magnesium 2.0.  - Will check TSH. - Will check Echo. - Started on IV Cardizem but had an episode of lightheadedness/dizziness so this was stopped. Will use IV Cardizem '5mg'$  as needed for symptomatic tachycardiac with rates > 130 bpm. - Will stop home Propafenone since it is not working. - Will start Digoxin - will give IV Digoxin 0.'25mg'$  tonight and then start PO Digoxin 0.'125mg'$  tomorrow. - Continue chronic anticoagulation with Eliquis '5mg'$  twice daily.  - Will have EP see tomorrow to discuss repeat atrial fibrillation ablation, vs Tikosyn load followed by DCCV, vs AV nodal ablation.    Orthostatic Hypotension - History of orthostatic hypotension. Previously on Midodrine but no longer. - BP currently stable but will need to watch if patient requires IV Cardizem for rate control.   Restrictive Lung Disease - Stable. Not on  any medications at home. - Followed by Pulmonology as outpatient.  CKD Stage III - Creatinine 1.37 on admission. Baseline around 1.1. - Will continue to monitor.   Risk Assessment/Risk Scores:      CHA2DS2-VASc Score = 2  This indicates a 2.2% annual risk of stroke. The patient's score is based upon: CHF History: No HTN History: No Diabetes History: No Stroke History: No Vascular Disease History: No Age Score: 2 Gender Score: 0    For questions or updates, please contact [facility] Please consult [email or web address] for contact info under      Signed, Todd Mclean, PA-C  08/17/2021 5:22 PM     Personally seen and examined. Agree with APP above with the following comments: Briefly 85 yo M with a history of Persistent atrial fibrillation on Eliquis whit prior AF  ablation in 2015  who presents with AF RVR and desire to transition his electrophysiology team.  Patient and family note that he did well post his PVI in 2015 but this did not last longer than a couple of years. - notes issues with beta blockers related to his orthostatic hypotension; family unclear if this also involves sotalol but daughter notes that all beta blockers have given them trouble - notes intolerance to flecainide (unclear) - with iv Cardizem had transient hypotension and this was stopped - has not tried digoxin or tikoysn before; was planned for AVJ ablation. - has not tried amiodarone because of lung disease Exam notable for irregular tachycardia; decreased breath sounds bilaterally, no edema; weaned to 1 L O2 during exam. Labs notable for troponin 6 Personally reviewed relevant tests; EKG consistent with AF rate 150 with RBBB; tele shows improved rate control Would recommend  - short term will tolerate heart rates 120-130s - we are starting digoxin to help rate control; can give short IV doses of diltiazem for symptomatic tachycardia rates 130-140s - unclear etiology of restrictive lung disease; if amiodarone is to be considered would get HRCT non con prior;he is not on O2 at baseline - options include potentially Tikosyn (added to DCCV schedule for Friday PM); AV nodal ablation (has St. DC PPM) or repeat AF ablation.  EP will see in AM 08/18/21 - will get echo - discussed with patient an all of his family members (three sons and daugther)   Todd Haskell, MD [place]  [address], [address] [place], [place] [address] [phone]  6:05 PM

## 2021-08-18 ENCOUNTER — Observation Stay (HOSPITAL_COMMUNITY): Payer: PPO

## 2021-08-18 DIAGNOSIS — R000 Tachycardia, unspecified: Secondary | ICD-10-CM | POA: Diagnosis present

## 2021-08-18 DIAGNOSIS — J479 Bronchiectasis, uncomplicated: Secondary | ICD-10-CM | POA: Diagnosis present

## 2021-08-18 DIAGNOSIS — I129 Hypertensive chronic kidney disease with stage 1 through stage 4 chronic kidney disease, or unspecified chronic kidney disease: Secondary | ICD-10-CM | POA: Diagnosis present

## 2021-08-18 DIAGNOSIS — R110 Nausea: Secondary | ICD-10-CM | POA: Diagnosis not present

## 2021-08-18 DIAGNOSIS — I4510 Unspecified right bundle-branch block: Secondary | ICD-10-CM | POA: Diagnosis present

## 2021-08-18 DIAGNOSIS — Z882 Allergy status to sulfonamides status: Secondary | ICD-10-CM | POA: Diagnosis not present

## 2021-08-18 DIAGNOSIS — I951 Orthostatic hypotension: Secondary | ICD-10-CM | POA: Diagnosis present

## 2021-08-18 DIAGNOSIS — D638 Anemia in other chronic diseases classified elsewhere: Secondary | ICD-10-CM | POA: Diagnosis present

## 2021-08-18 DIAGNOSIS — Z885 Allergy status to narcotic agent status: Secondary | ICD-10-CM | POA: Diagnosis not present

## 2021-08-18 DIAGNOSIS — I4819 Other persistent atrial fibrillation: Secondary | ICD-10-CM | POA: Diagnosis present

## 2021-08-18 DIAGNOSIS — Z8546 Personal history of malignant neoplasm of prostate: Secondary | ICD-10-CM | POA: Diagnosis not present

## 2021-08-18 DIAGNOSIS — J984 Other disorders of lung: Secondary | ICD-10-CM | POA: Diagnosis present

## 2021-08-18 DIAGNOSIS — Z87891 Personal history of nicotine dependence: Secondary | ICD-10-CM | POA: Diagnosis not present

## 2021-08-18 DIAGNOSIS — I2510 Atherosclerotic heart disease of native coronary artery without angina pectoris: Secondary | ICD-10-CM | POA: Diagnosis present

## 2021-08-18 DIAGNOSIS — N183 Chronic kidney disease, stage 3 unspecified: Secondary | ICD-10-CM | POA: Diagnosis present

## 2021-08-18 DIAGNOSIS — F419 Anxiety disorder, unspecified: Secondary | ICD-10-CM | POA: Diagnosis present

## 2021-08-18 DIAGNOSIS — Z79899 Other long term (current) drug therapy: Secondary | ICD-10-CM | POA: Diagnosis not present

## 2021-08-18 DIAGNOSIS — Z888 Allergy status to other drugs, medicaments and biological substances status: Secondary | ICD-10-CM | POA: Diagnosis not present

## 2021-08-18 DIAGNOSIS — J9811 Atelectasis: Secondary | ICD-10-CM | POA: Diagnosis present

## 2021-08-18 DIAGNOSIS — Z7901 Long term (current) use of anticoagulants: Secondary | ICD-10-CM | POA: Diagnosis not present

## 2021-08-18 DIAGNOSIS — Z981 Arthrodesis status: Secondary | ICD-10-CM | POA: Diagnosis not present

## 2021-08-18 DIAGNOSIS — I4891 Unspecified atrial fibrillation: Secondary | ICD-10-CM | POA: Diagnosis not present

## 2021-08-18 DIAGNOSIS — Z20822 Contact with and (suspected) exposure to covid-19: Secondary | ICD-10-CM | POA: Diagnosis present

## 2021-08-18 LAB — BASIC METABOLIC PANEL
Anion gap: 5 (ref 5–15)
BUN: 17 mg/dL (ref 8–23)
CO2: 23 mmol/L (ref 22–32)
Calcium: 8.4 mg/dL — ABNORMAL LOW (ref 8.9–10.3)
Chloride: 110 mmol/L (ref 98–111)
Creatinine, Ser: 1.19 mg/dL (ref 0.61–1.24)
GFR, Estimated: >60 mL/min (ref >60)
Glucose, Bld: 85 mg/dL (ref 70–99)
Potassium: 4.3 mmol/L (ref 3.5–5.1)
Sodium: 138 mmol/L (ref 135–145)

## 2021-08-18 LAB — MAGNESIUM: Magnesium: 1.8 mg/dL (ref 1.7–2.4)

## 2021-08-18 MED ORDER — DILTIAZEM HCL 25 MG/5ML IV SOLN
5.0000 mg | INTRAVENOUS | Status: DC | PRN
  Filled 2021-08-18 (×2): qty 5

## 2021-08-18 MED ORDER — SODIUM CHLORIDE 0.9% FLUSH: 3.0000 mL | INTRAVENOUS | Status: DC | PRN

## 2021-08-18 MED ORDER — MAGNESIUM SULFATE 2 GM/50ML IV SOLN
2.0000 g | Freq: Once | INTRAVENOUS | Status: AC
  Administered 2021-08-18: 2 g via INTRAVENOUS
  Filled 2021-08-18: qty 50

## 2021-08-18 MED ORDER — SODIUM CHLORIDE 0.9% FLUSH
3.0000 mL | Freq: Two times a day (BID) | INTRAVENOUS | Status: DC
  Administered 2021-08-18 – 2021-08-21 (×7): 3 mL via INTRAVENOUS

## 2021-08-18 MED ORDER — METOPROLOL TARTRATE 5 MG/5ML IV SOLN: 5.0000 mg | INTRAVENOUS | Status: DC | PRN

## 2021-08-18 MED ORDER — DOFETILIDE 250 MCG PO CAPS
250.0000 ug | ORAL_CAPSULE | Freq: Two times a day (BID) | ORAL | Status: DC
Start: 2021-08-18 — End: 2021-08-21
  Administered 2021-08-18 – 2021-08-21 (×6): 250 ug via ORAL
  Filled 2021-08-18 (×6): qty 1

## 2021-08-18 MED ORDER — SODIUM CHLORIDE 0.9 % IV SOLN: 250.0000 mL | INTRAVENOUS | Status: DC | PRN

## 2021-08-18 NOTE — Progress Notes (Signed)
Pharmacy Review for Dofetilide (Tikosyn) Initiation  Admit Complaint: 85 y.o. male admitted 08/17/2021 with atrial fibrillation to be initiated on dofetilide.   Assessment:  Patient Exclusion Criteria: If any screening criteria checked as "Yes", then  patient  should NOT receive dofetilide until criteria item is corrected. If "Yes" please indicate correction plan.  YES  NO Patient  Exclusion Criteria Correction Plan  '[x]'$  '[]'$  Baseline QTc interval is greater than or equal to 440 msec. IF above YES box checked dofetilide contraindicated unless patient has ICD; then may proceed if QTc 500-550 msec or with known ventricular conduction abnormalities may proceed with QTc 550-600 msec. QTc =  499 while in Aflutter  Dr. Curt Bears reviewed EKG > OK to start Tikosyn.  New EKG also ordered  '[]'$  '[x]'$  Magnesium level is less than 1.8 mEq/l : Last magnesium:  Lab Results  Component Value Date   MG 2.0 08/17/2021         '[]'$  '[x]'$  Potassium level is less than 4 mEq/l : Last potassium:  Lab Results  Component Value Date   K 4.3 08/18/2021         '[]'$  '[x]'$  Patient is known or suspected to have a digoxin level greater than 2 ng/ml: No results found for: DIGOXIN Digoxin started 9/6 PM  Consider checking a level at [facility]  '[]'$  '[x]'$  Creatinine clearance less than 20 ml/min (calculated using Cockcroft-Gault, actual body weight and serum creatinine): Estimated Creatinine Clearance: 55.6 mL/min (by C-G formula based on SCr of 1.19 mg/dL).    '[]'$  '[x]'$  Patient has received drugs known to prolong the QT intervals within the last 48 hours (phenothiazines, tricyclics or tetracyclic antidepressants, erythromycin, H-1 antihistamines, cisapride, fluoroquinolones, azithromycin). Drugs not listed above may have an, as yet, undetected potential to prolong the QT interval, updated information on QT prolonging agents is available at this website:QT prolonging agents   '[]'$  '[x]'$  Patient received a dose of hydrochlorothiazide (Oretic) alone  or in any combination including triamterene (Dyazide, Maxzide) in the last 48 hours.   '[]'$  '[x]'$  Patient received a medication known to increase dofetilide plasma concentrations prior to initial dofetilide dose:  Trimethoprim (Primsol, Proloprim) in the last 36 hours Verapamil (Calan, Verelan) in the last 36 hours or a sustained release dose in the last 72 hours Megestrol (Megace) in the last 5 days  Cimetidine (Tagamet) in the last 6 hours Ketoconazole (Nizoral) in the last 24 hours Itraconazole (Sporanox) in the last 48 hours  Prochlorperazine (Compazine) in the last 36 hours    '[]'$  '[x]'$  Patient is known to have a history of torsades de pointes; congenital or acquired long QT syndromes.   '[]'$  '[x]'$  Patient has received a Class 1 antiarrhythmic with less than 2 half-lives since last dose. (Disopyramide, Quinidine, Procainamide, Lidocaine, Mexiletine, Flecainide, Propafenone) - last dose of propafenone was 9/6 at 0700   '[]'$  '[x]'$  Patient has received amiodarone therapy in the past 3 months or amiodarone level is greater than 0.3 ng/ml.    Patient has been appropriately anticoagulated with Eliquis.  Ordering provider was confirmed at [email or web address] if they are not listed on the [place] Prescribers list.  Goal of Therapy: Follow renal function, electrolytes, potential drug interactions, and dose adjustment. Provide education and 1 week supply at discharge.  Plan:  '[x]'$   Physician selected initial dose within range recommended for patients level of renal function - will monitor for response.  '[]'$   Physician selected initial dose outside of range recommended for patients level of renal function -  will discuss if the dose should be altered at this time.   Select One Calculated CrCl  Dose q12h  '[]'$  > 60 ml/min 500 mcg  '[x]'$  40-60 ml/min 250 mcg  '[]'$  20-40 ml/min 125 mcg   2. Follow up QTc after the first 5 doses, renal function, electrolytes (K & Mg) daily x 3 days, dose adjustment, success  of initiation and facilitate 1 week discharge supply as clinically indicated.  3. Initiate Tikosyn education video (Call [phone] and ask for video # 116).  4. Place Enrollment Form on the chart for discharge supply of dofetilide.  Raife Lizer D. Mina Marble, PharmD, BCPS, [place] 08/18/2021, 12:29 PM

## 2021-08-18 NOTE — Progress Notes (Signed)
Progress Note  Patient Name: Todd Salazar Date of Encounter: 08/18/2021  Primary Cardiologist: [facility]; Reola Calkins to [place]; new to EP  Subjective   Persistent tachycardia overnight. Patient notes no symptoms with heart rates in 130s  Inpatient Medications    Scheduled Meds:  apixaban  5 mg Oral BID   digoxin  0.125 mg Oral Daily   Continuous Infusions:  PRN Meds: acetaminophen, nitroGLYCERIN   Vital Signs    Vitals:   08/18/21 0508 08/18/21 0600 08/18/21 0700 08/18/21 0753  BP: '91/73 91/73 98/77 '$ 100/76  Pulse: (!) 135 (!) 134 (!) 134 (!) 136  Resp: '20 16 19 20  '$ Temp: 98 F (36.7 C)   98.3 F (36.8 C)  TempSrc:    Oral  SpO2:  96% 95% 96%  Weight:      Height:        Intake/Output Summary (Last 24 hours) at 08/18/2021 0943 Last data filed at 08/18/2021 0908 Gross per 24 hour  Intake 1238.64 ml  Output 420 ml  Net 818.64 ml   Filed Weights   08/18/21 0002  Weight: 83.6 kg    Telemetry    AF RVR - Personally Reviewed  ECG    No new - Personally Reviewed  Physical Exam   GEN: No acute distress.   Neck: No JVD Cardiac: IRIR tachycardia no murmurs, rubs, or gallops.  Respiratory: Clear to auscultation bilaterally. GI: Soft, nontender, non-distended  MS: No edema; No deformity. Neuro:  Nonfocal  Psych: Normal affect   Labs    Chemistry Recent Labs  Lab 08/17/21 1147 08/18/21 0138  NA 139 138  K 4.8 4.3  CL 108 110  CO2 25 23  GLUCOSE 121* 85  BUN 19 17  CREATININE 1.37* 1.19  CALCIUM 9.2 8.4*  GFRNONAA 51* >60  ANIONGAP 6 5     Hematology Recent Labs  Lab 08/17/21 1147  WBC 4.9  RBC 3.37*  HGB 11.3*  HCT 34.1*  MCV 101.2*  MCH 33.5  MCHC 33.1  RDW 22.7*  PLT 277    Cardiac EnzymesNo results for input(s): TROPONINI in the last 168 hours. No results for input(s): TROPIPOC in the last 168 hours.   BNPNo results for input(s): BNP, PROBNP in the last 168 hours.   DDimer No results for input(s): DDIMER in the last  168 hours.   Radiology    DG Chest Port 1 View  Result Date: 08/17/2021 CLINICAL DATA:  Chest pain, shortness of breath EXAM: PORTABLE CHEST 1 VIEW COMPARISON:  06/05/2021 FINDINGS: Stable positioning of a left-sided implanted cardiac device. Cardiomediastinal contours are within normal limits. Mild left basilar atelectasis. Lungs are otherwise clear. No pleural effusion or pneumothorax. IMPRESSION: Mild left basilar atelectasis. Electronically Signed   By: Davina Poke D.O.   On: 08/17/2021 12:43    Cardiac Studies   Echo is pending  Patient Profile     85 y.o. male restrictive lung disease NOS and persistent atrial fibrillation who presents with AF RVR  Assessment & Plan    PAF CHADSVASC of  Orthostatic Hypotension - had hypotension requiring cessation or diltiazem drip in the ED; started digoxin 08/17/21 and added IV diltiazem push as PRN given that he did not tolerate the drip - has had orthostatic hypotension with all BB per family - has not tolerated Flecainide; has breakthrough AF on propafenone - s/p Ablation in 2015 - EP to see this AM who will drive treatment plan (see note from 08/17/21) - continue eliquis  Restrictive lung disease NOS - weaned to room air - per 06/02/20 pulm not: He had previously undergone PFTs that showed evidence of moderate restriction on spirometry and severe reduction in diffusing capacity. 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 Prior TB QuantiFERON gold and fungal antibody panel were negative. Recent HRCT chest finding shows minimal progression of prior findings, fluid noted in fissure of lungs.  - will need outpatient f/u  DNI- no other limitations in therapy Labs ordered Planned DC to home DVT PPX: eliquis Cardiac Diet  For questions or updates, please contact [facility] Please consult [email or web address] for contact info under Cardiology/STEMI.       Signed, Werner Lean, MD  08/18/2021, 9:43 AM

## 2021-08-18 NOTE — Consult Note (Addendum)
Cardiology Consultation:   Patient ID: Todd Salazar MRN: [id number]; DOB: [birth year]  Admit date: 08/17/2021 Date of Consult: 08/18/2021  PCP:  [name], DO   [facility] Cardiologist:  Dr. Minna Merritts  Patient Profile:   Todd Salazar is a 85 y.o. male with a hx of persistent Afib, Atach, bronchiectasis/restrictive lung disease, symptomatic bradycardia with PPM, orthostatic hypotension, prostate cancer, CKD (III), who is being seen 08/18/2021 for the evaluation of AFib rate/rhythm strategies at the request of Dr. Gasper Sells.  History of Present Illness:   Todd Salazar saw Dr. Minna Merritts last 07/09/21 discussed difficulties in rate control 2/2 his orthostatic hypotension and BP intolerance, and limitations on AAD with restrictive lung disease.  he reports cryoablation (presumed PCI) in 2015, and not felt an ablation candidate.currently treated with propafenone, he was tachycardic that day and recommended proceeding with AVnode ablation.  In the meantime his propafenone was increased 300 mg milligrams in the morning and also take 150 mg middle of the day and 150 mg at night and added Cardizem short-acting 30 mg 3 times a day   Note Hx of flecainide and sotalol previously tried (? 2/2 bradycardia, unclear, these were piror to pacer)  He saw pulmonary service 07/13/21 noted Prior PFTs completed a few years ago showed evidence of moderate restrictive lung disease. High-resolution CT chest completed in February 2021 showed evidence of mild bronchiectatic changes and nodularity, slightly progressive compared to prior CT completed the year before, planned for follow up CT, no other recommendations were made.  He was admitted to [facility] yesterday with persistent tachycardia, some degree of chest discomfort, progressive fatigue, arrived in AFib RVR, started on dilt gtt with rate control, though pt felt poorly with SBP 90's and PRN dilt planned, his propafenone stopped and started on digoxin  EP was  asked to weigh in for options of rate control, the patient seeking another EP opinion with reservations about AV node ablation  LABS K+ 4.3 Mag 2.0 BUN/Creat 17/1.19 HS Trop 7,6 WBC 4.9 H/H 11/34 Plts 277 TSH 1.677  Generally he describes himself as active, stays busy but the Afib and especially RVR really make him drained. Last dose of propafenone was yesterday AM prior to coming in  Past Medical History:  Diagnosis Date   Arthritis    Atrial fibrillation (HCC)    Cancer (HCC)    prostate   Dysrhythmia    afib   History of kidney stones     Past Surgical History:  Procedure Laterality Date   Bone spur     CHOLECYSTECTOMY     HERNIA REPAIR     JOINT REPLACEMENT     kidney stones     LAMINECTOMY WITH POSTERIOR LATERAL ARTHRODESIS LEVEL 1 Right 11/25/2019   Procedure: Laminectomy and Foraminotomy  Lumbar Four-Lumbar Five - right, with instrumented fusion;  Surgeon: Eustace Moore, MD;  Location: [facility];  Service: Neurosurgery;  Laterality: Right;  Laminectomy and Foraminotomy  Lumbar Four-Lumbar Five - right, with instrumented fusion   PROSTATE SURGERY       Home Medications:  Prior to Admission medications   Medication Sig Start Date End Date Taking? Authorizing Provider  apixaban (ELIQUIS) 5 MG TABS tablet Take 5 mg by mouth 2 (two) times daily.   Yes [provider]  nitroGLYCERIN (NITROSTAT) 0.4 MG SL tablet Place 0.4 mg under the tongue every 5 (five) minutes as needed for chest pain.  07/25/19  Yes [provider]  propafenone (RYTHMOL) 150 MG tablet Take  150-300 mg by mouth as directed. Take 2 tablets (300 mg) in the morning, Take 1 tablet (150 mg) in the afternoon, Take 1 tablet (150 mg) at bedtime 07/09/21  Yes [provider]  oxyCODONE (OXY IR/ROXICODONE) 5 MG immediate release tablet Take 1 tablet (5 mg total) by mouth every 4 (four) hours as needed for moderate pain ((score 4 to 6)). Patient not taking: Reported on 08/17/2021 11/26/19    Eustace Moore, MD  tiZANidine (ZANAFLEX) 2 MG tablet Take 1 tablet (2 mg total) by mouth every 8 (eight) hours as needed for muscle spasms. Patient not taking: Reported on 08/17/2021 11/26/19   Eustace Moore, MD    Inpatient Medications: Scheduled Meds:  apixaban  5 mg Oral BID   digoxin  0.125 mg Oral Daily   Continuous Infusions:  PRN Meds: acetaminophen, diltiazem, nitroGLYCERIN  Allergies:    Allergies  Allergen Reactions   Tramadol Swelling    Laryngeal edema   Duloxetine Other (See Comments)    delusions   Gabapentin Other (See Comments)    Affects eyesight   Hydrocodone-Acetaminophen Nausea And Vomiting   Prednisone Nausea And Vomiting and Other (See Comments)    confusion   Nisoldipine Other (See Comments)   Ciprofloxacin Other (See Comments)    Dizziness    Methocarbamol Nausea Only   Ondansetron Hcl Nausea Only   Sulfa Antibiotics Rash    Social History:   Social History   Socioeconomic History   Marital status: Unknown    Spouse name: Not on file   Number of children: Not on file   Years of education: Not on file   Highest education level: Not on file  Occupational History   Not on file  Tobacco Use   Smoking status: Former    Types: Cigarettes    Quit date: 12/10/1996    Years since quitting: 24.7   Smokeless tobacco: Never  Vaping Use   Vaping Use: Never used  Substance and Sexual Activity   Alcohol use: No   Drug use: No   Sexual activity: Never  Other Topics Concern   Not on file  Social History Narrative   Not on file   Social Determinants of Health   Financial Resource Strain: Not on file  Food Insecurity: Not on file  Transportation Needs: Not on file  Physical Activity: Not on file  Stress: Not on file  Social Connections: Not on file  Intimate Partner Violence: Not on file    Family History:   Family History  Problem Relation Age of Onset   CAD Neg Hx    Heart failure Neg Hx    Stroke Neg Hx      ROS:  Please see  the history of present illness.  All other ROS reviewed and negative.     Physical Exam/Data:   Vitals:   08/18/21 0600 08/18/21 0700 08/18/21 0753 08/18/21 1037  BP: 91/73 98/77 100/76 90/66  Pulse: (!) 134 (!) 134 (!) 136 (!) 138  Resp: '16 19 20 20  '$ Temp:   98.3 F (36.8 C) 97.8 F (36.6 C)  TempSrc:   Oral Oral  SpO2: 96% 95% 96% 94%  Weight:      Height:        Intake/Output Summary (Last 24 hours) at 08/18/2021 1050 Last data filed at 08/18/2021 0908 Gross per 24 hour  Intake 1238.64 ml  Output 420 ml  Net 818.64 ml   Last 3 Weights 08/18/2021 11/25/2019 11/21/2019  Weight (  lbs) 184 lb 4.9 oz 180 lb 185 lb  Weight (kg) 83.6 kg 81.647 kg 83.915 kg     Body mass index is 23.04 kg/m.  General:  Well nourished, well developed, in no acute distress HEENT: normal Lymph: no adenopathy Neck: no JVD Endocrine:  No thryomegaly Vascular: No carotid bruits; FA pulses 2+ bilaterally without bruits  Cardiac:  irreg-irreg, tachycardic; no murmurs, gallops or rubs Lungs:  CTA b/l, no wheezing, rhonchi or rales  Abd: soft, nontender, no hepatomegaly  Ext: no edema Musculoskeletal:  No deformities Skin: warm and dry  Neuro:  no focal abnormalities noted Psych:  Normal affect   EKG:  The EKG was personally reviewed and demonstrates:    AFlutter 150bpm, RBBB AFlutter/fib 135bpm, RBBB AFib/flutter 112bpm, RBBB  OLD 11/21/2019 SB/sinus arrhythmia 54bpm, normal intervals  Telemetry:  Telemetry was personally reviewed and demonstrates:   Afib 130's  Relevant CV Studies:  07/23/20; TTE SUMMARY  Left ventricular systolic function is normal.  LV ejection fraction = 60-65%.  There is mild aortic valve sclerosis noted, with no evidence of  stenosis.  There is trace aortic regurgitation.  There is mild mitral regurgitation.  There is moderate tricuspid regurgitation.  Estimated right ventricular systolic pressure is 40 mmHg.  Mild pulmonary hypertension.  There is no  comparison study available.    Myoview 09/04/2019: Impressions: 1. No reversible ischemia or infarction.  2. Normal left ventricular wall motion.  3. Left ventricular ejection fraction 70%  4. Non invasive risk stratification*: Low   Laboratory Data:  High Sensitivity Troponin:   Recent Labs  Lab 08/17/21 1147 08/17/21 1350  TROPONINIHS 7 6     Chemistry Recent Labs  Lab 08/17/21 1147 08/18/21 0138  NA 139 138  K 4.8 4.3  CL 108 110  CO2 25 23  GLUCOSE 121* 85  BUN 19 17  CREATININE 1.37* 1.19  CALCIUM 9.2 8.4*  GFRNONAA 51* >60  ANIONGAP 6 5    No results for input(s): PROT, ALBUMIN, AST, ALT, ALKPHOS, BILITOT in the last 168 hours. Hematology Recent Labs  Lab 08/17/21 1147  WBC 4.9  RBC 3.37*  HGB 11.3*  HCT 34.1*  MCV 101.2*  MCH 33.5  MCHC 33.1  RDW 22.7*  PLT 277   BNPNo results for input(s): BNP, PROBNP in the last 168 hours.  DDimer No results for input(s): DDIMER in the last 168 hours.   Radiology/Studies:  DG Chest Port 1 View  Result Date: 08/17/2021 CLINICAL DATA:  Chest pain, shortness of breath EXAM: PORTABLE CHEST 1 VIEW COMPARISON:  06/05/2021 FINDINGS: Stable positioning of a left-sided implanted cardiac device. Cardiomediastinal contours are within normal limits. Mild left basilar atelectasis. Lungs are otherwise clear. No pleural effusion or pneumothorax. IMPRESSION: Mild left basilar atelectasis. Electronically Signed   By: Davina Poke D.O.   On: 08/17/2021 12:43     Assessment and Plan:   Persistent AFib Dr. Minna Merritts also mentions ATach EKGs perhaps an atypical flutter vs coarse AF CHA2DS2Vasc is 2, for age, on Eliquis appropriately dosed On propafenone with recurrent arrhythmia and stopped on admission  EKGs are reviewed with Dr. Curt Bears, QTc looks acceptable for Tikosyn No contraindicated medicines, ok to start tonight with last dose of propafenone yesterday AM Electrolytes are OK Creat 1.19 (calc cr cl is 56) Patient  confirms no missed doses of his eliquis in the last 3 weeks Plan DCCV Friday if not in [place] report from yesterday is reviewed Pacer with stable measurements  AF  burden 87%  Patient is agreeable  Decarla Siemen plan for Tikosyn tonight     Risk Assessment/Risk Scores:    For questions or updates, please contact [place] Please consult [email or web address] for contact info under    Signed, Baldwin Jamaica, PA-C  08/18/2021 10:50 AM;t  I have seen and examined this patient with Tommye Standard.  Agree with above, note added to reflect my findings.  On exam, tachycardic, irregular.  Patient was admitted with weakness and fatigue, noted to be in rapid atrial fibrillation.  He has a Agricultural engineer pacemaker implanted for tachybradycardia syndrome.  He has been on both flecainide and sotalol in the past, though his his sotalol was stopped due to bradycardia.  He is quite tachycardic today with heart rates in the 130s.  I do think that a rhythm control option would be most beneficial for him.  He has weakness and fatigue continued, and blood pressures have been low which limits rate controlling medications.  Due to that, we Lynee Rosenbach start him on dofetilide today.  We Sham Alviar continue to monitor with ECGs.  Rudene Poulsen likely need a cardioversion after his fourth dose.  Denney Shein M. Min Tunnell MD 08/18/2021 12:08 PM

## 2021-08-19 ENCOUNTER — Inpatient Hospital Stay (HOSPITAL_COMMUNITY): Payer: PPO

## 2021-08-19 ENCOUNTER — Other Ambulatory Visit (HOSPITAL_COMMUNITY): Payer: Self-pay

## 2021-08-19 ENCOUNTER — Other Ambulatory Visit (HOSPITAL_BASED_OUTPATIENT_CLINIC_OR_DEPARTMENT_OTHER): Payer: Self-pay

## 2021-08-19 DIAGNOSIS — I4891 Unspecified atrial fibrillation: Secondary | ICD-10-CM | POA: Diagnosis not present

## 2021-08-19 DIAGNOSIS — I4819 Other persistent atrial fibrillation: Secondary | ICD-10-CM | POA: Diagnosis not present

## 2021-08-19 LAB — ECHOCARDIOGRAM COMPLETE
Area-P 1/2: 3.5 cm2
Height: 75 in
S' Lateral: 3.5 cm
Weight: 2937.6 oz

## 2021-08-19 LAB — CBC
HCT: 30.5 % — ABNORMAL LOW (ref 39.0–52.0)
Hemoglobin: 10.3 g/dL — ABNORMAL LOW (ref 13.0–17.0)
MCH: 33.8 pg (ref 26.0–34.0)
MCHC: 33.8 g/dL (ref 30.0–36.0)
MCV: 100 fL (ref 80.0–100.0)
Platelets: 232 10*3/uL (ref 150–400)
RBC: 3.05 MIL/uL — ABNORMAL LOW (ref 4.22–5.81)
RDW: 22.4 % — ABNORMAL HIGH (ref 11.5–15.5)
WBC: 6.2 10*3/uL (ref 4.0–10.5)
nRBC: 0.6 % — ABNORMAL HIGH (ref 0.0–0.2)

## 2021-08-19 LAB — MAGNESIUM: Magnesium: 2 mg/dL (ref 1.7–2.4)

## 2021-08-19 LAB — BASIC METABOLIC PANEL
Anion gap: 5 (ref 5–15)
BUN: 15 mg/dL (ref 8–23)
CO2: 24 mmol/L (ref 22–32)
Calcium: 9 mg/dL (ref 8.9–10.3)
Chloride: 108 mmol/L (ref 98–111)
Creatinine, Ser: 1.27 mg/dL — ABNORMAL HIGH (ref 0.61–1.24)
GFR, Estimated: 56 mL/min — ABNORMAL LOW (ref >60)
Glucose, Bld: 101 mg/dL — ABNORMAL HIGH (ref 70–99)
Potassium: 4.7 mmol/L (ref 3.5–5.1)
Sodium: 137 mmol/L (ref 135–145)

## 2021-08-19 NOTE — Progress Notes (Signed)
Called the patient's listed pharmacy [facility] they confirm that currently they have stock of both 224mg and 1240m strengths dofetilide in stock. ReTommye StandardPA-C

## 2021-08-19 NOTE — Progress Notes (Addendum)
Progress Note  Patient Name: Todd Salazar Date of Encounter: 08/19/2021  [facility] Cardiologist: Dr. Minna Merritts  Subjective   Just getting up , but does think he can tell he feels better  Inpatient Medications    Scheduled Meds:  apixaban  5 mg Oral BID   dofetilide  250 mcg Oral BID   sodium chloride flush  3 mL Intravenous Q12H   Continuous Infusions:  sodium chloride     PRN Meds: sodium chloride, acetaminophen, diltiazem, nitroGLYCERIN, sodium chloride flush   Vital Signs    Vitals:   08/18/21 1900 08/18/21 1954 08/19/21 0000 08/19/21 0400  BP: 97/65 98/67 103/63 102/71  Pulse: (!) 125 (!) 108 (!) 108 69  Resp: '19 20 16 17  '$ Temp: 98.3 F (36.8 C) 98.6 F (37 C) 98.1 F (36.7 C) 97.7 F (36.5 C)  TempSrc: Oral Oral Oral Oral  SpO2: 97% 100% 95% 97%  Weight:   83.3 kg   Height:        Intake/Output Summary (Last 24 hours) at 08/19/2021 0730 Last data filed at 08/19/2021 0426 Gross per 24 hour  Intake 818 ml  Output 600 ml  Net 218 ml   Last 3 Weights 08/19/2021 08/18/2021 11/25/2019  Weight (lbs) 183 lb 9.6 oz 184 lb 4.9 oz 180 lb  Weight (kg) 83.28 kg 83.6 kg 81.647 kg      Telemetry    Converted to SR/APacing last night - Personally Reviewed  ECG    AFib 109bpm, QTc 414m - Personally Reviewed with Dr. CCurt Bears Physical Exam   GEN: No acute distress.   Neck: No JVD Cardiac: RRR, no murmurs, rubs, or gallops.  Respiratory: CTA b/l. GI: Soft, nontender, non-distended  MS: No edema; No deformity, age appropriate atrophy Neuro:  Nonfocal  Psych: Normal affect   Labs    High Sensitivity Troponin:   Recent Labs  Lab 08/17/21 1147 08/17/21 1350  TROPONINIHS 7 6      Chemistry Recent Labs  Lab 08/17/21 1147 08/18/21 0138 08/19/21 0325  NA 139 138 137  K 4.8 4.3 4.7  CL 108 110 108  CO2 '25 23 24  '$ GLUCOSE 121* 85 101*  BUN '19 17 15  '$ CREATININE 1.37* 1.19 1.27*  CALCIUM 9.2 8.4* 9.0  GFRNONAA 51* >60 56*  ANIONGAP '6 5 5      '$ Hematology Recent Labs  Lab 08/17/21 1147 08/19/21 0325  WBC 4.9 6.2  RBC 3.37* 3.05*  HGB 11.3* 10.3*  HCT 34.1* 30.5*  MCV 101.2* 100.0  MCH 33.5 33.8  MCHC 33.1 33.8  RDW 22.7* 22.4*  PLT 277 232    BNPNo results for input(s): BNP, PROBNP in the last 168 hours.   DDimer No results for input(s): DDIMER in the last 168 hours.   Radiology    DG Chest Port 1 View  Result Date: 08/17/2021 CLINICAL DATA:  Chest pain, shortness of breath EXAM: PORTABLE CHEST 1 VIEW COMPARISON:  06/05/2021 FINDINGS: Stable positioning of a left-sided implanted cardiac device. Cardiomediastinal contours are within normal limits. Mild left basilar atelectasis. Lungs are otherwise clear. No pleural effusion or pneumothorax. IMPRESSION: Mild left basilar atelectasis. Electronically Signed   By: NDavina PokeD.O.   On: 08/17/2021 12:43    Cardiac Studies     07/23/20; TTE SUMMARY  Left ventricular systolic function is normal.  LV ejection fraction = 60-65%.  There is mild aortic valve sclerosis noted, with no evidence of  stenosis.  There is trace aortic regurgitation.  There is mild mitral regurgitation.  There is moderate tricuspid regurgitation.  Estimated right ventricular systolic pressure is 40 mmHg.  Mild pulmonary hypertension.  There is no comparison study available.      Myoview 09/04/2019: Impressions: 1. No reversible ischemia or infarction.  2. Normal left ventricular wall motion.  3. Left ventricular ejection fraction 70%  4. Non invasive risk stratification*: Low   Patient Profile     85 y.o. male with a hx of persistent Afib, Atach, bronchiectasis/restrictive lung disease, symptomatic bradycardia with PPM, orthostatic hypotension, prostate cancer, CKD (III), admitted with AFib/flutter RVR and associated progressive weakness, fatigue, SOB  Assessment & Plan    Persistent AFib Dr. Minna Merritts also mentions ATach EKGs perhaps an atypical flutter vs coarse AF CHA2DS2Vasc  is 2, for age, on Eliquis appropriately dosed, no missed doses in prior 3 weeks Tikosyn load is in progress K+ 4.7 Mag 2.0 Creat 1.27 (Calc CrCl 52, dose remains appropriate) QT stable  He has converted with drug Anticipate discharge Saturday Asier Desroches need case management cost eval today  Percy Comp need to find an outpt ph\armacy with stock, Shenicka Sunderlin also get working on that today/tomorrow    2. Baseline hypotension (orthostatic) BP looks OK No symptoms  3. Restrictive lung disease/bronchiectasis is described for him No SOB On no particular treatment outpt Follows with pulm  For questions or updates, please contact [place] Please consult [email or web address] for contact info under        Signed, Baldwin Jamaica, PA-C  08/19/2021, 7:30 AM    I have seen and examined this patient with Tommye Standard.  Agree with above, note added to reflect my findings.  On exam, RRR, no murmurs.  Patient is fortunately converted to sinus rhythm on dofetilide.  He is currently feeling well.  We Tayia Stonesifer continue with current management.  Pete Merten M. Luster Hechler MD 08/19/2021 10:19 AM

## 2021-08-19 NOTE — Progress Notes (Signed)
  Echocardiogram 2D Echocardiogram has been performed.  Todd Salazar M 08/19/2021, 8:53 AM

## 2021-08-19 NOTE — Plan of Care (Signed)
Patient progressing 

## 2021-08-20 ENCOUNTER — Encounter (HOSPITAL_COMMUNITY)
Admission: EM | Disposition: A | Discharge status: Home / Self Care | Payer: Self-pay | Source: Home / Self Care | Attending: Internal Medicine

## 2021-08-20 ENCOUNTER — Other Ambulatory Visit (HOSPITAL_COMMUNITY): Payer: Self-pay

## 2021-08-20 DIAGNOSIS — I4819 Other persistent atrial fibrillation: Secondary | ICD-10-CM | POA: Diagnosis not present

## 2021-08-20 LAB — BASIC METABOLIC PANEL
Anion gap: 8 (ref 5–15)
BUN: 17 mg/dL (ref 8–23)
CO2: 24 mmol/L (ref 22–32)
Calcium: 9.3 mg/dL (ref 8.9–10.3)
Chloride: 106 mmol/L (ref 98–111)
Creatinine, Ser: 1.18 mg/dL (ref 0.61–1.24)
GFR, Estimated: >60 mL/min (ref >60)
Glucose, Bld: 90 mg/dL (ref 70–99)
Potassium: 4.2 mmol/L (ref 3.5–5.1)
Sodium: 138 mmol/L (ref 135–145)

## 2021-08-20 LAB — CBC
HCT: 29 % — ABNORMAL LOW (ref 39.0–52.0)
Hemoglobin: 9.7 g/dL — ABNORMAL LOW (ref 13.0–17.0)
MCH: 33.8 pg (ref 26.0–34.0)
MCHC: 33.4 g/dL (ref 30.0–36.0)
MCV: 101 fL — ABNORMAL HIGH (ref 80.0–100.0)
Platelets: 220 10*3/uL (ref 150–400)
RBC: 2.87 MIL/uL — ABNORMAL LOW (ref 4.22–5.81)
RDW: 22.5 % — ABNORMAL HIGH (ref 11.5–15.5)
WBC: 4.9 10*3/uL (ref 4.0–10.5)
nRBC: 0.4 % — ABNORMAL HIGH (ref 0.0–0.2)

## 2021-08-20 LAB — MAGNESIUM: Magnesium: 2 mg/dL (ref 1.7–2.4)

## 2021-08-20 SURGERY — CARDIOVERSION
Anesthesia: General

## 2021-08-20 NOTE — Discharge Instructions (Signed)
You have an appointment set up with the [facility].  Multiple studies have shown that being followed by a dedicated atrial fibrillation clinic in addition to the standard care you receive from your other physicians improves health. We believe that enrollment in the atrial fibrillation clinic will allow [place] to better care for you.   The phone number to the [facility] is [phone]. The clinic is staffed Monday through Friday from 8:30am to 5pm.  Parking Directions: The clinic is located in the Heart and Vascular Building connected to [facility]. 1)From [address] turn on to [address] and go to the 3rd entrance  (Heart and Vascular entrance) on the right. 2)Look to the right for Heart &Vascular Parking Garage. 3)A code for the entrance is required, for Sept is 4455.   4)Take the elevators to the 1st floor. Registration is in the room with the glass walls at the end of the hallway.  If you have any trouble parking or locating the clinic, please don't hesitate to call [phone].

## 2021-08-20 NOTE — Progress Notes (Signed)
Mobility Specialist Progress Note:   08/20/21 1327  Mobility  Activity Ambulated in hall  Range of Motion/Exercises Active;All extremities  Level of Assistance Standby assist, set-up cues, supervision of patient - no hands on  Assistive Device None  Minutes Stood 5 minutes  Minutes Ambulated 5 minutes  Distance Ambulated (ft) 420 ft  Mobility Ambulated with assistance in hallway  Mobility Response Tolerated well  Mobility performed by Mobility specialist  Bed Position Semi-fowlers  $Mobility charge 1 Mobility   Pre Mobility: HR 74 bpm; BP 124/67 Post Mobility: HR 93 bpm; BP 141/73  Pt was received sitting EOB. Agreed to mobility. Ambulated in hallway 420' with standby A and was asx. Required cues to not lean forward while ambulating. Was slightly off balance during ambulation and was left EOB with all needs met.   Nelta Numbers Mobility Specialist  Phone [phone]

## 2021-08-20 NOTE — Progress Notes (Addendum)
Progress Note  Patient Name: Manpreet Mizzell Date of Encounter: 08/20/2021  [facility] Cardiologist: Dr. Minna Merritts  Subjective   he can tell he feels better  Inpatient Medications    Scheduled Meds:  apixaban  5 mg Oral BID   dofetilide  250 mcg Oral BID   sodium chloride flush  3 mL Intravenous Q12H   Continuous Infusions:  sodium chloride     PRN Meds: sodium chloride, acetaminophen, diltiazem, nitroGLYCERIN, sodium chloride flush   Vital Signs    Vitals:   08/20/21 0300 08/20/21 0400 08/20/21 0500 08/20/21 0507  BP:  (!) 100/54 100/63   Pulse:  (!) 59 62   Resp:  12 17   Temp:  98 F (36.7 C)    TempSrc:  Oral    SpO2:  94% 96%   Weight: 83.4 kg   82.9 kg  Height:        Intake/Output Summary (Last 24 hours) at 08/20/2021 0858 Last data filed at 08/19/2021 1700 Gross per 24 hour  Intake 740 ml  Output 200 ml  Net 540 ml   Last 3 Weights 08/20/2021 08/20/2021 08/19/2021  Weight (lbs) 182 lb 12.8 oz 183 lb 13.8 oz 183 lb 9.6 oz  Weight (kg) 82.918 kg 83.4 kg 83.28 kg      Telemetry    SR, some A pacing as well - Personally Reviewed  ECG    SR (some Apaced beats), QTc stable- Personally Reviewed with Dr. Curt Bears  Physical Exam   Exam is unchanged GEN: No acute distress.   Neck: No JVD Cardiac: RRR, no murmurs, rubs, or gallops.  Respiratory: CTA b/l. GI: Soft, nontender, non-distended  MS: No edema; No deformity, age appropriate atrophy Neuro:  Nonfocal  Psych: Normal affect   Labs    High Sensitivity Troponin:   Recent Labs  Lab 08/17/21 1147 08/17/21 1350  TROPONINIHS 7 6      Chemistry Recent Labs  Lab 08/18/21 0138 08/19/21 0325 08/20/21 0354  NA 138 137 138  K 4.3 4.7 4.2  CL 110 108 106  CO2 '23 24 24  '$ GLUCOSE 85 101* 90  BUN '17 15 17  '$ CREATININE 1.19 1.27* 1.18  CALCIUM 8.4* 9.0 9.3  GFRNONAA >60 56* >60  ANIONGAP '5 5 8     '$ Hematology Recent Labs  Lab 08/17/21 1147 08/19/21 0325 08/20/21 0354  WBC 4.9 6.2 4.9  RBC  3.37* 3.05* 2.87*  HGB 11.3* 10.3* 9.7*  HCT 34.1* 30.5* 29.0*  MCV 101.2* 100.0 101.0*  MCH 33.5 33.8 33.8  MCHC 33.1 33.8 33.4  RDW 22.7* 22.4* 22.5*  PLT 277 232 220    BNPNo results for input(s): BNP, PROBNP in the last 168 hours.   DDimer No results for input(s): DDIMER in the last 168 hours.   Radiology    ECHOCARDIOGRAM COMPLETE  Result Date: 08/19/2021    ECHOCARDIOGRAM REPORT   Patient Name:   VEERAJ NAEEM Date of Exam: 08/19/2021 Medical Rec #:  [id number]    Height:       75.0 in Accession #:    [id number]   Weight:       183.6 lb Date of Birth:  1936/09/02    BSA:          2.116 m Patient Age:    85 years     BP:           93/46 mmHg Patient Gender: M  HR:           69 bpm. Exam Location:  Inpatient Procedure: 2D Echo, 3D Echo, Cardiac Doppler, Color Doppler and Strain Analysis Indications:    Atrial Fibrillation I48.91  History:        Patient has prior history of Echocardiogram examinations, most                 recent 09/22/2020. Pacemaker, Arrythmias:Atrial Fibrillation,                 Signs/Symptoms:Hypotension; Risk Factors:Current Smoker. Past                 history of an ablation. Chronic kidney disease. History of                 cancer.  Sonographer:    Darlina Sicilian RDCS Referring Phys: [id number] Intercourse  1. Left ventricular ejection fraction, by estimation, is 55 to 60%. The left ventricle has normal function. The left ventricle has no regional wall motion abnormalities. There is mild left ventricular hypertrophy. Left ventricular diastolic parameters were normal.  2. Right ventricular systolic function is normal. The right ventricular size is mildly enlarged. There is normal pulmonary artery systolic pressure. The estimated right ventricular systolic pressure is 99991111 mmHg.  3. Right atrial size was mildly dilated.  4. The mitral valve is normal in structure. Trivial mitral valve regurgitation.  5. Tricuspid valve regurgitation is mild to  moderate.  6. The aortic valve is tricuspid. Aortic valve regurgitation is not visualized. No aortic stenosis is present.  7. The inferior vena cava is normal in size with greater than 50% respiratory variability, suggesting right atrial pressure of 3 mmHg. FINDINGS  Left Ventricle: Left ventricular ejection fraction, by estimation, is 55 to 60%. The left ventricle has normal function. The left ventricle has no regional wall motion abnormalities. The left ventricular internal cavity size was normal in size. There is  mild left ventricular hypertrophy. Left ventricular diastolic parameters were normal. Right Ventricle: The right ventricular size is mildly enlarged. Right vetricular wall thickness was not well visualized. Right ventricular systolic function is normal. There is normal pulmonary artery systolic pressure. The tricuspid regurgitant velocity  is 2.80 m/s, and with an assumed right atrial pressure of 3 mmHg, the estimated right ventricular systolic pressure is [id number] mmHg. Left Atrium: Left atrial size was normal in size. Right Atrium: Right atrial size was mildly dilated. Pericardium: Trivial pericardial effusion is present. Mitral Valve: The mitral valve is normal in structure. Trivial mitral valve regurgitation. Tricuspid Valve: The tricuspid valve is normal in structure. Tricuspid valve regurgitation is mild to moderate. Aortic Valve: The aortic valve is tricuspid. Aortic valve regurgitation is not visualized. No aortic stenosis is present. Pulmonic Valve: The pulmonic valve was not well visualized. Pulmonic valve regurgitation is not visualized. Aorta: The aortic root and ascending aorta are structurally normal, with no evidence of dilitation. Venous: The inferior vena cava is normal in size with greater than 50% respiratory variability, suggesting right atrial pressure of 3 mmHg. IAS/Shunts: No atrial level shunt detected by color flow Doppler.  LEFT VENTRICLE PLAX 2D LVIDd:         4.90 cm  Diastology  LVIDs:         3.50 cm  LV e' medial:    7.72 cm/s LV PW:         1.00 cm  LV E/e' medial:  11.2 LV IVS:  1.10 cm  LV e' lateral:   10.40 cm/s LVOT diam:     2.20 cm  LV E/e' lateral: 8.3 LV SV:         69 LV SV Index:   33 LVOT Area:     3.80 cm                          3D Volume EF:                         3D EF:        53 %                         LV EDV:       161 ml                         LV ESV:       75 ml                         LV SV:        86 ml RIGHT VENTRICLE RV S prime:     19.70 cm/s TAPSE (M-mode): 1.1 cm LEFT ATRIUM             Index       RIGHT ATRIUM           Index LA diam:        3.80 cm 1.80 cm/m  RA Area:     21.00 cm LA Vol (A2C):   39.6 ml 18.71 ml/m RA Volume:   63.20 ml  29.87 ml/m LA Vol (A4C):   37.8 ml 17.86 ml/m LA Biplane Vol: 40.3 ml 19.05 ml/m  AORTIC VALVE LVOT Vmax:   90.90 cm/s LVOT Vmean:  60.900 cm/s LVOT VTI:    0.182 m  AORTA Ao Root diam: 3.60 cm Ao Asc diam:  3.40 cm MITRAL VALVE               TRICUSPID VALVE MV Area (PHT): 3.50 cm    TR Peak grad:   31.4 mmHg MV Decel Time: 217 msec    TR Vmax:        280.00 cm/s MV E velocity: 86.35 cm/s                            SHUNTS                            Systemic VTI:  0.18 m                            Systemic Diam: 2.20 cm Oswaldo Milian MD Electronically signed by Oswaldo Milian MD Signature Date/Time: 08/19/2021/10:18:27 AM    Final     Cardiac Studies     07/23/20; TTE SUMMARY  Left ventricular systolic function is normal.  LV ejection fraction = 60-65%.  There is mild aortic valve sclerosis noted, with no evidence of  stenosis.  There is trace aortic regurgitation.  There is mild mitral regurgitation.  There is moderate tricuspid regurgitation.  Estimated right ventricular systolic pressure is 40 mmHg.  Mild pulmonary hypertension.  There is no comparison study available.  Myoview 09/04/2019: Impressions: 1. No reversible ischemia or infarction.  2. Normal left ventricular  wall motion.  3. Left ventricular ejection fraction 70%  4. Non invasive risk stratification*: Low   Patient Profile     85 y.o. male with a hx of persistent Afib, Atach, bronchiectasis/restrictive lung disease, symptomatic bradycardia with PPM, orthostatic hypotension, prostate cancer, CKD (III), admitted with AFib/flutter RVR and associated progressive weakness, fatigue, SOB  Assessment & Plan    Persistent AFib Dr. Minna Merritts also mentions ATach EKGs perhaps an atypical flutter vs coarse AF CHA2DS2Vasc is 2, for age, on Eliquis appropriately dosed, no missed doses in prior 3 weeks Tikosyn load is in progress K+ 4.2 Mag 2.0 Creat 1.18, stable QT stable  He has converted with drug Anticipate discharge tomorrow Cost is affordable for him, his pharmacy has 250 and 125 mcg strengths in stock as of yesterday  Dr. Curt Bears did Tikosyn teaching with the patient this AM    2. Baseline hypotension (orthostatic) BP looks OK No symptoms  3. Restrictive lung disease/bronchiectasis is described for him No SOB On no particular treatment outpt Follows with pulm  For questions or updates, please contact [place] Please consult [email or web address] for contact info under        Signed, Baldwin [place], PA-C  08/20/2021, 8:58 AM    I have seen and examined this patient with Tommye Standard.  Agree with above, note added to reflect my findings.  On exam, RRR, no murmurs. Patient remains in sinus rhythm. QTc remains table. Continue tikosyn at the current dose. Likely discharge tomorrow.    Laurell Coalson M. Lastacia Solum MD 08/20/2021 9:36 PM

## 2021-08-20 NOTE — TOC Progression Note (Signed)
Transition of Care [facility]) - Progression Note    Patient Details  Name: [name] MRN: [id number] Date of Birth: 1936/03/26  [facility]) CM/SW Contact  Zenon Mayo, RN Phone Number: 08/20/2021, 4:56 PM  Clinical Narrative:    Patient is for dc tomorrow, Steele Berg has verified that his pharmacy has it in stock at the [facility].        Expected Discharge Plan and Services                                                 Social Determinants of Health (SDOH) Interventions    Readmission Risk Interventions No flowsheet data found.

## 2021-08-20 NOTE — TOC Benefit Eligibility Note (Signed)
Patient Teacher, English as a foreign language completed.    The patient is currently admitted and upon discharge could be taking dofetilide (Tikosyn) 250 mcg.  The current 30 day co-pay is, $21.96.   The patient is insured through [place], [place] Patient Advocate Specialist Accident Team Direct Number: [phone]  Fax: [phone]

## 2021-08-21 DIAGNOSIS — I4819 Other persistent atrial fibrillation: Secondary | ICD-10-CM | POA: Diagnosis not present

## 2021-08-21 LAB — BASIC METABOLIC PANEL
Anion gap: 5 (ref 5–15)
BUN: 16 mg/dL (ref 8–23)
CO2: 24 mmol/L (ref 22–32)
Calcium: 8.6 mg/dL — ABNORMAL LOW (ref 8.9–10.3)
Chloride: 108 mmol/L (ref 98–111)
Creatinine, Ser: 1.29 mg/dL — ABNORMAL HIGH (ref 0.61–1.24)
GFR, Estimated: 55 mL/min — ABNORMAL LOW (ref >60)
Glucose, Bld: 94 mg/dL (ref 70–99)
Potassium: 4.2 mmol/L (ref 3.5–5.1)
Sodium: 137 mmol/L (ref 135–145)

## 2021-08-21 LAB — CBC
HCT: 26.4 % — ABNORMAL LOW (ref 39.0–52.0)
Hemoglobin: 9 g/dL — ABNORMAL LOW (ref 13.0–17.0)
MCH: 34 pg (ref 26.0–34.0)
MCHC: 34.1 g/dL (ref 30.0–36.0)
MCV: 99.6 fL (ref 80.0–100.0)
Platelets: 216 10*3/uL (ref 150–400)
RBC: 2.65 MIL/uL — ABNORMAL LOW (ref 4.22–5.81)
RDW: 22.5 % — ABNORMAL HIGH (ref 11.5–15.5)
WBC: 4.7 10*3/uL (ref 4.0–10.5)
nRBC: 0 % (ref 0.0–0.2)

## 2021-08-21 LAB — MAGNESIUM: Magnesium: 1.9 mg/dL (ref 1.7–2.4)

## 2021-08-21 MED ORDER — DOFETILIDE 250 MCG PO CAPS: 250.0000 ug | ORAL_CAPSULE | Freq: Two times a day (BID) | ORAL | 11 refills | Status: DC

## 2021-08-21 MED ORDER — MAGNESIUM SULFATE 2 GM/50ML IV SOLN
2.0000 g | Freq: Once | INTRAVENOUS | Status: AC
  Administered 2021-08-21: 2 g via INTRAVENOUS
  Filled 2021-08-21: qty 50

## 2021-08-21 NOTE — Progress Notes (Signed)
Feeling better less nausea Ok to discharge  Will need to figure out about dofetilide prior to discharge as his pharmacy closed at 1400h

## 2021-08-21 NOTE — Progress Notes (Signed)
Discharge explained and given to patient. Ivs removed. Tele d/c.

## 2021-08-21 NOTE — Discharge Summary (Signed)
Discharge Summary    Patient ID: Todd Salazar MRN: [id number]; DOB: 08-Mar-1936  Admit date: 08/17/2021 Discharge date: 08/21/2021  PCP:  [name], DO   [facility] Providers Cardiologist:  Dr. Minna Merritts / Dr. Curt Bears  Discharge Diagnoses    Active Problems:   CAD (coronary artery disease), native coronary artery   Anxiety   Current use of long term anticoagulation   Hypotension   Persistent atrial fibrillation [facility])   Diagnostic Studies/Procedures    Echo 08/19/21: 1. Left ventricular ejection fraction, by estimation, is 55 to 60%. The  left ventricle has normal function. The left ventricle has no regional  wall motion abnormalities. There is mild left ventricular hypertrophy.  Left ventricular diastolic parameters  were normal.   2. Right ventricular systolic function is normal. The right ventricular  size is mildly enlarged. There is normal pulmonary artery systolic  pressure. The estimated right ventricular systolic pressure is 99991111 mmHg.   3. Right atrial size was mildly dilated.   4. The mitral valve is normal in structure. Trivial mitral valve  regurgitation.   5. Tricuspid valve regurgitation is mild to moderate.   6. The aortic valve is tricuspid. Aortic valve regurgitation is not  visualized. No aortic stenosis is present.   7. The inferior vena cava is normal in size with greater than 50%  respiratory variability, suggesting right atrial pressure of 3 mmHg. _____________   History of Present Illness     Todd Salazar is a 85 y.o. 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   Todd Salazar is a 85 year old male with the above history who is followed by Dr. Minna Merritts at [facility]. Patient has  history of atrial fibrillation/flutter with difficult to control rates. Unable to tolerate rate control medications due to orthostatic hypotension. Previously on Midodrine but no longer on this.. He underwent pulmonary vein isolation by cryoablation in 2015 and states he did well for a few years after this.. Last ischemic evaluation was a Myoview in 08/2019 showed was low risk with no evidence of ischemia or infarction. Last Echo in 09/2020 showed LVEF of 60-65% with normal wall motion. More recently he has had on and off atrial flutter and atrial tachycardia and now persistent atrial fibrillation. He was previously on Flecainide but was unable to tolerate this and was changed to Propafenone. Underwent DCCV on 06/28/2021 which was initially successful; however, at follow-up with Dr. Minna Merritts on 07/09/2021 he was back in atrial fibrillation with RVR. Propafenone was increased to '300mg'$  in the morning, '150mg'$  in the afternoon, and '150mg'$  in the evening. He was prescribed Cardizem '30mg'$  three times a daily and set up for AV nodal ablation which is scheduled for 08/23/2021. Patient also has restrictive lung disease and bronchiectasis who is followed by Pulmonology.   Patient presented to the ED today for persistent tachycardia. Patient states he has tachypalpitations for the last few days that last all day. He has some associated mild chest tightness with this as well as some shortness of breath. He also reports just feeling very fatigued. He denies any chest tightness/pain outside of episodes of tachycardia. He has chronic shortness of breath due to his restrictive lung disease that is worse when he is in rapid atrial fibrillation. No orthopnea, PND, edema. He  notes some lightheadedness/dizziness with palpitations but no syncope. No recent fevers or illnesses. No cough or nasal congestion. No GI symptoms. No abnormal bleeding in urine or stools. He has not missed any doses of his Eliquis. Patient states he  is not taking Cardizem and is only taking Propafenone.   In the ED, patient tachycardic but vitals stable. EKG showed atrial fibrillation, rate 150 bpm, with incomplete RBBB, with non-specific ST/T changes. Initial high-sensitivity troponin negative. Chest x-ray showed mild left basilar atelectasis. WBC 4.9, Hgb 11.3, Plts 277. Na 139, K 4.8, Glucose 121, BUN 19, Cr 1.37. He was started on IV Cardizem and admitted. Cardiology consulted for further evaluation.  Hospital Course     Consultants: EP  Atrial fibrillation with RVR Orthostatic hypotension Rate control was limited by BP. He did not tolerate flecainide in the past and had breakthrough Afib on propafenone. He had an ablation in 2015.  He is seeking another EP opinion as he questions a second ablation. EP was consulted and initiated tikosyn load. Propafenone was stopped on admission. He converted to sinus rhythm ion tikosyn. He had nausea this morning, but this resolved and tikosyn continued.   Unfortunately, discharge will be outside of his home pharmacy hours. I have confirmed with [facility] on [address] that they have 250 mcg tikosyn on hand. I have sent 30 day supply there and he can transfer to his home pharmacy next week. Can get 90 day supply at Afib clinic next week.    Chronic anticoagulation Continue eliquis. No interruption in [place].    Restrictive lung disease NOS Weaned to room air.  Will need to follow with PCP, pulmonology.  Pt was seen and examined by Dr. Caryl Comes and felt stable for discharge.    Did the patient have an acute coronary syndrome (MI, NSTEMI, STEMI, etc) this admission?:  No                               Did the patient have a percutaneous coronary intervention (stent / angioplasty)?:  No.       _____________  Discharge Vitals Blood pressure 103/63, pulse 64, temperature 98.4 F (36.9 C), temperature source Oral, resp. rate 15, height '6\' 3"'$  (1.905 m), weight 83.2 kg, SpO2 99 %.  Filed Weights   08/20/21  0300 08/20/21 0507 08/21/21 0300  Weight: 83.4 kg 82.9 kg 83.2 kg    Labs & Radiologic Studies    CBC Recent Labs    08/20/21 0354 08/21/21 0336  WBC 4.9 4.7  HGB 9.7* 9.0*  HCT 29.0* 26.4*  MCV 101.0* 99.6  PLT 220 [id number]   Basic Metabolic Panel Recent Labs    08/20/21 0354 08/21/21 0336  NA 138 137  K 4.2 4.2  CL 106 108  CO2 24 24  GLUCOSE 90 94  BUN 17 16  CREATININE 1.18 1.29*  CALCIUM 9.3 8.6*  MG 2.0 1.9   Liver Function Tests No results for input(s): AST, ALT, ALKPHOS, BILITOT, PROT, ALBUMIN in the last 72 hours. No results for input(s): LIPASE, AMYLASE in the last 72 hours. High Sensitivity Troponin:   Recent Labs  Lab 08/17/21 1147 08/17/21 1350  TROPONINIHS 7 6    BNP Invalid input(s): POCBNP D-Dimer No results for input(s): DDIMER in the last 72 hours. Hemoglobin A1C No results for input(s): HGBA1C in the last 72 hours. Fasting Lipid Panel No results for input(s): CHOL, HDL, LDLCALC, TRIG, CHOLHDL, LDLDIRECT in the last  72 hours. Thyroid Function Tests No results for input(s): TSH, T4TOTAL, T3FREE, THYROIDAB in the last 72 hours.  Invalid input(s): FREET3 _____________  DG Chest Port 1 View  Result Date: 08/17/2021 CLINICAL DATA:  Chest pain, shortness of breath EXAM: PORTABLE CHEST 1 VIEW COMPARISON:  06/05/2021 FINDINGS: Stable positioning of a left-sided implanted cardiac device. Cardiomediastinal contours are within normal limits. Mild left basilar atelectasis. Lungs are otherwise clear. No pleural effusion or pneumothorax. IMPRESSION: Mild left basilar atelectasis. Electronically Signed   By: Davina Poke D.O.   On: 08/17/2021 12:43   ECHOCARDIOGRAM COMPLETE  Result Date: 08/19/2021    ECHOCARDIOGRAM REPORT   Patient Name:   Todd Salazar Date of Exam: 08/19/2021 Medical Rec #:  [id number]    Height:       75.0 in Accession #:    [id number]   Weight:       183.6 lb Date of Birth:  1936-11-24    BSA:          2.116 m Patient Age:    85 years      BP:           93/46 mmHg Patient Gender: M            HR:           69 bpm. Exam Location:  Inpatient Procedure: 2D Echo, 3D Echo, Cardiac Doppler, Color Doppler and Strain Analysis Indications:    Atrial Fibrillation I48.91  History:        Patient has prior history of Echocardiogram examinations, most                 recent 09/22/2020. Pacemaker, Arrythmias:Atrial Fibrillation,                 Signs/Symptoms:Hypotension; Risk Factors:Current Smoker. Past                 history of an ablation. Chronic kidney disease. History of                 cancer.  Sonographer:    Darlina Sicilian RDCS Referring Phys: [id number] [place]  1. Left ventricular ejection fraction, by estimation, is 55 to 60%. The left ventricle has normal function. The left ventricle has no regional wall motion abnormalities. There is mild left ventricular hypertrophy. Left ventricular diastolic parameters were normal.  2. Right ventricular systolic function is normal. The right ventricular size is mildly enlarged. There is normal pulmonary artery systolic pressure. The estimated right ventricular systolic pressure is 99991111 mmHg.  3. Right atrial size was mildly dilated.  4. The mitral valve is normal in structure. Trivial mitral valve regurgitation.  5. Tricuspid valve regurgitation is mild to moderate.  6. The aortic valve is tricuspid. Aortic valve regurgitation is not visualized. No aortic stenosis is present.  7. The inferior vena cava is normal in size with greater than 50% respiratory variability, suggesting right atrial pressure of 3 mmHg. FINDINGS  Left Ventricle: Left ventricular ejection fraction, by estimation, is 55 to 60%. The left ventricle has normal function. The left ventricle has no regional wall motion abnormalities. The left ventricular internal cavity size was normal in size. There is  mild left ventricular hypertrophy. Left ventricular diastolic parameters were normal. Right Ventricle: The right ventricular  size is mildly enlarged. Right vetricular wall thickness was not well visualized. Right ventricular systolic function is normal. There is normal pulmonary artery systolic pressure. The tricuspid regurgitant velocity  is 2.80 m/s, and with  an assumed right atrial pressure of 3 mmHg, the estimated right ventricular systolic pressure is 99991111 mmHg. Left Atrium: Left atrial size was normal in size. Right Atrium: Right atrial size was mildly dilated. Pericardium: Trivial pericardial effusion is present. Mitral Valve: The mitral valve is normal in structure. Trivial mitral valve regurgitation. Tricuspid Valve: The tricuspid valve is normal in structure. Tricuspid valve regurgitation is mild to moderate. Aortic Valve: The aortic valve is tricuspid. Aortic valve regurgitation is not visualized. No aortic stenosis is present. Pulmonic Valve: The pulmonic valve was not well visualized. Pulmonic valve regurgitation is not visualized. Aorta: The aortic root and ascending aorta are structurally normal, with no evidence of dilitation. Venous: The inferior vena cava is normal in size with greater than 50% respiratory variability, suggesting right atrial pressure of 3 mmHg. IAS/Shunts: No atrial level shunt detected by color flow Doppler.  LEFT VENTRICLE PLAX 2D LVIDd:         4.90 cm  Diastology LVIDs:         3.50 cm  LV e' medial:    7.72 cm/s LV PW:         1.00 cm  LV E/e' medial:  11.2 LV IVS:        1.10 cm  LV e' lateral:   10.40 cm/s LVOT diam:     2.20 cm  LV E/e' lateral: 8.3 LV SV:         69 LV SV Index:   33 LVOT Area:     3.80 cm                          3D Volume EF:                         3D EF:        53 %                         LV EDV:       161 ml                         LV ESV:       75 ml                         LV SV:        86 ml RIGHT VENTRICLE RV S prime:     19.70 cm/s TAPSE (M-mode): 1.1 cm LEFT ATRIUM             Index       RIGHT ATRIUM           Index LA diam:        3.80 cm 1.80 cm/m  RA Area:      21.00 cm LA Vol (A2C):   39.6 ml 18.71 ml/m RA Volume:   63.20 ml  29.87 ml/m LA Vol (A4C):   37.8 ml 17.86 ml/m LA Biplane Vol: 40.3 ml 19.05 ml/m  AORTIC VALVE LVOT Vmax:   90.90 cm/s LVOT Vmean:  60.900 cm/s LVOT VTI:    0.182 m  AORTA Ao Root diam: 3.60 cm Ao Asc diam:  3.40 cm MITRAL VALVE               TRICUSPID VALVE MV Area (PHT): 3.50 cm    TR Peak grad:   31.4  mmHg MV Decel Time: 217 msec    TR Vmax:        280.00 cm/s MV E velocity: 86.35 cm/s                            SHUNTS                            Systemic VTI:  0.18 m                            Systemic Diam: 2.20 cm Oswaldo Milian MD Electronically signed by Oswaldo Milian MD Signature Date/Time: 08/19/2021/10:18:27 AM    Final    Disposition   Pt is being discharged home today in good condition.  Follow-up Plans & Appointments     Follow-up Information     [facility] Follow up.   Specialty: Cardiology Why: 08/27/21 @ 11:30AM with R. Marlene Lard, PA-C Contact information: [address] [id number] mc [address] [place] [id number] [phone]        Constance Haw, MD Follow up.   Specialty: Cardiology Why: 09/13/21 @ 1:45PM Contact information: [place] [address] [phone]                Discharge Instructions     Diet - low sodium heart healthy   Complete by: As directed    Increase activity slowly   Complete by: As directed        Discharge Medications   Allergies as of 08/21/2021       Reactions   Tramadol Swelling   Laryngeal edema   Duloxetine Other (See Comments)   delusions   Gabapentin Other (See Comments)   Affects eyesight   Hydrocodone-acetaminophen Nausea And Vomiting   Prednisone Nausea And Vomiting, Other (See Comments)   confusion   Nisoldipine Other (See Comments)   Ciprofloxacin Other (See Comments)   Dizziness   Methocarbamol Nausea Only   Ondansetron Hcl Nausea Only   Sulfa  Antibiotics Rash        Medication List     STOP taking these medications    oxyCODONE 5 MG immediate release tablet Commonly known as: Oxy IR/ROXICODONE   propafenone 150 MG tablet Commonly known as: RYTHMOL       TAKE these medications    apixaban 5 MG Tabs tablet Commonly known as: ELIQUIS Take 5 mg by mouth 2 (two) times daily.   dofetilide 250 MCG capsule Commonly known as: TIKOSYN Take 1 capsule (250 mcg total) by mouth 2 (two) times daily.   nitroGLYCERIN 0.4 MG SL tablet Commonly known as: NITROSTAT Place 0.4 mg under the tongue every 5 (five) minutes as needed for chest pain.   tiZANidine 2 MG tablet Commonly known as: ZANAFLEX Take 1 tablet (2 mg total) by mouth every 8 (eight) hours as needed for muscle spasms.           Outstanding Labs/Studies   Cardiology follow up has been made.   Duration of Discharge Encounter   Greater than 30 minutes including physician time.  Signed, Tami Lin Jaxsyn Catalfamo, PA 08/21/2021, 2:42 PM

## 2021-08-21 NOTE — Progress Notes (Signed)
Progress Note  Patient Name: Todd Salazar Date of Encounter: 08/21/2021  [facility] Cardiologist: Dr. Minna Merritts  Subjective    Heart feels great but extremely nauseated this morning unable to eat. Inpatient Medications    Scheduled Meds:  apixaban  5 mg Oral BID   dofetilide  250 mcg Oral BID   sodium chloride flush  3 mL Intravenous Q12H   Continuous Infusions:  sodium chloride     PRN Meds: sodium chloride, acetaminophen, diltiazem, nitroGLYCERIN, sodium chloride flush   Vital Signs    Vitals:   08/21/21 0300 08/21/21 0420 08/21/21 0422 08/21/21 0814  BP:  (!) 93/59 (!) 93/59 103/63  Pulse:   62 64  Resp:  '15 14 15  '$ Temp:   (!) 97.5 F (36.4 C) 98.4 F (36.9 C)  TempSrc:   Oral Oral  SpO2:   96% 99%  Weight: 83.2 kg     Height:        Intake/Output Summary (Last 24 hours) at 08/21/2021 0819 Last data filed at 08/21/2021 0425 Gross per 24 hour  Intake 480 ml  Output 350 ml  Net 130 ml    Last 3 Weights 08/21/2021 08/20/2021 08/20/2021  Weight (lbs) 183 lb 6.8 oz 182 lb 12.8 oz 183 lb 13.8 oz  Weight (kg) 83.2 kg 82.918 kg 83.4 kg      Telemetry    Normal sinus rhythm  ECG    QTC less than 480 ms on last p.m. of ECG  Physical Exam   Well developed and nourished in no acute distress HENT normal Neck supple with JVP-flat Carotids brisk and full without bruits Clear Regular rate and rhythm, no murmurs or gallops Abd-soft with present but quiet BS with mild tenderness  no Clubbing cyanosis edema Skin-warm and dry A & Oriented  Grossly normal sensory and motor function   Labs    High Sensitivity Troponin:   Recent Labs  Lab 08/17/21 1147 08/17/21 1350  TROPONINIHS 7 6       Chemistry Recent Labs  Lab 08/19/21 0325 08/20/21 0354 08/21/21 0336  NA 137 138 137  K 4.7 4.2 4.2  CL 108 106 108  CO2 '24 24 24  '$ GLUCOSE 101* 90 94  BUN '15 17 16  '$ CREATININE 1.27* 1.18 1.29*  CALCIUM 9.0 9.3 8.6*  GFRNONAA 56* >60 55*  ANIONGAP '5 8 5       '$ Hematology Recent Labs  Lab 08/19/21 0325 08/20/21 0354 08/21/21 0336  WBC 6.2 4.9 4.7  RBC 3.05* 2.87* 2.65*  HGB 10.3* 9.7* 9.0*  HCT 30.5* 29.0* 26.4*  MCV 100.0 101.0* 99.6  MCH 33.8 33.8 34.0  MCHC 33.8 33.4 34.1  RDW 22.4* 22.5* 22.5*  PLT 232 220 216     BNPNo results for input(s): BNP, PROBNP in the last 168 hours.   DDimer No results for input(s): DDIMER in the last 168 hours.   Radiology    ECHOCARDIOGRAM COMPLETE  Result Date: 08/19/2021    ECHOCARDIOGRAM REPORT   Patient Name:   Todd Salazar Date of Exam: 08/19/2021 Medical Rec #:  [id number]    Height:       75.0 in Accession #:    [id number]   Weight:       183.6 lb Date of Birth:  [birth year]    BSA:          2.116 m Patient Age:    85 years     BP:  93/46 mmHg Patient Gender: M            HR:           69 bpm. Exam Location:  Inpatient Procedure: 2D Echo, 3D Echo, Cardiac Doppler, Color Doppler and Strain Analysis Indications:    Atrial Fibrillation I48.91  History:        Patient has prior history of Echocardiogram examinations, most                 recent 09/22/2020. Pacemaker, Arrythmias:Atrial Fibrillation,                 Signs/Symptoms:Hypotension; Risk Factors:Current Smoker. Past                 history of an ablation. Chronic kidney disease. History of                 cancer.  Sonographer:    Darlina Sicilian RDCS Referring Phys: [id number] [place]  1. Left ventricular ejection fraction, by estimation, is 55 to 60%. The left ventricle has normal function. The left ventricle has no regional wall motion abnormalities. There is mild left ventricular hypertrophy. Left ventricular diastolic parameters were normal.  2. Right ventricular systolic function is normal. The right ventricular size is mildly enlarged. There is normal pulmonary artery systolic pressure. The estimated right ventricular systolic pressure is 99991111 mmHg.  3. Right atrial size was mildly dilated.  4. The mitral valve is normal  in structure. Trivial mitral valve regurgitation.  5. Tricuspid valve regurgitation is mild to moderate.  6. The aortic valve is tricuspid. Aortic valve regurgitation is not visualized. No aortic stenosis is present.  7. The inferior vena cava is normal in size with greater than 50% respiratory variability, suggesting right atrial pressure of 3 mmHg. FINDINGS  Left Ventricle: Left ventricular ejection fraction, by estimation, is 55 to 60%. The left ventricle has normal function. The left ventricle has no regional wall motion abnormalities. The left ventricular internal cavity size was normal in size. There is  mild left ventricular hypertrophy. Left ventricular diastolic parameters were normal. Right Ventricle: The right ventricular size is mildly enlarged. Right vetricular wall thickness was not well visualized. Right ventricular systolic function is normal. There is normal pulmonary artery systolic pressure. The tricuspid regurgitant velocity  is 2.80 m/s, and with an assumed right atrial pressure of 3 mmHg, the estimated right ventricular systolic pressure is 99991111 mmHg. Left Atrium: Left atrial size was normal in size. Right Atrium: Right atrial size was mildly dilated. Pericardium: Trivial pericardial effusion is present. Mitral Valve: The mitral valve is normal in structure. Trivial mitral valve regurgitation. Tricuspid Valve: The tricuspid valve is normal in structure. Tricuspid valve regurgitation is mild to moderate. Aortic Valve: The aortic valve is tricuspid. Aortic valve regurgitation is not visualized. No aortic stenosis is present. Pulmonic Valve: The pulmonic valve was not well visualized. Pulmonic valve regurgitation is not visualized. Aorta: The aortic root and ascending aorta are structurally normal, with no evidence of dilitation. Venous: The inferior vena cava is normal in size with greater than 50% respiratory variability, suggesting right atrial pressure of 3 mmHg. IAS/Shunts: No atrial level  shunt detected by color flow Doppler.  LEFT VENTRICLE PLAX 2D LVIDd:         4.90 cm  Diastology LVIDs:         3.50 cm  LV e' medial:    7.72 cm/s LV PW:  1.00 cm  LV E/e' medial:  11.2 LV IVS:        1.10 cm  LV e' lateral:   10.40 cm/s LVOT diam:     2.20 cm  LV E/e' lateral: 8.3 LV SV:         69 LV SV Index:   33 LVOT Area:     3.80 cm                          3D Volume EF:                         3D EF:        53 %                         LV EDV:       161 ml                         LV ESV:       75 ml                         LV SV:        86 ml RIGHT VENTRICLE RV S prime:     19.70 cm/s TAPSE (M-mode): 1.1 cm LEFT ATRIUM             Index       RIGHT ATRIUM           Index LA diam:        3.80 cm 1.80 cm/m  RA Area:     21.00 cm LA Vol (A2C):   39.6 ml 18.71 ml/m RA Volume:   63.20 ml  29.87 ml/m LA Vol (A4C):   37.8 ml 17.86 ml/m LA Biplane Vol: 40.3 ml 19.05 ml/m  AORTIC VALVE LVOT Vmax:   90.90 cm/s LVOT Vmean:  60.900 cm/s LVOT VTI:    0.182 m  AORTA Ao Root diam: 3.60 cm Ao Asc diam:  3.40 cm MITRAL VALVE               TRICUSPID VALVE MV Area (PHT): 3.50 cm    TR Peak grad:   31.4 mmHg MV Decel Time: 217 msec    TR Vmax:        280.00 cm/s MV E velocity: 86.35 cm/s                            SHUNTS                            Systemic VTI:  0.18 m                            Systemic Diam: 2.20 cm Oswaldo Milian MD Electronically signed by Oswaldo Milian MD Signature Date/Time: 08/19/2021/10:18:27 AM    Final     Cardiac Studies     07/23/20; TTE SUMMARY  Left ventricular systolic function is normal.  LV ejection fraction = 60-65%.  There is mild aortic valve sclerosis noted, with no evidence of  stenosis.  There is trace aortic regurgitation.  There is mild mitral regurgitation.  There is moderate tricuspid regurgitation.  Estimated right ventricular systolic pressure is  40 mmHg.  Mild pulmonary hypertension.  There is no comparison study available.      Myoview  09/04/2019: Impressions: 1. No reversible ischemia or infarction.  2. Normal left ventricular wall motion.  3. Left ventricular ejection fraction 70%  4. Non invasive risk stratification*: Low   Patient Profile     85 y.o. male with a hx of persistent Afib, Atach, bronchiectasis/restrictive lung disease, symptomatic bradycardia with PPM, orthostatic hypotension, prostate cancer, CKD (III), admitted with AFib/flutter RVR and associated progressive weakness, fatigue, SOB  Assessment & Plan    Persistent AFib  Restrictive lung disease  Nausea  Anemia --probably chronic disease with elevated ferritin 8/22 and percentage saturation 36 (epic)/  Nausea can be associated with dofetilide.  We will hold his discharge for right now.  In the event that his nausea does not abate we will have to hold hold his dofetilide and see whether it is the culprit.      Signed, Virl Axe, MD  08/21/2021, 8:19 AM

## 2021-08-21 NOTE — Progress Notes (Signed)
Pharmacy: Dofetilide (Tikosyn) - Follow Up Assessment and Electrolyte Replacement  Pharmacy consulted to assist in monitoring and replacing electrolytes in this 85 y.o. male admitted on 08/17/2021 undergoing dofetilide initiation.   Labs:    Component Value Date/Time   K 4.2 08/21/2021 0336   MG 1.9 08/21/2021 0336     Plan: Potassium: K >/= 4: No additional supplementation needed  Magnesium: Mg 1.8-2: Give Mg 2 gm IV x1     Thank you for allowing pharmacy to participate in this patient's care   Hildred Laser, PharmD Clinical Pharmacist **Pharmacist phone directory can now be found on [email or web address] ([email or web address]).  Listed under [place].

## 2021-08-21 NOTE — TOC CM/SW Note (Signed)
Verified with [facility] [place] location that they have Tikosyn 250 mcg in stock. The cost will be 109.77 for 90 capsules. Made Mr. Parlette aware.   Marthenia Rolling, MSN, RN,BSN [place] Acute Care Coordinator [phone] [facility]) [phone]  (Toll free office)

## 2021-08-21 NOTE — Plan of Care (Signed)
Patient met goals. Discharging today.

## 2021-08-23 ENCOUNTER — Emergency Department (HOSPITAL_COMMUNITY): Payer: PPO

## 2021-08-23 ENCOUNTER — Telehealth (HOSPITAL_COMMUNITY): Payer: Self-pay | Admitting: *Deleted

## 2021-08-23 ENCOUNTER — Emergency Department (HOSPITAL_COMMUNITY)
Admission: EM | Admit: 2021-08-23 | Discharge: 2021-08-23 | Disposition: A | Discharge status: Home / Self Care | Payer: PPO | Attending: Emergency Medicine | Admitting: Emergency Medicine

## 2021-08-23 DIAGNOSIS — I4891 Unspecified atrial fibrillation: Secondary | ICD-10-CM | POA: Diagnosis not present

## 2021-08-23 DIAGNOSIS — I4819 Other persistent atrial fibrillation: Secondary | ICD-10-CM | POA: Diagnosis not present

## 2021-08-23 DIAGNOSIS — I480 Paroxysmal atrial fibrillation: Secondary | ICD-10-CM | POA: Insufficient documentation

## 2021-08-23 DIAGNOSIS — Z7901 Long term (current) use of anticoagulants: Secondary | ICD-10-CM | POA: Diagnosis not present

## 2021-08-23 DIAGNOSIS — I495 Sick sinus syndrome: Secondary | ICD-10-CM

## 2021-08-23 DIAGNOSIS — Z8546 Personal history of malignant neoplasm of prostate: Secondary | ICD-10-CM | POA: Insufficient documentation

## 2021-08-23 DIAGNOSIS — R0789 Other chest pain: Secondary | ICD-10-CM | POA: Diagnosis not present

## 2021-08-23 DIAGNOSIS — Z96643 Presence of artificial hip joint, bilateral: Secondary | ICD-10-CM | POA: Diagnosis not present

## 2021-08-23 DIAGNOSIS — R002 Palpitations: Secondary | ICD-10-CM | POA: Diagnosis not present

## 2021-08-23 DIAGNOSIS — I2510 Atherosclerotic heart disease of native coronary artery without angina pectoris: Secondary | ICD-10-CM | POA: Diagnosis not present

## 2021-08-23 DIAGNOSIS — R079 Chest pain, unspecified: Secondary | ICD-10-CM

## 2021-08-23 DIAGNOSIS — I119 Hypertensive heart disease without heart failure: Secondary | ICD-10-CM | POA: Diagnosis not present

## 2021-08-23 DIAGNOSIS — R0602 Shortness of breath: Secondary | ICD-10-CM | POA: Diagnosis not present

## 2021-08-23 LAB — CBC WITH DIFFERENTIAL/PLATELET
Abs Immature Granulocytes: 0.01 10*3/uL (ref 0.00–0.07)
Basophils Absolute: 0 10*3/uL (ref 0.0–0.1)
Basophils Relative: 1 %
Eosinophils Absolute: 0 10*3/uL (ref 0.0–0.5)
Eosinophils Relative: 1 %
HCT: 25.9 % — ABNORMAL LOW (ref 39.0–52.0)
Hemoglobin: 8.8 g/dL — ABNORMAL LOW (ref 13.0–17.0)
Immature Granulocytes: 0 %
Lymphocytes Relative: 25 %
Lymphs Abs: 1 10*3/uL (ref 0.7–4.0)
MCH: 34.5 pg — ABNORMAL HIGH (ref 26.0–34.0)
MCHC: 34 g/dL (ref 30.0–36.0)
MCV: 101.6 fL — ABNORMAL HIGH (ref 80.0–100.0)
Monocytes Absolute: 0.6 10*3/uL (ref 0.1–1.0)
Monocytes Relative: 14 %
Neutro Abs: 2.4 10*3/uL (ref 1.7–7.7)
Neutrophils Relative %: 59 %
Platelets: 199 10*3/uL (ref 150–400)
RBC: 2.55 MIL/uL — ABNORMAL LOW (ref 4.22–5.81)
RDW: 22.6 % — ABNORMAL HIGH (ref 11.5–15.5)
WBC: 4.1 10*3/uL (ref 4.0–10.5)
nRBC: 0 % (ref 0.0–0.2)

## 2021-08-23 LAB — COMPREHENSIVE METABOLIC PANEL
ALT: 28 U/L (ref 0–44)
AST: 31 U/L (ref 15–41)
Albumin: 4.3 g/dL (ref 3.5–5.0)
Alkaline Phosphatase: 80 U/L (ref 38–126)
Anion gap: 5 (ref 5–15)
BUN: 18 mg/dL (ref 8–23)
CO2: 23 mmol/L (ref 22–32)
Calcium: 9.3 mg/dL (ref 8.9–10.3)
Chloride: 111 mmol/L (ref 98–111)
Creatinine, Ser: 1.21 mg/dL (ref 0.61–1.24)
GFR, Estimated: 59 mL/min — ABNORMAL LOW (ref >60)
Glucose, Bld: 97 mg/dL (ref 70–99)
Potassium: 6 mmol/L — ABNORMAL HIGH (ref 3.5–5.1)
Sodium: 139 mmol/L (ref 135–145)
Total Bilirubin: 2.4 mg/dL — ABNORMAL HIGH (ref 0.3–1.2)
Total Protein: 7 g/dL (ref 6.5–8.1)

## 2021-08-23 LAB — POTASSIUM: Potassium: 4.8 mmol/L (ref 3.5–5.1)

## 2021-08-23 LAB — MAGNESIUM: Magnesium: 2.1 mg/dL (ref 1.7–2.4)

## 2021-08-23 LAB — TROPONIN I (HIGH SENSITIVITY)
Troponin I (High Sensitivity): 9 ng/L (ref <18)
Troponin I (High Sensitivity): 8 ng/L (ref <18)

## 2021-08-23 MED ORDER — PROPOFOL 10 MG/ML IV BOLUS
40.0000 mg | Freq: Once | INTRAVENOUS | Status: DC
Start: 2021-08-23 — End: 2021-08-23
  Filled 2021-08-23: qty 20

## 2021-08-23 MED ORDER — LACTATED RINGERS IV BOLUS
1000.0000 mL | Freq: Once | INTRAVENOUS | Status: AC
  Administered 2021-08-23: 1000 mL via INTRAVENOUS

## 2021-08-23 MED ORDER — ACETAMINOPHEN 500 MG PO TABS
1000.0000 mg | ORAL_TABLET | Freq: Once | ORAL | Status: AC
  Administered 2021-08-23: 1000 mg via ORAL
  Filled 2021-08-23: qty 2

## 2021-08-23 MED ORDER — PROPOFOL 10 MG/ML IV BOLUS: 0.5000 mg/kg | Freq: Once | INTRAVENOUS | Status: DC

## 2021-08-23 NOTE — ED Provider Notes (Addendum)
[facility] EMERGENCY DEPARTMENT Provider Note   CSN: [id number] Arrival date & time: 08/23/21  0931     History Chief Complaint  Patient presents with   Chest Pain    Todd Salazar is a 85 y.o. male.  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  Patient did take his granddaughter to school this morning but said he did not think he was going to be able to make it home.  When family member arrived he was diaphoretic, pale, tachypneic with pulse rate between 130-150.  He denies any nausea or vomiting today but continues to complain of pressure in his chest.  He also still feels short of breath throughout the evaluation.  He continues to take his Eliquis denies any unilateral pain or swelling in his legs.  Has taken his medication as prescribed since being home.  Denies any cough or congestion.  No fevers.  In the past family member reports he is always had palpitations and shortness of breath but is never complained of chest pressure before and this seems different.  He has no prior history of CAD.  No prior stents.  Last echo showed an EF of 55 to 60%.  Spoke with cardiology who instructed them to come to the emergency room.  The history is provided by the patient, a relative and medical records.  Chest Pain     Past Medical History:  Diagnosis Date   Arthritis    Atrial fibrillation ([place])    Cancer [facility])    prostate   Dysrhythmia    afib   History of kidney stones     Patient Active Problem List   Diagnosis Date Noted    Persistent atrial fibrillation ([place]) 08/17/2021   S/P lumbar fusion 11/25/2019   Carcinoma of prostate ([place]) 09/07/2019   Lumbar radiculopathy 09/07/2019   Degeneration of lumbar intervertebral disc 09/07/2019   History of total replacement of both hip joints 08/01/2019   Iliotibial band syndrome of right side 07/27/2019   Macrocytic anemia 07/26/2019   Hypotension 07/26/2019   Bronchiectasis without complication ([place]) [id number]   DOE (dyspnea on exertion) 10/19/2018   Lung nodule < 6cm on CT 10/09/2018   Occipital neuritis 08/29/2016   Bilateral hearing loss 06/22/2016   Post herpetic neuralgia 02/02/2016   Current use of long term anticoagulation 12/18/2015   Calcification of spleen 09/13/2014   Diverticulosis 09/13/2014   Bilateral renal cysts 09/13/2014   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   Hemangioma of liver 09/13/2014   Male erectile dysfunction, unspecified 09/13/2014   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   Paraphimosis 09/13/2014   Polyp of colon 09/13/2014   Restless legs syndrome 09/13/2014   Spondylosis without myelopathy or radiculopathy, cervical region 09/13/2014   Urethral stricture 09/13/2014   Spondylosis without myelopathy or radiculopathy, lumbosacral region 09/13/2014   Anxiety 09/13/2014   Chest pain at rest 12/11/2011    Class: Acute   CAD (coronary artery disease), native coronary artery 12/11/2011    Class: Chronic   HTN (hypertension) 12/11/2011   Paroxysmal atrial fibrillation ([place]) 12/11/2011    Past Surgical History:  Procedure Laterality Date   Bone spur     CHOLECYSTECTOMY     HERNIA REPAIR     JOINT REPLACEMENT     kidney stones     LAMINECTOMY WITH POSTERIOR LATERAL ARTHRODESIS LEVEL 1 Right 11/25/2019   Procedure: Laminectomy and Foraminotomy  Lumbar Four-Lumbar Five - right, with instrumented fusion;  Surgeon: Eustace Moore, MD;  Location: [place];  Service: Neurosurgery;  Laterality: Right;  Laminectomy and Foraminotomy  Lumbar  Four-Lumbar Five - right, with instrumented fusion   PROSTATE SURGERY         Family History  Problem Relation Age of Onset   CAD Neg Hx    Heart failure Neg Hx    Stroke Neg Hx     Social History   Tobacco Use   Smoking status: Former    Types: Cigarettes    Quit date: 12/10/1996    Years since quitting: 24.7   Smokeless tobacco: Never  Vaping Use   Vaping Use: Never used  Substance Use Topics   Alcohol use: No   Drug use: No    Home Medications Prior to Admission medications   Medication Sig Start Date End Date Taking? Authorizing Provider  apixaban (ELIQUIS) 5 MG TABS tablet Take 5 mg by mouth 2 (two) times daily.    [provider]  dofetilide (TIKOSYN) 250 MCG capsule Take 1 capsule (250 mcg total) by mouth 2 (two) times daily. 08/21/21   [facility], Tami Lin, PA  nitroGLYCERIN (NITROSTAT) 0.4 MG SL tablet Place 0.4 mg under the tongue every 5 (five) minutes as needed for chest pain.  07/25/19   [provider]  tiZANidine (ZANAFLEX) 2 MG tablet Take 1 tablet (2 mg total) by mouth every 8 (eight) hours as needed for muscle spasms. Patient not taking: Reported on 08/17/2021 11/26/19   Eustace Moore, MD    Allergies    Tramadol, Duloxetine, Gabapentin, Hydrocodone-acetaminophen, Prednisone, Nisoldipine, Ciprofloxacin, Methocarbamol, Ondansetron hcl, and Sulfa antibiotics  Review of Systems   Review of Systems  Cardiovascular:  Positive for chest pain.  All other systems reviewed and are negative.  Physical Exam Updated Vital Signs BP 131/81 (BP Location: Left Arm)   Pulse (!) 117   Temp 97.7 F (36.5 C) (Oral)   Resp 18   SpO2 98%   Physical Exam Vitals and nursing note reviewed.  Constitutional:      General: He is not in acute distress.    Appearance: He is well-developed. He is ill-appearing and diaphoretic.  HENT:     Head: Normocephalic and atraumatic.     Mouth/Throat:     Mouth: Mucous membranes are moist.  Eyes:      Conjunctiva/sclera: Conjunctivae normal.     Pupils: Pupils are equal, round, and reactive to light.  Cardiovascular:     Rate and Rhythm: Tachycardia present. Rhythm irregularly irregular.     Heart sounds: No murmur heard.    Comments: Pacemaker present in the left upper chest Pulmonary:     Effort: Pulmonary effort is normal. No respiratory distress.     Breath sounds: Normal breath sounds. No wheezing or rales.  Abdominal:     General: There is no distension.     Palpations: Abdomen is soft.     Tenderness: There is no abdominal tenderness. There is no guarding or rebound.  Musculoskeletal:        General: No tenderness. Normal range of motion.     Cervical back: Normal range of motion and  neck supple.     Right lower leg: No edema.     Left lower leg: No edema.  Skin:    General: Skin is warm.     Coloration: Skin is pale.     Findings: No erythema or rash.  Neurological:     Mental Status: He is alert and oriented to person, place, and time. Mental status is at baseline.  Psychiatric:        Mood and Affect: Mood normal.        Behavior: Behavior normal.    ED Results / Procedures / Treatments   Labs (all labs ordered are listed, but only abnormal results are displayed) Labs Reviewed  CBC WITH DIFFERENTIAL/PLATELET  COMPREHENSIVE METABOLIC PANEL  MAGNESIUM  TROPONIN I (HIGH SENSITIVITY)    EKG EKG Interpretation  Date/Time:  Monday August 23 2021 09:55:49 EDT Ventricular Rate:  126 PR Interval:    QRS Duration: 116 QT Interval:  357 QTC Calculation: 517 R Axis:   43 Text Interpretation: Atrial flutter with predominant 2:1 AV block Incomplete right bundle branch block Confirmed by Blanchie Dessert [phone]) on 08/23/2021 10:17:45 AM  Radiology No results found.  Procedures Procedures   Medications Ordered in ED Medications - No data to display  ED Course  I have reviewed the triage vital signs and the nursing notes.  Pertinent labs & imaging  results that were available during my care of the patient were reviewed by me and considered in my medical decision making (see chart for details).    MDM Rules/Calculators/A&P                           Patient with a significant cardiac history presenting today with chest tightness, shortness of breath and palpitations.  Patient is back in A. fib despite being on Tikosyn and recent discharge from the hospital 2 days ago.  Heart rate ranges between 130-105 but he is still complaining of chest discomfort.  Patient has had significant issues with hypotension in the past and hesitant to give any nitroglycerin due to his prior issues with hypotension.  He does not appear fluid overloaded at this time.  No symptoms of infectious etiology.  We will recheck labs, consult cardiology for further recommendations.  EKG today shows atrial flutter with incomplete right bundle branch block.  No pacemaker fires at this time T wave inversion V2 and V3 new from prior.  10:55 AM Pt still having pain but SOB has resolved.  Hr still in the low 100's.  BP in the low 100's and upper 90's and pt given a fluid bolus but hx of hypotension.  Pain is not pleuritic but he reports it comes and goes.  Pt will not tolerate NTG due to bp and has significant N/V with narcotics so at this time will not give pain meds.  Pt is currently on eliquis so ASA not ideal.  1:02 PM Pt remains in afib with rvr rate between 99-120 and continues to have chest pain.  Still waiting cardiology recommendation.  BP improved after fluids to low 100's.  Labs reassuring with unchanged CMP except for K of 6 which was hemolyzed and normal mag.  CBC with Hb of 8.8 which was 9 during hospitalization.  Initial trop wnl.  Low suspicion for PE, dissection or infectious etiology.  1:58 PM Repeat potassium is normal at 4.8 and delta troponin is 8.  Patient is feeling better but remains in atrial fibrillation.  He was seen by Dr. Quentin Ore with cardiology service  who recommended cardioversion.  If he is back in sinus rhythm and feeling better then he feels that he is stable for discharge home.  This was discussed with the patient and his family member and they are happy with this plan.  Discussed propofol sedation and cardioversion.  Discussed the risk.  2:35 PM Getting ready to cardiovert when pt spontaneously went back into a sinus rhythm.  He has taken his tikosyn today and not due for another dose till this evening.  Dr. Quentin Ore did not want to change any medications.  Will monitor pt for another 30-34mn and ensure he can ambulate without return of sx and d/c home.   3:04 PM Patient is still having some mild pain in his chest which is better from when he arrived but has negative delta troponins, recurrent EKGs did not show any signs of ischemia.  Spoke with Dr. LQuentin Orewith cardiology service and they do not feel like this is cardiac at this time.  No reason to suspect PE as patient is anticoagulated.  Low suspicion for acute infectious etiology.  He has no abdominal pain at this time.  He was given Tylenol for the pain.  Will ensure patient is able to ambulate prior to discharge.  He has cardiology follow-up on Friday.  He will continue to take his Tikosyn.   MDM   Amount and/or Complexity of Data Reviewed Clinical lab tests: ordered and reviewed Tests in the radiology section of CPT: ordered and reviewed Tests in the medicine section of CPT: reviewed and ordered Independent visualization of images, tracings, or specimens: yes      Final Clinical Impression(s) / ED Diagnoses Final diagnoses:  Atrial fibrillation with rapid ventricular response ([facility]    Rx / DC Orders ED Discharge Orders     None        PBlanchie Dessert MD 08/23/21 1304    PBlanchie Dessert MD 08/23/21 1507

## 2021-08-23 NOTE — Telephone Encounter (Signed)
Patient daughter, Rip Harbour, called into clinic stating her father called this morning not feeling well. She states he is "shaking all over", clammy and short of breath. States this is what he feels like when his BP drops too low and in afib. HR this morning 98-134 BP 114/74. Took his dofetilide at 7am. Daughter very anxious states she is taking him to the [facility] asap.

## 2021-08-23 NOTE — ED Notes (Signed)
Pt verbalizes understanding of discharge instructions. Opportunity for questions and answers were provided. Pt discharged from the ED.   ?

## 2021-08-23 NOTE — ED Triage Notes (Signed)
Pt arrived POV c/o chest heaviness and weakness.  Pt was recently d/ced for afib.  Pt feels short of breath and lightheaded.  HR up to 125 on 12 lead up to 190 on monitor  Pt has pacemaker

## 2021-08-23 NOTE — ED Notes (Signed)
ED RN, RT, MD, pharmacy at bedside preparing for cardioversion. Pt spontaneously self-converted to NSR. EKG given to [facility], MD. Pt resting at this time.

## 2021-08-23 NOTE — ED Notes (Signed)
Pt ambulatory without difficulty. Steady gait. Denies CP/SOB.

## 2021-08-23 NOTE — ED Provider Notes (Signed)
Emergency Medicine Provider Triage Evaluation Note  Todd Salazar , a 85 y.o. male  was evaluated in triage.  Pt complains of chest pain, lightheadedness, palpitations since this morning.  History of A. fib anticoagulated on Eliquis, was admitted last week..  Review of Systems  Positive: Lightheadedness, chest pain, palpitations, shortness of breath Negative: Syncope, fevers, chills  Physical Exam  There were no vitals taken for this visit. Gen:   Awake, no distress   Resp:  Normal effort  MSK:   Moves extremities without difficulty  Other:  Irregular rhythm with tachycardia with heart rate in the 140s.  Concerning for A. fib with RVR.  Lungs CTA B.  Patient neurovascular intact in all 4 extremities.  Medical Decision Making  Medically screening exam initiated at 9:50 AM.  Appropriate orders placed.  Todd Salazar was informed that the remainder of the evaluation will be completed by another provider, this initial triage assessment does not replace that evaluation, and the importance of remaining in the ED until their evaluation is complete.  Patient transported directly to room 22 due to A. fib with RVR.  This chart was dictated using voice recognition software, Dragon. Despite the best efforts of this provider to proofread and correct errors, errors may still occur which can change documentation meaning.    Emeline Darling, PA-C 08/23/21 [place], [place] A, DO 08/23/21 1803

## 2021-08-23 NOTE — ED Notes (Signed)
Got patient undressed on the monitor got patient vitals patient is resting with call bell in reach and family at bedside

## 2021-08-23 NOTE — Discharge Instructions (Signed)
Blood work looked good today except for your anemia that will need to be follow by your doctor.  You went back into a normal rhythm on your own.  Unclear what is causing your pain but does not appear to be related to the chest.  Continue taking your tikosyn however if symptoms return or you start feeing worse return to the ER.

## 2021-08-23 NOTE — Consult Note (Signed)
Cardiology Consultation:   Patient ID: Todd Salazar MRN: [id number]; DOB: [birth year]  Admit date: 08/23/2021 Date of Consult: 08/23/2021  PCP:  [name], DO   [place] Providers Cardiologist:  Dr. Minna Merritts New to [facility]: Dr. Curt Bears   Patient Profile:   Todd Salazar is a 85 y.o. male with a hx of persistent Afib, Atach, bronchiectasis/restrictive lung disease, symptomatic bradycardia with PPM, orthostatic hypotension, prostate cancer, CKD (III) who is being seen 08/23/2021 for the evaluation of Afib w/RVR at the request of Dr. Maryan Rued.  History of Present Illness:   Todd Salazar was just discharged over the weekend after a hospital stay with the same, recurrent Afib/flutter with RVR. NOted last hospital stay he had been following regularly with Dr. Minna Merritts and planned for [facility] ablation though the pt uncomfortable with his heart completely reliant in Pacer and came to [facility] with RVR. He had previously been trued and failed sotalol, flecainide, and propafenone Started on Tikosyn. He converted with drug and was discharged on Satuday in SR/A paced rhythm.  He did well yesterday and woke feeling OK though felt sudden onset of tachy palpitations, this though different in that it made him feel clammy and more weak then usual with a component of chest heaviness as well.  He has been given some IVF/LR BPs 110's/70's currently and HR 90's-110s and feeling much improved.  K+ 6.0 (hemolized) > 4,8 Mag 2.1 BUN/Creat 18/1.21 HS Trop 9, 8 WBC 4.1 H/H 8.8/25.9 Plts 199     Past Medical History:  Diagnosis Date   Arthritis    Atrial fibrillation (HCC)    Cancer (HCC)    prostate   Dysrhythmia    afib   History of kidney stones     Past Surgical History:  Procedure Laterality Date   Bone spur     CHOLECYSTECTOMY     HERNIA REPAIR     JOINT REPLACEMENT     kidney stones     LAMINECTOMY WITH POSTERIOR LATERAL ARTHRODESIS LEVEL 1 Right 11/25/2019   Procedure: Laminectomy  and Foraminotomy  Lumbar Four-Lumbar Five - right, with instrumented fusion;  Surgeon: Eustace Moore, MD;  Location: [place];  Service: Neurosurgery;  Laterality: Right;  Laminectomy and Foraminotomy  Lumbar Four-Lumbar Five - right, with instrumented fusion   PROSTATE SURGERY       Home Medications:  Prior to Admission medications   Medication Sig Start Date End Date Taking? Authorizing Provider  apixaban (ELIQUIS) 5 MG TABS tablet Take 5 mg by mouth 2 (two) times daily.    [provider]  dofetilide (TIKOSYN) 250 MCG capsule Take 1 capsule (250 mcg total) by mouth 2 (two) times daily. 08/21/21   [facility], Tami Lin, PA  nitroGLYCERIN (NITROSTAT) 0.4 MG SL tablet Place 0.4 mg under the tongue every 5 (five) minutes as needed for chest pain.  07/25/19   [provider]  tiZANidine (ZANAFLEX) 2 MG tablet Take 1 tablet (2 mg total) by mouth every 8 (eight) hours as needed for muscle spasms. Patient not taking: Reported on 08/17/2021 11/26/19   Eustace Moore, MD    Inpatient Medications: Scheduled Meds:  propofol  40 mg Intravenous Once   Continuous Infusions:  PRN Meds:   Allergies:    Allergies  Allergen Reactions   Tramadol Swelling    Laryngeal edema   Duloxetine Other (See Comments)    delusions   Gabapentin Other (See Comments)    Affects eyesight   Hydrocodone-Acetaminophen Nausea And Vomiting  Prednisone Nausea And Vomiting and Other (See Comments)    confusion   Nisoldipine Other (See Comments)   Ciprofloxacin Other (See Comments)    Dizziness    Methocarbamol Nausea Only   Ondansetron Hcl Nausea Only   Sulfa Antibiotics Rash    Social History:   Social History   Socioeconomic History   Marital status: Unknown    Spouse name: Not on file   Number of children: Not on file   Years of education: Not on file   Highest education level: Not on file  Occupational History   Not on file  Tobacco Use   Smoking status: Former    Types: Cigarettes     Quit date: 12/10/1996    Years since quitting: 24.7   Smokeless tobacco: Never  Vaping Use   Vaping Use: Never used  Substance and Sexual Activity   Alcohol use: No   Drug use: No   Sexual activity: Never  Other Topics Concern   Not on file  Social History Narrative   Not on file   Social Determinants of Health   Financial Resource Strain: Not on file  Food Insecurity: Not on file  Transportation Needs: Not on file  Physical Activity: Not on file  Stress: Not on file  Social Connections: Not on file  Intimate Partner Violence: Not on file    Family History:   Family History  Problem Relation Age of Onset   CAD Neg Hx    Heart failure Neg Hx    Stroke Neg Hx      ROS:  Please see the history of present illness.  All other ROS reviewed and negative.     Physical Exam/Data:   Vitals:   08/23/21 1300 08/23/21 1315 08/23/21 1330 08/23/21 1415  BP: 100/64 110/78 100/60 116/81  Pulse: 98 (!) 103 (!) 123 (!) 114  Resp: '15 17 16 17  '$ Temp:      TempSrc:      SpO2: 96% 97% 98% 99%   No intake or output data in the 24 hours ending 08/23/21 1433 Last 3 Weights 08/21/2021 08/20/2021 08/20/2021  Weight (lbs) 183 lb 6.8 oz 182 lb 12.8 oz 183 lb 13.8 oz  Weight (kg) 83.2 kg 82.918 kg 83.4 kg     There is no height or weight on file to calculate BMI.  General:  Well nourished, well developed, in no acute distress HEENT: normal Lymph: no adenopathy Neck: no JVD Endocrine:  No thryomegaly Vascular: No carotid bruits; FA pulses 2+ bilaterally without bruits  Cardiac:  irreg-irreg,; no murmurs, gallops or rubs Lungs:  CTA b/l, no wheezing, rhonchi or rales  Abd: soft, nontender, no hepatomegaly  Ext: no edema Musculoskeletal:  No deformities Skin: warm and dry  Neuro:  no focal abnormalities noted Psych:  Normal affect   EKG:  The EKG was personally reviewed and demonstrates:   AFib/atypical Aflutter 1125bpm, RBBB  Telemetry:  Telemetry was personally reviewed and  demonstrates:   AFib 90's-110s  Relevant CV Studies:  07/23/20; TTE SUMMARY  Left ventricular systolic function is normal.  LV ejection fraction = 60-65%.  There is mild aortic valve sclerosis noted, with no evidence of  stenosis.  There is trace aortic regurgitation.  There is mild mitral regurgitation.  There is moderate tricuspid regurgitation.  Estimated right ventricular systolic pressure is 40 mmHg.  Mild pulmonary hypertension.  There is no comparison study available.      Myoview 09/04/2019: Impressions: 1. No reversible ischemia or  infarction.  2. Normal left ventricular wall motion.  3. Left ventricular ejection fraction 70%  4. Non invasive risk stratification*: Low    Laboratory Data:  High Sensitivity Troponin:   Recent Labs  Lab 08/17/21 1147 08/17/21 1350 08/23/21 1002 08/23/21 1202  TROPONINIHS '7 6 9 8     '$ Chemistry Recent Labs  Lab 08/20/21 0354 08/21/21 0336 08/23/21 1002 08/23/21 1202  NA 138 137 139  --   K 4.2 4.2 6.0* 4.8  CL 106 108 111  --   CO2 '24 24 23  '$ --   GLUCOSE 90 94 97  --   BUN '17 16 18  '$ --   CREATININE 1.18 1.29* 1.21  --   CALCIUM 9.3 8.6* 9.3  --   GFRNONAA >60 55* 59*  --   ANIONGAP '8 5 5  '$ --     Recent Labs  Lab 08/23/21 1002  PROT 7.0  ALBUMIN 4.3  AST 31  ALT 28  ALKPHOS 80  BILITOT 2.4*   Hematology Recent Labs  Lab 08/20/21 0354 08/21/21 0336 08/23/21 1112  WBC 4.9 4.7 4.1  RBC 2.87* 2.65* 2.55*  HGB 9.7* 9.0* 8.8*  HCT 29.0* 26.4* 25.9*  MCV 101.0* 99.6 101.6*  MCH 33.8 34.0 34.5*  MCHC 33.4 34.1 34.0  RDW 22.5* 22.5* 22.6*  PLT 220 216 199   BNPNo results for input(s): BNP, PROBNP in the last 168 hours.  DDimer No results for input(s): DDIMER in the last 168 hours.   Radiology/Studies:  DG Chest Port 1 View  Result Date: 08/23/2021 CLINICAL DATA:  85 year old male with chest pain, shortness of breath and palpitations. EXAM: PORTABLE CHEST 1 VIEW COMPARISON:  Portable chest 08/17/2021 and  earlier. FINDINGS: Portable AP semi upright view at at 1027 hours. Stable large lung volumes and mediastinal contours. Stable left chest dual lead cardiac pacemaker. Visualized tracheal air column is within normal limits. Stable lung markings. Allowing for portable technique the lungs are clear. No pneumothorax. No acute osseous abnormality identified. IMPRESSION: No acute cardiopulmonary abnormality. Electronically Signed   By: Genevie Ann M.D.   On: 08/23/2021 10:45     Assessment and Plan:   Persistent AFib Dr. Minna Merritts also mentions ATach CHA2DS2Vasc is 2, for age, on Eliquis appropriately dosed Did very well post discharge until this AM  He confirms no missed Eliquis doses   Dr. Quentin Ore discussed options DCCV and monitor closely in early days/weeks with restoration of SR/Tikosyn and if again with ERAF would need to pursue AV node ablation or Admit and pursue AVNode ablation  The ptient and daughter (son in law on the phone) are most comfortable with trying DCCV keep his Afib clinic visit Friday and go from there  If he has ERAF again will need to see Dr. Curt Bears or Dr. Minna Merritts whichever he prefers for AV node ablation arrangements  They would prefer to DCCV in the ER, with close outpt follow up Note that historically his BP has been intolerant of nodal blocking agents   2. CP HS Trop neg No hx of CAD Low risk stress test 2019 Likely 2/2 RVR   3. anemia Deferred to PMD Discussed with pt/daughter       Risk Assessment/Risk Scores:    For questions or updates, please contact [place] Please consult [email or web address] for contact info under    Signed, Baldwin [place], PA-C  08/23/2021 2:33 PM

## 2021-08-23 NOTE — ED Provider Notes (Signed)
Patient care signed out to follow-up ambulation test.  Patient ambulated with nurse without difficulty.  Patient remained in sinus rhythm.  Patient stable for outpatient follow-up, vitals reviewed and normal at discharge.  Golda Acre, MD 08/23/21 [phone]

## 2021-08-23 NOTE — ED Notes (Signed)
Consent obtained for procedure.

## 2021-08-27 ENCOUNTER — Other Ambulatory Visit: Payer: Self-pay

## 2021-08-27 ENCOUNTER — Ambulatory Visit (HOSPITAL_COMMUNITY)
Admission: RE | Admit: 2021-08-27 | Discharge: 2021-08-27 | Disposition: A | Discharge status: Home / Self Care | Payer: PPO | Source: Ambulatory Visit | Attending: Physician Assistant | Admitting: Physician Assistant

## 2021-08-27 ENCOUNTER — Encounter (HOSPITAL_COMMUNITY): Payer: Self-pay | Admitting: Physician Assistant

## 2021-08-27 VITALS — BP 114/62 | HR 66 | Ht 75.0 in | Wt 187.6 lb

## 2021-08-27 DIAGNOSIS — Z882 Allergy status to sulfonamides status: Secondary | ICD-10-CM | POA: Diagnosis not present

## 2021-08-27 DIAGNOSIS — Z950 Presence of cardiac pacemaker: Secondary | ICD-10-CM | POA: Diagnosis not present

## 2021-08-27 DIAGNOSIS — N189 Chronic kidney disease, unspecified: Secondary | ICD-10-CM | POA: Insufficient documentation

## 2021-08-27 DIAGNOSIS — Z7901 Long term (current) use of anticoagulants: Secondary | ICD-10-CM | POA: Diagnosis not present

## 2021-08-27 DIAGNOSIS — Z888 Allergy status to other drugs, medicaments and biological substances status: Secondary | ICD-10-CM | POA: Insufficient documentation

## 2021-08-27 DIAGNOSIS — Z8546 Personal history of malignant neoplasm of prostate: Secondary | ICD-10-CM | POA: Insufficient documentation

## 2021-08-27 DIAGNOSIS — D6869 Other thrombophilia: Secondary | ICD-10-CM | POA: Diagnosis not present

## 2021-08-27 DIAGNOSIS — I4819 Other persistent atrial fibrillation: Secondary | ICD-10-CM | POA: Insufficient documentation

## 2021-08-27 DIAGNOSIS — Z881 Allergy status to other antibiotic agents status: Secondary | ICD-10-CM | POA: Diagnosis not present

## 2021-08-27 DIAGNOSIS — Z79899 Other long term (current) drug therapy: Secondary | ICD-10-CM | POA: Diagnosis not present

## 2021-08-27 DIAGNOSIS — Z87891 Personal history of nicotine dependence: Secondary | ICD-10-CM | POA: Insufficient documentation

## 2021-08-27 DIAGNOSIS — Z885 Allergy status to narcotic agent status: Secondary | ICD-10-CM | POA: Diagnosis not present

## 2021-08-27 LAB — CBC
HCT: 31 % — ABNORMAL LOW (ref 39.0–52.0)
Hemoglobin: 10.3 g/dL — ABNORMAL LOW (ref 13.0–17.0)
MCH: 34.2 pg — ABNORMAL HIGH (ref 26.0–34.0)
MCHC: 33.2 g/dL (ref 30.0–36.0)
MCV: 103 fL — ABNORMAL HIGH (ref 80.0–100.0)
Platelets: 245 10*3/uL (ref 150–400)
RBC: 3.01 MIL/uL — ABNORMAL LOW (ref 4.22–5.81)
RDW: 23.5 % — ABNORMAL HIGH (ref 11.5–15.5)
WBC: 4.9 10*3/uL (ref 4.0–10.5)
nRBC: 0.4 % — ABNORMAL HIGH (ref 0.0–0.2)

## 2021-08-27 LAB — BASIC METABOLIC PANEL
Anion gap: 6 (ref 5–15)
BUN: 17 mg/dL (ref 8–23)
CO2: 24 mmol/L (ref 22–32)
Calcium: 9 mg/dL (ref 8.9–10.3)
Chloride: 111 mmol/L (ref 98–111)
Creatinine, Ser: 1.35 mg/dL — ABNORMAL HIGH (ref 0.61–1.24)
GFR, Estimated: 52 mL/min — ABNORMAL LOW (ref >60)
Glucose, Bld: 103 mg/dL — ABNORMAL HIGH (ref 70–99)
Potassium: 5.3 mmol/L — ABNORMAL HIGH (ref 3.5–5.1)
Sodium: 141 mmol/L (ref 135–145)

## 2021-08-27 LAB — MAGNESIUM: Magnesium: 1.9 mg/dL (ref 1.7–2.4)

## 2021-08-27 NOTE — Progress Notes (Signed)
Primary Care Physician: [name], DO Primary Cardiologist: Dr Minna Merritts Primary Electrophysiologist: Dr Curt Bears  Referring Physician: Dr Guido Sander Todd Salazar is a 85 y.o. male with a history of bronchiectasis/restrictive lung disease, symptomatic bradycardia s/p PPM, orthostatic hypotension, prostate cancer, CKD, atrial tachycardia, atrial fibrillation who presents for follow up in the [facility]. Patient is on Eliquis for a CHADS2VASC score of 2. Patient recently hospitalized for afib with RVR and loaded on dofetilide. He converted with the medication and did not require DCCV. However, he presented to the ED 08/23/21 with  rapid afib. He was about to undergo DCCV when he spontaneously converted to SR. Unfortunately, he has had more episodes of heart racing in the interim. There are no specific triggers that he can identify.   Today, he denies symptoms of chest pain, shortness of breath, orthopnea, PND, lower extremity edema, dizziness, presyncope, syncope, snoring, daytime somnolence, bleeding, or neurologic sequela. The patient is tolerating medications without difficulties and is otherwise without complaint today.    Atrial Fibrillation Risk Factors:  he does not have symptoms or diagnosis of sleep apnea. he does not have a history of rheumatic fever.   he has a BMI of Body mass index is 23.45 kg/m.Marland Kitchen Filed Weights   08/27/21 1131  Weight: 85.1 kg    Family History  Problem Relation Age of Onset   CAD Neg Hx    Heart failure Neg Hx    Stroke Neg Hx      Atrial Fibrillation Management history:  Previous antiarrhythmic drugs: flecainide, sotalol, propafenone  Previous cardioversions: none Previous ablations: none CHADS2VASC score: 2 Anticoagulation history: Eliquis   Past Medical History:  Diagnosis Date   Arthritis    Atrial fibrillation (HCC)    Cancer (HCC)    prostate   Dysrhythmia    afib   History of kidney stones    Past  Surgical History:  Procedure Laterality Date   Bone spur     CHOLECYSTECTOMY     HERNIA REPAIR     JOINT REPLACEMENT     kidney stones     LAMINECTOMY WITH POSTERIOR LATERAL ARTHRODESIS LEVEL 1 Right 11/25/2019   Procedure: Laminectomy and Foraminotomy  Lumbar Four-Lumbar Five - right, with instrumented fusion;  Surgeon: Eustace Moore, MD;  Location: [facility];  Service: Neurosurgery;  Laterality: Right;  Laminectomy and Foraminotomy  Lumbar Four-Lumbar Five - right, with instrumented fusion   PROSTATE SURGERY      Current Outpatient Medications  Medication Sig Dispense Refill   apixaban (ELIQUIS) 5 MG TABS tablet Take 5 mg by mouth 2 (two) times daily.     dofetilide (TIKOSYN) 250 MCG capsule Take 1 capsule (250 mcg total) by mouth 2 (two) times daily. 90 capsule 11   nitroGLYCERIN (NITROSTAT) 0.4 MG SL tablet Place 0.4 mg under the tongue every 5 (five) minutes as needed for chest pain.      No current facility-administered medications for this encounter.    Allergies  Allergen Reactions   Ciprofloxacin Anaphylaxis    Dizziness    Tramadol Swelling    Laryngeal edema   Duloxetine Other (See Comments)    delusions   Gabapentin Other (See Comments)    Affects eyesight   Hydrocodone-Acetaminophen Nausea And Vomiting   Prednisone Nausea And Vomiting and Other (See Comments)    confusion   Benadryl [Diphenhydramine] Other (See Comments)    Panic attack    Nisoldipine Other (See Comments)  Methocarbamol Nausea Only   Ondansetron Hcl Nausea Only   Sulfa Antibiotics Rash    Social History   Socioeconomic History   Marital status: Unknown    Spouse name: Not on file   Number of children: Not on file   Years of education: Not on file   Highest education level: Not on file  Occupational History   Not on file  Tobacco Use   Smoking status: Former    Types: Cigarettes    Quit date: 12/10/1996    Years since quitting: 24.7   Smokeless tobacco: Never   Tobacco comments:     Former smoker (08/27/2021)  Vaping Use   Vaping Use: Never used  Substance and Sexual Activity   Alcohol use: No   Drug use: No   Sexual activity: Never  Other Topics Concern   Not on file  Social History Narrative   Not on file   Social Determinants of Health   Financial Resource Strain: Not on file  Food Insecurity: Not on file  Transportation Needs: Not on file  Physical Activity: Not on file  Stress: Not on file  Social Connections: Not on file  Intimate Partner Violence: Not on file     ROS- All systems are reviewed and negative except as per the HPI above.  Physical Exam: Vitals:   08/27/21 1131  BP: 114/62  Pulse: 66  Weight: 85.1 kg  Height: '6\' 3"'$  (1.905 m)    GEN- The patient is a well appearing elderly male, alert and oriented x 3 today.   Head- normocephalic, atraumatic Eyes-  Sclera clear, conjunctiva pink Ears- hearing intact Oropharynx- clear Neck- supple  Lungs- Clear to ausculation bilaterally, normal work of breathing Heart- Regular rate and rhythm, no murmurs, rubs or gallops  GI- soft, NT, ND, + BS Extremities- no clubbing, cyanosis, or edema MS- no significant deformity or atrophy Skin- no rash or lesion Psych- euthymic mood, full affect Neuro- strength and sensation are intact  Wt Readings from Last 3 Encounters:  08/27/21 85.1 kg  08/21/21 83.2 kg  11/25/19 81.6 kg    EKG today demonstrates  SR, PAC Vent. rate 66 BPM PR interval 200 ms QRS duration 96 ms QT/QTcB 452/473 ms  Echo 08/19/21 demonstrated  1. Left ventricular ejection fraction, by estimation, is 55 to 60%. The  left ventricle has normal function. The left ventricle has no regional  wall motion abnormalities. There is mild left ventricular hypertrophy.  Left ventricular diastolic parameters  were normal.   2. Right ventricular systolic function is normal. The right ventricular  size is mildly enlarged. There is normal pulmonary artery systolic  pressure. The  estimated right ventricular systolic pressure is 99991111 mmHg.   3. Right atrial size was mildly dilated.   4. The mitral valve is normal in structure. Trivial mitral valve  regurgitation.   5. Tricuspid valve regurgitation is mild to moderate.   6. The aortic valve is tricuspid. Aortic valve regurgitation is not  visualized. No aortic stenosis is present.   7. The inferior vena cava is normal in size with greater than 50%  respiratory variability, suggesting right atrial pressure of 3 mmHg.   Epic records are reviewed at length today  CHA2DS2-VASc Score = 2  The patient's score is based upon: CHF History: 0 HTN History: 0 Diabetes History: 0 Stroke History: 0 Vascular Disease History: 0 Age Score: 2 Gender Score: 0       ASSESSMENT AND PLAN: 1. Persistent Atrial Fibrillation (ICD10:  I48.19) The patient's CHA2DS2-VASc score is 2, indicating a 2.2% annual risk of stroke.   S/p dofetilide loading with frequent symptomatic breakthrough episodes of afib. Recall he has not tolerated rate control in the past due to hypotension. We discussed options today. He is agreeable to discussing possible AV node ablation with Dr Curt Bears.  Continue dofetilide 250 mcg BID for now. QT stable. Continue Eliquis 5 mg BID  2. Secondary Hypercoagulable State (ICD10:  D68.69) The patient is at significant risk for stroke/thromboembolism based upon his CHA2DS2-VASc Score of 2.  Continue Apixaban (Eliquis).    Follow up with Dr Curt Bears at next available appointment.    [facility] [address] [place], [place] [address] [phone] 08/27/2021 12:47 PM

## 2021-08-31 ENCOUNTER — Encounter: Payer: Self-pay | Admitting: Cardiology

## 2021-08-31 ENCOUNTER — Other Ambulatory Visit: Payer: Self-pay

## 2021-08-31 ENCOUNTER — Ambulatory Visit: Payer: PPO | Admitting: Cardiology

## 2021-08-31 VITALS — BP 122/70 | HR 77 | Ht 75.0 in | Wt 186.0 lb

## 2021-08-31 DIAGNOSIS — Z01812 Encounter for preprocedural laboratory examination: Secondary | ICD-10-CM

## 2021-08-31 DIAGNOSIS — I4819 Other persistent atrial fibrillation: Secondary | ICD-10-CM

## 2021-08-31 DIAGNOSIS — Z01818 Encounter for other preprocedural examination: Secondary | ICD-10-CM

## 2021-08-31 MED ORDER — AMIODARONE HCL 200 MG PO TABS: 200.0000 mg | ORAL_TABLET | Freq: Every day | ORAL | 3 refills | Status: DC

## 2021-08-31 MED ORDER — AMIODARONE HCL 200 MG PO TABS: 200.0000 mg | ORAL_TABLET | Freq: Every day | ORAL | 0 refills | Status: DC

## 2021-08-31 NOTE — Progress Notes (Signed)
Electrophysiology Office Note   Date:  09/01/2021   ID:  Todd Salazar, DOB 10/24/1936, MRN [id number]  PCP:  [name], DO  Cardiologist:   Primary Electrophysiologist:  Havana Baldwin Meredith Leeds, MD    Chief Complaint: AF   History of Present Illness: Todd Salazar is a 85 y.o. male who is being seen today for the evaluation of AF at the request of [name], DO. Presenting today for electrophysiology evaluation.  He has a history significant for atrial fibrillation, atrial tachycardia, bronchiectasis with restrictive lung disease, symptomatic bradycardia status post [facility] pacemaker, orthostatic hypotension, prostate cancer, CKD stage III.  He presented to the hospital September 2022 and atrial fibrillation.  He is now status post dofetilide load.  Today, he denies symptoms of palpitations, chest pain, shortness of breath, orthopnea, PND, lower extremity edema, claudication, dizziness, presyncope, syncope, bleeding, or neurologic sequela. The patient is tolerating medications without difficulties.  Unfortunately since his hospital stay, he has continued to have episodes of intermittent atrial fibrillation.  He has episodes of fatigue, palpitations, and shortness of breath associated with his episodes.  He feels quite poorly.  Has also been having some side effects "just off."   Past Medical History:  Diagnosis Date   Arthritis    Atrial fibrillation ([place])    Cancer ([place])    prostate   Dysrhythmia    afib   History of kidney stones    Past Surgical History:  Procedure Laterality Date   Bone spur     CHOLECYSTECTOMY     HERNIA REPAIR     JOINT REPLACEMENT     kidney stones     LAMINECTOMY WITH POSTERIOR LATERAL ARTHRODESIS LEVEL 1 Right 11/25/2019   Procedure: Laminectomy and Foraminotomy  Lumbar Four-Lumbar Five - right, with instrumented fusion;  Surgeon: Eustace Moore, MD;  Location: [facility];  Service: Neurosurgery;  Laterality: Right;  Laminectomy and Foraminotomy   Lumbar Four-Lumbar Five - right, with instrumented fusion   PROSTATE SURGERY       Current Outpatient Medications  Medication Sig Dispense Refill   amiodarone (PACERONE) 200 MG tablet Take 1 tablet (200 mg total) by mouth daily. 84 tablet 0   amiodarone (PACERONE) 200 MG tablet Take 1 tablet (200 mg total) by mouth daily. 30 tablet 3   apixaban (ELIQUIS) 5 MG TABS tablet Take 5 mg by mouth 2 (two) times daily.     nitroGLYCERIN (NITROSTAT) 0.4 MG SL tablet Place 0.4 mg under the tongue every 5 (five) minutes as needed for chest pain.      No current facility-administered medications for this visit.    Allergies:   Ciprofloxacin, Tramadol, Duloxetine, Gabapentin, Hydrocodone-acetaminophen, Prednisone, Benadryl [diphenhydramine], Nisoldipine, Methocarbamol, Ondansetron hcl, and Sulfa antibiotics   Social History:  The patient  reports that he quit smoking about 24 years ago. His smoking use included cigarettes. He has never used smokeless tobacco. He reports that he does not drink alcohol and does not use drugs.   Family History:  The patient's family history includes Hypertension in his father.    ROS:  Please see the history of present illness.   Otherwise, review of systems is positive for none.   All other systems are reviewed and negative.    PHYSICAL EXAM: VS:  BP 122/70   Pulse 77   Ht 6\' 3"  (1.905 m)   Wt 186 lb (84.4 kg)   SpO2 96%   BMI 23.25 kg/m  , BMI Body mass index is  23.25 kg/m. GEN: Well nourished, well developed, in no acute distress  HEENT: normal  Neck: no JVD, carotid bruits, or masses Cardiac: RRR; no murmurs, rubs, or gallops,no edema  Respiratory:  clear to auscultation bilaterally, normal work of breathing GI: soft, nontender, nondistended, + BS MS: no deformity or atrophy  Skin: warm and dry, device pocket is well healed Neuro:  Strength and sensation are intact Psych: euthymic mood, full affect  EKG:  EKG is ordered today. Personal review of the  ekg ordered shows sinus rhythm rate 77  Device interrogation is reviewed today in detail.  See PaceArt for details.   Recent Labs: 08/17/2021: TSH 1.677 08/23/2021: ALT 28 08/27/2021: BUN 17; Creatinine, Ser 1.35; Hemoglobin 10.3; Magnesium 1.9; Platelets 245; Potassium 5.3; Sodium 141    Lipid Panel     Component Value Date/Time   CHOL 174 12/11/2011 0500   TRIG 123 12/11/2011 0500   HDL 29 (L) 12/11/2011 0500   CHOLHDL 6.0 12/11/2011 0500   VLDL 25 12/11/2011 0500   LDLCALC 120 (H) 12/11/2011 0500     Wt Readings from Last 3 Encounters:  08/31/21 186 lb (84.4 kg)  08/27/21 187 lb 9.6 oz (85.1 kg)  08/21/21 183 lb 6.8 oz (83.2 kg)      Other studies Reviewed: Additional studies/ records that were reviewed today include: TTE 08/19/21  Review of the above records today demonstrates:   1. Left ventricular ejection fraction, by estimation, is 55 to 60%. The  left ventricle has normal function. The left ventricle has no regional  wall motion abnormalities. There is mild left ventricular hypertrophy.  Left ventricular diastolic parameters  were normal.   2. Right ventricular systolic function is normal. The right ventricular  size is mildly enlarged. There is normal pulmonary artery systolic  pressure. The estimated right ventricular systolic pressure is 74.2 mmHg.   3. Right atrial size was mildly dilated.   4. The mitral valve is normal in structure. Trivial mitral valve  regurgitation.   5. Tricuspid valve regurgitation is mild to moderate.   6. The aortic valve is tricuspid. Aortic valve regurgitation is not  visualized. No aortic stenosis is present.   7. The inferior vena cava is normal in size with greater than 50%  respiratory variability, suggesting right atrial pressure of 3 mmHg.    ASSESSMENT AND PLAN:  1.  Persistent atrial fibrillation/atrial flutter: CHA2DS2-VASc of 2.  Currently on Eliquis 5 mg twice daily and dofetilide 250 mcg twice daily.  High risk  medication monitoring via ECG.  Unfortunately has continued to have episodes of atrial fibrillation.  He has a 75% burden based on device interrogation.  He feels quite poorly when he is in atrial fibrillation.  I spoke with him about the possibility of either AV node ablation or PVI.  He would like 1 more attempt at sinus rhythm with PVI.  She states that if this does not work, AV node ablation would be a reasonable alternative.  We Murlean Seelye stop his dofetilide today and we Tajha Sammarco load him on amiodarone.  We Orva Gwaltney plan for atrial fibrillation ablation.  We Janell Keeling likely stop amiodarone post ablation.  Risk, benefits, and alternatives to EP study and radiofrequency ablation for afib were also discussed in detail today. These risks include but are not limited to stroke, bleeding, vascular damage, tamponade, perforation, damage to the esophagus, lungs, and other structures, pulmonary vein stenosis, worsening renal function, and death. The patient understands these risk and wishes to proceed.  We  Andreia Gandolfi therefore proceed with catheter ablation at the next available time.  Carto, ICE, anesthesia are requested for the procedure.  Chelcee Korpi also obtain CT PV protocol prior to the procedure to exclude LAA thrombus and further evaluate atrial anatomy.   2.  Restrictive lung disease: Patient without shortness of breath.  Attempting to avoid amiodarone.  3.  Symptomatic bradycardia: Status post [facility] Jude dual-chamber pacemaker.  Device functioning appropriately.  No changes at this time.   Current medicines are reviewed at length with the patient today.   The patient does not have concerns regarding his medicines.  The following changes were made today:  none  Labs/ tests ordered today include:  Orders Placed This Encounter  Procedures   CT CARDIAC MORPH/PULM VEIN W/CM&W/O CA SCORE   Basic metabolic panel   CBC   EKG 12-Lead      Disposition:   FU with Kindsey Eblin 3 months  Signed, Tej Murdaugh Meredith Leeds, MD   09/01/2021 7:47 AM     [address] [place] [address] [phone] (office) [phone] (fax)

## 2021-08-31 NOTE — Patient Instructions (Addendum)
Medication Instructions:  Your physician has recommended you make the following change in your medication: START Amiodarone  - take 2 tablets (400 mg total) TWICE a day for 2 weeks, then  - take 1 tablet (200 mg total) TWICE a day for 2 weeks, then  - take 1 tablet (200 mg total) ONCE a day   *If you need a refill on your cardiac medications before your next appointment, please call your pharmacy*   Lab Work:between 10/24 - 11/04:  BMP & CBC  If you have labs (blood work) drawn today and your tests are completely normal, you will receive your results only by: [place] (if you have MyChart) OR A paper copy in the mail If you have any lab test that is abnormal or we need to change your treatment, we will call you to review the results.   Testing/Procedures: Your physician has requested that you have cardiac CT within 7 days PRIOR to your ablation. Cardiac computed tomography (CT) is a painless test that uses an x-ray machine to take clear, detailed pictures of your heart.  Please follow instruction below located under "other instructions". You will get a call from our office to schedule the date for this test.  Your physician has recommended that you have an ablation. Catheter ablation is a medical procedure used to treat some cardiac arrhythmias (irregular heartbeats). During catheter ablation, a long, thin, flexible tube is put into a blood vessel in your groin (upper thigh), or neck. This tube is called an ablation catheter. It is then guided to your heart through the blood vessel. Radio frequency waves destroy small areas of heart tissue where abnormal heartbeats may cause an arrhythmia to start. Please follow instruction below located under "other instructions".   Follow-Up: At [facility], you and your health needs are our priority.  As part of our continuing mission to provide you with exceptional heart care, we have created designated Provider Care Teams.  These Care Teams  include your primary Cardiologist (physician) and Advanced Practice Providers (APPs -  Physician Assistants and Nurse Practitioners) who all work together to provide you with the care you need, when you need it.  We recommend signing up for the patient portal called "MyChart".  Sign up information is provided on this After Visit Summary.  MyChart is used to connect with patients for Virtual Visits (Telemedicine).  Patients are able to view lab/test results, encounter notes, upcoming appointments, etc.  Non-urgent messages can be sent to your provider as well.   To learn more about what you can do with MyChart, go to [email or web address].    Your next appointment:   1 month(s) after your ablation  The format for your next appointment:   In Person  Provider:   AFib clinic   Thank you for choosing [facility]!!   Trinidad Curet, RN [phone]    Other Instructions   CT INSTRUCTIONS Your cardiac CT will be scheduled at:  [facility] [address] [place], [place] [address] [phone]  Please arrive at the [facility] main entrance (entrance A) of [facility] 30 minutes prior to test start time. Proceed to the [facility] Radiology Department (first floor) to check-in and test prep.  Please follow these instructions carefully (unless otherwise directed):  Hold all erectile dysfunction medications at least 3 days (72 hrs) prior to test.  On the Night Before the Test: Be sure to Drink plenty of water. Do not consume any caffeinated/decaffeinated beverages  or chocolate 12 hours prior to your test. Do not take any antihistamines 12 hours prior to your test.   On the Day of the Test: Drink plenty of water until 1 hour prior to the test. Do not eat any food 4 hours prior to the test. You may take your regular medications prior to the test.  HOLD Furosemide/Hydrochlorothiazide morning of the test.       After the Test: Drink plenty of  water. After receiving IV contrast, you may experience a mild flushed feeling. This is normal. On occasion, you may experience a mild rash up to 24 hours after the test. This is not dangerous. If this occurs, you can take Benadryl 25 mg and increase your fluid intake. If you experience trouble breathing, this can be serious. If it is severe call 911 IMMEDIATELY. If it is mild, please call our office. If you take any of these medications: Glipizide/Metformin, Avandament, Glucavance, please do not take 48 hours after completing test unless otherwise instructed.   Once we have confirmed authorization from your insurance company, we will call you to set up a date and time for your test. Based on how quickly your insurance processes prior authorizations requests, please allow up to 4 weeks to be contacted for scheduling your Cardiac CT appointment. Be advised that routine Cardiac CT appointments could be scheduled as many as 8 weeks after your provider has ordered it.  For non-scheduling related questions, please contact the cardiac imaging nurse navigator should you have any questions/concerns: Marchia Bond, Cardiac Imaging Nurse Navigator Gordy Clement, Cardiac Imaging Nurse Navigator [facility] Direct Office Dial: [phone]   For scheduling needs, including cancellations and rescheduling, please call [place], [phone].     Electrophysiology/Ablation Procedure Instructions   You are scheduled for a(n)  ablation on 10/29/2021 with Dr. Allegra Lai.   1.   Pre procedure testing-             A.  LAB WORK --- On between  10/24 - 11/04   for your pre procedure blood work.  You do NOT need to be fasting.  You can stop by the [facility].     On the day of your procedure 10/29/2021 you will go to [facility] [phone]) at 5:30 am.  Dennis Bast will go to the main entrance A [employer]) and enter where the [employer] are.  Your  driver will drop you off and you will head down the hallway to ADMITTING.  You may have one support person come in to the hospital with you.  They will be asked to wait in the waiting room. It is OK to have someone drop you off and come back when you are ready to be discharged.   3.   Do not eat or drink after midnight prior to your procedure.   4.   On the morning of your procedure do NOT take any medication. Do not miss any doses of your blood thinner prior to the morning of your procedure or your procedure will need to be rescheduled.   5.  Plan for an overnight stay but you may be discharged after your procedure, if you use your phone frequently bring your phone charger. If you are discharged after your procedure you will need someone to drive you home and be with you for 24 hours after your procedure.   6. You will follow up with the AFIB clinic 4  weeks after your procedure.  You will follow up with Dr. Curt Bears  3 months after your procedure.  These appointments will be made for you.   7. FYI: For your safety, and to allow [place] to monitor your vital signs accurately during the surgery/procedure we request that if you have artificial nails, gel coating, SNS etc. Please have those removed prior to your surgery/procedure. Not having the nail coverings /polish removed may result in cancellation or delay of your surgery/procedure.  * If you have ANY questions please call the office [phone] and ask for Boston Cookson RN or send me a MyChart message   * Occasionally, EP Studies and ablations can become lengthy.  Please make your family aware of this before your procedure starts.  Average time ranges from 2-8 hours for EP studies/ablations.  Your physician will call your family after the procedure with the results.                                    Cardiac Ablation Cardiac ablation is a procedure to destroy (ablate) some heart tissue that is sending bad signals. These bad signals cause problems in  heart rhythm. The heart has many areas that make these signals. If there are problems in these areas, they can make the heart beat in a way that is not normal. Destroying some tissues can help make the heart rhythm normal. Tell your doctor about: Any allergies you have. All medicines you are taking. These include vitamins, herbs, eye drops, creams, and over-the-counter medicines. Any problems you or family members have had with medicines that make you fall asleep (anesthetics). Any blood disorders you have. Any surgeries you have had. Any medical conditions you have, such as kidney failure. Whether you are pregnant or may be pregnant. What are the risks? This is a safe procedure. But problems may occur, including: Infection. Bruising and bleeding. Bleeding into the chest. Stroke or blood clots. Damage to nearby areas of your body. Allergies to medicines or dyes. The need for a pacemaker if the normal system is damaged. Failure of the procedure to treat the problem. What happens before the procedure? Medicines Ask your doctor about: Changing or stopping your normal medicines. This is important. Taking aspirin and ibuprofen. Do not take these medicines unless your doctor tells you to take them. Taking other medicines, vitamins, herbs, and supplements. General instructions Follow instructions from your doctor about what you cannot eat or drink. Plan to have someone take you home from the hospital or clinic. If you will be going home right after the procedure, plan to have someone with you for 24 hours. Ask your doctor what steps will be taken to prevent infection. What happens during the procedure?  An IV tube will be put into one of your veins. You will be given a medicine to help you relax. The skin on your neck or groin will be numbed. A cut (incision) will be made in your neck or groin. A needle will be put through your cut and into a large vein. A tube (catheter) will be put  into the needle. The tube will be moved to your heart. Dye may be put through the tube. This helps your doctor see your heart. Small devices (electrodes) on the tube will send out signals. A type of energy will be used to destroy some heart tissue. The tube will be taken out. Pressure will be held  on your cut. This helps stop bleeding. A bandage will be put over your cut. The exact procedure may vary among doctors and hospitals. What happens after the procedure? You will be watched until you leave the hospital or clinic. This includes checking your heart rate, breathing rate, oxygen, and blood pressure. Your cut will be watched for bleeding. You will need to lie still for a few hours. Do not drive for 24 hours or as long as your doctor tells you. Summary Cardiac ablation is a procedure to destroy some heart tissue. This is done to treat heart rhythm problems. Tell your doctor about any medical conditions you may have. Tell him or her about all medicines you are taking to treat them. This is a safe procedure. But problems may occur. These include infection, bruising, bleeding, and damage to nearby areas of your body. Follow what your doctor tells you about food and drink. You may also be told to change or stop some of your medicines. After the procedure, do not drive for 24 hours or as long as your doctor tells you. This information is not intended to replace advice given to you by your health care provider. Make sure you discuss any questions you have with your health care provider. Document Revised: 10/31/2019 Document Reviewed: 10/31/2019 Elsevier Patient Education  2022 [place].   Amiodarone Tablets What is this medication? AMIODARONE (a MEE oh da rone) prevents and treats a fast or irregular heartbeat (arrhythmia). It works by slowing down overactive electric signals in the heart, which stabilizes your heart rhythm. It belongs to a group of medications called antiarrhythmics. This  medicine may be used for other purposes; ask your health care provider or pharmacist if you have questions. COMMON BRAND NAME(S): Cordarone, Pacerone What should I tell my care team before I take this medication? They need to know if you have any of these conditions: Liver disease Lung disease Other heart problems Thyroid disease An unusual or allergic reaction to amiodarone, iodine, other medications, foods, dyes, or preservatives Pregnant or trying to get pregnant Breast-feeding How should I use this medication? Take this medication by mouth with a glass of water. Follow the directions on the prescription label. You can take this medication with or without food. However, you should always take it the same way each time. Take your doses at regular intervals. Do not take your medication more often than directed. Do not stop taking except on the advice of your care team. A special MedGuide will be given to you by the pharmacist with each prescription and refill. Be sure to read this information carefully each time. Talk to your care team regarding the use of this medication in children. Special care may be needed. Overdosage: If you think you have taken too much of this medicine contact a poison control center or emergency room at once. NOTE: This medicine is only for you. Do not share this medicine with others. What if I miss a dose? If you miss a dose, take it as soon as you can. If it is almost time for your next dose, take only that dose. Do not take double or extra doses. What may interact with this medication? Do not take this medication with any of the following: Abarelix Apomorphine Arsenic trioxide Certain antibiotics like erythromycin, gemifloxacin, levofloxacin, pentamidine Certain medications for depression like amoxapine, tricyclic antidepressants Certain medications for fungal infections like fluconazole, itraconazole, ketoconazole, posaconazole, voriconazole Certain  medications for irregular heartbeat like disopyramide, dronedarone, ibutilide, propafenone, sotalol  Certain medications for malaria like chloroquine, halofantrine Cisapride Droperidol Haloperidol [place] Maprotiline Methadone Phenothiazines like chlorpromazine, mesoridazine, thioridazine Pimozide Ranolazine Red yeast rice Vardenafil This medication may also interact with the following: Antiviral medications for HIV or AIDS Certain medications for blood pressure, heart disease, irregular heart beat Certain medications for cholesterol like atorvastatin, cerivastatin, lovastatin, simvastatin Certain medications for hepatitis C like sofosbuvir and ledipasvir; sofosbuvir Certain medications for seizures like phenytoin Certain medications for thyroid problems Certain medications that treat or prevent blood clots like warfarin Cholestyramine Cimetidine Clopidogrel Cyclosporine Dextromethorphan Diuretics Dofetilide Fentanyl General anesthetics Grapefruit juice Lidocaine Loratadine Methotrexate Other medications that prolong the QT interval (cause an abnormal heart rhythm) Procainamide Quinidine Rifabutin, rifampin, or rifapentine St. John's Wort Trazodone Ziprasidone This list may not describe all possible interactions. Give your health care provider a list of all the medicines, herbs, non-prescription drugs, or dietary supplements you use. Also tell them if you smoke, drink alcohol, or use illegal drugs. Some items may interact with your medicine. What should I watch for while using this medication? Your condition will be monitored closely when you first begin therapy. Often, this medication is first started in a hospital or other monitored health care setting. Once you are on maintenance therapy, visit your care team for regular checks on your progress. Because your condition and use of this medication carry some risk, it is a good idea to carry an identification card, necklace  or bracelet with details of your condition, medications, and care team. You may get drowsy or dizzy. Do not drive, use machinery, or do anything that needs mental alertness until you know how this medication affects you. Do not stand or sit up quickly, especially if you are an older patient. This reduces the risk of dizzy or fainting spells. This medication can make you more sensitive to the sun. Keep out of the sun. If you cannot avoid being in the sun, wear protective clothing and use sunscreen. Do not use sun lamps or tanning beds/booths. You should have regular eye exams before and during treatment. Call your care team if you have blurred vision, see halos, or your eyes become sensitive to light. Your eyes may get dry. It may be helpful to use a lubricating eye solution or artificial tears solution. If you are going to have surgery or a procedure that requires contrast dyes, tell your care team that you are taking this medication. What side effects may I notice from receiving this medication? Side effects that you should report to your care team as soon as possible: Allergic reactions-skin rash, itching, hives, swelling of the face, lips, tongue, or throat Bluish-gray skin Change in vision such as blurry vision, seeing halos around lights, vision loss Heart failure-shortness of breath, swelling of the ankles, feet, or hands, sudden weight gain, unusual weakness or fatigue Heart rhythm changes-fast or irregular heartbeat, dizziness, feeling faint or lightheaded, chest pain, trouble breathing High thyroid levels (hyperthyroidism)-fast or irregular heartbeat, weight loss, excessive sweating or sensitivity to heat, tremors or shaking, anxiety, nervousness, irregular menstrual cycle or spotting Liver injury-right upper belly pain, loss of appetite, nausea, light-colored stool, dark yellow or brown urine, yellowing skin or eyes, unusual weakness or fatigue Low thyroid levels (hypothyroidism)-unusual  weakness or fatigue, sensitivity to cold, constipation, hair loss, dry skin, weight gain, feelings of depression Lung injury-shortness of breath or trouble breathing, cough, spitting up blood, chest pain, fever Pain, tingling, or numbness in the hands or feet, muscle weakness, trouble walking, loss of balance or  coordination Side effects that usually do not require medical attention (report to your care team if they continue or are bothersome): Nausea Vomiting This list may not describe all possible side effects. Call your doctor for medical advice about side effects. You may report side effects to FDA at 1-800-FDA-1088. Where should I keep my medication? Keep out of the reach of children and pets. Store at room temperature between 20 and 25 degrees C (68 and 77 degrees F). Protect from light. Keep container tightly closed. Throw away any unused medication after the expiration date. NOTE: This sheet is a summary. It may not cover all possible information. If you have questions about this medicine, talk to your doctor, pharmacist, or health care provider.  2022 Elsevier/Gold Standard (2020-12-31 11:04:04)

## 2021-09-01 ENCOUNTER — Encounter: Payer: Self-pay | Admitting: Cardiology

## 2021-09-01 ENCOUNTER — Telehealth: Payer: Self-pay | Admitting: Cardiology

## 2021-09-01 MED ORDER — AMIODARONE HCL 200 MG PO TABS: ORAL_TABLET | ORAL | 0 refills | Status: DC

## 2021-09-01 NOTE — Telephone Encounter (Signed)
    Pt c/o medication issue:  1. Name of Medication: amiodarone (PACERONE) 200 MG tablet  2. How are you currently taking this medication (dosage and times per day)? As directed  3. Are you having a reaction (difficulty breathing--STAT)?   4. What is your medication issue? Daughter in [place] of the patient wanted to speak to Sherri to make sure the rx are written correctly so that the patient's insurance will cover the medication.

## 2021-09-01 NOTE — Telephone Encounter (Signed)
Called pt daughter in-law and informed her that I could not speak with her since her name is not on DPR.  I spoke with pt and he gave verbal consent for me to speak with daughter in law Todd Salazar.  Daughter in law expresses that amiodarone dose was not ordered correctly and pt only received #30 200 mg tablets. Instructions per AVS:  START Amiodarone            - take 2 tablets (400 mg total) TWICE a day for 2 weeks, then            - take 1 tablet (200 mg total) TWICE a day for 2 weeks, then            - take 1 tablet (200 mg total) ONCE a day  I called [facility] and spoke with [place].  He informed me that he would adjust medication count to ensure pt gets amount needed.  I have reordered med per AVS instructions.  I called and informed daughter in law that order has been updated.  No further questions or concerns at this time.

## 2021-09-01 NOTE — Telephone Encounter (Signed)
Patient's daughter in law states she is returning a call from [place].

## 2021-09-09 ENCOUNTER — Telehealth: Payer: Self-pay | Admitting: Cardiology

## 2021-09-09 ENCOUNTER — Ambulatory Visit (INDEPENDENT_AMBULATORY_CARE_PROVIDER_SITE_OTHER): Payer: PPO

## 2021-09-09 DIAGNOSIS — I4819 Other persistent atrial fibrillation: Secondary | ICD-10-CM

## 2021-09-09 DIAGNOSIS — Z01812 Encounter for preprocedural laboratory examination: Secondary | ICD-10-CM

## 2021-09-09 LAB — CUP PACEART REMOTE DEVICE CHECK
Battery Remaining Longevity: 102 mo
Battery Remaining Percentage: 92 %
Battery Voltage: 3.01 V
Brady Statistic AP VP Percent: 3.4 %
Brady Statistic AP VS Percent: 77 %
Brady Statistic AS VP Percent: 1 %
Brady Statistic AS VS Percent: 20 %
Brady Statistic RA Percent Paced: 79 %
Brady Statistic RV Percent Paced: 3.4 %
Date Time Interrogation Session: 20220929051939
Implantable Lead Implant Date: 20211011
Implantable Lead Implant Date: 20211011
Implantable Lead Location: 753859
Implantable Lead Location: 753860
Implantable Pulse Generator Implant Date: 20211011
Lead Channel Impedance Value: 390 Ohm
Lead Channel Impedance Value: 480 Ohm
Lead Channel Pacing Threshold Amplitude: 0.75 V
Lead Channel Pacing Threshold Amplitude: 0.75 V
Lead Channel Pacing Threshold Pulse Width: 0.5 ms
Lead Channel Pacing Threshold Pulse Width: 0.5 ms
Lead Channel Sensing Intrinsic Amplitude: 1.6 mV
Lead Channel Sensing Intrinsic Amplitude: 6.1 mV
Lead Channel Setting Pacing Amplitude: 2 V
Lead Channel Setting Pacing Amplitude: 2 V
Lead Channel Setting Pacing Pulse Width: 0.5 ms
Lead Channel Setting Sensing Sensitivity: 2 mV
Pulse Gen Model: 2272
Pulse Gen Serial Number: 3869163

## 2021-09-09 NOTE — Telephone Encounter (Signed)
Pt c/o medication issue:  1. Name of Medication: amiodarone (PACERONE) 200 MG tablet  2. How are you currently taking this medication (dosage and times per day)? Take 2 tablets (400 mg total) by mouth 2 (two) times daily for 14 days, THEN 1 tablet (200 mg total) 2 (two) times daily for 14 days.  3. Are you having a reaction (difficulty breathing--STAT)?    4. What is your medication issue? Patient daughter called in to see if it can be possible to scale the dosage back. State that her father almost fall from having dizziness. Please advise

## 2021-09-13 ENCOUNTER — Ambulatory Visit: Payer: PPO | Admitting: Cardiology

## 2021-09-15 DIAGNOSIS — Z23 Encounter for immunization: Secondary | ICD-10-CM | POA: Diagnosis not present

## 2021-09-17 NOTE — Progress Notes (Signed)
Remote pacemaker transmission.   

## 2021-09-23 NOTE — Telephone Encounter (Signed)
Followed up with dtr. She reports that dad stopped Amiodarone, they are not sure when.  Thinking it may have been last week. He keeps having light headedness and feels wobbly/off balance. HRs avg 60s SBP avg 110s  Dtr does report that pt has chronic low BPs and informed her that this may be the cause of the light headedness and not necessarily the medication. Aware that I will forward to MD for review/advisement. Aware I will call back next week w/ MD recommendation/s. Dtr verbalized understanding and agreeable to plan.

## 2021-10-01 NOTE — Telephone Encounter (Signed)
Spoke to pt who reports he is doing much better. States that he is not currently having any issues w/ his AFib, HRs avg mid 60s. Reports light headedness/off balance has resolved since stopping Amiodarone about 4 days ago. Aware I will re-discuss need for another medication vs nothing with MD.

## 2021-10-05 DIAGNOSIS — Z01812 Encounter for preprocedural laboratory examination: Secondary | ICD-10-CM | POA: Diagnosis not present

## 2021-10-05 DIAGNOSIS — I4819 Other persistent atrial fibrillation: Secondary | ICD-10-CM | POA: Diagnosis not present

## 2021-10-06 LAB — CBC
Hematocrit: 29.2 % — ABNORMAL LOW (ref 37.5–51.0)
Hemoglobin: 10.5 g/dL — ABNORMAL LOW (ref 13.0–17.7)
MCH: 33.8 pg — ABNORMAL HIGH (ref 26.6–33.0)
MCHC: 36 g/dL — ABNORMAL HIGH (ref 31.5–35.7)
MCV: 94 fL (ref 79–97)
Platelets: 292 10*3/uL (ref 150–450)
RBC: 3.11 x10E6/uL — ABNORMAL LOW (ref 4.14–5.80)
RDW: 21.6 % — ABNORMAL HIGH (ref 11.6–15.4)
WBC: 5.5 10*3/uL (ref 3.4–10.8)

## 2021-10-06 LAB — BASIC METABOLIC PANEL
BUN/Creatinine Ratio: 10 (ref 10–24)
BUN: 16 mg/dL (ref 8–27)
CO2: 22 mmol/L (ref 20–29)
Calcium: 9.3 mg/dL (ref 8.6–10.2)
Chloride: 107 mmol/L — ABNORMAL HIGH (ref 96–106)
Creatinine, Ser: 1.65 mg/dL — ABNORMAL HIGH (ref 0.76–1.27)
Glucose: 106 mg/dL — ABNORMAL HIGH (ref 70–99)
Potassium: 4.8 mmol/L (ref 3.5–5.2)
Sodium: 143 mmol/L (ref 134–144)
eGFR: 41 mL/min/{1.73_m2} — ABNORMAL LOW (ref >59)

## 2021-10-20 ENCOUNTER — Telehealth (HOSPITAL_COMMUNITY): Payer: Self-pay | Admitting: Emergency Medicine

## 2021-10-20 NOTE — Telephone Encounter (Signed)
Reaching out to patient to offer assistance regarding upcoming cardiac imaging study; pt verbalizes understanding of appt date/time, parking situation and where to check in, pre-test NPO status and medications ordered, and verified current allergies; name and call back number provided for further questions should they arise Marchia Bond RN Navigator Cardiac Imaging [facility] [phone] office [phone] cell  Daily meds Denies iv issues

## 2021-10-21 DIAGNOSIS — Z23 Encounter for immunization: Secondary | ICD-10-CM | POA: Diagnosis not present

## 2021-10-21 DIAGNOSIS — Z5181 Encounter for therapeutic drug level monitoring: Secondary | ICD-10-CM | POA: Diagnosis not present

## 2021-10-21 DIAGNOSIS — I4819 Other persistent atrial fibrillation: Secondary | ICD-10-CM | POA: Diagnosis not present

## 2021-10-21 DIAGNOSIS — R7989 Other specified abnormal findings of blood chemistry: Secondary | ICD-10-CM | POA: Diagnosis not present

## 2021-10-22 ENCOUNTER — Other Ambulatory Visit: Payer: Self-pay

## 2021-10-22 ENCOUNTER — Ambulatory Visit (HOSPITAL_COMMUNITY)
Admission: RE | Admit: 2021-10-22 | Discharge: 2021-10-22 | Disposition: A | Discharge status: Home / Self Care | Payer: PPO | Source: Ambulatory Visit | Attending: Cardiology | Admitting: Cardiology

## 2021-10-22 DIAGNOSIS — I4819 Other persistent atrial fibrillation: Secondary | ICD-10-CM | POA: Insufficient documentation

## 2021-10-22 MED ORDER — IOHEXOL 350 MG/ML SOLN
95.0000 mL | Freq: Once | INTRAVENOUS | Status: AC | PRN
  Administered 2021-10-22: 95 mL via INTRAVENOUS

## 2021-10-28 NOTE — Pre-Procedure Instructions (Signed)
Instructed patient on the following items: Arrival time 0530 Nothing to eat or drink after midnight No meds AM of procedure Responsible person to drive you home and stay with you for 24 hrs  Have you missed any doses of anti-coagulant Eliquis- hasn't missed any doses    

## 2021-10-28 NOTE — Anesthesia Preprocedure Evaluation (Addendum)
Anesthesia Evaluation  Patient identified by MRN, date of birth, ID band Patient awake    Reviewed: Allergy & Precautions, H&P , NPO status , Patient's Chart, lab work & pertinent test results, reviewed documented beta blocker date and time   History of Anesthesia Complications Negative for: history of anesthetic complications  Airway Mallampati: I  TM Distance: >3 FB Neck ROM: Full    Dental no notable dental hx. (+) Edentulous Upper, Edentulous Lower   Pulmonary neg pulmonary ROS, former smoker,    Pulmonary exam normal breath sounds clear to auscultation       Cardiovascular hypertension, Pt. on medications and Pt. on home beta blockers (-) angina+ CAD  Normal cardiovascular exam+ dysrhythmias Atrial Fibrillation  Rhythm:Regular Rate:Normal  9'2020 Stress: EF 70%, no ischemia or infarct '12 Stress: EF 67%, no ischemia 07/2019 ECHO: EF 55-60%, trace AI, mod TR  Nuclear stress test 09/04/19 [facility]) IMPRESSION: 1. No reversible ischemia or infarction. 2. Normal left ventricular wall motion. 3. Left ventricular ejection fraction 70% 4. Non invasive risk stratification*: Low   Neuro/Psych Anxiety Chronic back pain  Neuromuscular disease negative neurological ROS     GI/Hepatic negative GI ROS, Neg liver ROS,   Endo/Other  negative endocrine ROS  Renal/GU Renal disease   H/o prostate cancer negative genitourinary   Musculoskeletal negative musculoskeletal ROS (+) Arthritis , Osteoarthritis,    Abdominal   Peds negative pediatric ROS (+)  Hematology  (+) Blood dyscrasia, anemia , Eliquis: last dose Wednesday   Anesthesia Other Findings   Reproductive/Obstetrics negative OB ROS                           Anesthesia Physical  Anesthesia Plan  ASA: 3  Anesthesia Plan: General   Post-op Pain Management:    Induction: Intravenous  PONV Risk Score and Plan: 3 and Ondansetron,  Dexamethasone and Treatment may vary due to age or medical condition  Airway Management Planned: Oral ETT  Additional Equipment: None  Intra-op Plan:   Post-operative Plan: Extubation in OR  Informed Consent: I have reviewed the patients History and Physical, chart, labs and discussed the procedure including the risks, benefits and alternatives for the proposed anesthesia with the patient or authorized representative who has indicated his/her understanding and acceptance.       Plan Discussed with: CRNA and Anesthesiologist  Anesthesia Plan Comments: (PAT note written 11/22/2019 by Myra Gianotti, PA-C. )        Anesthesia Quick Evaluation

## 2021-10-29 ENCOUNTER — Other Ambulatory Visit: Payer: Self-pay

## 2021-10-29 ENCOUNTER — Ambulatory Visit (HOSPITAL_COMMUNITY): Payer: PPO | Admitting: Anesthesiology

## 2021-10-29 ENCOUNTER — Ambulatory Visit (HOSPITAL_COMMUNITY)
Admission: RE | Admit: 2021-10-29 | Discharge: 2021-10-29 | Disposition: A | Discharge status: Home / Self Care | Payer: PPO | Attending: Cardiology | Admitting: Cardiology

## 2021-10-29 ENCOUNTER — Ambulatory Visit (HOSPITAL_COMMUNITY)
Admission: RE | Disposition: A | Discharge status: Home / Self Care | Payer: Self-pay | Source: Home / Self Care | Attending: Cardiology

## 2021-10-29 DIAGNOSIS — I10 Essential (primary) hypertension: Secondary | ICD-10-CM | POA: Diagnosis not present

## 2021-10-29 DIAGNOSIS — I2510 Atherosclerotic heart disease of native coronary artery without angina pectoris: Secondary | ICD-10-CM | POA: Diagnosis not present

## 2021-10-29 DIAGNOSIS — I4892 Unspecified atrial flutter: Secondary | ICD-10-CM | POA: Diagnosis not present

## 2021-10-29 DIAGNOSIS — Z950 Presence of cardiac pacemaker: Secondary | ICD-10-CM | POA: Diagnosis not present

## 2021-10-29 DIAGNOSIS — I4819 Other persistent atrial fibrillation: Secondary | ICD-10-CM | POA: Insufficient documentation

## 2021-10-29 DIAGNOSIS — I480 Paroxysmal atrial fibrillation: Secondary | ICD-10-CM | POA: Diagnosis not present

## 2021-10-29 DIAGNOSIS — N183 Chronic kidney disease, stage 3 unspecified: Secondary | ICD-10-CM | POA: Diagnosis not present

## 2021-10-29 DIAGNOSIS — Z87442 Personal history of urinary calculi: Secondary | ICD-10-CM | POA: Diagnosis not present

## 2021-10-29 HISTORY — PX: ATRIAL FIBRILLATION ABLATION: EP1191

## 2021-10-29 SURGERY — ATRIAL FIBRILLATION ABLATION
Anesthesia: General

## 2021-10-29 MED ORDER — HEPARIN SODIUM (PORCINE) 1000 UNIT/ML IJ SOLN
INTRAMUSCULAR | Status: AC
  Filled 2021-10-29: qty 1

## 2021-10-29 MED ORDER — SUGAMMADEX SODIUM 200 MG/2ML IV SOLN
INTRAVENOUS | Status: DC | PRN
  Administered 2021-10-29: 200 mg via INTRAVENOUS

## 2021-10-29 MED ORDER — HEPARIN (PORCINE) IN NACL 1000-0.9 UT/500ML-% IV SOLN
INTRAVENOUS | Status: AC
  Filled 2021-10-29: qty 500

## 2021-10-29 MED ORDER — APIXABAN 5 MG PO TABS
5.0000 mg | ORAL_TABLET | Freq: Once | ORAL | Status: AC
Start: 2021-10-29 — End: 2021-10-29
  Administered 2021-10-29: 5 mg via ORAL
  Filled 2021-10-29 (×2): qty 1

## 2021-10-29 MED ORDER — PHENYLEPHRINE HCL-NACL 20-0.9 MG/250ML-% IV SOLN
INTRAVENOUS | Status: DC | PRN
  Administered 2021-10-29: 25 ug/min via INTRAVENOUS

## 2021-10-29 MED ORDER — CEFAZOLIN SODIUM-DEXTROSE 2-3 GM-%(50ML) IV SOLR
INTRAVENOUS | Status: DC | PRN
  Administered 2021-10-29: 2 g via INTRAVENOUS

## 2021-10-29 MED ORDER — ONDANSETRON HCL 4 MG/2ML IJ SOLN: 4.0000 mg | Freq: Once | INTRAMUSCULAR | Status: DC | PRN

## 2021-10-29 MED ORDER — ACETAMINOPHEN 160 MG/5ML PO SOLN
325.0000 mg | ORAL | Status: DC | PRN
  Filled 2021-10-29: qty 20.3

## 2021-10-29 MED ORDER — ROCURONIUM BROMIDE 10 MG/ML (PF) SYRINGE
PREFILLED_SYRINGE | INTRAVENOUS | Status: DC | PRN
  Administered 2021-10-29: 40 mg via INTRAVENOUS
  Administered 2021-10-29: 10 mg via INTRAVENOUS

## 2021-10-29 MED ORDER — MEPERIDINE HCL 25 MG/ML IJ SOLN
6.2500 mg | INTRAMUSCULAR | Status: DC | PRN
  Filled 2021-10-29: qty 1

## 2021-10-29 MED ORDER — OXYCODONE HCL 5 MG PO TABS
5.0000 mg | ORAL_TABLET | Freq: Once | ORAL | Status: DC | PRN
  Filled 2021-10-29: qty 1

## 2021-10-29 MED ORDER — DOBUTAMINE INFUSION FOR EP/ECHO/NUC (1000 MCG/ML)
INTRAVENOUS | Status: DC | PRN
  Administered 2021-10-29: 20 ug/kg/min via INTRAVENOUS

## 2021-10-29 MED ORDER — LIDOCAINE 2% (20 MG/ML) 5 ML SYRINGE
INTRAMUSCULAR | Status: DC | PRN
  Administered 2021-10-29: 40 mg via INTRAVENOUS

## 2021-10-29 MED ORDER — FENTANYL CITRATE (PF) 100 MCG/2ML IJ SOLN: 25.0000 ug | INTRAMUSCULAR | Status: DC | PRN

## 2021-10-29 MED ORDER — HEPARIN SODIUM (PORCINE) 1000 UNIT/ML IJ SOLN
INTRAMUSCULAR | Status: DC | PRN
  Administered 2021-10-29: 1000 [IU] via INTRAVENOUS

## 2021-10-29 MED ORDER — FENTANYL CITRATE (PF) 100 MCG/2ML IJ SOLN
INTRAMUSCULAR | Status: DC | PRN
  Administered 2021-10-29 (×2): 25 ug via INTRAVENOUS
  Administered 2021-10-29: 50 ug via INTRAVENOUS

## 2021-10-29 MED ORDER — PROTAMINE SULFATE 10 MG/ML IV SOLN
INTRAVENOUS | Status: DC | PRN
  Administered 2021-10-29: 40 mg via INTRAVENOUS

## 2021-10-29 MED ORDER — OXYCODONE HCL 5 MG/5ML PO SOLN
5.0000 mg | Freq: Once | ORAL | Status: DC | PRN
Start: 2021-10-29 — End: 2021-10-29
  Filled 2021-10-29: qty 5

## 2021-10-29 MED ORDER — LIDOCAINE-EPINEPHRINE 1 %-1:100000 IJ SOLN
INTRAMUSCULAR | Status: AC
  Filled 2021-10-29: qty 1

## 2021-10-29 MED ORDER — ACETAMINOPHEN 325 MG PO TABS
325.0000 mg | ORAL_TABLET | ORAL | Status: DC | PRN
  Filled 2021-10-29: qty 2

## 2021-10-29 MED ORDER — SODIUM CHLORIDE 0.9% FLUSH: 3.0000 mL | Freq: Two times a day (BID) | INTRAVENOUS | Status: DC

## 2021-10-29 MED ORDER — DEXAMETHASONE SODIUM PHOSPHATE 10 MG/ML IJ SOLN
INTRAMUSCULAR | Status: DC | PRN
  Administered 2021-10-29: 10 mg via INTRAVENOUS

## 2021-10-29 MED ORDER — SODIUM CHLORIDE 0.9 % IV SOLN: 250.0000 mL | INTRAVENOUS | Status: DC | PRN

## 2021-10-29 MED ORDER — HEPARIN (PORCINE) IN NACL 1000-0.9 UT/500ML-% IV SOLN
INTRAVENOUS | Status: DC | PRN
  Administered 2021-10-29 (×5): 500 mL

## 2021-10-29 MED ORDER — CEFAZOLIN SODIUM-DEXTROSE 2-4 GM/100ML-% IV SOLN
INTRAVENOUS | Status: AC
  Filled 2021-10-29: qty 100

## 2021-10-29 MED ORDER — HEPARIN SODIUM (PORCINE) 1000 UNIT/ML IJ SOLN
INTRAMUSCULAR | Status: DC | PRN
  Administered 2021-10-29: 8000 [IU] via INTRAVENOUS
  Administered 2021-10-29: 14000 [IU] via INTRAVENOUS
  Administered 2021-10-29: 8000 [IU] via INTRAVENOUS
  Administered 2021-10-29: 3000 [IU] via INTRAVENOUS

## 2021-10-29 MED ORDER — SODIUM CHLORIDE 0.9 % IV SOLN: INTRAVENOUS | Status: DC

## 2021-10-29 MED ORDER — DOBUTAMINE INFUSION FOR EP/ECHO/NUC (1000 MCG/ML)
INTRAVENOUS | Status: AC
  Filled 2021-10-29: qty 250

## 2021-10-29 MED ORDER — PROPOFOL 10 MG/ML IV BOLUS
INTRAVENOUS | Status: DC | PRN
  Administered 2021-10-29: 150 mg via INTRAVENOUS

## 2021-10-29 MED ORDER — SODIUM CHLORIDE 0.9% FLUSH: 3.0000 mL | INTRAVENOUS | Status: DC | PRN

## 2021-10-29 SURGICAL SUPPLY — 21 items
BAG SNAP BAND KOVER 36X36 (MISCELLANEOUS) ×1 IMPLANT
BLANKET WARM UNDERBOD FULL ACC (MISCELLANEOUS) ×2 IMPLANT
CATH OCTARAY 2.0 F 3-3-3-3-3 (CATHETERS) ×1 IMPLANT
CATH S CIRCA THERM PROBE 10F (CATHETERS) ×1 IMPLANT
CATH SMTCH THERMOCOOL SF DF (CATHETERS) ×1 IMPLANT
CATH SOUNDSTAR ECO 8FR (CATHETERS) ×1 IMPLANT
CATH WEB BI DIR CSDF CRV REPRO (CATHETERS) ×1 IMPLANT
CLOSURE PERCLOSE PROSTYLE (VASCULAR PRODUCTS) ×4 IMPLANT
COVER SWIFTLINK CONNECTOR (BAG) ×2 IMPLANT
KIT CATH VERSACROSS STEERABLE (CATHETERS) ×1 IMPLANT
PACK EP LATEX FREE (CUSTOM PROCEDURE TRAY) ×2
PACK EP LF (CUSTOM PROCEDURE TRAY) ×1 IMPLANT
PAD DEFIB RADIO PHYSIO CONN (PAD) ×2 IMPLANT
PATCH CARTO3 (PAD) ×1 IMPLANT
SHEATH CARTO VIZIGO SM CVD (SHEATH) ×1 IMPLANT
SHEATH PINNACLE 7F 10CM (SHEATH) ×1 IMPLANT
SHEATH PINNACLE 8F 10CM (SHEATH) ×2 IMPLANT
SHEATH PINNACLE 9F 10CM (SHEATH) ×1 IMPLANT
SHEATH PROBE COVER 6X72 (BAG) ×1 IMPLANT
TUBING SMART ABLATE COOLFLOW (TUBING) ×1 IMPLANT
WIRE GUIDERIGHT .032X180 (WIRE) ×1 IMPLANT

## 2021-10-29 NOTE — Progress Notes (Signed)
Attempted to ambulate patient at 1415, left groin site started slightly oozing through dressing. Held pressure x10 minutes. Dressing still with some oozing. Manual pressure held for an additional 10 minutes. At 1500, RN changed both dressings, and when they were removed, both groin sites started oozing. Jonni Sanger, [place] called and at bedside at 1515 to inject epi/lido. Pt remained supine for an additional 30 minutes, per verbal from [place], [place]. Pt ambulated at 1545 with no oozing or bleeding noted from either groin. Pt DC at 1550.

## 2021-10-29 NOTE — Progress Notes (Signed)
Dr Curt Bears informed that pt has oozing from right groin earlier, but none since. No new orders.

## 2021-10-29 NOTE — Progress Notes (Signed)
Slight oozing noted from right groin pressure held and new dressing applied.

## 2021-10-29 NOTE — H&P (Signed)
Electrophysiology Office Note   Date:  10/29/2021   ID:  Todd Salazar, DOB 10/22/1936, MRN [id number]  PCP:  [name], DO  Cardiologist:   Primary Electrophysiologist:  Lauren Modisette Meredith Leeds, MD    Chief Complaint: AF   History of Present Illness: Todd Salazar is a 85 y.o. male who is being seen today for the evaluation of AF at the request of No ref. provider found. Presenting today for electrophysiology evaluation.  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  He presented to the hospital September 2022 and atrial fibrillation.  He is now status post dofetilide load.  Today, denies symptoms of palpitations, chest pain, shortness of breath, orthopnea, PND, lower extremity edema, claudication, dizziness, presyncope, syncope, bleeding, or neurologic sequela. The patient is tolerating medications without difficulties. Plan ablation today.    Past Medical History:  Diagnosis Date   Arthritis    Atrial fibrillation ([place])    Cancer ([place])    prostate   Dysrhythmia    afib   History of kidney stones    Past Surgical History:  Procedure Laterality Date   Bone spur     CHOLECYSTECTOMY     HERNIA REPAIR     JOINT REPLACEMENT     kidney stones     LAMINECTOMY WITH POSTERIOR LATERAL ARTHRODESIS LEVEL 1 Right 11/25/2019   Procedure: Laminectomy and Foraminotomy  Lumbar Four-Lumbar Five - right, with instrumented fusion;  Surgeon: Eustace Moore, MD;  Location: [facility];  Service: Neurosurgery;  Laterality: Right;  Laminectomy and Foraminotomy  Lumbar Four-Lumbar Five - right, with instrumented fusion   PROSTATE SURGERY       Current Facility-Administered Medications  Medication Dose Route Frequency Provider Last Rate Last Admin   0.9 %  sodium chloride infusion   Intravenous Continuous  Constance Haw, MD 50 mL/hr at 10/29/21 0553 New Bag at 10/29/21 0553    Allergies:   Ciprofloxacin, Tramadol, Duloxetine, Hydrocodone-acetaminophen, Benadryl [diphenhydramine], Nisoldipine, Methocarbamol, Ondansetron hcl, and Sulfa antibiotics   Social History:  The patient  reports that he quit smoking about 24 years ago. His smoking use included cigarettes. He has never used smokeless tobacco. He reports that he does not drink alcohol and does not use drugs.   Family History:  The patient's family history includes Hypertension in his father.   ROS:  Please see the history of present illness.   Otherwise, review of systems is positive for none.   All other systems are reviewed and negative.   PHYSICAL EXAM: VS:  BP 115/64   Pulse (!) 59   Temp 98.2 F (36.8 C) (Oral)   Resp 18   Ht 6\' 3"  (1.905 m)   Wt 84.8 kg   SpO2 97%   BMI 23.37 kg/m  , BMI Body mass index is 23.37 kg/m. GEN: Well nourished, well developed, in no acute distress  HEENT: normal  Neck: no JVD, carotid bruits, or masses Cardiac: RRR; no murmurs, rubs, or gallops,no edema  Respiratory:  clear to auscultation bilaterally, normal work of breathing GI: soft, nontender, nondistended, + BS MS: no deformity or atrophy  Skin: warm and dry Neuro:  Strength and sensation are intact Psych: euthymic mood, full affect   Recent Labs: 08/17/2021: TSH 1.677 08/23/2021: ALT 28 08/27/2021: Magnesium 1.9 10/05/2021: BUN 16; Creatinine, Ser 1.65; Hemoglobin 10.5; Platelets 292; Potassium 4.8; Sodium 143  Lipid Panel     Component Value Date/Time   CHOL 174 12/11/2011 0500   TRIG 123 12/11/2011 0500   HDL 29 (L) 12/11/2011 0500   CHOLHDL 6.0 12/11/2011 0500   VLDL 25 12/11/2011 0500   LDLCALC 120 (H) 12/11/2011 0500     Wt Readings from Last 3 Encounters:  10/29/21 84.8 kg  08/31/21 84.4 kg  08/27/21 85.1 kg      Other studies Reviewed: Additional studies/ records that were reviewed today include: TTE  08/19/21  Review of the above records today demonstrates:   1. Left ventricular ejection fraction, by estimation, is 55 to 60%. The  left ventricle has normal function. The left ventricle has no regional  wall motion abnormalities. There is mild left ventricular hypertrophy.  Left ventricular diastolic parameters  were normal.   2. Right ventricular systolic function is normal. The right ventricular  size is mildly enlarged. There is normal pulmonary artery systolic  pressure. The estimated right ventricular systolic pressure is 98.2 mmHg.   3. Right atrial size was mildly dilated.   4. The mitral valve is normal in structure. Trivial mitral valve  regurgitation.   5. Tricuspid valve regurgitation is mild to moderate.   6. The aortic valve is tricuspid. Aortic valve regurgitation is not  visualized. No aortic stenosis is present.   7. The inferior vena cava is normal in size with greater than 50%  respiratory variability, suggesting right atrial pressure of 3 mmHg.    ASSESSMENT AND PLAN:  1.  Persistent atrial fibrillation/atrial flutter: Todd Salazar has presented today for surgery, with the diagnosis of AF.  The various methods of treatment have been discussed with the patient and family. After consideration of risks, benefits and other options for treatment, the patient has consented to  Procedure(s): Catheter ablation as a surgical intervention .  Risks include but not limited to complete heart block, stroke, esophageal damage, nerve damage, bleeding, vascular damage, tamponade, perforation, MI, and death. The patient's history has been reviewed, patient examined, no change in status, stable for surgery.  I have reviewed the patient's chart and labs.  Questions were answered to the patient's satisfaction.    Todd Pryde Curt Bears, MD 10/29/2021 7:08 AM

## 2021-10-29 NOTE — Progress Notes (Signed)
Todd Salazar, [place] in to see pt states to keep pt for 30 minutes and Dr Curt Bears will come see the pt. Pt son called and updated. Pt also updated.

## 2021-10-29 NOTE — Anesthesia Postprocedure Evaluation (Signed)
Anesthesia Post Note  Patient: Todd Salazar  Procedure(s) Performed: ATRIAL FIBRILLATION ABLATION     Patient location during evaluation: PACU Anesthesia Type: General Level of consciousness: awake and alert Pain management: pain level controlled Vital Signs Assessment: post-procedure vital signs reviewed and stable Respiratory status: spontaneous breathing, nonlabored ventilation, respiratory function stable and patient connected to nasal cannula oxygen Cardiovascular status: blood pressure returned to baseline and stable Postop Assessment: no apparent nausea or vomiting Anesthetic complications: no   There were no known notable events for this encounter.  Last Vitals:  Vitals:   10/29/21 1300 10/29/21 1315  BP: (!) 103/50   Pulse: 67 69  Resp: 17 18  Temp:    SpO2: 100% 99%    Last Pain:  Vitals:   10/29/21 1037  TempSrc:   PainSc: 0-No pain                 Darcia Lampi

## 2021-10-29 NOTE — Discharge Instructions (Signed)

## 2021-10-29 NOTE — Progress Notes (Signed)
After ambulation pt had bleeding noted from left groin pressure held and dressing changed. Dr Curt Bears called and informed. States they will send [place] over.

## 2021-10-29 NOTE — Progress Notes (Signed)
Discharge instructions reviewed with pt and his son, both voiced understanding.

## 2021-10-29 NOTE — Progress Notes (Signed)
Oda Kilts, [place]  paged

## 2021-10-29 NOTE — Progress Notes (Signed)
Ambulated in hallway tol well no bleeding noted before or after ambulation  

## 2021-10-29 NOTE — Transfer of Care (Signed)
Immediate Anesthesia Transfer of Care Note  Patient: Todd Salazar  Procedure(s) Performed: ATRIAL FIBRILLATION ABLATION  Patient Location: Cath Lab  Anesthesia Type:General  Level of Consciousness: awake, alert  and oriented  Airway & Oxygen Therapy: Patient connected to nasal cannula oxygen  Post-op Assessment: Post -op Vital signs reviewed and stable  Post vital signs: stable  Last Vitals:  Vitals Value Taken Time  BP 116/55 10/29/21 1001  Temp    Pulse 63 10/29/21 1003  Resp 21 10/29/21 1003  SpO2 95 % 10/29/21 1003  Vitals shown include unvalidated device data.  Last Pain:  Vitals:   10/29/21 0548  TempSrc:   PainSc: 0-No pain         Complications: There were no known notable events for this encounter.

## 2021-10-29 NOTE — Anesthesia Procedure Notes (Signed)
Procedure Name: Intubation Date/Time: 10/29/2021 7:43 AM Performed by: Lavell Luster, CRNA Pre-anesthesia Checklist: Patient identified, Emergency Drugs available, Suction available, Patient being monitored and Timeout performed Patient Re-evaluated:Patient Re-evaluated prior to induction Oxygen Delivery Method: Circle system utilized Preoxygenation: Pre-oxygenation with 100% oxygen Induction Type: IV induction Ventilation: Mask ventilation without difficulty Laryngoscope Size: Mac and 3 Grade View: Grade I Tube type: Oral Tube size: 7.5 mm Number of attempts: 1 Placement Confirmation: ETT inserted through vocal cords under direct vision, positive ETCO2 and breath sounds checked- equal and bilateral Secured at: 20 cm Tube secured with: Tape Dental Injury: Teeth and Oropharynx as per pre-operative assessment

## 2021-10-31 ENCOUNTER — Encounter (HOSPITAL_COMMUNITY): Payer: Self-pay | Admitting: Emergency Medicine

## 2021-10-31 ENCOUNTER — Emergency Department (HOSPITAL_COMMUNITY)
Admission: EM | Admit: 2021-10-31 | Discharge: 2021-10-31 | Disposition: A | Discharge status: Home / Self Care | Payer: PPO | Attending: Emergency Medicine | Admitting: Emergency Medicine

## 2021-10-31 ENCOUNTER — Emergency Department (HOSPITAL_COMMUNITY): Payer: PPO

## 2021-10-31 ENCOUNTER — Other Ambulatory Visit: Payer: Self-pay

## 2021-10-31 DIAGNOSIS — Z8546 Personal history of malignant neoplasm of prostate: Secondary | ICD-10-CM | POA: Insufficient documentation

## 2021-10-31 DIAGNOSIS — I308 Other forms of acute pericarditis: Secondary | ICD-10-CM | POA: Insufficient documentation

## 2021-10-31 DIAGNOSIS — I4819 Other persistent atrial fibrillation: Secondary | ICD-10-CM | POA: Diagnosis not present

## 2021-10-31 DIAGNOSIS — R0789 Other chest pain: Secondary | ICD-10-CM | POA: Diagnosis not present

## 2021-10-31 DIAGNOSIS — K5730 Diverticulosis of large intestine without perforation or abscess without bleeding: Secondary | ICD-10-CM | POA: Diagnosis not present

## 2021-10-31 DIAGNOSIS — J984 Other disorders of lung: Secondary | ICD-10-CM | POA: Diagnosis not present

## 2021-10-31 DIAGNOSIS — I300 Acute nonspecific idiopathic pericarditis: Secondary | ICD-10-CM

## 2021-10-31 DIAGNOSIS — I959 Hypotension, unspecified: Secondary | ICD-10-CM | POA: Diagnosis not present

## 2021-10-31 DIAGNOSIS — I2510 Atherosclerotic heart disease of native coronary artery without angina pectoris: Secondary | ICD-10-CM | POA: Diagnosis not present

## 2021-10-31 DIAGNOSIS — Z7901 Long term (current) use of anticoagulants: Secondary | ICD-10-CM | POA: Diagnosis not present

## 2021-10-31 DIAGNOSIS — Z20822 Contact with and (suspected) exposure to covid-19: Secondary | ICD-10-CM | POA: Diagnosis not present

## 2021-10-31 DIAGNOSIS — R072 Precordial pain: Secondary | ICD-10-CM | POA: Diagnosis present

## 2021-10-31 DIAGNOSIS — R079 Chest pain, unspecified: Secondary | ICD-10-CM | POA: Diagnosis not present

## 2021-10-31 DIAGNOSIS — I871 Compression of vein: Secondary | ICD-10-CM | POA: Diagnosis not present

## 2021-10-31 DIAGNOSIS — J9811 Atelectasis: Secondary | ICD-10-CM | POA: Diagnosis not present

## 2021-10-31 DIAGNOSIS — I480 Paroxysmal atrial fibrillation: Secondary | ICD-10-CM | POA: Insufficient documentation

## 2021-10-31 DIAGNOSIS — Z87891 Personal history of nicotine dependence: Secondary | ICD-10-CM | POA: Diagnosis not present

## 2021-10-31 DIAGNOSIS — I10 Essential (primary) hypertension: Secondary | ICD-10-CM | POA: Diagnosis not present

## 2021-10-31 DIAGNOSIS — I309 Acute pericarditis, unspecified: Secondary | ICD-10-CM | POA: Diagnosis not present

## 2021-10-31 DIAGNOSIS — I771 Stricture of artery: Secondary | ICD-10-CM | POA: Diagnosis not present

## 2021-10-31 DIAGNOSIS — N281 Cyst of kidney, acquired: Secondary | ICD-10-CM | POA: Diagnosis not present

## 2021-10-31 LAB — BASIC METABOLIC PANEL
Anion gap: 7 (ref 5–15)
BUN: 20 mg/dL (ref 8–23)
CO2: 23 mmol/L (ref 22–32)
Calcium: 8.6 mg/dL — ABNORMAL LOW (ref 8.9–10.3)
Chloride: 109 mmol/L (ref 98–111)
Creatinine, Ser: 1.36 mg/dL — ABNORMAL HIGH (ref 0.61–1.24)
GFR, Estimated: 52 mL/min — ABNORMAL LOW (ref >60)
Glucose, Bld: 112 mg/dL — ABNORMAL HIGH (ref 70–99)
Potassium: 4.7 mmol/L (ref 3.5–5.1)
Sodium: 139 mmol/L (ref 135–145)

## 2021-10-31 LAB — RESP PANEL BY RT-PCR (FLU A&B, COVID) ARPGX2
Influenza A by PCR: NEGATIVE
Influenza B by PCR: NEGATIVE
SARS Coronavirus 2 by RT PCR: NEGATIVE

## 2021-10-31 LAB — CBC
HCT: 26.2 % — ABNORMAL LOW (ref 39.0–52.0)
Hemoglobin: 8.7 g/dL — ABNORMAL LOW (ref 13.0–17.0)
MCH: 34.4 pg — ABNORMAL HIGH (ref 26.0–34.0)
MCHC: 33.2 g/dL (ref 30.0–36.0)
MCV: 103.6 fL — ABNORMAL HIGH (ref 80.0–100.0)
Platelets: 261 10*3/uL (ref 150–400)
RBC: 2.53 MIL/uL — ABNORMAL LOW (ref 4.22–5.81)
RDW: 24.5 % — ABNORMAL HIGH (ref 11.5–15.5)
WBC: 14.2 10*3/uL — ABNORMAL HIGH (ref 4.0–10.5)
nRBC: 0.6 % — ABNORMAL HIGH (ref 0.0–0.2)

## 2021-10-31 LAB — TROPONIN I (HIGH SENSITIVITY)
Troponin I (High Sensitivity): 384 ng/L (ref <18)
Troponin I (High Sensitivity): 330 ng/L (ref <18)

## 2021-10-31 MED ORDER — COLCHICINE 0.6 MG PO TABS
0.6000 mg | ORAL_TABLET | Freq: Once | ORAL | Status: AC
  Administered 2021-10-31: 0.6 mg via ORAL
  Filled 2021-10-31 (×2): qty 1

## 2021-10-31 MED ORDER — FENTANYL CITRATE PF 50 MCG/ML IJ SOSY
25.0000 ug | PREFILLED_SYRINGE | Freq: Once | INTRAMUSCULAR | Status: AC
  Administered 2021-10-31: 25 ug via INTRAVENOUS

## 2021-10-31 MED ORDER — FENTANYL CITRATE PF 50 MCG/ML IJ SOSY
75.0000 ug | PREFILLED_SYRINGE | Freq: Once | INTRAMUSCULAR | Status: AC
  Administered 2021-10-31: 75 ug via INTRAVENOUS
  Filled 2021-10-31: qty 2

## 2021-10-31 MED ORDER — COLCHICINE 0.6 MG PO TABS: ORAL_TABLET | ORAL | 0 refills | Status: DC

## 2021-10-31 MED ORDER — FENTANYL CITRATE (PF) 100 MCG/2ML IJ SOLN
INTRAMUSCULAR | Status: AC
  Filled 2021-10-31: qty 2

## 2021-10-31 MED ORDER — ACETAMINOPHEN 325 MG PO TABS
650.0000 mg | ORAL_TABLET | Freq: Once | ORAL | Status: AC
  Administered 2021-10-31: 650 mg via ORAL
  Filled 2021-10-31: qty 2

## 2021-10-31 MED ORDER — ALUM & MAG HYDROXIDE-SIMETH 200-200-20 MG/5ML PO SUSP
30.0000 mL | Freq: Once | ORAL | Status: AC
  Administered 2021-10-31: 30 mL via ORAL
  Filled 2021-10-31: qty 30

## 2021-10-31 MED ORDER — IOHEXOL 350 MG/ML SOLN
100.0000 mL | Freq: Once | INTRAVENOUS | Status: AC | PRN
  Administered 2021-10-31: 100 mL via INTRAVENOUS

## 2021-10-31 NOTE — ED Triage Notes (Signed)
Patient here from home, patient had an ablation from home, started having chest pain around midnight.  Pain became worse.  Pain with a deep breath.  Patient is CAOx4.  Patient was given 1 sl nitro.  Patient refused ASA.  Patient take eliquis.  Mild shortness of breath.

## 2021-10-31 NOTE — ED Provider Notes (Signed)
[facility] EMERGENCY DEPARTMENT Provider Note  CSN: [id number] Arrival date & time: 10/31/21 7124  Chief Complaint(s) Chest Pain  HPI [name] is a 85 y.o. male    Chest Pain Pain location:  Substernal area Pain quality: radiating, sharp and stabbing   Pain radiates to:  Neck and epigastrium Pain severity:  Severe Onset quality:  Sudden Duration:  1 hour Timing:  Constant Progression:  Waxing and waning Chronicity:  New Context comment:  Woke him up from sleep. Had AF ablation yesterday. Relieved by:  Nothing Worsened by:  Nothing Associated symptoms: no back pain, no cough, no fatigue, no fever, no headache, no heartburn, no nausea and no vomiting    Past Medical History Past Medical History:  Diagnosis Date   Arthritis    Atrial fibrillation ([place])    Cancer ([place])    prostate   Dysrhythmia    afib   History of kidney stones    Patient Active Problem List   Diagnosis Date Noted   Secondary hypercoagulable state ([place]) 08/27/2021   Persistent atrial fibrillation ([place]) 08/17/2021   S/P lumbar fusion 11/25/2019   Carcinoma of prostate ([place]) 09/07/2019   Lumbar radiculopathy 09/07/2019   Degeneration of lumbar intervertebral disc 09/07/2019   History of total replacement of both hip joints 08/01/2019   Iliotibial band syndrome of right side 07/27/2019   Macrocytic anemia 07/26/2019   Hypotension 07/26/2019   Bronchiectasis without complication ([place]) 58/08/9832   DOE (dyspnea on exertion) 10/19/2018   Lung nodule < 6cm on CT 10/09/2018   Occipital neuritis 08/29/2016   Bilateral hearing loss 06/22/2016   Post herpetic neuralgia 02/02/2016   Current use of long term anticoagulation 12/18/2015   Calcification of spleen 09/13/2014   Diverticulosis 09/13/2014   Bilateral renal cysts 09/13/2014   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   Hemangioma of liver 09/13/2014   Male erectile dysfunction, unspecified 09/13/2014   Osteoarthritis of  wrist 09/13/2014   Paraphimosis 09/13/2014   Polyp of colon 09/13/2014   Restless legs syndrome 09/13/2014   Spondylosis without myelopathy or radiculopathy, cervical region 09/13/2014   Urethral stricture 09/13/2014   Spondylosis without myelopathy or radiculopathy, lumbosacral region 09/13/2014   Anxiety 09/13/2014   Chest pain at rest 12/11/2011    Class: Acute   CAD (coronary artery disease), native coronary artery 12/11/2011    Class: Chronic   HTN (hypertension) 12/11/2011   Paroxysmal atrial fibrillation ([place]) 12/11/2011   Home Medication(s) Prior to Admission medications   Medication Sig Start Date End Date Taking? Authorizing Provider  colchicine 0.6 MG tablet Take one tablet by mouth twice a day for one week and then one tablet daily for one week. 10/31/21  Yes Bhagat, Bhavinkumar, PA  amiodarone (PACERONE) 200 MG tablet Take 1 tablet (200 mg total) by mouth daily. Patient not taking: No sig reported 08/31/21   Camnitz, Ocie Doyne, MD  apixaban (ELIQUIS) 5 MG TABS tablet Take 5 mg by mouth 2 (two) times daily.    [provider]  nitroGLYCERIN (NITROSTAT) 0.4 MG SL tablet Place 0.4 mg under the tongue every 5 (five) minutes as needed for chest pain.  07/25/19   [provider]  Past Surgical History Past Surgical History:  Procedure Laterality Date   Bone spur     CHOLECYSTECTOMY     HERNIA REPAIR     JOINT REPLACEMENT     kidney stones     LAMINECTOMY WITH POSTERIOR LATERAL ARTHRODESIS LEVEL 1 Right 11/25/2019   Procedure: Laminectomy and Foraminotomy  Lumbar Four-Lumbar Five - right, with instrumented fusion;  Surgeon: Eustace Moore, MD;  Location: [facility];  Service: Neurosurgery;  Laterality: Right;  Laminectomy and Foraminotomy  Lumbar Four-Lumbar Five - right, with instrumented fusion   PROSTATE SURGERY     Family  History Family History  Problem Relation Age of Onset   Hypertension Father    CAD Neg Hx    Heart failure Neg Hx    Stroke Neg Hx     Social History Social History   Tobacco Use   Smoking status: Former    Types: Cigarettes    Quit date: 12/10/1996    Years since quitting: 24.9   Smokeless tobacco: Never   Tobacco comments:    Former smoker (08/27/2021)  Vaping Use   Vaping Use: Never used  Substance Use Topics   Alcohol use: No   Drug use: No   Allergies Ciprofloxacin, Tramadol, Duloxetine, Hydrocodone-acetaminophen, Benadryl [diphenhydramine], Nisoldipine, Methocarbamol, Ondansetron hcl, and Sulfa antibiotics  Review of Systems Review of Systems  Constitutional:  Negative for fatigue and fever.  Respiratory:  Negative for cough.   Cardiovascular:  Positive for chest pain.  Gastrointestinal:  Negative for heartburn, nausea and vomiting.  Musculoskeletal:  Negative for back pain.  Neurological:  Negative for headaches.  All other systems are reviewed and are negative for acute change except as noted in the HPI  Physical Exam Vital Signs  I have reviewed the triage vital signs BP 112/69 (BP Location: Right Arm)   Pulse 62   Temp 98.3 F (36.8 C) (Oral)   Resp 18   SpO2 98%   Physical Exam Vitals reviewed.  Constitutional:      General: He is not in acute distress.    Appearance: He is well-developed. He is not diaphoretic.     Comments: In obvious discomfort  HENT:     Head: Normocephalic and atraumatic.     Nose: Nose normal.  Eyes:     General: No scleral icterus.       Right eye: No discharge.        Left eye: No discharge.     Conjunctiva/sclera: Conjunctivae normal.     Pupils: Pupils are equal, round, and reactive to light.  Cardiovascular:     Rate and Rhythm: Normal rate and regular rhythm.     Heart sounds: No murmur heard.   No friction rub. No gallop.  Pulmonary:     Effort: Pulmonary effort is normal. No respiratory distress.      Breath sounds: Normal breath sounds. No stridor. No rales.  Abdominal:     General: There is no distension.     Palpations: Abdomen is soft.     Tenderness: There is no abdominal tenderness.  Musculoskeletal:        General: No tenderness.     Cervical back: Normal range of motion and neck supple.  Skin:    General: Skin is warm and dry.     Findings: No erythema or rash.  Neurological:     Mental Status: He is alert and oriented to person, place, and time.    ED Results and Treatments Labs (all labs  ordered are listed, but only abnormal results are displayed) Labs Reviewed  BASIC METABOLIC PANEL - Abnormal; Notable for the following components:      Result Value   Glucose, Bld 112 (*)    Creatinine, Ser 1.36 (*)    Calcium 8.6 (*)    GFR, Estimated 52 (*)    All other components within normal limits  CBC - Abnormal; Notable for the following components:   WBC 14.2 (*)    RBC 2.53 (*)    Hemoglobin 8.7 (*)    HCT 26.2 (*)    MCV 103.6 (*)    MCH 34.4 (*)    RDW 24.5 (*)    nRBC 0.6 (*)    All other components within normal limits  TROPONIN I (HIGH SENSITIVITY) - Abnormal; Notable for the following components:   Troponin I (High Sensitivity) 384 (*)    All other components within normal limits  TROPONIN I (HIGH SENSITIVITY) - Abnormal; Notable for the following components:   Troponin I (High Sensitivity) 330 (*)    All other components within normal limits  RESP PANEL BY RT-PCR (FLU A&B, COVID) ARPGX2                                                                                                                         EKG  EKG Interpretation  Date/Time:  Sunday October 31 2021 03:11:12 EST Ventricular Rate:  62 PR Interval:  208 QRS Duration: 104 QT Interval:  444 QTC Calculation: 450 R Axis:   27 Text Interpretation: intermittently atrial-paced rhythm RSR' or QR pattern in V1 suggests right ventricular conduction delay Borderline ECG Confirmed by Addison Lank  [phone]) on 10/31/2021 3:18:37 AM       Radiology DG Chest 2 View  Result Date: 10/31/2021 CLINICAL DATA:  Chest pain EXAM: CHEST - 2 VIEW COMPARISON:  08/23/2021 FINDINGS: Cardiac shadow is stable. Pacing device is again seen. Lungs are hyperinflated without focal infiltrate or effusion. No bony abnormality is noted. IMPRESSION: No active cardiopulmonary disease. Electronically Signed   By: Inez Catalina M.D.   On: 10/31/2021 03:48   CT Angio Chest/Abd/Pel for Dissection W and/or Wo Contrast  Result Date: 10/31/2021 CLINICAL DATA:  86 year old male with chest and back pain. Recent cardiac ablation. Chest pain since midnight. On Eliquis. EXAM: CT ANGIOGRAPHY CHEST, ABDOMEN AND PELVIS TECHNIQUE: Non-contrast CT of the chest was initially obtained. Multidetector CT imaging through the chest, abdomen and pelvis was performed using the standard protocol during bolus administration of intravenous contrast. Multiplanar reconstructed images and MIPs were obtained and reviewed to evaluate the vascular anatomy. CONTRAST:  16mL OMNIPAQUE IOHEXOL 350 MG/ML SOLN COMPARISON:  CTA chest 10/22/2021. Chest radiographs 0331 hours today. Lumbar spine CT 06/16/2020. FINDINGS: Cardiovascular: Stable cardiomegaly, left chest cardiac pacemaker. No pericardial effusion. Tortuous thoracic aorta but negative for thoracic aortic dissection or aneurysm. Pulmonary arteries are also opacified and appear to be patent. Left upper extremity contrast injection with extensive opacification of small venous  collaterals in and around the left chest and shoulder likely due to pacemaker related subclavian/innominate vein stenosis. Mediastinum/Nodes: Negative. No mediastinal mass or lymphadenopathy. Lungs/Pleura: Lower lung volumes. Major airways remain patent. Increased dependent atelectasis. Apical lung scarring. The most confluent opacity in the left costophrenic angle is enhancing compatible with atelectasis. No pleural effusion.  Musculoskeletal: No acute osseous abnormality identified. Scoliosis and spine degeneration with occasional thoracic interbody ankylosis. Review of the MIP images confirms the above findings. CTA ABDOMEN AND PELVIS FINDINGS VASCULAR Mild Aortoiliac calcified atherosclerosis. Mild tortuosity of the abdominal aorta and iliac arteries. But no arterial dissection or aneurysm. Major arterial structures appear patent. Review of the MIP images confirms the above findings. NON-VASCULAR Hepatobiliary: Absent gallbladder.  Otherwise negative liver. Pancreas: Negative. Spleen: Negative. Adrenals/Urinary Tract: Normal adrenal glands. Nonobstructed kidneys. Exophytic left renal cysts with simple fluid density appears benign. Bilateral renal vascular calcifications versus nephrolithiasis. Smaller exophytic right renal cyst. Negative visible ureters. Streak artifact in the pelvis from bilateral hip arthroplasty obscures much of the bladder detail. Stomach/Bowel: Extensive diverticulosis of the large bowel. Sigmoid colon most affected. Exophytic gas containing diverticula (such as on series 6, image 256), but no active inflammation or pneumoperitoneum identified. Normal appendix on series 6, image 276. Negative terminal ileum. No dilated small bowel. Decompressed stomach. Negative duodenum. No free fluid identified. Lymphatic: No lymphadenopathy. Reproductive: Penile implant.  Otherwise negative. Other: Streak artifact in the pelvis from hip arthroplasty. No pelvic free fluid is visible. Musculoskeletal: Chronic lower lumbar fusion and superimposed advanced chronic lower lumbar degeneration with chronic L2-L3 ankylosis. Previous bilateral hip arthroplasty. No acute osseous abnormality identified. Review of the MIP images confirms the above findings. IMPRESSION: 1. Negative for aortic aneurysm or dissection. No acute or inflammatory process identified in the chest, abdomen, or pelvis. 2. Aortic Atherosclerosis (ICD10-I70.0).  Cardiomegaly. 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 Diverticulosis of the large bowel, cholecystectomy, penile implant, hip arthroplasty. Electronically Signed   By: Genevie Ann M.D.   On: 10/31/2021 07:08    Pertinent labs & imaging results that were available during my care of the patient were reviewed by me and considered in my medical decision making (see MDM for details).  Medications Ordered in ED Medications  fentaNYL (SUBLIMAZE) 100 MCG/2ML injection (  Not Given 10/31/21 0714)  colchicine tablet 0.6 mg (has no administration in time range)  fentaNYL (SUBLIMAZE) injection 75 mcg (75 mcg Intravenous Given 10/31/21 0525)  fentaNYL (SUBLIMAZE) injection 25 mcg (25 mcg Intravenous Given 10/31/21 0700)  iohexol (OMNIPAQUE) 350 MG/ML injection 100 mL (100 mLs Intravenous Contrast Given 10/31/21 0635)  alum & mag hydroxide-simeth (MAALOX/MYLANTA) 200-200-20 MG/5ML suspension 30 mL (30 mLs Oral Given 10/31/21 0752)                                                                                                                                     Procedures Procedures  (including critical care time)  Medical Decision Making / ED Course I have reviewed the nursing notes for this encounter and the patient's prior records (if available in EHR or on provided paperwork).  Todd Salazar was evaluated in Emergency Department on 10/31/2021 for the symptoms described in the history of present illness. He was evaluated in the context of the global COVID-19 pandemic, which necessitated consideration that the patient might be at risk for infection with the SARS-CoV-2 virus that causes COVID-19. Institutional protocols and algorithms that pertain to the evaluation of patients at risk for COVID-19 are in a state of rapid change based on information released by regulatory bodies including the CDC and federal and state organizations. These policies and algorithms were followed during the  patient's care in the ED.     Chest pain Given clinical picture and recent ablation considering ACS, pericarditis, dissection, pericardial effusion EKG with no acute ischemic changes. Initial trop up. Likely from ablation. POCUS w/o pericardial effusion. Hyperechogenic area in aorta. Will move forward with CTA for dissection. 2nd trop down trending - consistent with recent ablation. ACS unlikely. Chest x-ray without evidence suggestive of pneumonia, pneumothorax, pneumomediastinum.  No abnormal contour of the mediastinum to suggest dissection. No evidence of acute injuries. CTA negative for dissection or PE. Pain improved with fentanyl. Cardiology consulted and saw patient in ED. Recommended Colchicine for likely pericarditis. Will monitor for additional 1 hr and DC if stable    Pertinent labs & imaging results that were available during my care of the patient were reviewed by me and considered in my medical decision making:    Final Clinical Impression(s) / ED Diagnoses Final diagnoses:  Other acute pericarditis     This chart was dictated using voice recognition software.  Despite best efforts to proofread,  errors can occur which can change the documentation meaning.    Fatima Blank, MD 10/31/21 [phone]

## 2021-10-31 NOTE — Discharge Instructions (Addendum)
Take the colchicine as directed.  Continue all your current medications.  Cardiology should be calling you for follow-up.  If you do not hear anything from them on Monday give them a call.  Turn for any new or worse symptoms.

## 2021-10-31 NOTE — ED Provider Notes (Signed)
Emergency Medicine Provider Triage Evaluation Note  Todd Salazar , a 85 y.o. male  was evaluated in triage.  Pt complains of CP.  Recent ablation for a-fib.  On Eliquis.  Pain awakened him from sleep tonight.  Worse with deep breathing.  Mild SOB.  Review of Systems  Positive: Cp, SOB Negative: Fever, chills, cough  Physical Exam  BP 109/61 (BP Location: Right Arm)   Pulse 63   Temp 98.3 F (36.8 C) (Oral)   Resp 19   SpO2 99%  Gen:   Awake, no distress   Resp:  Normal effort  MSK:   Moves extremities without difficulty  Other:    Medical Decision Making  Medically screening exam initiated at 3:16 AM.  Appropriate orders placed.  [name] was informed that the remainder of the evaluation will be completed by another provider, this initial triage assessment does not replace that evaluation, and the importance of remaining in the ED until their evaluation is complete.  CP   Montine Circle, PA-C 10/31/21 [place], MD 11/01/21 [phone]

## 2021-10-31 NOTE — Consult Note (Signed)
Cardiology Consultation:   Patient ID: Todd Salazar MRN: [id number]; DOB: 1936/12/12  Admit date: 10/31/2021 Date of Consult: 10/31/2021  PCP:  Todd Oman, DO   [facility] Providers Cardiologist:   Curt Bears       Patient Profile:   Todd Salazar is a 85 y.o. male with a hx of atrial fibrillation post ablation who is being seen 10/31/2021 for the evaluation of chest pain at the request of Montine Circle.  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  He is status post atrial fibrillation ablation which occurred 10/29/2021.  He felt well yesterday, but woke up at 1:00 this morning with pain in the center of his chest that radiated to his neck.  He states that the pain was worse when lying flat and also worse when taking a deep breath.  He came to the emergency room and had a CT of the chest which was negative for dissection.  He continues to have pain in the emergency room, though it has improved.   Past Medical History:  Diagnosis Date   Arthritis    Atrial fibrillation ([place])    Cancer ([place])    prostate   Dysrhythmia    afib   History of kidney stones     Past Surgical History:  Procedure Laterality Date   Bone spur     CHOLECYSTECTOMY     HERNIA REPAIR     JOINT REPLACEMENT     kidney stones     LAMINECTOMY WITH POSTERIOR LATERAL ARTHRODESIS LEVEL 1 Right 11/25/2019   Procedure: Laminectomy and Foraminotomy  Lumbar Four-Lumbar Five - right, with instrumented fusion;  Surgeon: Eustace Moore, MD;  Location: [facility];  Service: Neurosurgery;  Laterality: Right;  Laminectomy and Foraminotomy  Lumbar Four-Lumbar Five - right, with instrumented fusion   PROSTATE SURGERY       Home Medications:  Prior to Admission medications   Medication Sig Start Date End Date Taking? Authorizing Provider   amiodarone (PACERONE) 200 MG tablet Take 1 tablet (200 mg total) by mouth daily. Patient not taking: No sig reported 08/31/21   Hurbert Duran, Ocie Doyne, MD  apixaban (ELIQUIS) 5 MG TABS tablet Take 5 mg by mouth 2 (two) times daily.    [provider]  nitroGLYCERIN (NITROSTAT) 0.4 MG SL tablet Place 0.4 mg under the tongue every 5 (five) minutes as needed for chest pain.  07/25/19   [provider]    Inpatient Medications: Scheduled Meds:  alum & mag hydroxide-simeth  30 mL Oral Once   colchicine  0.6 mg Oral Once   fentaNYL       Continuous Infusions:  PRN Meds:   Allergies:    Allergies  Allergen Reactions   Ciprofloxacin Anaphylaxis    Dizziness    Tramadol Swelling    Laryngeal edema   Duloxetine Other (See Comments)    delusions   Hydrocodone-Acetaminophen Nausea And Vomiting   Benadryl [Diphenhydramine] Other (See Comments)    Panic attack    Nisoldipine Other (See Comments)    Pt unsure of reaction    Methocarbamol Nausea Only   Ondansetron Hcl Nausea Only   Sulfa Antibiotics Rash    Social History:   Social History   Socioeconomic History   Marital status: Widowed    Spouse name: Not on file   Number of children: Not  on file   Years of education: Not on file   Highest education level: Not on file  Occupational History   Not on file  Tobacco Use   Smoking status: Former    Types: Cigarettes    Quit date: 12/10/1996    Years since quitting: 24.9   Smokeless tobacco: Never   Tobacco comments:    Former smoker (08/27/2021)  Vaping Use   Vaping Use: Never used  Substance and Sexual Activity   Alcohol use: No   Drug use: No   Sexual activity: Never  Other Topics Concern   Not on file  Social History Narrative   Not on file   Social Determinants of Health   Financial Resource Strain: Not on file  Food Insecurity: Not on file  Transportation Needs: Not on file  Physical Activity: Not on file  Stress: Not on file  Social  Connections: Not on file  Intimate Partner Violence: Not on file    Family History:    Family History  Problem Relation Age of Onset   Hypertension Father    CAD Neg Hx    Heart failure Neg Hx    Stroke Neg Hx      ROS:  Please see the history of present illness.   All other ROS reviewed and negative.     Physical Exam/Data:   Vitals:   10/31/21 0313 10/31/21 0315 10/31/21 0528  BP: 109/61  112/69  Pulse: 63  62  Resp: 19  18  Temp:  98.3 F (36.8 C)   TempSrc:  Oral   SpO2: 99%  98%   No intake or output data in the 24 hours ending 10/31/21 0752 Last 3 Weights 10/29/2021 08/31/2021 08/27/2021  Weight (lbs) 187 lb 186 lb 187 lb 9.6 oz  Weight (kg) 84.823 kg 84.369 kg 85.095 kg     There is no height or weight on file to calculate BMI.  General:  Well nourished, well developed, in no acute distress HEENT: normal Neck: no JVD Vascular: No carotid bruits; Distal pulses 2+ bilaterally Cardiac:  normal S1, S2; RRR; no murmur  Lungs:  clear to auscultation bilaterally, no wheezing, rhonchi or rales  Abd: soft, nontender, no hepatomegaly  Ext: no edema Musculoskeletal:  No deformities, BUE and BLE strength normal and equal Skin: warm and dry  Neuro:  CNs 2-12 intact, no focal abnormalities noted Psych:  Normal affect   EKG:  The EKG was personally reviewed and demonstrates: Sinus rhythm with intermittent atrial pacing Telemetry:  Telemetry was personally reviewed and demonstrates: Sinus rhythm, intermittent atrial pacing  Relevant CV Studies: TTE 08/19/2021  1. Left ventricular ejection fraction, by estimation, is 55 to 60%. The  left ventricle has normal function. The left ventricle has no regional  wall motion abnormalities. There is mild left ventricular hypertrophy.  Left ventricular diastolic parameters  were normal.   2. Right ventricular systolic function is normal. The right ventricular  size is mildly enlarged. There is normal pulmonary artery systolic   pressure. The estimated right ventricular systolic pressure is 41.2 mmHg.   3. Right atrial size was mildly dilated.   4. The mitral valve is normal in structure. Trivial mitral valve  regurgitation.   5. Tricuspid valve regurgitation is mild to moderate.   6. The aortic valve is tricuspid. Aortic valve regurgitation is not  visualized. No aortic stenosis is present.   7. The inferior vena cava is normal in size with greater than 50%  respiratory variability,  suggesting right atrial pressure of 3 mmHg.   Laboratory Data:  High Sensitivity Troponin:   Recent Labs  Lab 10/31/21 0334 10/31/21 0552  TROPONINIHS 384* 330*     Chemistry Recent Labs  Lab 10/31/21 0334  NA 139  K 4.7  CL 109  CO2 23  GLUCOSE 112*  BUN 20  CREATININE 1.36*  CALCIUM 8.6*  GFRNONAA 52*  ANIONGAP 7    No results for input(s): PROT, ALBUMIN, AST, ALT, ALKPHOS, BILITOT in the last 168 hours. Lipids No results for input(s): CHOL, TRIG, HDL, LABVLDL, LDLCALC, CHOLHDL in the last 168 hours.  Hematology Recent Labs  Lab 10/31/21 0334  WBC 14.2*  RBC 2.53*  HGB 8.7*  HCT 26.2*  MCV 103.6*  MCH 34.4*  MCHC 33.2  RDW 24.5*  PLT 261   Thyroid No results for input(s): TSH, FREET4 in the last 168 hours.  BNPNo results for input(s): BNP, PROBNP in the last 168 hours.  DDimer No results for input(s): DDIMER in the last 168 hours.   Radiology/Studies:  DG Chest 2 View  Result Date: 10/31/2021 CLINICAL DATA:  Chest pain EXAM: CHEST - 2 VIEW COMPARISON:  08/23/2021 FINDINGS: Cardiac shadow is stable. Pacing device is again seen. Lungs are hyperinflated without focal infiltrate or effusion. No bony abnormality is noted. IMPRESSION: No active cardiopulmonary disease. Electronically Signed   By: Inez Catalina M.D.   On: 10/31/2021 03:48   EP STUDY  Result Date: 10/29/2021 SURGEON:  Allegra Lai, MD PREPROCEDURE DIAGNOSES: 1. Persistent atrial fibrillation. POSTPROCEDURE DIAGNOSES: 1. Persistent  atrial fibrillation. PROCEDURES: 1. Comprehensive electrophysiologic study. 2. Coronary sinus pacing and recording. 3. Three-dimensional mapping of atrial fibrillation (with additional mapping and ablation within the left atrium due to persistence of afib) 4. Ablation of atrial fibrillation (with additional mapping and ablation within the left atrium due to persistence of afib) 5. Intracardiac echocardiography. 6. Transseptal puncture of an intact septum. 7. Arrhythmia induction with pacing INTRODUCTION:  Pedram Goodchild is a 85 y.o. male with a history of persistent atrial fibrillation who now presents for EP study and radiofrequency ablation.  The patient reports initially being diagnosed with atrial fibrillation after presenting with symptomatic palpitations and fatgiue.  The patient has failed medical therapy.  The patient therefore presents today for catheter ablation of atrial fibrillation. DESCRIPTION OF PROCEDURE:  Informed written consent was obtained, and the patient was brought to the electrophysiology lab in a fasting state.  The patient was adequately sedated with intravenous medications as outlined in the anesthesia report.  The patient's left and right groins were prepped and draped in the usual sterile fashion by the EP lab staff.  Using a percutaneous Seldinger technique, two 7-French and one 11-French hemostasis sheaths were placed into the right common femoral vein.  An esophageal temperature probe was inserted to monitor for heating of the esophagus during the procedure. Direct ultrasound guidance is used for right and left femoral veins with normal vessel patency. Ultrasound images are captured and stored in the patient's chart. Using ultrasound guidance, the Brockenbrough needle and wire were visualized entering the vessel. Catheter Placement:  A 7-French [employer] Decapolar coronary sinus catheter was introduced through the right common femoral vein and advanced into the coronary sinus for  recording and pacing from this location.    A luminal esophageal temperature probe was placed and used for continuous monitoring of the luminal esophageal temperature throughout the procedure as well as to localize the esophagus on fluoroscopy. In addition, the esophagus was  directly visualized with intracardiac echo and its positioned marked on Carto.  During ablation at the posterior wall there was limited esophageal heating noted during RF energy delivery with the maximal temperature recorded by the luminal temperature probe of < 38.5 degrees C. Initial Measurements: The patient presented to the electrophysiology lab in sinus rhythm.   The average RR interval measured 903 msec.   Intracardiac Echocardiography: A 10-French Biosense Webster AcuNav intracardiac echocardiography catheter was introduced through the right common femoral vein and advanced into the right atrium. Intracardiac echocardiography was performed of the left atrium, and a three-dimensional anatomical rendering of the left atrium was performed using CARTO sound technology.  The patient was noted to have a moderate sized left atrium.  The interatrial septum was prominent but not aneurysmal. All 4 pulmonary veins were visualized and noted to have separate ostia.  The pulmonary veins were moderate in size.  The left atrial appendage was visualized and did not reveal thrombus.   There was no evidence of pulmonary vein stenosis. Transseptal Puncture: The right common femoral vein sheaths were was exchanged for an 8.5 [place] Agillis transseptal sheath and transseptal access was achieved in a standard fashion using a Brockenbrough needle under biplane fluoroscopy with intracardiac echocardiography confirmation of the transseptal puncture.  Once transseptal access had been achieved, heparin was administered intravenously and intra- arterially in order to maintain an ACT of greater than 350 seconds throughout the procedure.  3D Mapping and Ablation: A 3.5  mm [employer]ense [employer] Thermocool ablation catheter was advanced into the right atrium.  The transseptal sheath was pulled back into the IVC over a guidewire.  The ablation catheter was advanced across the transseptal hole using the wire as a guide.  The transseptal sheath was then re-advanced over the guidewire into the left atrium.  A duodecapolar Biosense Webster pentarray mapping catheter was introduced through the transseptal sheath and positioned over the mouth of all 4 pulmonary veins.  Three-dimensional electroanatomical mapping was performed using CARTO technology.  This demonstrated electrical activity within the right sided pulmonary veins at baseline. The patient underwent successful sequential electrical isolation and anatomical encircling of the right sided pulmonary veins using radiofrequency current with a circular mapping catheter as a guide. A WACA approach was used. Due to persistence of atrial fibrillation, additional left atrial mapping and ablation was performed.  A series of radiofrequency lesions were delivered along the roof and floor of the left atrium in order to create a "standard box" lesion along the posterior wall of the left atrium. 20 mcg/kg/min of dobutamine was infused without arrhythmia induced. Measurements Following Ablation: The patient was AV paced post procedure through his pacemaker and thus no further pacing was performed. Electroisolation was then again confirmed in all four pulmonary veins. The procedure was therefore considered completed.  All catheters were removed, and the sheaths were aspirated and flushed.  The patient was transferred to the recovery area for sheath removal per protocol.  Intracardiac echocardiogram revealed no pericardial effusion. EBL<53ml.  There were no early apparent complications. CONCLUSIONS: 1. Atrial fibrillation upon presentation.  2. Successful electrical isolation and anatomical encircling of all four pulmonary veins with  radiofrequency current.  A WACA approach was used 3. Additional left atrial ablation was performed with a standard box lesion created along the posterior wall of the left atrium 4. No early apparent complications. Ocie Doyne Faryn Sieg,MD 9:46 AM 10/29/2021   CT Angio Chest/Abd/Pel for Dissection W and/or Wo Contrast  Result Date: 10/31/2021 CLINICAL DATA:  85 year old male with chest and back pain. Recent cardiac ablation. Chest pain since midnight. On Eliquis. EXAM: CT ANGIOGRAPHY CHEST, ABDOMEN AND PELVIS TECHNIQUE: Non-contrast CT of the chest was initially obtained. Multidetector CT imaging through the chest, abdomen and pelvis was performed using the standard protocol during bolus administration of intravenous contrast. Multiplanar reconstructed images and MIPs were obtained and reviewed to evaluate the vascular anatomy. CONTRAST:  120mL OMNIPAQUE IOHEXOL 350 MG/ML SOLN COMPARISON:  CTA chest 10/22/2021. Chest radiographs 0331 hours today. Lumbar spine CT 06/16/2020. FINDINGS: Cardiovascular: Stable cardiomegaly, left chest cardiac pacemaker. No pericardial effusion. Tortuous thoracic aorta but negative for thoracic aortic dissection or aneurysm. Pulmonary arteries are also opacified and appear to be patent. Left upper extremity contrast injection with extensive opacification of small venous collaterals in and around the left chest and shoulder likely due to pacemaker related subclavian/innominate vein stenosis. Mediastinum/Nodes: Negative. No mediastinal mass or lymphadenopathy. Lungs/Pleura: Lower lung volumes. Major airways remain patent. Increased dependent atelectasis. Apical lung scarring. The most confluent opacity in the left costophrenic angle is enhancing compatible with atelectasis. No pleural effusion. Musculoskeletal: No acute osseous abnormality identified. Scoliosis and spine degeneration with occasional thoracic interbody ankylosis. Review of the MIP images confirms the above findings. CTA  ABDOMEN AND PELVIS FINDINGS VASCULAR Mild Aortoiliac calcified atherosclerosis. Mild tortuosity of the abdominal aorta and iliac arteries. But no arterial dissection or aneurysm. Major arterial structures appear patent. Review of the MIP images confirms the above findings. NON-VASCULAR Hepatobiliary: Absent gallbladder.  Otherwise negative liver. Pancreas: Negative. Spleen: Negative. Adrenals/Urinary Tract: Normal adrenal glands. Nonobstructed kidneys. Exophytic left renal cysts with simple fluid density appears benign. Bilateral renal vascular calcifications versus nephrolithiasis. Smaller exophytic right renal cyst. Negative visible ureters. Streak artifact in the pelvis from bilateral hip arthroplasty obscures much of the bladder detail. Stomach/Bowel: Extensive diverticulosis of the large bowel. Sigmoid colon most affected. Exophytic gas containing diverticula (such as on series 6, image 256), but no active inflammation or pneumoperitoneum identified. Normal appendix on series 6, image 276. Negative terminal ileum. No dilated small bowel. Decompressed stomach. Negative duodenum. No free fluid identified. Lymphatic: No lymphadenopathy. Reproductive: Penile implant.  Otherwise negative. Other: Streak artifact in the pelvis from hip arthroplasty. No pelvic free fluid is visible. Musculoskeletal: Chronic lower lumbar fusion and superimposed advanced chronic lower lumbar degeneration with chronic L2-L3 ankylosis. Previous bilateral hip arthroplasty. No acute osseous abnormality identified. Review of the MIP images confirms the above findings. IMPRESSION: 1. Negative for aortic aneurysm or dissection. No acute or inflammatory process identified in the chest, abdomen, or pelvis. 2. Aortic Atherosclerosis (ICD10-I70.0). Cardiomegaly. 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 Diverticulosis of the large bowel, cholecystectomy, penile implant, hip arthroplasty. Electronically Signed   By: Genevie Ann  M.D.   On: 10/31/2021 07:08     Assessment and Plan:   Persistent atrial fibrillation: Patient is status post ablation.  He remains in sinus rhythm.  We Ines Rebel continue anticoagulation with Eliquis 5 mg twice daily and amiodarone 200 mg daily. Acute pericarditis: Likely a complication of his atrial fibrillation ablation.  Fortunately CT of the chest shows no aortic dissection.  We Amillya Chavira start him on colchicine 0.6 mg daily.  After he gets a dose in the emergency room, would likely be able to be discharged.  We Pau Banh call a prescription into his pharmacy.  He already has follow-up arranged in cardiology clinic.   Risk Assessment/Risk Scores:          CHA2DS2-VASc Score = 2   This  indicates a 2.2% annual risk of stroke. The patient's score is based upon: CHF History: 0 HTN History: 0 Diabetes History: 0 Stroke History: 0 Vascular Disease History: 0 Age Score: 2 Gender Score: 0      For questions or updates, please contact [place] Please consult [email or web address] for contact info under    Signed, Florance Paolillo Meredith Leeds, MD  10/31/2021 7:52 AM

## 2021-11-01 ENCOUNTER — Encounter (HOSPITAL_COMMUNITY): Payer: Self-pay | Admitting: Cardiology

## 2021-11-01 LAB — POCT ACTIVATED CLOTTING TIME
Activated Clotting Time: 242 seconds
Activated Clotting Time: 265 seconds
Activated Clotting Time: 306 seconds

## 2021-11-01 MED FILL — Lidocaine Inj 1% w/ Epinephrine-1:100000: INTRAMUSCULAR | Qty: 20 | Status: AC

## 2021-11-25 DIAGNOSIS — L570 Actinic keratosis: Secondary | ICD-10-CM | POA: Diagnosis not present

## 2021-11-25 DIAGNOSIS — D225 Melanocytic nevi of trunk: Secondary | ICD-10-CM | POA: Diagnosis not present

## 2021-11-25 DIAGNOSIS — L578 Other skin changes due to chronic exposure to nonionizing radiation: Secondary | ICD-10-CM | POA: Diagnosis not present

## 2021-11-26 ENCOUNTER — Ambulatory Visit (HOSPITAL_COMMUNITY)
Admission: RE | Admit: 2021-11-26 | Discharge: 2021-11-26 | Disposition: A | Discharge status: Home / Self Care | Payer: PPO | Source: Ambulatory Visit | Attending: Physician Assistant | Admitting: Physician Assistant

## 2021-11-26 ENCOUNTER — Other Ambulatory Visit: Payer: Self-pay

## 2021-11-26 VITALS — BP 130/66 | HR 69 | Ht 75.0 in | Wt 190.6 lb

## 2021-11-26 DIAGNOSIS — Z8546 Personal history of malignant neoplasm of prostate: Secondary | ICD-10-CM | POA: Insufficient documentation

## 2021-11-26 DIAGNOSIS — Z79899 Other long term (current) drug therapy: Secondary | ICD-10-CM | POA: Diagnosis not present

## 2021-11-26 DIAGNOSIS — R42 Dizziness and giddiness: Secondary | ICD-10-CM | POA: Diagnosis not present

## 2021-11-26 DIAGNOSIS — J479 Bronchiectasis, uncomplicated: Secondary | ICD-10-CM | POA: Insufficient documentation

## 2021-11-26 DIAGNOSIS — I471 Supraventricular tachycardia: Secondary | ICD-10-CM | POA: Diagnosis not present

## 2021-11-26 DIAGNOSIS — N189 Chronic kidney disease, unspecified: Secondary | ICD-10-CM | POA: Diagnosis not present

## 2021-11-26 DIAGNOSIS — I951 Orthostatic hypotension: Secondary | ICD-10-CM | POA: Diagnosis not present

## 2021-11-26 DIAGNOSIS — R001 Bradycardia, unspecified: Secondary | ICD-10-CM | POA: Insufficient documentation

## 2021-11-26 DIAGNOSIS — Z7901 Long term (current) use of anticoagulants: Secondary | ICD-10-CM | POA: Insufficient documentation

## 2021-11-26 DIAGNOSIS — J984 Other disorders of lung: Secondary | ICD-10-CM | POA: Insufficient documentation

## 2021-11-26 DIAGNOSIS — Z950 Presence of cardiac pacemaker: Secondary | ICD-10-CM | POA: Insufficient documentation

## 2021-11-26 DIAGNOSIS — D6869 Other thrombophilia: Secondary | ICD-10-CM

## 2021-11-26 DIAGNOSIS — I4819 Other persistent atrial fibrillation: Secondary | ICD-10-CM

## 2021-11-26 LAB — CBC
HCT: 28.2 % — ABNORMAL LOW (ref 39.0–52.0)
Hemoglobin: 9.2 g/dL — ABNORMAL LOW (ref 13.0–17.0)
MCH: 33.7 pg (ref 26.0–34.0)
MCHC: 32.6 g/dL (ref 30.0–36.0)
MCV: 103.3 fL — ABNORMAL HIGH (ref 80.0–100.0)
Platelets: 272 10*3/uL (ref 150–400)
RBC: 2.73 MIL/uL — ABNORMAL LOW (ref 4.22–5.81)
RDW: 24 % — ABNORMAL HIGH (ref 11.5–15.5)
WBC: 4.5 10*3/uL (ref 4.0–10.5)
nRBC: 0.4 % — ABNORMAL HIGH (ref 0.0–0.2)

## 2021-11-26 NOTE — Progress Notes (Signed)
Primary Care Physician: [name], DO Primary Cardiologist: Dr Minna Merritts Primary Electrophysiologist: Dr Curt Bears  Referring Physician: Dr Guido Sander Todd Salazar is a 85 y.o. male with a history of bronchiectasis/restrictive lung disease, symptomatic bradycardia s/p PPM, orthostatic hypotension, prostate cancer, CKD, atrial tachycardia, atrial fibrillation who presents for follow up in the [facility]. Patient is on Eliquis for a CHADS2VASC score of 2. Patient recently hospitalized for afib with RVR and loaded on dofetilide. He converted with the medication and did not require DCCV. However, he presented to the ED 08/23/21 with  rapid afib. He was about to undergo DCCV when he spontaneously converted to SR.   On follow up today, patient is s/p afib ablation with Dr Curt Bears on 10/29/21. He did present to the ED 10/31/21 with CP and was diagnosed with pericarditis, started on colchicine. His CP has resolved. He does report intermittent lightheadedness and "jittery" feeling. He is in SR today. No longer on amiodarone.  Today, he denies symptoms of palpitations, chest pain, shortness of breath, orthopnea, PND, lower extremity edema, presyncope, syncope, snoring, daytime somnolence, bleeding, or neurologic sequela. The patient is tolerating medications without difficulties and is otherwise without complaint today.    Atrial Fibrillation Risk Factors:  he does not have symptoms or diagnosis of sleep apnea. he does not have a history of rheumatic fever.   he has a BMI of Body mass index is 23.82 kg/m.Marland Kitchen Filed Weights   11/26/21 1110  Weight: 86.5 kg     Family History  Problem Relation Age of Onset   Hypertension Father    CAD Neg Hx    Heart failure Neg Hx    Stroke Neg Hx      Atrial Fibrillation Management history:  Previous antiarrhythmic drugs: flecainide, sotalol, propafenone, amiodarone  Previous cardioversions: none Previous ablations:  10/29/21 CHADS2VASC score: 2 Anticoagulation history: Eliquis   Past Medical History:  Diagnosis Date   Arthritis    Atrial fibrillation ([place])    Cancer ([place])    prostate   Dysrhythmia    afib   History of kidney stones    Past Surgical History:  Procedure Laterality Date   ATRIAL FIBRILLATION ABLATION N/A 10/29/2021   Procedure: ATRIAL FIBRILLATION ABLATION;  Surgeon: Constance Haw, MD;  Location: [facility];  Service: Cardiovascular;  Laterality: N/A;   Bone spur     CHOLECYSTECTOMY     HERNIA REPAIR     JOINT REPLACEMENT     kidney stones     LAMINECTOMY WITH POSTERIOR LATERAL ARTHRODESIS LEVEL 1 Right 11/25/2019   Procedure: Laminectomy and Foraminotomy  Lumbar Four-Lumbar Five - right, with instrumented fusion;  Surgeon: Eustace Moore, MD;  Location: [facility];  Service: Neurosurgery;  Laterality: Right;  Laminectomy and Foraminotomy  Lumbar Four-Lumbar Five - right, with instrumented fusion   PROSTATE SURGERY      Current Outpatient Medications  Medication Sig Dispense Refill   apixaban (ELIQUIS) 5 MG TABS tablet Take 5 mg by mouth 2 (two) times daily.     nitroGLYCERIN (NITROSTAT) 0.4 MG SL tablet Place 0.4 mg under the tongue every 5 (five) minutes as needed for chest pain.      No current facility-administered medications for this encounter.    Allergies  Allergen Reactions   Ciprofloxacin Anaphylaxis    Dizziness    Tramadol Swelling    Laryngeal edema   Duloxetine Other (See Comments)    delusions   Hydrocodone-Acetaminophen Nausea  And Vomiting   Benadryl [Diphenhydramine] Other (See Comments)    Panic attack    Nisoldipine Other (See Comments)    Pt unsure of reaction    Methocarbamol Nausea Only   Ondansetron Hcl Nausea Only   Sulfa Antibiotics Rash    Social History   Socioeconomic History   Marital status: Widowed    Spouse name: Not on file   Number of children: Not on file   Years of education: Not on file   Highest education  level: Not on file  Occupational History   Not on file  Tobacco Use   Smoking status: Former    Types: Cigarettes    Quit date: 12/10/1996    Years since quitting: 24.9   Smokeless tobacco: Never   Tobacco comments:    Former smoker (08/27/2021)  Vaping Use   Vaping Use: Never used  Substance and Sexual Activity   Alcohol use: No   Drug use: No   Sexual activity: Never  Other Topics Concern   Not on file  Social History Narrative   Not on file   Social Determinants of Health   Financial Resource Strain: Not on file  Food Insecurity: Not on file  Transportation Needs: Not on file  Physical Activity: Not on file  Stress: Not on file  Social Connections: Not on file  Intimate Partner Violence: Not on file     ROS- All systems are reviewed and negative except as per the HPI above.  Physical Exam: Vitals:   11/26/21 1110  BP: 130/66  Pulse: 69  Weight: 86.5 kg  Height: 6\' 3"  (1.905 m)    GEN- The patient is a well appearing elderly male, alert and oriented x 3 today.   HEENT-head normocephalic, atraumatic, sclera clear, conjunctiva pink, hearing intact, trachea midline. Lungs- Clear to ausculation bilaterally, normal work of breathing Heart- Regular rate and rhythm, no murmurs, rubs or gallops  GI- soft, NT, ND, + BS Extremities- no clubbing, cyanosis, or edema MS- no significant deformity or atrophy Skin- no rash or lesion Psych- euthymic mood, full affect Neuro- strength and sensation are intact   Wt Readings from Last 3 Encounters:  11/26/21 86.5 kg  10/29/21 84.8 kg  08/31/21 84.4 kg    EKG today demonstrates  SR Vent. rate 69 BPM PR interval 208 ms QRS duration 100 ms QT/QTcB 412/441 ms  Echo 08/19/21 demonstrated  1. Left ventricular ejection fraction, by estimation, is 55 to 60%. The  left ventricle has normal function. The left ventricle has no regional  wall motion abnormalities. There is mild left ventricular hypertrophy.  Left ventricular  diastolic parameters were normal.   2. Right ventricular systolic function is normal. The right ventricular  size is mildly enlarged. There is normal pulmonary artery systolic  pressure. The estimated right ventricular systolic pressure is 08.6 mmHg.   3. Right atrial size was mildly dilated.   4. The mitral valve is normal in structure. Trivial mitral valve  regurgitation.   5. Tricuspid valve regurgitation is mild to moderate.   6. The aortic valve is tricuspid. Aortic valve regurgitation is not  visualized. No aortic stenosis is present.   7. The inferior vena cava is normal in size with greater than 50%  respiratory variability, suggesting right atrial pressure of 3 mmHg.   Epic records are reviewed at length today  CHA2DS2-VASc Score = 2  The patient's score is based upon: CHF History: 0 HTN History: 0 Diabetes History: 0 Stroke History: 0  Vascular Disease History: 0 Age Score: 2 Gender Score: 0       ASSESSMENT AND PLAN: 1. Persistent Atrial Fibrillation (ICD10:  I48.19) The patient's CHA2DS2-VASc score is 2, indicating a 2.2% annual risk of stroke.   S/p dofetilide loading with frequent symptomatic breakthrough episodes of afib. Recall he has not tolerated rate control in the past due to hypotension. Now s/p afib ablation 10/29/21 Patient in [place] today. ? If dizziness related to paroxysms of afib post ablation. Will see what remote transmission shows on 12/09/21. Will check cbc today to make sure his baseline anemia is not worse.  Continue Eliquis 5 mg BID with no missed doses for 3 months post ablation.   2. Secondary Hypercoagulable State (ICD10:  D68.69) The patient is at significant risk for stroke/thromboembolism based upon his CHA2DS2-VASc Score of 2.  Continue Apixaban (Eliquis).    Follow up with Dr Curt Bears as scheduled.    [facility] [address] [place], [place] [address] [phone] 11/26/2021 11:40 AM

## 2021-12-09 ENCOUNTER — Ambulatory Visit (INDEPENDENT_AMBULATORY_CARE_PROVIDER_SITE_OTHER): Payer: PPO

## 2021-12-09 DIAGNOSIS — I4819 Other persistent atrial fibrillation: Secondary | ICD-10-CM | POA: Diagnosis not present

## 2021-12-09 LAB — CUP PACEART REMOTE DEVICE CHECK
Battery Remaining Longevity: 105 mo
Battery Remaining Percentage: 90 %
Battery Voltage: 3.01 V
Brady Statistic AP VP Percent: 1 %
Brady Statistic AP VS Percent: 38 %
Brady Statistic AS VP Percent: 1 %
Brady Statistic AS VS Percent: 60 %
Brady Statistic RA Percent Paced: 38 %
Brady Statistic RV Percent Paced: 1 %
Date Time Interrogation Session: 20221229121341
Implantable Lead Implant Date: 20211011
Implantable Lead Implant Date: 20211011
Implantable Lead Location: 753859
Implantable Lead Location: 753860
Implantable Pulse Generator Implant Date: 20211011
Lead Channel Impedance Value: 360 Ohm
Lead Channel Impedance Value: 490 Ohm
Lead Channel Pacing Threshold Amplitude: 0.75 V
Lead Channel Pacing Threshold Amplitude: 1 V
Lead Channel Pacing Threshold Pulse Width: 0.5 ms
Lead Channel Pacing Threshold Pulse Width: 0.5 ms
Lead Channel Sensing Intrinsic Amplitude: 1.5 mV
Lead Channel Sensing Intrinsic Amplitude: 7.2 mV
Lead Channel Setting Pacing Amplitude: 2 V
Lead Channel Setting Pacing Amplitude: 2 V
Lead Channel Setting Pacing Pulse Width: 0.5 ms
Lead Channel Setting Sensing Sensitivity: 2 mV
Pulse Gen Model: 2272
Pulse Gen Serial Number: 3869163

## 2021-12-14 ENCOUNTER — Encounter: Payer: Self-pay | Admitting: Cardiology

## 2021-12-14 ENCOUNTER — Telehealth: Payer: Self-pay | Admitting: Cardiology

## 2021-12-14 NOTE — Telephone Encounter (Signed)
Todd Peals PA discussed patient's symptoms with Dr. Curt Bears - does not feel cardiac related and recommended follow up with PCP. Pt notified of recommendations.

## 2021-12-14 NOTE — Telephone Encounter (Signed)
°  Pt's daughter is calling to f/u mychart message they sent today. She said pt might have blood clot and they need recommendations what they need to do

## 2021-12-14 NOTE — Telephone Encounter (Signed)
Called to discuss mychart message symptoms with patient. States he continues to be severely lightheaded to the point he has to hold onto walls to ambulate through his home now. He is having orthopnea but denies edema/weight gain weight today on phone was 190 he was 189 at last office visit. Pt is short of breath over the phone with ambulating to the bathroom to weigh.  Pt stated he felt better when his heart rate was 150 compared to now despite pacer transmission showing normal rhythm and rate controlled.  Denies sick contacts, fever or cough. Patient describes mid-thigh area is extremely tender to the touch, "feels like pin sticking me intermittently." Denies pain with walking or bruising to left leg.   Denies taking any OTC medications or change in diet.

## 2021-12-16 ENCOUNTER — Encounter: Payer: Self-pay | Admitting: Cardiology

## 2021-12-17 ENCOUNTER — Encounter: Payer: Self-pay | Admitting: Cardiology

## 2021-12-21 ENCOUNTER — Telehealth: Payer: Self-pay

## 2021-12-21 NOTE — Telephone Encounter (Signed)
Patient daughter in law called in wanting to know if we can see if the patient is going in and out of afib per his pcp. Patient is going to send a transmission and would like a nurse to call him to go over transmission

## 2021-12-21 NOTE — Telephone Encounter (Signed)
Spoke to pt. Pt reports that he has continued light headedness and weakness.  "He can't get up and go". Reports near syncope HRs in 90s,  Aware device clinic will follow up with him on how to send in a manual transmission for [place] to determine if afib may be a factor in symptoms.

## 2021-12-21 NOTE — Telephone Encounter (Signed)
Still awaiting transmission which has not been received 12/21/21 at [place]

## 2021-12-22 NOTE — Progress Notes (Signed)
Remote pacemaker transmission.   

## 2021-12-24 NOTE — Telephone Encounter (Signed)
Manual transmission received/ reviewed.  Report shows normal device function.  No AFIB noted.  Nothing in report to explain patient symptoms.   Spoke with pt.  He stated for about a month now he has felt "swimmy headed" His PCP thought it was due to Afib, advised patient he has not been in Afib in the last month.  He states he felt better before the ablation when his HR was going >100.    Pt reports his BP has been good, today it was 136/66.    Advised I would forward report to Dr. Curt Bears for further review.

## 2021-12-24 NOTE — Telephone Encounter (Signed)
Transmission received. I let the patient know the nurse will review it and give him a call back. 

## 2021-12-29 ENCOUNTER — Ambulatory Visit: Payer: PPO | Admitting: Student

## 2021-12-29 ENCOUNTER — Other Ambulatory Visit: Payer: Self-pay

## 2021-12-29 ENCOUNTER — Encounter: Payer: Self-pay | Admitting: Student

## 2021-12-29 VITALS — BP 130/74 | HR 75 | Ht 75.0 in | Wt 191.0 lb

## 2021-12-29 DIAGNOSIS — I4819 Other persistent atrial fibrillation: Secondary | ICD-10-CM | POA: Diagnosis not present

## 2021-12-29 DIAGNOSIS — I480 Paroxysmal atrial fibrillation: Secondary | ICD-10-CM | POA: Diagnosis not present

## 2021-12-29 LAB — CBC
Hematocrit: 30 % — ABNORMAL LOW (ref 37.5–51.0)
Hemoglobin: 10.4 g/dL — ABNORMAL LOW (ref 13.0–17.7)
MCH: 33.9 pg — ABNORMAL HIGH (ref 26.6–33.0)
MCHC: 34.7 g/dL (ref 31.5–35.7)
MCV: 98 fL — ABNORMAL HIGH (ref 79–97)
Platelets: 307 10*3/uL (ref 150–450)
RBC: 3.07 x10E6/uL — ABNORMAL LOW (ref 4.14–5.80)
RDW: 22 % — ABNORMAL HIGH (ref 11.6–15.4)
WBC: 5.5 10*3/uL (ref 3.4–10.8)

## 2021-12-29 LAB — BASIC METABOLIC PANEL
BUN/Creatinine Ratio: 11 (ref 10–24)
BUN: 17 mg/dL (ref 8–27)
CO2: 25 mmol/L (ref 20–29)
Calcium: 9.5 mg/dL (ref 8.6–10.2)
Chloride: 107 mmol/L — ABNORMAL HIGH (ref 96–106)
Creatinine, Ser: 1.61 mg/dL — ABNORMAL HIGH (ref 0.76–1.27)
Glucose: 82 mg/dL (ref 70–99)
Potassium: 5 mmol/L (ref 3.5–5.2)
Sodium: 142 mmol/L (ref 134–144)
eGFR: 42 mL/min/{1.73_m2} — ABNORMAL LOW (ref >59)

## 2021-12-29 LAB — CUP PACEART INCLINIC DEVICE CHECK
Battery Remaining Longevity: 116 mo
Battery Voltage: 3.01 V
Brady Statistic RA Percent Paced: 38 %
Brady Statistic RV Percent Paced: 0.84 %
Date Time Interrogation Session: 20230118130426
Implantable Lead Implant Date: 20211011
Implantable Lead Implant Date: 20211011
Implantable Lead Location: 753859
Implantable Lead Location: 753860
Implantable Pulse Generator Implant Date: 20211011
Lead Channel Impedance Value: 362.5 Ohm
Lead Channel Impedance Value: 537.5 Ohm
Lead Channel Pacing Threshold Amplitude: 0.75 V
Lead Channel Pacing Threshold Amplitude: 0.75 V
Lead Channel Pacing Threshold Amplitude: 0.75 V
Lead Channel Pacing Threshold Amplitude: 0.75 V
Lead Channel Pacing Threshold Pulse Width: 0.5 ms
Lead Channel Pacing Threshold Pulse Width: 0.5 ms
Lead Channel Pacing Threshold Pulse Width: 0.5 ms
Lead Channel Pacing Threshold Pulse Width: 0.5 ms
Lead Channel Sensing Intrinsic Amplitude: 2.9 mV
Lead Channel Sensing Intrinsic Amplitude: 8.5 mV
Lead Channel Setting Pacing Amplitude: 2 V
Lead Channel Setting Pacing Amplitude: 2 V
Lead Channel Setting Pacing Pulse Width: 0.5 ms
Lead Channel Setting Sensing Sensitivity: 2 mV
Pulse Gen Model: 2272
Pulse Gen Serial Number: 3869163

## 2021-12-29 LAB — TSH: TSH: 1.85 u[IU]/mL (ref 0.450–4.500)

## 2021-12-29 MED ORDER — FUROSEMIDE 20 MG PO TABS: 20.0000 mg | ORAL_TABLET | ORAL | 3 refills | Status: DC

## 2021-12-29 NOTE — Patient Instructions (Signed)
Medication Instructions:  Your physician has recommended you make the following change in your medication:   START: Furosemide 20mg  for 3 days, then take 20mg  every Monday and Friday;  Take an extra if you gain >3lbs overnight or >5lbs in 1 week. You may take OTC Melatonin to help with sleep.  *If you need a refill on your cardiac medications before your next appointment, please call your pharmacy*   Lab Work: TODAY: BMET, CBC, TSH  If you have labs (blood work) drawn today and your tests are completely normal, you will receive your results only by: [facility] (if you have MyChart) OR A paper copy in the mail If you have any lab test that is abnormal or we need to change your treatment, we will call you to review the results.   Follow-Up: At [facility], you and your health needs are our priority.  As part of our continuing mission to provide you with exceptional heart care, we have created designated Provider Care Teams.  These Care Teams include your primary Cardiologist (physician) and Advanced Practice Providers (APPs -  Physician Assistants and Nurse Practitioners) who all work together to provide you with the care you need, when you need it.   Your next appointment:   As scheduled

## 2021-12-29 NOTE — Progress Notes (Signed)
Electrophysiology Office Note Date: 12/29/2021  ID:  Todd Salazar, DOB 1936-09-08, MRN [id number]  PCP: [name], DO Primary Cardiologist: None Electrophysiologist: Will Meredith Leeds, MD   CC: Pacemaker follow-up  Todd Salazar is a 86 y.o. male seen today for Will Meredith Leeds, MD for acute visit due to "feeling poorly s/p AF ablation .    Since discharge from hospital the patient reports doing poorly. He has had less energy, sleeps restlessly, has orthopnea, as well as intermittent leg cramping.  PCP involved in assessing as well. Leg cramping work up negative for DVT. Denies bendopnea, but avoids that position if he can. Uses assistance devices for shoes and socks. Drinking fluid worth 8-10 EIGHT oz bottles of water daily.   Device History: St. Jude Dual Chamber PPM implanted 09/2020 (Outside facility) for symptomatic bradycardia  Past Medical History:  Diagnosis Date   Arthritis    Atrial fibrillation ([place])    Cancer ([place])    prostate   Dysrhythmia    afib   History of kidney stones    Past Surgical History:  Procedure Laterality Date   ATRIAL FIBRILLATION ABLATION N/A 10/29/2021   Procedure: ATRIAL FIBRILLATION ABLATION;  Surgeon: Constance Haw, MD;  Location: [facility];  Service: Cardiovascular;  Laterality: N/A;   Bone spur     CHOLECYSTECTOMY     HERNIA REPAIR     JOINT REPLACEMENT     kidney stones     LAMINECTOMY WITH POSTERIOR LATERAL ARTHRODESIS LEVEL 1 Right 11/25/2019   Procedure: Laminectomy and Foraminotomy  Lumbar Four-Lumbar Five - right, with instrumented fusion;  Surgeon: Eustace Moore, MD;  Location: [facility];  Service: Neurosurgery;  Laterality: Right;  Laminectomy and Foraminotomy  Lumbar Four-Lumbar Five - right, with instrumented fusion   PROSTATE SURGERY      Current Outpatient Medications  Medication Sig Dispense Refill   apixaban (ELIQUIS) 5 MG TABS tablet Take 5 mg by mouth 2 (two) times daily.     Cholecalciferol 25  MCG (1000 UT) capsule Take 1,000 Units by mouth daily.     nitroGLYCERIN (NITROSTAT) 0.4 MG SL tablet Place 0.4 mg under the tongue every 5 (five) minutes as needed for chest pain.      No current facility-administered medications for this visit.    Allergies:   Ciprofloxacin, Tramadol, Duloxetine, Hydrocodone-acetaminophen, Benadryl [diphenhydramine], Nisoldipine, Methocarbamol, Ondansetron hcl, and Sulfa antibiotics   Social History: Social History   Socioeconomic History   Marital status: Widowed    Spouse name: Not on file   Number of children: Not on file   Years of education: Not on file   Highest education level: Not on file  Occupational History   Not on file  Tobacco Use   Smoking status: Former    Types: Cigarettes    Quit date: 12/10/1996    Years since quitting: 25.0   Smokeless tobacco: Never   Tobacco comments:    Former smoker (08/27/2021)  Vaping Use   Vaping Use: Never used  Substance and Sexual Activity   Alcohol use: No   Drug use: No   Sexual activity: Never  Other Topics Concern   Not on file  Social History Narrative   Not on file   Social Determinants of Health   Financial Resource Strain: Not on file  Food Insecurity: Not on file  Transportation Needs: Not on file  Physical Activity: Not on file  Stress: Not on file  Social Connections: Not on file  Intimate Partner Violence: Not on file    Family History: Family History  Problem Relation Age of Onset   Hypertension Father    CAD Neg Hx    Heart failure Neg Hx    Stroke Neg Hx      Review of Systems: All other systems reviewed and are otherwise negative except as noted above.  Physical Exam: Vitals:   12/29/21 1136  BP: 130/74  Pulse: 75  SpO2: 97%  Weight: 191 lb (86.6 kg)  Height: 6\' 3"  (1.905 m)     GEN- The patient is well appearing, alert and oriented x 3 today.   HEENT: normocephalic, atraumatic; sclera clear, conjunctiva pink; hearing intact; oropharynx clear;  neck supple  Lungs- Clear to ausculation bilaterally, normal work of breathing.  No wheezes, rales, rhonchi Heart- Regular rate and rhythm, no murmurs, rubs or gallops  GI- soft, non-tender, non-distended, bowel sounds present  Extremities- no clubbing or cyanosis. No edema MS- no significant deformity or atrophy Skin- warm and dry, no rash or lesion; PPM pocket well healed Psych- euthymic mood, full affect Neuro- strength and sensation are intact  PPM Interrogation- reviewed in detail today,  See PACEART report  EKG:  EKG is not ordered today.  Recent Labs: 08/17/2021: TSH 1.677 08/23/2021: ALT 28 08/27/2021: Magnesium 1.9 10/31/2021: BUN 20; Creatinine, Ser 1.36; Potassium 4.7; Sodium 139 11/26/2021: Hemoglobin 9.2; Platelets 272   Wt Readings from Last 3 Encounters:  12/29/21 191 lb (86.6 kg)  11/26/21 190 lb 9.6 oz (86.5 kg)  10/29/21 187 lb (84.8 kg)     Other studies Reviewed: Additional studies/ records that were reviewed today include: Previous EP office notes, Previous remote checks, Most recent labwork.   Assessment and Plan:  1. Symptomatic bradycardia s/p St. Jude PPM  Normal PPM function See Pace Art report No changes today  2. Persistent atrial ablation S/p ablation 10/29/2021 Post op course complicated by pericarditis treated with colchicine  3. Lightheadedness/ fatigue Multifocal. Deconditioning playing a major part  4. Chronic diastolic CHF Although diastolic dysfunction was not mentioned on echo 08/2021, he has JVD and orthopnea.  Start lasix 20 mg M/F, initially want him to take daily x 3 days.  Labs today.   Symptoms most suggestive of deconditioning + diastolic CHF/volume overload. Encouragement given that AF ablation was very successful in minimizing burden of AF.   Current medicines are reviewed at length with the patient today.    Labs/ tests ordered today include:  Orders Placed This Encounter  Procedures   Basic metabolic panel   CBC    TSH    Disposition:   Follow up with EP APP in 2 weeks to reassess volume status.     Jacalyn Lefevre, PA-C  12/29/2021 12:14 PM  [place] [address] [phone] (office) [phone] (fax)

## 2022-01-12 ENCOUNTER — Other Ambulatory Visit: Payer: Self-pay

## 2022-01-12 ENCOUNTER — Ambulatory Visit (INDEPENDENT_AMBULATORY_CARE_PROVIDER_SITE_OTHER): Payer: PPO | Admitting: Student

## 2022-01-12 ENCOUNTER — Encounter: Payer: Self-pay | Admitting: Student

## 2022-01-12 VITALS — BP 112/64 | HR 60 | Ht 75.0 in | Wt 189.8 lb

## 2022-01-12 DIAGNOSIS — I5032 Chronic diastolic (congestive) heart failure: Secondary | ICD-10-CM | POA: Diagnosis not present

## 2022-01-12 DIAGNOSIS — I480 Paroxysmal atrial fibrillation: Secondary | ICD-10-CM | POA: Diagnosis not present

## 2022-01-12 DIAGNOSIS — I4819 Other persistent atrial fibrillation: Secondary | ICD-10-CM

## 2022-01-12 NOTE — Progress Notes (Signed)
Electrophysiology Office Note Date: 01/12/2022  ID:  Todd Salazar, DOB 04/25/1936, MRN [id number]  PCP: [name], DO Primary Cardiologist: None Electrophysiologist: Will Meredith Leeds, MD   CC: Pacemaker follow-up  Todd Salazar is a 86 y.o. male seen today for Will Meredith Leeds, MD for routine electrophysiology followup.  Lasix added at last visit, hasn't noted much difference. Has come back somewhat on fluid intake. Remains SOB when lying flat at night or when walking more than short distances. No chest pain. No bendopnea. No syncope. Did feel brief palpitations yesterday but HR 100s  Device History: St. Jude Dual Chamber PPM implanted 09/2020 (Outside facility) for symptomatic bradycardia  Past Medical History:  Diagnosis Date   Arthritis    Atrial fibrillation ([place])    Cancer ([place])    prostate   Dysrhythmia    afib   History of kidney stones    Past Surgical History:  Procedure Laterality Date   ATRIAL FIBRILLATION ABLATION N/A 10/29/2021   Procedure: ATRIAL FIBRILLATION ABLATION;  Surgeon: Constance Haw, MD;  Location: [facility];  Service: Cardiovascular;  Laterality: N/A;   Bone spur     CHOLECYSTECTOMY     HERNIA REPAIR     JOINT REPLACEMENT     kidney stones     LAMINECTOMY WITH POSTERIOR LATERAL ARTHRODESIS LEVEL 1 Right 11/25/2019   Procedure: Laminectomy and Foraminotomy  Lumbar Four-Lumbar Five - right, with instrumented fusion;  Surgeon: Eustace Moore, MD;  Location: [facility];  Service: Neurosurgery;  Laterality: Right;  Laminectomy and Foraminotomy  Lumbar Four-Lumbar Five - right, with instrumented fusion   PROSTATE SURGERY      Current Outpatient Medications  Medication Sig Dispense Refill   apixaban (ELIQUIS) 5 MG TABS tablet Take 5 mg by mouth 2 (two) times daily.     Cholecalciferol 25 MCG (1000 UT) capsule Take 1,000 Units by mouth daily.     furosemide (LASIX) 20 MG tablet Take 1 tablet (20 mg total) by mouth as directed. Take  20mg  for 3 days then take every Monday & Friday; Take additional as needed for weight gain. 30 tablet 3   nitroGLYCERIN (NITROSTAT) 0.4 MG SL tablet Place 0.4 mg under the tongue every 5 (five) minutes as needed for chest pain.      No current facility-administered medications for this visit.    Allergies:   Ciprofloxacin, Tramadol, Duloxetine, Hydrocodone-acetaminophen, Benadryl [diphenhydramine], Nisoldipine, Methocarbamol, Ondansetron hcl, and Sulfa antibiotics   Social History: Social History   Socioeconomic History   Marital status: Widowed    Spouse name: Not on file   Number of children: Not on file   Years of education: Not on file   Highest education level: Not on file  Occupational History   Not on file  Tobacco Use   Smoking status: Former    Types: Cigarettes    Quit date: 12/10/1996    Years since quitting: 25.1   Smokeless tobacco: Never   Tobacco comments:    Former smoker (08/27/2021)  Vaping Use   Vaping Use: Never used  Substance and Sexual Activity   Alcohol use: No   Drug use: No   Sexual activity: Never  Other Topics Concern   Not on file  Social History Narrative   Not on file   Social Determinants of Health   Financial Resource Strain: Not on file  Food Insecurity: Not on file  Transportation Needs: Not on file  Physical Activity: Not on file  Stress:  Not on file  Social Connections: Not on file  Intimate Partner Violence: Not on file    Family History: Family History  Problem Relation Age of Onset   Hypertension Father    CAD Neg Hx    Heart failure Neg Hx    Stroke Neg Hx      Review of Systems: All other systems reviewed and are otherwise negative except as noted above.  Physical Exam: Vitals:   01/12/22 1119  BP: 112/64  Pulse: 60  SpO2: 99%  Weight: 189 lb 12.8 oz (86.1 kg)  Height: 6\' 3"  (1.905 m)    General:  Elderly appearing. No resp difficulty. HEENT: Normal Neck: Supple. JVP 6-8. Carotids 2+ bilat; no bruits.  No thyromegaly or nodule noted. Cor: PMI nondisplaced. RRR, No M/G/R noted Lungs: CTAB, normal effort. Abdomen: Soft, non-tender, non-distended, no HSM. No bruits or masses. +BS   Extremities: No cyanosis, clubbing, or rash. R and LLE no edema.  Neuro: Alert & orientedx3, cranial nerves grossly intact. moves all 4 extremities w/o difficulty. Affect pleasant   PPM Interrogation- See previous PACEART report. Not checked today,.   EKG:  EKG is not ordered today.  Recent Labs: 08/23/2021: ALT 28 08/27/2021: Magnesium 1.9 12/29/2021: BUN 17; Creatinine, Ser 1.61; Hemoglobin 10.4; Platelets 307; Potassium 5.0; Sodium 142; TSH 1.850   Wt Readings from Last 3 Encounters:  01/12/22 189 lb 12.8 oz (86.1 kg)  12/29/21 191 lb (86.6 kg)  11/26/21 190 lb 9.6 oz (86.5 kg)     Other studies Reviewed: Additional studies/ records that were reviewed today include: Previous EP office notes, Previous remote checks, Most recent labwork.   Assessment and Plan:  1. Symptomatic bradycardia s/p St. Jude PPM  Normal PPM function 2 weeks prior. Not checked today.   2. Persistent atrial ablation S/p ablation 10/29/2021 Post op course complicated by pericarditis treated with colchicine  3. Lightheadedness/ fatigue Multifocal. Deconditioning playing a major part still.  4. Chronic diastolic CHF Although diastolic dysfunction was not mentioned on echo 08/2021, he has JVD and orthopnea.  Continue lasix twice weekly, additional as needed. Labs today.   No real relief with lasix, and not obviously volume overloaded. Suspect more deconditioning as primary cause. Continue lasix for now. Encouraged increased activity as tolerated.   Current medicines are reviewed at length with the patient today.    Labs/ tests ordered today include:  No orders of the defined types were placed in this encounter.   Disposition:   Follow up with Dr. Curt Bears as scheduled.    Jacalyn Lefevre, PA-C   01/12/2022 11:34 AM  [facility] [address] [place] [address] [phone] (office) [phone] (fax)

## 2022-01-12 NOTE — Patient Instructions (Signed)
Medication Instructions:  Your physician recommends that you continue on your current medications as directed. Please refer to the Current Medication list given to you today.  *If you need a refill on your cardiac medications before your next appointment, please call your pharmacy*   Lab Work: TODAY: BMET, ProBNP  If you have labs (blood work) drawn today and your tests are completely normal, you will receive your results only by: [place] (if you have MyChart) OR A paper copy in the mail If you have any lab test that is abnormal or we need to change your treatment, we will call you to review the results.   Follow-Up: At [facility], you and your health needs are our priority.  As part of our continuing mission to provide you with exceptional heart care, we have created designated Provider Care Teams.  These Care Teams include your primary Cardiologist (physician) and Advanced Practice Providers (APPs -  Physician Assistants and Nurse Practitioners) who all work together to provide you with the care you need, when you need it.   Your next appointment:   As scheduled

## 2022-01-13 LAB — BASIC METABOLIC PANEL
BUN/Creatinine Ratio: 16 (ref 10–24)
BUN: 21 mg/dL (ref 8–27)
CO2: 25 mmol/L (ref 20–29)
Calcium: 9.4 mg/dL (ref 8.6–10.2)
Chloride: 108 mmol/L — ABNORMAL HIGH (ref 96–106)
Creatinine, Ser: 1.34 mg/dL — ABNORMAL HIGH (ref 0.76–1.27)
Glucose: 83 mg/dL (ref 70–99)
Potassium: 4.8 mmol/L (ref 3.5–5.2)
Sodium: 143 mmol/L (ref 134–144)
eGFR: 53 mL/min/{1.73_m2} — ABNORMAL LOW (ref >59)

## 2022-01-13 LAB — PRO B NATRIURETIC PEPTIDE: NT-Pro BNP: 261 pg/mL (ref 0–486)

## 2022-02-07 ENCOUNTER — Ambulatory Visit: Payer: PPO | Admitting: Cardiology

## 2022-03-10 ENCOUNTER — Ambulatory Visit (INDEPENDENT_AMBULATORY_CARE_PROVIDER_SITE_OTHER): Payer: PPO

## 2022-03-10 DIAGNOSIS — I4819 Other persistent atrial fibrillation: Secondary | ICD-10-CM

## 2022-03-10 LAB — CUP PACEART REMOTE DEVICE CHECK
Battery Remaining Longevity: 103 mo
Battery Remaining Percentage: 88 %
Battery Voltage: 3.01 V
Brady Statistic AP VP Percent: 1 %
Brady Statistic AP VS Percent: 31 %
Brady Statistic AS VP Percent: 1 %
Brady Statistic AS VS Percent: 68 %
Brady Statistic RA Percent Paced: 30 %
Brady Statistic RV Percent Paced: 1.1 %
Date Time Interrogation Session: 20230330040015
Implantable Lead Implant Date: 20211011
Implantable Lead Implant Date: 20211011
Implantable Lead Location: 753859
Implantable Lead Location: 753860
Implantable Pulse Generator Implant Date: 20211011
Lead Channel Impedance Value: 350 Ohm
Lead Channel Impedance Value: 450 Ohm
Lead Channel Pacing Threshold Amplitude: 0.75 V
Lead Channel Pacing Threshold Amplitude: 0.75 V
Lead Channel Pacing Threshold Pulse Width: 0.5 ms
Lead Channel Pacing Threshold Pulse Width: 0.5 ms
Lead Channel Sensing Intrinsic Amplitude: 1.6 mV
Lead Channel Sensing Intrinsic Amplitude: 7.7 mV
Lead Channel Setting Pacing Amplitude: 2 V
Lead Channel Setting Pacing Amplitude: 2 V
Lead Channel Setting Pacing Pulse Width: 0.5 ms
Lead Channel Setting Sensing Sensitivity: 2 mV
Pulse Gen Model: 2272
Pulse Gen Serial Number: 3869163

## 2022-03-18 ENCOUNTER — Telehealth: Payer: Self-pay | Admitting: Cardiology

## 2022-03-18 DIAGNOSIS — I480 Paroxysmal atrial fibrillation: Secondary | ICD-10-CM

## 2022-03-18 MED ORDER — APIXABAN 5 MG PO TABS: 5.0000 mg | ORAL_TABLET | Freq: Two times a day (BID) | ORAL | 1 refills | Status: DC

## 2022-03-18 NOTE — Telephone Encounter (Signed)
? ?*  STAT* If patient is at the pharmacy, call can be transferred to refill team. ? ? ?1. Which medications need to be refilled? (please list name of each medication and dose if known) apixaban (ELIQUIS) 5 MG TABS tablet ? ?2. Which pharmacy/location (including street and city if local pharmacy) is medication to be sent to? [place], [place] - [address] ? ?3. Do they need a 30 day or 90 day supply? 90 days ? ?Pt is almost out of meds needs refill today ?

## 2022-03-18 NOTE — Telephone Encounter (Signed)
Prescription refill request for Eliquis received. ?Indication: Afib  ?Last office visit: 01/12/22 Chalmers Cater)  ?Scr: 1.34 (01/12/22) ?Age: 86 ?Weight: 86.1kg ? ?Appropriate dose and refill sent to requested pharmacy.  ?

## 2022-03-22 NOTE — Progress Notes (Signed)
Remote pacemaker transmission.   

## 2022-04-04 ENCOUNTER — Emergency Department (HOSPITAL_BASED_OUTPATIENT_CLINIC_OR_DEPARTMENT_OTHER): Payer: PPO

## 2022-04-04 ENCOUNTER — Emergency Department (HOSPITAL_BASED_OUTPATIENT_CLINIC_OR_DEPARTMENT_OTHER)
Admission: EM | Admit: 2022-04-04 | Discharge: 2022-04-04 | Disposition: A | Discharge status: Home / Self Care | Payer: PPO | Attending: Emergency Medicine | Admitting: Emergency Medicine

## 2022-04-04 ENCOUNTER — Ambulatory Visit (INDEPENDENT_AMBULATORY_CARE_PROVIDER_SITE_OTHER): Payer: PPO | Admitting: Cardiology

## 2022-04-04 ENCOUNTER — Encounter (HOSPITAL_BASED_OUTPATIENT_CLINIC_OR_DEPARTMENT_OTHER): Payer: Self-pay

## 2022-04-04 ENCOUNTER — Other Ambulatory Visit: Payer: Self-pay

## 2022-04-04 ENCOUNTER — Encounter: Payer: Self-pay | Admitting: Cardiology

## 2022-04-04 VITALS — BP 128/62 | HR 79 | Ht 75.0 in

## 2022-04-04 DIAGNOSIS — Z7901 Long term (current) use of anticoagulants: Secondary | ICD-10-CM | POA: Insufficient documentation

## 2022-04-04 DIAGNOSIS — D6869 Other thrombophilia: Secondary | ICD-10-CM | POA: Diagnosis not present

## 2022-04-04 DIAGNOSIS — M545 Low back pain, unspecified: Secondary | ICD-10-CM | POA: Insufficient documentation

## 2022-04-04 DIAGNOSIS — M25551 Pain in right hip: Secondary | ICD-10-CM | POA: Diagnosis present

## 2022-04-04 DIAGNOSIS — I4819 Other persistent atrial fibrillation: Secondary | ICD-10-CM

## 2022-04-04 LAB — CBC WITH DIFFERENTIAL/PLATELET
Abs Immature Granulocytes: 0 10*3/uL (ref 0.00–0.07)
Basophils Absolute: 0 10*3/uL (ref 0.0–0.1)
Basophils Relative: 1 %
Eosinophils Absolute: 0.1 10*3/uL (ref 0.0–0.5)
Eosinophils Relative: 3 %
HCT: 30 % — ABNORMAL LOW (ref 39.0–52.0)
Hemoglobin: 10.1 g/dL — ABNORMAL LOW (ref 13.0–17.0)
Immature Granulocytes: 0 %
Lymphocytes Relative: 27 %
Lymphs Abs: 1.2 10*3/uL (ref 0.7–4.0)
MCH: 34.7 pg — ABNORMAL HIGH (ref 26.0–34.0)
MCHC: 33.7 g/dL (ref 30.0–36.0)
MCV: 103.1 fL — ABNORMAL HIGH (ref 80.0–100.0)
Monocytes Absolute: 0.7 10*3/uL (ref 0.1–1.0)
Monocytes Relative: 16 %
Neutro Abs: 2.3 10*3/uL (ref 1.7–7.7)
Neutrophils Relative %: 53 %
Platelets: 271 10*3/uL (ref 150–400)
RBC: 2.91 MIL/uL — ABNORMAL LOW (ref 4.22–5.81)
RDW: 23.4 % — ABNORMAL HIGH (ref 11.5–15.5)
WBC: 4.3 10*3/uL (ref 4.0–10.5)
nRBC: 0.5 % — ABNORMAL HIGH (ref 0.0–0.2)

## 2022-04-04 LAB — COMPREHENSIVE METABOLIC PANEL
ALT: 16 U/L (ref 0–44)
AST: 19 U/L (ref 15–41)
Albumin: 4.3 g/dL (ref 3.5–5.0)
Alkaline Phosphatase: 76 U/L (ref 38–126)
Anion gap: 6 (ref 5–15)
BUN: 19 mg/dL (ref 8–23)
CO2: 25 mmol/L (ref 22–32)
Calcium: 8.9 mg/dL (ref 8.9–10.3)
Chloride: 112 mmol/L — ABNORMAL HIGH (ref 98–111)
Creatinine, Ser: 1.26 mg/dL — ABNORMAL HIGH (ref 0.61–1.24)
GFR, Estimated: 57 mL/min — ABNORMAL LOW (ref >60)
Glucose, Bld: 73 mg/dL (ref 70–99)
Potassium: 4.8 mmol/L (ref 3.5–5.1)
Sodium: 143 mmol/L (ref 135–145)
Total Bilirubin: 1.4 mg/dL — ABNORMAL HIGH (ref 0.3–1.2)
Total Protein: 7.1 g/dL (ref 6.5–8.1)

## 2022-04-04 MED ORDER — OXYCODONE HCL 5 MG PO TABS
5.0000 mg | ORAL_TABLET | Freq: Once | ORAL | Status: AC
  Administered 2022-04-04: 5 mg via ORAL
  Filled 2022-04-04: qty 1

## 2022-04-04 MED ORDER — KETOROLAC TROMETHAMINE 15 MG/ML IJ SOLN
15.0000 mg | Freq: Once | INTRAMUSCULAR | Status: AC
Start: 2022-04-04 — End: 2022-04-04
  Administered 2022-04-04: 15 mg via INTRAMUSCULAR
  Filled 2022-04-04: qty 1

## 2022-04-04 MED ORDER — DICLOFENAC SODIUM 1 % EX GEL: 4.0000 g | Freq: Four times a day (QID) | CUTANEOUS | 0 refills | Status: DC

## 2022-04-04 MED ORDER — METHYLPREDNISOLONE 4 MG PO TBPK: ORAL_TABLET | ORAL | 0 refills | Status: DC

## 2022-04-04 MED ORDER — DIAZEPAM 2 MG PO TABS
2.0000 mg | ORAL_TABLET | Freq: Once | ORAL | Status: AC
  Administered 2022-04-04: 2 mg via ORAL
  Filled 2022-04-04: qty 1

## 2022-04-04 MED ORDER — MORPHINE SULFATE 15 MG PO TABS: 7.5000 mg | ORAL_TABLET | ORAL | 0 refills | Status: DC | PRN

## 2022-04-04 MED ORDER — ACETAMINOPHEN 500 MG PO TABS
1000.0000 mg | ORAL_TABLET | Freq: Once | ORAL | Status: AC
  Administered 2022-04-04: 1000 mg via ORAL
  Filled 2022-04-04: qty 2

## 2022-04-04 NOTE — ED Provider Notes (Signed)
?[facility] EMERGENCY DEPARTMENT ?Provider Note ? ? ?CSN: [id number] ?Arrival date & time: 04/04/22  1424 ? ?  ? ?History ? ?Chief Complaint  ?Patient presents with  ? Hip Pain  ? ? ?Todd Salazar is a 86 y.o. male. ? ?86 yo M with a chief complaints of right low back pain.  This been going on for couple days.  No injury no significant recent exercise.  He denies loss of bowel or bladder denies loss of peritoneal sensation denies numbness or weakness to the leg.  Denies history of cancer.  Scheduled appointment essentially getting up and moving around at all.  He had a couple of events where he almost fell down.  Denies any abdominal pain. ? ? ?Hip Pain ? ? ?  ? ?Home Medications ?Prior to Admission medications   ?Medication Sig Start Date End Date Taking? Authorizing Provider  ?diclofenac Sodium (VOLTAREN) 1 % GEL Apply 4 g topically 4 (four) times daily. 04/04/22  Yes [name], DO  ?methylPREDNISolone (MEDROL DOSEPAK) 4 MG TBPK tablet Day 1: '8mg'$  before breakfast, 4 mg after lunch, 4 mg after supper, and 8 mg at bedtime Day 2: 4 mg before breakfast, 4 mg after lunch, 4 mg  after supper, and 8 mg  at bedtime Day 3:  4 mg  before breakfast, 4 mg  after lunch, 4 mg after supper, and 4 mg  at bedtime Day 4: 4 mg  before breakfast, 4 mg  after lunch, and 4 mg at bedtime Day 5: 4 mg  before breakfast and 4 mg at bedtime Day 6: 4 mg  before breakfast 04/04/22  Yes [name], DO  ?apixaban (ELIQUIS) 5 MG TABS tablet Take 1 tablet (5 mg total) by mouth 2 (two) times daily. 03/18/22   Camnitz, Ocie Doyne, MD  ?gabapentin (NEURONTIN) 100 MG capsule Take 200 mg by mouth at bedtime as needed. 01/24/22   [provider]  ?morphine (MSIR) 15 MG tablet Take 0.5 tablets (7.5 mg total) by mouth every 4 (four) hours as needed for severe pain. 04/04/22   [name], DO  ?nitroGLYCERIN (NITROSTAT) 0.4 MG SL tablet Place 0.4 mg under the tongue every 5 (five) minutes as needed for chest pain.  07/25/19   [provider]  ?   ? ?Allergies    ?Ciprofloxacin, Tramadol, Duloxetine, Hydrocodone-acetaminophen, Benadryl [diphenhydramine], Nisoldipine, Methocarbamol, Ondansetron hcl, and Sulfa antibiotics   ? ?Review of Systems   ?Review of Systems ? ?Physical Exam ?Updated Vital Signs ?BP (!) 125/53 (BP Location: Left Arm)   Pulse 70   Temp 98.4 ?F (36.9 ?C)   Resp 18   Wt 86.2 kg   SpO2 94%   BMI 23.75 kg/m?  ?Physical Exam ?Vitals and nursing note reviewed.  ?Constitutional:   ?   Appearance: He is well-developed.  ?HENT:  ?   Head: Normocephalic and atraumatic.  ?Eyes:  ?   Pupils: Pupils are equal, round, and reactive to light.  ?Neck:  ?   Vascular: No JVD.  ?Cardiovascular:  ?   Rate and Rhythm: Normal rate and regular rhythm.  ?   Heart sounds: No murmur heard. ?  No friction rub. No gallop.  ?Pulmonary:  ?   Effort: No respiratory distress.  ?   Breath sounds: No wheezing.  ?Abdominal:  ?   General: There is no distension.  ?   Tenderness: There is no abdominal tenderness. There is no guarding or rebound.  ?Musculoskeletal:     ?  General: Tenderness present. Normal range of motion.  ?   Cervical back: Normal range of motion and neck supple.  ?   Comments: Pain is worse about the right piriformis muscle belly.  Pulse motor and sensation intact distally.  No clonus.  Reflexes are 2+ and equal.  Positive straight leg raise test.  ?Skin: ?   Coloration: Skin is not pale.  ?   Findings: No rash.  ?Neurological:  ?   Mental Status: He is alert and oriented to person, place, and time.  ?Psychiatric:     ?   Behavior: Behavior normal.  ? ? ?ED Results / Procedures / Treatments   ?Labs ?(all labs ordered are listed, but only abnormal results are displayed) ?Labs Reviewed  ?CBC WITH DIFFERENTIAL/PLATELET - Abnormal; Notable for the following components:  ?    Result Value  ? RBC 2.91 (*)   ? Hemoglobin 10.1 (*)   ? HCT 30.0 (*)   ? MCV 103.1 (*)   ? MCH 34.7 (*)   ? RDW 23.4 (*)   ? nRBC 0.5 (*)   ? All other  components within normal limits  ?COMPREHENSIVE METABOLIC PANEL - Abnormal; Notable for the following components:  ? Chloride 112 (*)   ? Creatinine, Ser 1.26 (*)   ? Total Bilirubin 1.4 (*)   ? GFR, Estimated 57 (*)   ? All other components within normal limits  ? ? ?EKG ?None ? ?Radiology ?CT L-SPINE NO CHARGE ? ?Result Date: 04/04/2022 ?CLINICAL DATA:  Severe right hip pain EXAM: CT LUMBAR SPINE WITHOUT CONTRAST TECHNIQUE: Multidetector CT imaging of the lumbar spine was performed without intravenous contrast administration. Multiplanar CT image reconstructions were also generated. RADIATION DOSE REDUCTION: This exam was performed according to the departmental dose-optimization program which includes automated exposure control, adjustment of the mA and/or kV according to patient size and/or use of iterative reconstruction technique. COMPARISON:  June 16, 2020 FINDINGS: Segmentation: 5 lumbar type vertebrae. Alignment: Similar to prior. Vertebrae: Decreased osseous mineralization. Vertebral body heights are similar. Postoperative changes are again identified at L5-S1 with rods and pedicle screws and right laminectomy. Paraspinal and other soft tissues: Partially imaged bilateral hip arthroplasties. Nonobstructing left renal calculi. Atherosclerosis. Sigmoid diverticulosis. Disc levels: L1-L2:  No significant stenosis. L2-L3: Bridging bone across the disc space. Endplate osteophytes. Facet hypertrophy. No significant canal stenosis. Minor foraminal stenosis. L3-L4: Mild retrolisthesis. Disc bulge with endplate osteophytic ridging. Facet hypertrophy with ligamentum flavum thickening. Mild canal stenosis. Moderate right and mild to moderate left foraminal stenosis. L4-L5: Operative level. Endplate osteophytes. Facet hypertrophy with residual ligamentum flavum thickening. No canal stenosis. Moderate foraminal stenosis. L5-S1: Disc bulge with endplate osteophytic ridging. Probable superimposed left central/subarticular  disc extrusion extending above disc level with vacuum phenomenon. Facet arthropathy with ligamentum flavum thickening. No canal stenosis. Mild to moderate foraminal stenosis. IMPRESSION: No acute osseous abnormality. Degenerative and postoperative changes as detailed above. No substantial progression since 2021 examination. Electronically Signed   By: Macy Mis M.D.   On: 04/04/2022 16:22  ? ?CT Renal Stone Study ? ?Result Date: 04/04/2022 ?CLINICAL DATA:  Right flank pain without known injury. EXAM: CT ABDOMEN AND PELVIS WITHOUT CONTRAST TECHNIQUE: Multidetector CT imaging of the abdomen and pelvis was performed following the standard protocol without IV contrast. RADIATION DOSE REDUCTION: This exam was performed according to the departmental dose-optimization program which includes automated exposure control, adjustment of the mA and/or kV according to patient size and/or use of iterative reconstruction technique. COMPARISON:  October 31, 2021. FINDINGS: Lower chest: No acute abnormality. Hepatobiliary: No focal liver abnormality is seen. Status post cholecystectomy. No biliary dilatation. Pancreas: Unremarkable. No pancreatic ductal dilatation or surrounding inflammatory changes. Spleen: Normal in size without focal abnormality. Adrenals/Urinary Tract: Adrenal glands appear normal. Left nephrolithiasis is noted. No hydronephrosis or renal obstruction is noted. Urinary bladder is unremarkable. Stomach/Bowel: Stomach is within normal limits. Appendix appears normal. No evidence of bowel wall thickening, distention, or inflammatory changes. Diverticulosis is noted throughout the colon without inflammation. Vascular/Lymphatic: Aortic atherosclerosis. No enlarged abdominal or pelvic lymph nodes. Reproductive: Prostate is not well visualized due to scatter artifact arising from bilateral hip arthroplasties. Penile implant reservoir is noted anteriorly in the pelvis. Other: No abdominal wall hernia or abnormality.  No abdominopelvic ascites. Musculoskeletal: Status post bilateral total hip arthroplasties. No acute osseous abnormality is noted. IMPRESSION: Nonobstructive left nephrolithiasis. No hydronephrosis or renal obstruction is n

## 2022-04-04 NOTE — Discharge Instructions (Signed)
Your back pain is most likely due to a muscular strain.  There is been a lot of research on back pain, unfortunately the only thing that seems to really help is Tylenol and ibuprofen.  Relative rest is also important to not lift greater than 10 pounds bending or twisting at the waist.  Please follow-up with your family physician.  The other thing that really seems to benefit patients is physical therapy which your doctor may send you for.  Please return to the emergency department for new numbness or weakness to your arms or legs. Difficulty with urinating or urinating or pooping on yourself.  Also if you cannot feel toilet paper when you wipe or get a fever.  ? ?Take the steroids as prescribed.  Use the gel as prescribed ?Also take tylenol '1000mg'$ (2 extra strength) four times a day.  ? ?Be careful with the pain medicine.  This can make it easier for you to fall or will constipate you.  Cannot read a car when you take this medicine. ? ?

## 2022-04-04 NOTE — ED Triage Notes (Signed)
Pt reports severe right hip pain since Saturday. Denies injury. Hip replacement over a year ago ?

## 2022-04-04 NOTE — Progress Notes (Signed)
? ?Electrophysiology Office Note ? ? ?Date:  04/04/2022  ? ?ID:  Todd Salazar, DOB 1935-12-22, MRN [id number] ? ?PCP:  Todd Oman, DO  ?Cardiologist:   ?Primary Electrophysiologist:  Adora Yeh Meredith Leeds, MD   ? ?Chief Complaint: AF ?  ?History of Present Illness: ?Todd Salazar is a 86 y.o. male who is being seen today for the evaluation of AF at the request of Todd Oman, DO. Presenting today for electrophysiology evaluation. ? ?He has a history significant for atrial fibrillation, atrial flutter, bronchiectasis with restrictive lung disease, symptomatic bradycardia status post [employer] Jude dual-chamber pacemaker, orthostatic hypotension, prostate cancer, CKD stage III.  He presented to the hospital September 2022 in atrial fibrillation and has been loaded on dofetilide.  He is now status post atrial fibrillation ablation on 10/29/2021. ? ?Post ablation he presented emergency room with pericarditis.  He was started on colchicine. ? ?For his ablation, he presented to cardiology clinic complaining of shortness of breath.  He was found to have diastolic heart failure and was diuresed. ? ?Today, denies symptoms of palpitations, chest pain, shortness of breath, orthopnea, PND, lower extremity edema, claudication, dizziness, presyncope, syncope, bleeding, or neurologic sequela. The patient is tolerating medications without difficulties.  Since being seen he has done well.  He is had a low burden, less than 1%, of atrial fibrillation since his ablation.  He is complaining of hip pain today.  He has been having this for the last few days.  He is going to the emergency room after this visit. ? ? ?Past Medical History:  ?Diagnosis Date  ? Arthritis   ? Atrial fibrillation ([place])   ? Cancer [facility])   ? prostate  ? Dysrhythmia   ? afib  ? History of kidney stones   ? ?Past Surgical History:  ?Procedure Laterality Date  ? ATRIAL FIBRILLATION ABLATION N/A 10/29/2021  ? Procedure: ATRIAL FIBRILLATION ABLATION;  Surgeon: Constance Haw, MD;  Location: [facility];  Service: Cardiovascular;  Laterality: N/A;  ? Bone spur    ? CHOLECYSTECTOMY    ? HERNIA REPAIR    ? JOINT REPLACEMENT    ? kidney stones    ? LAMINECTOMY WITH POSTERIOR LATERAL ARTHRODESIS LEVEL 1 Right 11/25/2019  ? Procedure: Laminectomy and Foraminotomy  Lumbar Four-Lumbar Five - right, with instrumented fusion;  Surgeon: Eustace Moore, MD;  Location: [facility];  Service: Neurosurgery;  Laterality: Right;  Laminectomy and Foraminotomy  Lumbar Four-Lumbar Five - right, with instrumented fusion  ? PROSTATE SURGERY    ? ? ? ?Current Outpatient Medications  ?Medication Sig Dispense Refill  ? apixaban (ELIQUIS) 5 MG TABS tablet Take 1 tablet (5 mg total) by mouth 2 (two) times daily. 180 tablet 1  ? nitroGLYCERIN (NITROSTAT) 0.4 MG SL tablet Place 0.4 mg under the tongue every 5 (five) minutes as needed for chest pain.     ? gabapentin (NEURONTIN) 100 MG capsule Take 200 mg by mouth at bedtime as needed.    ? ?No current facility-administered medications for this visit.  ? ? ?Allergies:   Ciprofloxacin, Tramadol, Duloxetine, Hydrocodone-acetaminophen, Benadryl [diphenhydramine], Nisoldipine, Methocarbamol, Ondansetron hcl, and Sulfa antibiotics  ? ?Social History:  The patient  reports that he quit smoking about 25 years ago. His smoking use included cigarettes. He has never used smokeless tobacco. He reports that he does not drink alcohol and does not use drugs.  ? ?Family History:  The patient's family history includes Hypertension in his father.  ? ?ROS:  Please see the history of present illness.   Otherwise, review of systems is positive for none.   All other systems are reviewed and negative.  ? ?PHYSICAL EXAM: ?VS:  BP 128/62   Pulse 79   Ht '6\' 3"'$  (1.905 m)   SpO2 97%   BMI 23.72 kg/m?  , BMI Body mass index is 23.72 kg/m?. ?GEN: Well nourished, well developed, in no acute distress  ?HEENT: normal  ?Neck: no JVD, carotid bruits, or masses ?Cardiac: RRR; no  murmurs, rubs, or gallops,no edema  ?Respiratory:  clear to auscultation bilaterally, normal work of breathing ?GI: soft, nontender, nondistended, + BS ?MS: no deformity or atrophy  ?Skin: warm and dry, device site well healed ?Neuro:  Strength and sensation are intact ?Psych: euthymic mood, full affect ? ?EKG:  EKG is ordered today. ?Personal review of the ekg ordered shows this rhythm, rate 79 ? ?Personal review of the device interrogation today. Results in [place]  ? ? ?Recent Labs: ?08/23/2021: ALT 28 ?08/27/2021: Magnesium 1.9 ?12/29/2021: Hemoglobin 10.4; Platelets 307; TSH 1.850 ?01/12/2022: BUN 21; Creatinine, Ser 1.34; NT-Pro BNP 261; Potassium 4.8; Sodium 143  ? ? ?Lipid Panel  ?   ?Component Value Date/Time  ? CHOL 174 12/11/2011 0500  ? TRIG 123 12/11/2011 0500  ? HDL 29 (L) 12/11/2011 0500  ? CHOLHDL 6.0 12/11/2011 0500  ? VLDL 25 12/11/2011 0500  ? [place] 120 (H) 12/11/2011 0500  ? ? ? ?Wt Readings from Last 3 Encounters:  ?01/12/22 189 lb 12.8 oz (86.1 kg)  ?12/29/21 191 lb (86.6 kg)  ?11/26/21 190 lb 9.6 oz (86.5 kg)  ?  ? ? ?Other studies Reviewed: ?Additional studies/ records that were reviewed today include: TTE 08/19/21  ?Review of the above records today demonstrates:  ? 1. Left ventricular ejection fraction, by estimation, is 55 to 60%. The  ?left ventricle has normal function. The left ventricle has no regional  ?wall motion abnormalities. There is mild left ventricular hypertrophy.  ?Left ventricular diastolic parameters  ?were normal.  ? 2. Right ventricular systolic function is normal. The right ventricular  ?size is mildly enlarged. There is normal pulmonary artery systolic  ?pressure. The estimated right ventricular systolic pressure is 75.9 mmHg.  ? 3. Right atrial size was mildly dilated.  ? 4. The mitral valve is normal in structure. Trivial mitral valve  ?regurgitation.  ? 5. Tricuspid valve regurgitation is mild to moderate.  ? 6. The aortic valve is tricuspid. Aortic valve regurgitation  is not  ?visualized. No aortic stenosis is present.  ? 7. The inferior vena cava is normal in size with greater than 50%  ?respiratory variability, suggesting right atrial pressure of 3 mmHg.  ? ? ?ASSESSMENT AND PLAN: ? ?1.  Persistent atrial fibrillation/atrial flutter: CHA2DS2-VASc of 2.  Currently on Eliquis 5 mg twice daily.  He has a less than 1% atrial fibrillation burden on his monitor.  He is overall happy with his control.  No changes at this time. ? ?2.  Restrictive lung disease: Patient without shortness of breath.  Attempting to avoid amiodarone. ? ?3.  Symptomatic bradycardia: Status post [facility] Jude dual-chamber pacemaker.  Device functioning appropriately.  No changes at this time. ? ?4.  Secondary hypercoagulable state: Currently on Eliquis for atrial fibrillation as above. ? ?Current medicines are reviewed at length with the patient today.   ?The patient does not have concerns regarding his medicines.  The following changes were made today: None ? ?Labs/ tests ordered today include:  ?  Orders Placed This Encounter  ?Procedures  ? EKG 12-Lead  ? ? ? ? ?Disposition:   FU 6 months ? ?Signed, ?Eshaal Duby Meredith Leeds, MD  ?04/04/2022 2:21 PM    ? ?[facility] ?[address] ?[address] ?[place] [address] ?[phone] (office) ?[phone] (fax) ? ?

## 2022-04-11 ENCOUNTER — Emergency Department (HOSPITAL_COMMUNITY): Payer: PPO

## 2022-04-11 ENCOUNTER — Encounter (HOSPITAL_COMMUNITY): Payer: Self-pay

## 2022-04-11 ENCOUNTER — Other Ambulatory Visit: Payer: Self-pay

## 2022-04-11 ENCOUNTER — Emergency Department (HOSPITAL_COMMUNITY)
Admission: EM | Admit: 2022-04-11 | Discharge: 2022-04-13 | Disposition: A | Discharge status: Home / Self Care | Payer: PPO | Attending: Emergency Medicine | Admitting: Emergency Medicine

## 2022-04-11 ENCOUNTER — Other Ambulatory Visit: Payer: Self-pay | Admitting: Neurosurgery

## 2022-04-11 ENCOUNTER — Other Ambulatory Visit (HOSPITAL_COMMUNITY): Payer: Self-pay | Admitting: Neurosurgery

## 2022-04-11 DIAGNOSIS — Z7901 Long term (current) use of anticoagulants: Secondary | ICD-10-CM | POA: Diagnosis not present

## 2022-04-11 DIAGNOSIS — M5441 Lumbago with sciatica, right side: Secondary | ICD-10-CM | POA: Insufficient documentation

## 2022-04-11 DIAGNOSIS — M25551 Pain in right hip: Secondary | ICD-10-CM | POA: Diagnosis not present

## 2022-04-11 DIAGNOSIS — Z8546 Personal history of malignant neoplasm of prostate: Secondary | ICD-10-CM | POA: Diagnosis not present

## 2022-04-11 DIAGNOSIS — M961 Postlaminectomy syndrome, not elsewhere classified: Secondary | ICD-10-CM

## 2022-04-11 DIAGNOSIS — Z87442 Personal history of urinary calculi: Secondary | ICD-10-CM | POA: Diagnosis not present

## 2022-04-11 LAB — COMPREHENSIVE METABOLIC PANEL
ALT: 15 U/L (ref 0–44)
AST: 14 U/L — ABNORMAL LOW (ref 15–41)
Albumin: 3.4 g/dL — ABNORMAL LOW (ref 3.5–5.0)
Alkaline Phosphatase: 56 U/L (ref 38–126)
Anion gap: 0 — ABNORMAL LOW (ref 5–15)
BUN: 18 mg/dL (ref 8–23)
CO2: 23 mmol/L (ref 22–32)
Calcium: 7 mg/dL — ABNORMAL LOW (ref 8.9–10.3)
Chloride: 119 mmol/L — ABNORMAL HIGH (ref 98–111)
Creatinine, Ser: 1.02 mg/dL (ref 0.61–1.24)
GFR, Estimated: >60 mL/min (ref >60)
Glucose, Bld: 89 mg/dL (ref 70–99)
Potassium: 3.7 mmol/L (ref 3.5–5.1)
Sodium: 142 mmol/L (ref 135–145)
Total Bilirubin: 1.2 mg/dL (ref 0.3–1.2)
Total Protein: 5.4 g/dL — ABNORMAL LOW (ref 6.5–8.1)

## 2022-04-11 LAB — CBC WITH DIFFERENTIAL/PLATELET
Abs Immature Granulocytes: 0.02 10*3/uL (ref 0.00–0.07)
Basophils Absolute: 0 10*3/uL (ref 0.0–0.1)
Basophils Relative: 0 %
Eosinophils Absolute: 0.1 10*3/uL (ref 0.0–0.5)
Eosinophils Relative: 2 %
HCT: 26.7 % — ABNORMAL LOW (ref 39.0–52.0)
Hemoglobin: 8.8 g/dL — ABNORMAL LOW (ref 13.0–17.0)
Immature Granulocytes: 0 %
Lymphocytes Relative: 23 %
Lymphs Abs: 1.3 10*3/uL (ref 0.7–4.0)
MCH: 34.6 pg — ABNORMAL HIGH (ref 26.0–34.0)
MCHC: 33 g/dL (ref 30.0–36.0)
MCV: 105.1 fL — ABNORMAL HIGH (ref 80.0–100.0)
Monocytes Absolute: 0.7 10*3/uL (ref 0.1–1.0)
Monocytes Relative: 13 %
Neutro Abs: 3.4 10*3/uL (ref 1.7–7.7)
Neutrophils Relative %: 62 %
Platelets: 244 10*3/uL (ref 150–400)
RBC: 2.54 MIL/uL — ABNORMAL LOW (ref 4.22–5.81)
RDW: 23.1 % — ABNORMAL HIGH (ref 11.5–15.5)
WBC: 5.6 10*3/uL (ref 4.0–10.5)
nRBC: 0 % (ref 0.0–0.2)

## 2022-04-11 LAB — URINALYSIS, ROUTINE W REFLEX MICROSCOPIC
Bilirubin Urine: NEGATIVE
Glucose, UA: NEGATIVE mg/dL
Hgb urine dipstick: NEGATIVE
Ketones, ur: NEGATIVE mg/dL
Leukocytes,Ua: NEGATIVE
Nitrite: NEGATIVE
Protein, ur: NEGATIVE mg/dL
Specific Gravity, Urine: 1.017 (ref 1.005–1.030)
pH: 7 (ref 5.0–8.0)

## 2022-04-11 LAB — LIPASE, BLOOD: Lipase: 28 U/L (ref 11–51)

## 2022-04-11 MED ORDER — LORAZEPAM 2 MG/ML IJ SOLN
0.5000 mg | Freq: Once | INTRAMUSCULAR | Status: AC
  Administered 2022-04-12: 0.5 mg via INTRAVENOUS
  Filled 2022-04-11: qty 1

## 2022-04-11 MED ORDER — IOHEXOL 300 MG/ML  SOLN
100.0000 mL | Freq: Once | INTRAMUSCULAR | Status: AC | PRN
  Administered 2022-04-11: 100 mL via INTRAVENOUS

## 2022-04-11 MED ORDER — OXYCODONE-ACETAMINOPHEN 5-325 MG PO TABS
1.0000 | ORAL_TABLET | Freq: Once | ORAL | Status: AC
  Administered 2022-04-11: 1 via ORAL
  Filled 2022-04-11: qty 1

## 2022-04-11 MED ORDER — PROMETHAZINE HCL 25 MG PO TABS
25.0000 mg | ORAL_TABLET | Freq: Once | ORAL | Status: AC
  Administered 2022-04-11: 25 mg via ORAL
  Filled 2022-04-11: qty 1

## 2022-04-11 MED ORDER — HYDROMORPHONE HCL 1 MG/ML IJ SOLN
1.0000 mg | Freq: Once | INTRAMUSCULAR | Status: AC
  Administered 2022-04-11: 1 mg via INTRAVENOUS
  Filled 2022-04-11: qty 1

## 2022-04-11 NOTE — ED Notes (Signed)
Pt is aware that a urine sample is needed, responded with saying that he did not have the urge. ?

## 2022-04-11 NOTE — ED Provider Notes (Signed)
?[facility] ?Provider Note ? ? ?CSN: [id number] ?Arrival date & time: 04/11/22  1051 ? ?  ? ?History ? ?Chief Complaint  ?Patient presents with  ? Hip Pain  ? ? ?Todd Salazar is a 86 y.o. male. ? ?HPI ? ?  ? ? ? ? ?86 year old male with a history of atrial fibrillation on Eliquis, prostate cancer, nephrolithiasis, presents with concern for 2 weeks of worsening right flank and hip pain.  He was seen in the emergency department on April 24 and had a CT renal stone study, hip x-ray, and CT L-spine no charge which showed no evidence of acute abnormalities.  Reports that his symptoms have significantly worsened over the last 2 to 3 days, and he is unable to bear weight or ambulate at all.  Reports when he is laying down, he is able to lift his leg, however when he goes to sit up, he has too much pain when he tries to lift his leg to stand up, and when he does stand has significant pain with weightbearing.  He recently saw his spine doctor, who had placed orders for MRI, and did not discussed ordering a CT with contrast, however the order was not put in. ? ?His pain is severe, 10 out of 10 with attempting to stand, he has been unable to walk.  He lives alone, with his daughter living at home nearby.  Every time he tries to take prednisone or pain medication, he throws up.  Yesterday he was able to take 1 dose of pain medication with Zofran and keep it down, however in the afternoon when he took both the pain medication and the Zofran he vomited again.  They are concerned by his inability to tolerate p.o. medications and worsening pain. ? ?He has not had fever, otherwise not had nausea or vomiting, no diarrhea, constipation, loss of control of his bowel or bladder, dysuria.  Denies numbness or weakness, reports more of pain limiting his ability to ambulate and make his right leg feel weak. ? ?Past Medical History:  ?Diagnosis Date  ? Arthritis   ? Atrial fibrillation ([place])   ? Cancer [place])    ? prostate  ? Dysrhythmia   ? afib  ? History of kidney stones   ?  ?Home Medications ?Prior to Admission medications   ?Medication Sig Start Date End Date Taking? Authorizing Provider  ?apixaban (ELIQUIS) 5 MG TABS tablet Take 1 tablet (5 mg total) by mouth 2 (two) times daily. 03/18/22   Camnitz, Ocie Doyne, MD  ?diclofenac Sodium (VOLTAREN) 1 % GEL Apply 4 g topically 4 (four) times daily. 04/04/22   [name], DO  ?gabapentin (NEURONTIN) 100 MG capsule Take 200 mg by mouth at bedtime as needed. 01/24/22   [provider]  ?methylPREDNISolone (MEDROL DOSEPAK) 4 MG TBPK tablet Day 1: '8mg'$  before breakfast, 4 mg after lunch, 4 mg after supper, and 8 mg at bedtime Day 2: 4 mg before breakfast, 4 mg after lunch, 4 mg  after supper, and 8 mg  at bedtime Day 3:  4 mg  before breakfast, 4 mg  after lunch, 4 mg after supper, and 4 mg  at bedtime Day 4: 4 mg  before breakfast, 4 mg  after lunch, and 4 mg at bedtime Day 5: 4 mg  before breakfast and 4 mg at bedtime Day 6: 4 mg  before breakfast 04/04/22   [name], DO  ?morphine (MSIR) 15 MG tablet Take 0.5 tablets (7.5  mg total) by mouth every 4 (four) hours as needed for severe pain. 04/04/22   [name], DO  ?nitroGLYCERIN (NITROSTAT) 0.4 MG SL tablet Place 0.4 mg under the tongue every 5 (five) minutes as needed for chest pain.  07/25/19   [provider]  ?   ? ?Allergies    ?Ciprofloxacin, Tramadol, Duloxetine, Hydrocodone-acetaminophen, Benadryl [diphenhydramine], Nisoldipine, Methocarbamol, Ondansetron hcl, and Sulfa antibiotics   ? ?Review of Systems   ?Review of Systems ? ?Physical Exam ?Updated Vital Signs ?BP 119/60   Pulse 67   Temp 97.9 ?F (36.6 ?C) (Oral)   Resp 20   Ht '6\' 3"'$  (1.905 m)   Wt 86.2 kg   SpO2 94%   BMI 23.75 kg/m?  ?Physical Exam ?Vitals and nursing note reviewed.  ?Constitutional:   ?   General: He is not in acute distress. ?   Appearance: He is well-developed. He is not diaphoretic.  ?HENT:  ?   Head: Normocephalic and  atraumatic.  ?Eyes:  ?   Conjunctiva/sclera: Conjunctivae normal.  ?Cardiovascular:  ?   Rate and Rhythm: Normal rate and regular rhythm.  ?   Heart sounds: Normal heart sounds. No murmur heard. ?  No friction rub. No gallop.  ?Pulmonary:  ?   Effort: Pulmonary effort is normal. No respiratory distress.  ?   Breath sounds: Normal breath sounds. No wheezing or rales.  ?Abdominal:  ?   General: There is no distension.  ?   Palpations: Abdomen is soft.  ?   Tenderness: There is no abdominal tenderness. There is right CVA tenderness. There is no guarding.  ?Musculoskeletal:     ?   General: Tenderness (right pelvis) present.  ?   Cervical back: Normal range of motion.  ?   Comments: Able to flex at hip without pain, normal strength, ?Pain with external rotation of hip, and intermittent pain with movement ?No erythema ?Normal strength with hip flexion, dorsi/plantar flexion, knee extension/flexion  ?Skin: ?   General: Skin is warm and dry.  ?Neurological:  ?   Mental Status: He is alert and oriented to person, place, and time.  ? ? ?ED Results / Procedures / Treatments   ?Labs ?(all labs ordered are listed, but only abnormal results are displayed) ?Labs Reviewed  ?CBC WITH DIFFERENTIAL/PLATELET - Abnormal; Notable for the following components:  ?    Result Value  ? RBC 2.54 (*)   ? Hemoglobin 8.8 (*)   ? HCT 26.7 (*)   ? MCV 105.1 (*)   ? MCH 34.6 (*)   ? RDW 23.1 (*)   ? All other components within normal limits  ?COMPREHENSIVE METABOLIC PANEL - Abnormal; Notable for the following components:  ? Chloride 119 (*)   ? Calcium 7.0 (*)   ? Total Protein 5.4 (*)   ? Albumin 3.4 (*)   ? AST 14 (*)   ? Anion gap 0 (*)   ? All other components within normal limits  ?LIPASE, BLOOD  ?URINALYSIS, ROUTINE W REFLEX MICROSCOPIC  ? ? ?EKG ?None ? ?Radiology ?CT ABDOMEN PELVIS W CONTRAST ? ?Result Date: 04/11/2022 ?CLINICAL DATA:  Flank pain EXAM: CT ABDOMEN AND PELVIS WITH CONTRAST TECHNIQUE: Multidetector CT imaging of the abdomen and  pelvis was performed using the standard protocol following bolus administration of intravenous contrast. RADIATION DOSE REDUCTION: This exam was performed according to the departmental dose-optimization program which includes automated exposure control, adjustment of the mA and/or kV according to patient size and/or use of  iterative reconstruction technique. CONTRAST:  188m OMNIPAQUE IOHEXOL 300 MG/ML  SOLN COMPARISON:  Renal stone CT dated April 04, 2022 FINDINGS: Lower chest: Mild bibasilar opacities, likely due to atelectasis. Partially visualized pacer lead seen in the right ventricle. Hepatobiliary: No suspicious liver lesions. Gallbladder is surgically absent. Mild biliary ductal dilation, unchanged compared to prior exam and not unexpected status post cholecystectomy. Pancreas: Unremarkable. No pancreatic ductal dilatation or surrounding inflammatory changes. Spleen: Normal in size without focal abnormality. Adrenals/Urinary Tract: Bilateral adrenal glands are unremarkable. No hydronephrosis. Nonobstructing stone of the lower pole of the left kidney. Unchanged bilateral simple cysts. Bladder is unremarkable. Penile implant reservoir noted anteriorly in the pelvis. Stomach/Bowel: Stomach is within normal limits. Appendix appears normal. No evidence of bowel wall thickening, distention, or inflammatory changes. Vascular/Lymphatic: Aortic atherosclerosis. No enlarged abdominal or pelvic lymph nodes. Reproductive: Prostate is not well visualized due to streak artifact from bilateral hip arthroplasties. Other: No abdominopelvic ascites. Musculoskeletal: Bilateral hip arthroplasties. No aggressive appearing osseous lesions. IMPRESSION: 1. No acute findings in the abdomen or pelvis, including no evidence of obstructive uropathy or renal infarct. 2. Nonobstructing stone of the lower pole of the left kidney. 3.  Aortic Atherosclerosis (ICD10-I70.0). Electronically Signed   By: LYetta GlassmanM.D.   On: 04/11/2022  15:14   ? ?Procedures ?Procedures  ? ? ?Medications Ordered in ED ?Medications  ?promethazine (PHENERGAN) tablet 25 mg (has no administration in time range)  ?LORazepam (ATIVAN) injection 0.5 mg (has no admin

## 2022-04-11 NOTE — ED Triage Notes (Addendum)
Pt BIB EMS from home c/o right hip pain x 2 weeks, unable to walk due to pain. Pt denies recent falls or trauma. Pt given 150 mcg Fentanyl, '4mg'$  Zofran en route ?

## 2022-04-11 NOTE — ED Provider Notes (Addendum)
?  Physical Exam  ?BP 114/64   Pulse 66   Temp 97.9 ?F (36.6 ?C) (Oral)   Resp 10   Ht '6\' 3"'$  (1.905 m)   Wt 86.2 kg   SpO2 98%   BMI 23.75 kg/m?  ? ?Physical Exam ? ?Procedures  ?Procedures ? ?ED Course / MDM  ?  ?Medical Decision Making ?Amount and/or Complexity of Data Reviewed ?Labs: ordered. ?Radiology: ordered. ? ?Risk ?Prescription drug management. ? ? ?86 y/o active man with recent hip/flank/back pain. ?Has seen his ortho spine team and had CT non contrast ordered - negative for acute changes. ?Now unable to walk due to pain. ?MRI L spine and R hip ordered. ?Pt on apixaban for AF. ? ?CT shows: ?IMPRESSION: ?1. No acute findings in the abdomen or pelvis, including no evidence ?of obstructive uropathy or renal infarct. ?2. Nonobstructing stone of the lower pole of the left kidney. ?3.  Aortic Atherosclerosis (ICD10-I70.0). ? ?- f/u MRI ?- focus on pain control ?- Reassess for placement ? ?  ?  ?Varney Biles, MD ?04/11/22 1532 ? ?9:42 PM ?Patient MRI will be done at [facility] given the pacemaker. ?Dr. Tyrone Nine excepting at [facility].  Patient made aware. ?  ?Varney Biles, MD ?04/11/22 2142 ? ?

## 2022-04-11 NOTE — ED Notes (Signed)
Patient will stay at our facility until MRI is here in the AM - patient has a pace maker so MRI is not being performed currently  ?

## 2022-04-12 ENCOUNTER — Emergency Department (HOSPITAL_COMMUNITY): Payer: PPO

## 2022-04-12 MED ORDER — GADOBUTROL 1 MMOL/ML IV SOLN
8.0000 mL | Freq: Once | INTRAVENOUS | Status: AC | PRN
  Administered 2022-04-12: 8 mL via INTRAVENOUS

## 2022-04-12 MED ORDER — HYDROMORPHONE HCL 1 MG/ML IJ SOLN
1.0000 mg | Freq: Once | INTRAMUSCULAR | Status: AC
  Administered 2022-04-12: 1 mg via INTRAVENOUS
  Filled 2022-04-12: qty 1

## 2022-04-12 MED ORDER — OXYCODONE-ACETAMINOPHEN 5-325 MG PO TABS: 1.0000 | ORAL_TABLET | Freq: Once | ORAL | Status: DC

## 2022-04-12 MED ORDER — PROMETHAZINE HCL 25 MG PO TABS
25.0000 mg | ORAL_TABLET | Freq: Once | ORAL | Status: AC
  Administered 2022-04-12: 25 mg via ORAL
  Filled 2022-04-12: qty 1

## 2022-04-12 MED ORDER — HYDROMORPHONE HCL 1 MG/ML IJ SOLN
1.0000 mg | INTRAMUSCULAR | Status: AC | PRN
  Administered 2022-04-12 (×2): 1 mg via INTRAVENOUS
  Filled 2022-04-12 (×2): qty 1

## 2022-04-12 MED ORDER — MORPHINE SULFATE 15 MG PO TABS: 15.0000 mg | ORAL_TABLET | Freq: Four times a day (QID) | ORAL | 0 refills | Status: DC | PRN

## 2022-04-12 NOTE — ED Notes (Signed)
Patient returned back from MRI at this time.  °

## 2022-04-12 NOTE — ED Notes (Signed)
Meal tray provided at this time.

## 2022-04-12 NOTE — ED Notes (Signed)
[facility] called for transport.  

## 2022-04-12 NOTE — ED Notes (Signed)
MD at bedside updating patient/family 

## 2022-04-12 NOTE — ED Provider Notes (Addendum)
?  Physical Exam  ?BP 93/61   Pulse (!) 58   Temp 97.9 ?F (36.6 ?C) (Oral)   Resp 12   Ht '6\' 3"'$  (1.905 m)   Wt 86.2 kg   SpO2 91%   BMI 23.75 kg/m?  ? ? ? ?Procedures  ?Procedures ? ?ED Course / MDM  ?  ?Medical Decision Making ?Amount and/or Complexity of Data Reviewed ?Labs: ordered. ?Radiology: ordered. ? ?Risk ?Prescription drug management. ? ? ?86 y/o active man with recent hip/flank/back pain. ?Has seen his ortho spine team and had CT non contrast ordered - negative for acute changes. ?Now unable to walk due to pain. ?MRI L spine and R hip ordered. ?Pt on apixaban for AF. ? ?CT shows: ?IMPRESSION: ?1. No acute findings in the abdomen or pelvis, including no evidence ?of obstructive uropathy or renal infarct. ?2. Nonobstructing stone of the lower pole of the left kidney. ?3.  Aortic Atherosclerosis (ICD10-I70.0). ? ?- f/u MRI ?- focus on pain control ?- Reassess for placement ? ? ?Patient MRI to be done at [facility] due to his pacemaker. Boarded overnight in the ED at [facility]  ? ?8:49AM ?Informed by nursing that confirmation of ED to ED transfer will happen likely in the next 20 minutes. Discussed the plan of care with the patient and his daughter bedside. She has been having difficulty with him experiencing nausea and vomiting and inability to tolerate his oral pain medications. His daughter does not think he will be able to safely ambulate and conduct his ADLs at home given his current condition. Will need placement likely at a SNF. PT/OT evaluation orders placed. Plan to follow-up MRI results, PT/OT, social work recommendations. Dr. Francia Greaves contacted and accepted the patient in ED-to-ED transfer. Reordered Dilaudid IV and Phenergan tablet.  ?  ?9:22AM ?Charge informed me that radiology intends to keep the patient at [facility] for MRI imaging. Plan for MRI around 2pm. Symptoms have been ongoing for the past two weeks, negative CT imaging, low concern for acute process that would require emergency surgery  today. Diet order placed. ? ? ? ? ? ?  ?Regan Lemming, MD ?04/12/22 [phone] ? ?

## 2022-04-12 NOTE — ED Notes (Signed)
Patient transported to MRI, accompanied by RN ?

## 2022-04-12 NOTE — Discharge Instructions (Addendum)
Follow up with neurosurgery, take medicine as prescribed. ?

## 2022-04-12 NOTE — ED Provider Notes (Signed)
Patient signed out to me by previous provider. Please refer to their note for full HPI.  Briefly this is an 86 year old male with chronic lower back pain who follows with neurosurgery, presented with worsening lower back pain and difficulty with ambulation, coming from home.  Has been delayed evaluation due to needing an MRI in the setting of a pacemaker.  We have a rep now and we are able to get the study done so he was signed out pending MRI.  Is currently receiving pain control here in the department. ?Physical Exam  ?BP 102/60   Pulse 63   Temp 97.9 ?F (36.6 ?C) (Oral)   Resp 20   Ht '6\' 3"'$  (1.905 m)   Wt 86.2 kg   SpO2 100%   BMI 23.75 kg/m?  ? ?Physical Exam ? ?Procedures  ?Procedures ? ?ED Course / MDM  ?  ?Medical Decision Making ?Amount and/or Complexity of Data Reviewed ?Labs: ordered. ?Radiology: ordered. ? ?Risk ?Prescription drug management. ? ? ?MRI shows disc and facet degeneration with foraminal neural stenosis, no high-grade spinal stenosis.  Family was under the impression that Dr. Ronnald Ramp had sent the patient here directly for more of an admission/treatment standpoint.  I reached out to the neurosurgeon on-call, Dr. Venetia Constable and there was no knowledge or plan for this and based off of the MRI results there is no indication for acute surgical intervention.  Patient is currently pain controlled but family is concerned for his immobile state secondary to pain.  Transition of care was involved and we recommended SNF placement given his degree of debility secondary to pain.  However the patient is refusing rehab placement.  He understands that by going home he is going to have significant issues with mobility and that his family will be taking on this burden.  We will arrange for home health aide and evaluation however I believe the patient needs to be in rehab.  Patient continues to decline, he has capacity to make this decision.  We are unable to admit for pain control.  Family has been updated  by myself and transition of care in regards to the plan for discharge home, pain control at home and follow-up with nurse for surgery tomorrow.  He is being considered for a spinal stimulator.  Patient at this time appears safe and stable for discharge and close outpatient follow up. Discharge plan and strict return to ED precautions discussed, patient verbalizes understanding and agreement. ? ? ? ? ?  ?Lorelle Gibbs, DO ?04/12/22 2336 ? ?

## 2022-04-12 NOTE — Progress Notes (Signed)
Transition of Care [facility]) - Emergency Department Mini Assessment ? ? ?Patient Details  ?Name: Todd Salazar ?MRN: [id number] ?Date of Birth: 10/06/1936 ? ?Transition of Care (TOC) CM/SW Contact:    ?Rodney Booze, LCSW ?Phone Number: ?04/12/2022, 9:39 PM ? ? ?Clinical Narrative: ? ? ? ?ED Mini Assessment: ?What brought you to the Emergency Department? : (P) Pt is suffering from back and hip pain can not keep pain med down ? ?Barriers to Discharge: (P) No Barriers Identified ? ?Barrier interventions: (P) Pts family does not want him discharged due to his pain CSW and provider explained to the Pt and the family that he can not stay in the ED for Pain Manangment. provider advised family to follow up with spine doctor as the MRI showed no immediate issues that Pt could be admitted for. Pt and family refused SNF also family want to consider home health aid for the patient. Provider is planning to reach out to on call Neuro to make sure the Pt. does not have any other pressing concerns that were not noted, however the Pt will be discharged if Neuro does not have any more concerns for the Pt Pt will follow up with specialist after discharge. The patient will need to go home by hospital transportion. ? ?Means of departure: [facility]) Hospital Transport ? ?Interventions which prevented an admission or readmission: (P) Transportation Screening, Follow-up medical appointment, Home Health Consult or Services (CSW spoke to family about other options refused SNF) ? ? ? ?Patient Contact and Communications ?  ?  ?Spoke with: (P) Dalene Seltzer [phone] ?Contact Date: (P) 04/12/22,   Contact time: (P) 0845 ?  ?  ? ?Patient states their goals for this hospitalization and ongoing recovery are:: (P) I would like to not be in this pain Thursday the Pt reports being able to do things with limited assit now the Pt. is not able to move at all by him self dead weight the family reported. ?CMS Medicare.gov Compare Post Acute Care list provided  to:: (P) Patient ?Choice offered to / list presented to : (P) Patient ? ?Admission diagnosis:  right hip pain ?Patient Active Problem List  ? Diagnosis Date Noted  ? Secondary hypercoagulable state ([place]) 08/27/2021  ? Persistent atrial fibrillation ([place]) 08/17/2021  ? S/P lumbar fusion 11/25/2019  ? Carcinoma of prostate ([place]) 09/07/2019  ? Lumbar radiculopathy 09/07/2019  ? Degeneration of lumbar intervertebral disc 09/07/2019  ? History of total replacement of both hip joints 08/01/2019  ? Iliotibial band syndrome of right side 07/27/2019  ? Macrocytic anemia 07/26/2019  ? Hypotension 07/26/2019  ? Bronchiectasis without complication ([place]) 74/25/9563  ? DOE (dyspnea on exertion) 10/19/2018  ? Lung nodule < 6cm on CT 10/09/2018  ? Occipital neuritis 08/29/2016  ? Bilateral hearing loss 06/22/2016  ? Post herpetic neuralgia 02/02/2016  ? Current use of long term anticoagulation 12/18/2015  ? Calcification of spleen 09/13/2014  ? Diverticulosis 09/13/2014  ? Bilateral renal cysts 09/13/2014  ? 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 Hemangioma of liver 09/13/2014  ? Male erectile dysfunction, unspecified 09/13/2014  ? 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 Paraphimosis 09/13/2014  ? Polyp of colon 09/13/2014  ? Restless legs syndrome 09/13/2014  ? Spondylosis without myelopathy or radiculopathy, cervical region 09/13/2014  ? Urethral stricture 09/13/2014  ? Spondylosis without myelopathy or radiculopathy, lumbosacral region 09/13/2014  ? Anxiety 09/13/2014  ? Chest pain at rest 12/11/2011  ?  Class: Acute  ? CAD (coronary artery disease), native coronary artery  12/11/2011  ?  Class: Chronic  ? HTN (hypertension) 12/11/2011  ? Paroxysmal atrial fibrillation ([place]) 12/11/2011  ? ?PCP:  [name], DO ?Pharmacy:   ?[place], [place] - [address] ?[place] ?[place] [address] ?Phone: [phone] Fax: [phone] ? ?[id number] [place], [place] - [address]?Phone: [phone]: [phone]? ?[id number] [place], [place] - [address] ?[id number]: [phone]: [phone]  ?

## 2022-04-12 NOTE — ED Notes (Signed)
Call placed to radiology in regards to pending MRI, awaiting the verification on transfer to [facility] due to pacemaker.  ?

## 2022-04-13 ENCOUNTER — Telehealth: Payer: Self-pay

## 2022-04-13 NOTE — ED Notes (Signed)
[facility] arrived for transport 

## 2022-04-13 NOTE — Telephone Encounter (Signed)
This RNCM spoke with patient's daughter Cinda Quest [phone] regarding patient needs HHA (per note from [facility] [place]). ? Patient's daughter Cinda Quest reports the patient needs assistance with getting oob, eating, daily adls. This RNCM explained private duty nursing option and will send referral to [facility].  ?RNCM will email resources to patient's daughter Cinda Quest email: [email or web address] ? ? ? ?     ?

## 2022-04-15 ENCOUNTER — Encounter (HOSPITAL_COMMUNITY): Payer: Self-pay

## 2022-04-15 ENCOUNTER — Other Ambulatory Visit: Payer: Self-pay

## 2022-04-15 ENCOUNTER — Inpatient Hospital Stay (HOSPITAL_COMMUNITY)
Admission: EM | Admit: 2022-04-15 | Discharge: 2022-04-21 | DRG: 556 | Disposition: A | Discharge status: Home Health | Payer: PPO | Attending: Internal Medicine | Admitting: Internal Medicine

## 2022-04-15 DIAGNOSIS — Z881 Allergy status to other antibiotic agents status: Secondary | ICD-10-CM

## 2022-04-15 DIAGNOSIS — R001 Bradycardia, unspecified: Secondary | ICD-10-CM | POA: Diagnosis present

## 2022-04-15 DIAGNOSIS — Z87891 Personal history of nicotine dependence: Secondary | ICD-10-CM

## 2022-04-15 DIAGNOSIS — Z79899 Other long term (current) drug therapy: Secondary | ICD-10-CM

## 2022-04-15 DIAGNOSIS — D649 Anemia, unspecified: Secondary | ICD-10-CM

## 2022-04-15 DIAGNOSIS — R1110 Vomiting, unspecified: Secondary | ICD-10-CM | POA: Diagnosis present

## 2022-04-15 DIAGNOSIS — I4819 Other persistent atrial fibrillation: Secondary | ICD-10-CM | POA: Diagnosis present

## 2022-04-15 DIAGNOSIS — Z9049 Acquired absence of other specified parts of digestive tract: Secondary | ICD-10-CM

## 2022-04-15 DIAGNOSIS — M25551 Pain in right hip: Principal | ICD-10-CM

## 2022-04-15 DIAGNOSIS — Z885 Allergy status to narcotic agent status: Secondary | ICD-10-CM

## 2022-04-15 DIAGNOSIS — Z8546 Personal history of malignant neoplasm of prostate: Secondary | ICD-10-CM

## 2022-04-15 DIAGNOSIS — Z8249 Family history of ischemic heart disease and other diseases of the circulatory system: Secondary | ICD-10-CM

## 2022-04-15 DIAGNOSIS — N1831 Chronic kidney disease, stage 3a: Secondary | ICD-10-CM | POA: Diagnosis present

## 2022-04-15 DIAGNOSIS — Z87442 Personal history of urinary calculi: Secondary | ICD-10-CM

## 2022-04-15 DIAGNOSIS — Z7901 Long term (current) use of anticoagulants: Secondary | ICD-10-CM

## 2022-04-15 DIAGNOSIS — N179 Acute kidney failure, unspecified: Secondary | ICD-10-CM | POA: Diagnosis present

## 2022-04-15 DIAGNOSIS — R52 Pain, unspecified: Secondary | ICD-10-CM | POA: Diagnosis present

## 2022-04-15 DIAGNOSIS — R112 Nausea with vomiting, unspecified: Secondary | ICD-10-CM | POA: Diagnosis present

## 2022-04-15 DIAGNOSIS — D509 Iron deficiency anemia, unspecified: Secondary | ICD-10-CM | POA: Diagnosis present

## 2022-04-15 DIAGNOSIS — M1990 Unspecified osteoarthritis, unspecified site: Secondary | ICD-10-CM | POA: Diagnosis present

## 2022-04-15 DIAGNOSIS — Z950 Presence of cardiac pacemaker: Secondary | ICD-10-CM

## 2022-04-15 DIAGNOSIS — Z981 Arthrodesis status: Secondary | ICD-10-CM

## 2022-04-15 DIAGNOSIS — Z888 Allergy status to other drugs, medicaments and biological substances status: Secondary | ICD-10-CM

## 2022-04-15 DIAGNOSIS — G8929 Other chronic pain: Secondary | ICD-10-CM | POA: Diagnosis present

## 2022-04-15 DIAGNOSIS — Z96643 Presence of artificial hip joint, bilateral: Secondary | ICD-10-CM | POA: Diagnosis present

## 2022-04-15 LAB — CBC
HCT: 31.1 % — ABNORMAL LOW (ref 39.0–52.0)
Hemoglobin: 10.4 g/dL — ABNORMAL LOW (ref 13.0–17.0)
MCH: 34.2 pg — ABNORMAL HIGH (ref 26.0–34.0)
MCHC: 33.4 g/dL (ref 30.0–36.0)
MCV: 102.3 fL — ABNORMAL HIGH (ref 80.0–100.0)
Platelets: 278 10*3/uL (ref 150–400)
RBC: 3.04 MIL/uL — ABNORMAL LOW (ref 4.22–5.81)
RDW: 22.9 % — ABNORMAL HIGH (ref 11.5–15.5)
WBC: 5 10*3/uL (ref 4.0–10.5)
nRBC: 0.4 % — ABNORMAL HIGH (ref 0.0–0.2)

## 2022-04-15 LAB — BASIC METABOLIC PANEL
Anion gap: 7 (ref 5–15)
BUN: 17 mg/dL (ref 8–23)
CO2: 24 mmol/L (ref 22–32)
Calcium: 9.2 mg/dL (ref 8.9–10.3)
Chloride: 110 mmol/L (ref 98–111)
Creatinine, Ser: 1.42 mg/dL — ABNORMAL HIGH (ref 0.61–1.24)
GFR, Estimated: 49 mL/min — ABNORMAL LOW (ref >60)
Glucose, Bld: 99 mg/dL (ref 70–99)
Potassium: 4.7 mmol/L (ref 3.5–5.1)
Sodium: 141 mmol/L (ref 135–145)

## 2022-04-15 LAB — URINALYSIS, ROUTINE W REFLEX MICROSCOPIC
Bilirubin Urine: NEGATIVE
Glucose, UA: NEGATIVE mg/dL
Hgb urine dipstick: NEGATIVE
Ketones, ur: NEGATIVE mg/dL
Leukocytes,Ua: NEGATIVE
Nitrite: NEGATIVE
Protein, ur: NEGATIVE mg/dL
Specific Gravity, Urine: 1.016 (ref 1.005–1.030)
pH: 6 (ref 5.0–8.0)

## 2022-04-15 NOTE — ED Triage Notes (Signed)
Received 40mg fentanyl from ems ?

## 2022-04-15 NOTE — ED Triage Notes (Signed)
Pt has had  right sided back pain N/V  for two weeks. Pt als o c/o generalized weakness. Pt was seen at [facility] long and given zofran but is till unable to keep anything down. Has history of kidney stone and had left side stone at this time.  ?

## 2022-04-16 ENCOUNTER — Encounter (HOSPITAL_COMMUNITY): Payer: Self-pay | Admitting: Family Medicine

## 2022-04-16 DIAGNOSIS — N179 Acute kidney failure, unspecified: Secondary | ICD-10-CM

## 2022-04-16 DIAGNOSIS — N1831 Chronic kidney disease, stage 3a: Secondary | ICD-10-CM | POA: Diagnosis present

## 2022-04-16 DIAGNOSIS — M25551 Pain in right hip: Principal | ICD-10-CM

## 2022-04-16 DIAGNOSIS — D649 Anemia, unspecified: Secondary | ICD-10-CM

## 2022-04-16 DIAGNOSIS — R1110 Vomiting, unspecified: Secondary | ICD-10-CM | POA: Diagnosis present

## 2022-04-16 DIAGNOSIS — I4819 Other persistent atrial fibrillation: Secondary | ICD-10-CM

## 2022-04-16 DIAGNOSIS — R112 Nausea with vomiting, unspecified: Secondary | ICD-10-CM

## 2022-04-16 DIAGNOSIS — R52 Pain, unspecified: Secondary | ICD-10-CM | POA: Diagnosis present

## 2022-04-16 LAB — RETICULOCYTES
Immature Retic Fract: 16.4 % — ABNORMAL HIGH (ref 2.3–15.9)
RBC.: 2.94 MIL/uL — ABNORMAL LOW (ref 4.22–5.81)
Retic Count, Absolute: 40 10*3/uL (ref 19.0–186.0)
Retic Ct Pct: 1.4 % (ref 0.4–3.1)

## 2022-04-16 LAB — VITAMIN B12: Vitamin B-12: 641 pg/mL (ref 180–914)

## 2022-04-16 LAB — CBC
HCT: 30.7 % — ABNORMAL LOW (ref 39.0–52.0)
Hemoglobin: 10.3 g/dL — ABNORMAL LOW (ref 13.0–17.0)
MCH: 34.9 pg — ABNORMAL HIGH (ref 26.0–34.0)
MCHC: 33.6 g/dL (ref 30.0–36.0)
MCV: 104.1 fL — ABNORMAL HIGH (ref 80.0–100.0)
Platelets: 249 10*3/uL (ref 150–400)
RBC: 2.95 MIL/uL — ABNORMAL LOW (ref 4.22–5.81)
RDW: 23.3 % — ABNORMAL HIGH (ref 11.5–15.5)
WBC: 5.6 10*3/uL (ref 4.0–10.5)
nRBC: 0.4 % — ABNORMAL HIGH (ref 0.0–0.2)

## 2022-04-16 LAB — RENAL FUNCTION PANEL
Albumin: 4.1 g/dL (ref 3.5–5.0)
Anion gap: 5 (ref 5–15)
BUN: 18 mg/dL (ref 8–23)
CO2: 25 mmol/L (ref 22–32)
Calcium: 8.9 mg/dL (ref 8.9–10.3)
Chloride: 109 mmol/L (ref 98–111)
Creatinine, Ser: 1.44 mg/dL — ABNORMAL HIGH (ref 0.61–1.24)
GFR, Estimated: 48 mL/min — ABNORMAL LOW (ref >60)
Glucose, Bld: 120 mg/dL — ABNORMAL HIGH (ref 70–99)
Phosphorus: 2.8 mg/dL (ref 2.5–4.6)
Potassium: 4.2 mmol/L (ref 3.5–5.1)
Sodium: 139 mmol/L (ref 135–145)

## 2022-04-16 LAB — IRON AND TIBC
Iron: 69 ug/dL (ref 45–182)
Saturation Ratios: 31 % (ref 17.9–39.5)
TIBC: 224 ug/dL — ABNORMAL LOW (ref 250–450)
UIBC: 155 ug/dL

## 2022-04-16 LAB — MAGNESIUM: Magnesium: 1.9 mg/dL (ref 1.7–2.4)

## 2022-04-16 LAB — C-REACTIVE PROTEIN: CRP: 0.7 mg/dL (ref <1.0)

## 2022-04-16 LAB — FOLATE: Folate: 13.8 ng/mL (ref >5.9)

## 2022-04-16 LAB — FERRITIN: Ferritin: 632 ng/mL — ABNORMAL HIGH (ref 24–336)

## 2022-04-16 LAB — SEDIMENTATION RATE: Sed Rate: 20 mm/hr — ABNORMAL HIGH (ref 0–16)

## 2022-04-16 MED ORDER — HYDROMORPHONE HCL 1 MG/ML IJ SOLN
0.5000 mg | INTRAMUSCULAR | Status: DC | PRN
  Administered 2022-04-16 – 2022-04-17 (×2): 0.5 mg via INTRAVENOUS
  Filled 2022-04-16 (×2): qty 0.5

## 2022-04-16 MED ORDER — ACETAMINOPHEN 325 MG PO TABS
650.0000 mg | ORAL_TABLET | Freq: Four times a day (QID) | ORAL | Status: DC
  Administered 2022-04-16 – 2022-04-21 (×10): 650 mg via ORAL
  Filled 2022-04-16 (×12): qty 2

## 2022-04-16 MED ORDER — FENTANYL CITRATE PF 50 MCG/ML IJ SOSY
50.0000 ug | PREFILLED_SYRINGE | Freq: Once | INTRAMUSCULAR | Status: AC
  Administered 2022-04-16: 50 ug via INTRAVENOUS
  Filled 2022-04-16: qty 1

## 2022-04-16 MED ORDER — POLYETHYLENE GLYCOL 3350 17 G PO PACK
17.0000 g | PACK | Freq: Every day | ORAL | Status: DC | PRN
  Administered 2022-04-20: 17 g via ORAL
  Filled 2022-04-16: qty 1

## 2022-04-16 MED ORDER — GABAPENTIN 100 MG PO CAPS
100.0000 mg | ORAL_CAPSULE | Freq: Every evening | ORAL | Status: DC | PRN
  Administered 2022-04-20: 100 mg via ORAL
  Filled 2022-04-16: qty 1

## 2022-04-16 MED ORDER — SENNA 8.6 MG PO TABS
1.0000 | ORAL_TABLET | Freq: Every day | ORAL | Status: DC
  Administered 2022-04-16 – 2022-04-21 (×6): 8.6 mg via ORAL
  Filled 2022-04-16 (×6): qty 1

## 2022-04-16 MED ORDER — LIDOCAINE 5 % EX PTCH
1.0000 | MEDICATED_PATCH | CUTANEOUS | Status: DC
  Administered 2022-04-16 – 2022-04-21 (×6): 1 via TRANSDERMAL
  Filled 2022-04-16 (×6): qty 1

## 2022-04-16 MED ORDER — APIXABAN 5 MG PO TABS
5.0000 mg | ORAL_TABLET | Freq: Two times a day (BID) | ORAL | Status: DC
  Administered 2022-04-16 – 2022-04-20 (×9): 5 mg via ORAL
  Filled 2022-04-16 (×9): qty 1

## 2022-04-16 MED ORDER — SODIUM CHLORIDE 0.9 % IV BOLUS
500.0000 mL | Freq: Once | INTRAVENOUS | Status: AC
  Administered 2022-04-16: 500 mL via INTRAVENOUS

## 2022-04-16 MED ORDER — SODIUM CHLORIDE 0.9 % IV SOLN: INTRAVENOUS | Status: AC

## 2022-04-16 MED ORDER — PROMETHAZINE HCL 25 MG PO TABS
25.0000 mg | ORAL_TABLET | Freq: Once | ORAL | Status: AC
  Administered 2022-04-16: 25 mg via ORAL
  Filled 2022-04-16: qty 1

## 2022-04-16 MED ORDER — PROCHLORPERAZINE EDISYLATE 10 MG/2ML IJ SOLN
5.0000 mg | INTRAMUSCULAR | Status: DC | PRN
  Administered 2022-04-17 – 2022-04-19 (×3): 5 mg via INTRAVENOUS
  Filled 2022-04-16 (×3): qty 2

## 2022-04-16 MED ORDER — BISACODYL 5 MG PO TBEC
5.0000 mg | DELAYED_RELEASE_TABLET | Freq: Every day | ORAL | Status: DC | PRN
  Administered 2022-04-17 – 2022-04-18 (×2): 5 mg via ORAL
  Filled 2022-04-16 (×3): qty 1

## 2022-04-16 MED ORDER — TIZANIDINE HCL 2 MG PO TABS
2.0000 mg | ORAL_TABLET | Freq: Four times a day (QID) | ORAL | Status: DC | PRN
  Administered 2022-04-18 – 2022-04-20 (×4): 2 mg via ORAL
  Filled 2022-04-16 (×4): qty 1

## 2022-04-16 NOTE — Assessment & Plan Note (Addendum)
Recent Labs  ?  08/27/21 ?1141 10/05/21 ?1820 10/31/21 ?9906 12/29/21 ?1235 01/12/22 ?1144 04/04/22 ?1613 04/11/22 ?1232 04/15/22 ?2250 04/16/22 ?0850 04/17/22 ?0128  ?BUN '17 16 20 17 21 19 18 17 18 19  '$ ?CREATININE 1.35* 1.65* 1.36* 1.61* 1.34* 1.26* 1.02 1.42* 1.44* 1.38*  ?AKI ruled out.  Creatinine seems to be at baseline. ?-Continue monitoring ? ?

## 2022-04-16 NOTE — Evaluation (Signed)
Occupational Therapy Evaluation ?Patient Details ?Name: Todd Salazar ?MRN: [id number] ?DOB: August 16, 1936 ?Today's Date: 04/16/2022 ? ? ?History of Present Illness Pt is a 86 y.o. M who presents 04/15/2022 who returns to ED for third time with right hip pain. MRI right hip showing no large hip joint effusion, osteolysis, or pseudotumor. CTA showing no acute findings in abdomen or pelvis. 4/24 X-ray showing no acute findings. Significant PMH: atrial fibrillation, bilateral THA, laminectomy, prostate surgery.  ? ?Clinical Impression ?  ?PTA, pt was living alone and was independent. Pt currently requiring Max A for LB ADLs  and Min-Mod A +2 for bed mobility. Pt with poor tolerance for sitting due to pain at R hip. Pt declining to stand due to significant pain at R hip. Pt would benefit from further acute OT to facilitate safe dc. Due to limited activity tolerance, recommend dc to SNF for further OT to optimize safety, independence with ADLs, and return to PLOF.  ?   ? ?Recommendations for follow up therapy are one component of a multi-disciplinary discharge planning process, led by the attending physician.  Recommendations may be updated based on patient status, additional functional criteria and insurance authorization.  ? ?Follow Up Recommendations ? Skilled nursing-short term rehab (<3 hours/day) (Pending pt progress and pain management)  ?  ?Assistance Recommended at Discharge Frequent or constant Supervision/Assistance  ?Patient can return home with the following Two people to help with walking and/or transfers;Two people to help with bathing/dressing/bathroom ? ?  ?Functional Status Assessment ? Patient has had a recent decline in their functional status and demonstrates the ability to make significant improvements in function in a reasonable and predictable amount of time.  ?Equipment Recommendations ? BSC/3in1  ?  ?Recommendations for Other Services PT consult ? ? ?  ?Precautions / Restrictions Precautions ?Precautions:  Fall ?Restrictions ?Other Position/Activity Restrictions: Pt unable to weight bear through RLE  ? ?  ? ?Mobility Bed Mobility ?Overal bed mobility: Needs Assistance ?Bed Mobility: Supine to Sit, Sit to Supine ?  ?  ?Supine to sit: Min assist, +2 for physical assistance ?Sit to supine: Mod assist, +2 for physical assistance ?  ?General bed mobility comments: Min A for managing RLE and elevating trunk. Mod A +2 for lowering trunk and managing BLEs ?  ? ?Transfers ?  ?  ?  ?  ?  ?  ?  ?  ?  ?General transfer comment: Unable to perform sit<>stand due to pain ?  ? ?  ?Balance Overall balance assessment: Needs assistance ?Sitting-balance support: Bilateral upper extremity supported, Feet supported ?Sitting balance-Leahy Scale: Poor ?Sitting balance - Comments: difficulty tolerating sitting and weight bearing through R hip ?  ?  ?  ?  ?  ?  ?  ?  ?  ?  ?  ?  ?  ?  ?  ?   ? ?ADL either performed or assessed with clinical judgement  ? ?ADL Overall ADL's : Needs assistance/impaired ?Eating/Feeding: Set up;Bed level ?  ?Grooming: Set up;Bed level;Supervision/safety ?  ?Upper Body Bathing: Min guard;Sitting ?  ?Lower Body Bathing: Maximal assistance;Bed level;Sitting/lateral leans ?  ?Upper Body Dressing : Min guard;Sitting ?  ?Lower Body Dressing: Maximal assistance;Sitting/lateral leans;Bed level ?  ?  ?  ?  ?  ?  ?  ?  ?General ADL Comments: Pt unable to perform standing due to significant pain  ? ? ? ?Vision Baseline Vision/History: 1 Wears glasses ?   ?   ?Perception   ?  ?Praxis   ?  ? ?  Pertinent Vitals/Pain Pain Assessment ?Pain Assessment: 0-10 ?Pain Score: 10-Worst pain ever ?Pain Location: R hip ?Pain Descriptors / Indicators: Sharp ?Pain Intervention(s): Monitored during session, Limited activity within patient's tolerance, Repositioned  ? ? ? ?Hand Dominance Right ?  ?Extremity/Trunk Assessment Upper Extremity Assessment ?Upper Extremity Assessment: Overall WFL for tasks assessed ?  ?Lower Extremity  Assessment ?Lower Extremity Assessment: Defer to PT evaluation ?  ?Cervical / Trunk Assessment ?Cervical / Trunk Assessment: Normal ?  ?Communication Communication ?Communication: No difficulties ?  ?Cognition Arousal/Alertness: Awake/alert ?Behavior During Therapy: [facility] for tasks assessed/performed ?Overall Cognitive Status: Within Functional Limits for tasks assessed ?  ?  ?  ?  ?  ?  ?  ?  ?  ?  ?  ?  ?  ?  ?  ?  ?  ?  ?  ?General Comments  son present ? ?  ?Exercises   ?  ?Shoulder Instructions    ? ? ?Home Living Family/patient expects to be discharged to:: Private residence ?Living Arrangements: Alone ?Available Help at Discharge: Family ?Type of Home: House ?Home Access: Level entry ?  ?  ?Home Layout: One level ?  ?  ?Bathroom Shower/Tub: Walk-in shower ?  ?Bathroom Toilet: Handicapped height ?  ?  ?Home Equipment: Wheelchair - Publishing copy (2 wheels);Cane - single point;BSC/3in1;Shower seat;Grab bars - tub/shower;Adaptive equipment ?Adaptive Equipment: Reacher;Sock aid;Long-handled shoe horn ?  ?  ? ?  ?Prior Functioning/Environment Prior Level of Function : Independent/Modified Independent ?  ?  ?  ?  ?  ?  ?  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  ? ?  ?  ?OT Problem List: Decreased strength;Decreased range of motion;Decreased activity tolerance;Impaired balance (sitting and/or standing);Decreased knowledge of use of DME or AE;Decreased knowledge of precautions ?  ?   ?OT Treatment/Interventions: Self-care/ADL training;Therapeutic exercise;Energy conservation;DME and/or AE instruction;Therapeutic activities;Patient/family education  ?  ?OT Goals(Current goals can be found in the care plan section) Acute Rehab OT Goals ?Patient Stated Goal: Stop pain ?OT Goal Formulation: With patient/family ?Time For Goal Achievement: 04/30/22 ?Potential to Achieve Goals: Good  ?OT Frequency: Min 2X/week ?  ? ?Co-evaluation   ?  ?  ?  ?  ? ?  ?AM-PAC OT "6 Clicks" Daily Activity      ?Outcome Measure Help from another person eating meals?: A Little ?Help from another person taking care of personal grooming?: A Little ?Help from another person toileting, which includes using toliet, bedpan, or urinal?: A Lot ?Help from another person bathing (including washing, rinsing, drying)?: A Lot ?Help from another person to put on and taking off regular upper body clothing?: A Little ?Help from another person to put on and taking off regular lower body clothing?: A Lot ?6 Click Score: 15 ?  ?End of Session Nurse Communication: Mobility status ? ?Activity Tolerance: Patient limited by pain ?Patient left: in bed;with call bell/phone within reach;with family/visitor present ? ?OT Visit Diagnosis: Unsteadiness on feet (R26.81);Other abnormalities of gait and mobility (R26.89);Muscle weakness (generalized) (M62.81)  ?              ?Time: 4315-4008 ?OT Time Calculation (min): 18 min ?Charges:  OT General Charges ?$OT Visit: 1 Visit ?OT Evaluation ?$OT Eval Moderate Complexity: 1 Mod ? ?Todd Salazar MSOT, OTR/L ?Acute Rehab ?Pager: [phone] ?Office: [phone] ? ?Todd Salazar M Kinslie Hove ?04/16/2022, 1:21 PM ?

## 2022-04-16 NOTE — H&P (Signed)
?History and Physical  ? ? ?Todd Salazar JHE:[id number] DOB: [birth year] DOA: 04/15/2022 ? ?PCP: [name], DO  ? ?Patient coming from: Home  ? ?Chief Complaint: Severe right hip pain, N/V  ? ?HPI: Todd Salazar is a pleasant 86 y.o. male with medical history significant for symptomatic bradycardia with pacer, atrial fibrillation on Eliquis, and CKD stage IIIa, now presenting to the emergency department with severe right hip pain, nausea, and vomiting.  Patient presented to the emergency department on April 24 with severe pain in his right lower back and hip without any inciting event that he could recall, was treated with oxycodone, Valium, and Toradol, and returned home.  He continued to have severe pain and also developed some nausea and vomiting which prompted his return to the emergency department on 04/11/2022 where he underwent MRI of the right hip and lumbar spine without any definite acute findings noted.  He has continued to have severe pain at home which has become more localized to the lateral right hip, worse with any movement but not with palpation, and without any fever, chills, or numbness.  He was taking oral morphine at home but has had worsening nausea and vomiting without abdominal pain or diarrhea. ? ?ED Course: Upon arrival to the ED, patient is found to be afebrile and saturating well on room air with stable blood pressure.  Chemistry panel notable for creatinine 1.42 and CBC features a stable microcytic anemia.  Patient was given IV fluids, fentanyl, and Phenergan in the emergency department but continues to have severe pain and nausea. ? ?Review of Systems:  ?All other systems reviewed and apart from HPI, are negative. ? ?Past Medical History:  ?Diagnosis Date  ? Arthritis   ? Atrial fibrillation ([place])   ? [facility])   ? prostate  ? Dysrhythmia   ? afib  ? History of kidney stones   ? ? ?Past Surgical History:  ?Procedure Laterality Date  ? ATRIAL FIBRILLATION ABLATION N/A 10/29/2021  ?  Procedure: ATRIAL FIBRILLATION ABLATION;  Surgeon: Constance Haw, MD;  Location: [facility];  Service: Cardiovascular;  Laterality: N/A;  ? Bone spur    ? CHOLECYSTECTOMY    ? HERNIA REPAIR    ? JOINT REPLACEMENT    ? kidney stones    ? LAMINECTOMY WITH POSTERIOR LATERAL ARTHRODESIS LEVEL 1 Right 11/25/2019  ? Procedure: Laminectomy and Foraminotomy  Lumbar Four-Lumbar Five - right, with instrumented fusion;  Surgeon: Eustace Moore, MD;  Location: [employer];  Service: Neurosurgery;  Laterality: Right;  Laminectomy and Foraminotomy  Lumbar Four-Lumbar Five - right, with instrumented fusion  ? PROSTATE SURGERY    ? ? ?Social History:  ? reports that he quit smoking about 25 years ago. His smoking use included cigarettes. He has never used smokeless tobacco. He reports that he does not drink alcohol and does not use drugs. ? ?Allergies  ?Allergen Reactions  ? Ciprofloxacin Anaphylaxis  ?  Dizziness ?  ? Tramadol Swelling  ?  Laryngeal edema  ? Duloxetine Other (See Comments)  ?  delusions  ? Hydrocodone-Acetaminophen Nausea And Vomiting  ? Benadryl [Diphenhydramine] Other (See Comments)  ?  Panic attack   ? Nisoldipine Other (See Comments)  ?  Pt unsure of reaction   ? Methocarbamol Nausea Only  ? Ondansetron Hcl Nausea Only  ? Sulfa Antibiotics Rash  ? ? ?Family History  ?Problem Relation Age of Onset  ? Hypertension Father   ? CAD Neg Hx   ?  Heart failure Neg Hx   ? Stroke Neg Hx   ? ? ? ?Prior to Admission medications   ?Medication Sig Start Date End Date Taking? Authorizing Provider  ?apixaban (ELIQUIS) 5 MG TABS tablet Take 1 tablet (5 mg total) by mouth 2 (two) times daily. 03/18/22  Yes Camnitz, Ocie Doyne, MD  ?gabapentin (NEURONTIN) 100 MG capsule Take 100 mg by mouth at bedtime as needed (for sleep). 01/24/22  Yes [provider]  ?morphine (MSIR) 15 MG tablet Take 1 tablet (15 mg total) by mouth every 6 (six) hours as needed for severe pain. 04/12/22  Yes Horton, Alvin Critchley, DO  ?ondansetron  (ZOFRAN) 8 MG tablet Take 4-8 mg by mouth every 6 (six) hours as needed for nausea or vomiting. 04/13/22  Yes [provider]  ?diclofenac Sodium (VOLTAREN) 1 % GEL Apply 4 g topically 4 (four) times daily. ?Patient not taking: Reported on 04/11/2022 04/04/22   [name], DO  ?methylPREDNISolone (MEDROL DOSEPAK) 4 MG TBPK tablet Day 1: '8mg'$  before breakfast, 4 mg after lunch, 4 mg after supper, and 8 mg at bedtime Day 2: 4 mg before breakfast, 4 mg after lunch, 4 mg  after supper, and 8 mg  at bedtime Day 3:  4 mg  before breakfast, 4 mg  after lunch, 4 mg after supper, and 4 mg  at bedtime Day 4: 4 mg  before breakfast, 4 mg  after lunch, and 4 mg at bedtime Day 5: 4 mg  before breakfast and 4 mg at bedtime Day 6: 4 mg  before breakfast ?Patient not taking: Reported on 04/11/2022 04/04/22   [name], DO  ?nitroGLYCERIN (NITROSTAT) 0.4 MG SL tablet Place 0.4 mg under the tongue every 5 (five) minutes as needed for chest pain.  ?Patient not taking: Reported on 04/11/2022 07/25/19   [provider]  ? ? ?Physical Exam: ?Vitals:  ? 04/16/22 0408 04/16/22 0445 04/16/22 0453 04/16/22 0515  ?BP:  132/65  121/61  ?Pulse: 67 68  65  ?Resp: (!) '23 20  19  '$ ?Temp:      ?TempSrc:      ?SpO2: 100% 99% 100% 96%  ?Weight:      ?Height:      ? ? ?Constitutional: NAD, calm  ?Eyes: PERTLA, lids and conjunctivae normal ?ENMT: Mucous membranes are moist. Posterior pharynx clear of any exudate or lesions.   ?Neck: supple, no masses  ?Respiratory: no wheezing, no crackles. No accessory muscle use.  ?Cardiovascular: S1 & S2 heard, regular rate and rhythm. No extremity edema.  ?Abdomen: No distension, no tenderness, soft. Bowel sounds active.  ?Musculoskeletal: no clubbing / cyanosis. No joint deformity upper and lower extremities.   ?Skin: no significant rashes, lesions, ulcers. Warm, dry, well-perfused. ?Neurologic: CN 2-12 grossly intact. Sensation to light touch intact. Proximal RLE strength testing limited by pain, strength  5/5 in distal RLE and throughout other limbs. Alert and oriented.  ?Psychiatric: Pleasant. Cooperative.  ? ? ?Labs and Imaging on Admission: I have personally reviewed following labs and imaging studies ? ?CBC: ?Recent Labs  ?Lab 04/11/22 ?1232 04/15/22 ?2250  ?WBC 5.6 5.0  ?NEUTROABS 3.4  --   ?HGB 8.8* 10.4*  ?HCT 26.7* 31.1*  ?MCV 105.1* 102.3*  ?PLT 244 278  ? ?Basic Metabolic Panel: ?Recent Labs  ?Lab 04/11/22 ?1232 04/15/22 ?2250  ?NA 142 141  ?K 3.7 4.7  ?CL 119* 110  ?CO2 23 24  ?GLUCOSE 89 99  ?BUN 18 17  ?CREATININE 1.02 1.42*  ?  CALCIUM 7.0* 9.2  ? ?GFR: ?Estimated Creatinine Clearance: 46.3 mL/min (A) (by C-G formula based on SCr of 1.42 mg/dL (H)). ?Liver Function Tests: ?Recent Labs  ?Lab 04/11/22 ?1232  ?AST 14*  ?ALT 15  ?ALKPHOS 56  ?BILITOT 1.2  ?PROT 5.4*  ?ALBUMIN 3.4*  ? ?Recent Labs  ?Lab 04/11/22 ?1232  ?LIPASE 28  ? ?No results for input(s): AMMONIA in the last 168 hours. ?Coagulation Profile: ?No results for input(s): INR, PROTIME in the last 168 hours. ?Cardiac Enzymes: ?No results for input(s): CKTOTAL, CKMB, CKMBINDEX, TROPONINI in the last 168 hours. ?BNP (last 3 results) ?Recent Labs  ?  01/12/22 ?1144  ?PROBNP 261  ? ?HbA1C: ?No results for input(s): HGBA1C in the last 72 hours. ?CBG: ?No results for input(s): GLUCAP in the last 168 hours. ?Lipid Profile: ?No results for input(s): CHOL, HDL, LDLCALC, TRIG, CHOLHDL, LDLDIRECT in the last 72 hours. ?Thyroid Function Tests: ?No results for input(s): TSH, T4TOTAL, FREET4, T3FREE, THYROIDAB in the last 72 hours. ?Anemia Panel: ?No results for input(s): VITAMINB12, FOLATE, FERRITIN, TIBC, IRON, RETICCTPCT in the last 72 hours. ?Urine analysis: ?   ?Component Value Date/Time  ? COLORURINE YELLOW 04/15/2022 2250  ? APPEARANCEUR CLEAR 04/15/2022 2250  ? LABSPEC 1.016 04/15/2022 2250  ? PHURINE 6.0 04/15/2022 2250  ? GLUCOSEU NEGATIVE 04/15/2022 2250  ? HGBUR NEGATIVE 04/15/2022 2250  ? [place] NEGATIVE 04/15/2022 2250  ? [place] NEGATIVE  04/15/2022 2250  ? PROTEINUR NEGATIVE 04/15/2022 2250  ? UROBILINOGEN 1.0 12/11/2011 0005  ? NITRITE NEGATIVE 04/15/2022 2250  ? LEUKOCYTESUR NEGATIVE 04/15/2022 2250  ? ?Sepsis Labs: ?'@LABRCNTIP'$ (procalcito

## 2022-04-16 NOTE — Assessment & Plan Note (Addendum)
Seems to have resolved.  Basic labs and recent CT A/P without significant finding.  Abdominal exam benign.  Could be related to opiate.  He seems he was started on morphine about 3 days prior to admission. ?-Continue antiemetics and IV fluid ?-Bowel regimen ?

## 2022-04-16 NOTE — ED Provider Notes (Signed)
?[facility] ?Provider Note ? ? ?CSN: [id number] ?Arrival date & time: 04/15/22  2151 ? ?  ? ?History ? ?Chief Complaint  ?Patient presents with  ? Back Pain  ? ? ?Todd Salazar is a 86 y.o. male. ? ?The history is provided by the patient, a relative and medical records.  ?Back Pain ?Todd Salazar is a 86 y.o. male who presents to the Emergency Department complaining of back pain.  He presents to the ED accompanied by sons for severe back pain.  He has a history of chronic back pain, A-fib with pacemaker placement on chronic anticoagulation.  He reports pain in the right hip for about 1 week.  Pain is better laying down, worse sitting up.  He is completely unable to ambulate secondary to pain.  He has been evaluated twice in the emergency department in the last week for this pain.  He has tried Percocet, which resulted in vomiting.  He also tried morphine plus an antiemetic and also had vomiting.  His last dose of morphine was on Thursday.  He presents accompanied by his son today because he has been unable to do anything but lay in bed for the last 24 hours and he has experienced persistent nausea and vomiting.  He typically ambulates independently without assistive devices and lives alone.  He did recently take a Medrol Dosepak on April 24 without improvement in symptoms.  He did have a spinal stimulator implant placed a few weeks ago with very good results.  He is followed by neurosurgery.  He did have an MRI performed on May 2 of the lumbar spine and hip that was negative for acute abnormality.  Per family neurosurgery recommends that he has an MRI of the thoracic spine to rule out additional pathology. ? ?  ? ?Home Medications ?Prior to Admission medications   ?Medication Sig Start Date End Date Taking? Authorizing Provider  ?apixaban (ELIQUIS) 5 MG TABS tablet Take 1 tablet (5 mg total) by mouth 2 (two) times daily. 03/18/22  Yes Camnitz, Ocie Doyne, MD  ?gabapentin (NEURONTIN)  100 MG capsule Take 100 mg by mouth at bedtime as needed (for sleep). 01/24/22  Yes [provider]  ?morphine (MSIR) 15 MG tablet Take 1 tablet (15 mg total) by mouth every 6 (six) hours as needed for severe pain. 04/12/22  Yes Horton, Alvin Critchley, DO  ?ondansetron (ZOFRAN) 8 MG tablet Take 4-8 mg by mouth every 6 (six) hours as needed for nausea or vomiting. 04/13/22  Yes [provider]  ?diclofenac Sodium (VOLTAREN) 1 % GEL Apply 4 g topically 4 (four) times daily. ?Patient not taking: Reported on 04/11/2022 04/04/22   [name], DO  ?methylPREDNISolone (MEDROL DOSEPAK) 4 MG TBPK tablet Day 1: '8mg'$  before breakfast, 4 mg after lunch, 4 mg after supper, and 8 mg at bedtime Day 2: 4 mg before breakfast, 4 mg after lunch, 4 mg  after supper, and 8 mg  at bedtime Day 3:  4 mg  before breakfast, 4 mg  after lunch, 4 mg after supper, and 4 mg  at bedtime Day 4: 4 mg  before breakfast, 4 mg  after lunch, and 4 mg at bedtime Day 5: 4 mg  before breakfast and 4 mg at bedtime Day 6: 4 mg  before breakfast ?Patient not taking: Reported on 04/11/2022 04/04/22   [name], DO  ?nitroGLYCERIN (NITROSTAT) 0.4 MG SL tablet Place 0.4 mg under the tongue every 5 (five) minutes as needed  for chest pain.  ?Patient not taking: Reported on 04/11/2022 07/25/19   [provider]  ?   ? ?Allergies    ?Ciprofloxacin, Tramadol, Duloxetine, Hydrocodone-acetaminophen, Benadryl [diphenhydramine], Nisoldipine, Methocarbamol, Ondansetron hcl, and Sulfa antibiotics   ? ?Review of Systems   ?Review of Systems  ?Musculoskeletal:  Positive for back pain.  ?All other systems reviewed and are negative. ? ?Physical Exam ?Updated Vital Signs ?BP 121/61   Pulse 65   Temp 98.3 ?F (36.8 ?C) (Oral)   Resp 19   Ht '6\' 3"'$  (1.905 m)   Wt 86 kg   SpO2 96%   BMI 23.70 kg/m?  ?Physical Exam ?Vitals and nursing note reviewed.  ?Constitutional:   ?   Appearance: He is well-developed.  ?HENT:  ?   Head: Normocephalic and atraumatic.   ?Cardiovascular:  ?   Rate and Rhythm: Normal rate and regular rhythm.  ?   Heart sounds: No murmur heard. ?Pulmonary:  ?   Effort: Pulmonary effort is normal. No respiratory distress.  ?   Breath sounds: Normal breath sounds.  ?Abdominal:  ?   Palpations: Abdomen is soft.  ?   Tenderness: There is no abdominal tenderness. There is no guarding or rebound.  ?Musculoskeletal:     ?   General: No tenderness.  ?   Comments: 2+ DP pulses bilaterally.  There is no significant tenderness to palpation over the right hip.  ?Skin: ?   General: Skin is warm and dry.  ?Neurological:  ?   Mental Status: He is alert and oriented to person, place, and time.  ?   Comments: 5 out of 5 strength in bilateral upper extremities, left lower extremity.  There is 5 out of 5 strength in the distal right lower extremity.  Unable to test proximal strength in the right lower extremity secondary to pain.  Patient is unable to actively or passively range the right hip.  ?Psychiatric:     ?   Behavior: Behavior normal.  ? ? ?ED Results / Procedures / Treatments   ?Labs ?(all labs ordered are listed, but only abnormal results are displayed) ?Labs Reviewed  ?BASIC METABOLIC PANEL - Abnormal; Notable for the following components:  ?    Result Value  ? Creatinine, Ser 1.42 (*)   ? GFR, Estimated 49 (*)   ? All other components within normal limits  ?CBC - Abnormal; Notable for the following components:  ? RBC 3.04 (*)   ? Hemoglobin 10.4 (*)   ? HCT 31.1 (*)   ? MCV 102.3 (*)   ? MCH 34.2 (*)   ? RDW 22.9 (*)   ? nRBC 0.4 (*)   ? All other components within normal limits  ?URINALYSIS, ROUTINE W REFLEX MICROSCOPIC  ?CBC  ?RENAL FUNCTION PANEL  ?MAGNESIUM  ?VITAMIN B12  ?FOLATE  ?IRON AND TIBC  ?FERRITIN  ?RETICULOCYTES  ? ? ?EKG ?None ? ?Radiology ?No results found. ? ?Procedures ?Procedures  ? ? ?Medications Ordered in ED ?Medications  ?HYDROmorphone (DILAUDID) injection 0.5 mg (has no administration in time range)  ?prochlorperazine (COMPAZINE)  injection 5 mg (has no administration in time range)  ?apixaban (ELIQUIS) tablet 5 mg (has no administration in time range)  ?gabapentin (NEURONTIN) capsule 100 mg (has no administration in time range)  ?0.9 %  sodium chloride infusion ( Intravenous New Bag/Given 04/16/22 0701)  ?acetaminophen (TYLENOL) tablet 650 mg (650 mg Oral Given 04/16/22 0657)  ?polyethylene glycol (MIRALAX / GLYCOLAX) packet 17 g (has no administration in  time range)  ?bisacodyl (DULCOLAX) EC tablet 5 mg (has no administration in time range)  ?lidocaine (LIDODERM) 5 % 1 patch (1 patch Transdermal Patch Applied 04/16/22 0655)  ?tiZANidine (ZANAFLEX) tablet 2 mg (has no administration in time range)  ?fentaNYL (SUBLIMAZE) injection 50 mcg (50 mcg Intravenous Given 04/16/22 0506)  ?promethazine (PHENERGAN) tablet 25 mg (25 mg Oral Given 04/16/22 0509)  ?sodium chloride 0.9 % bolus 500 mL (0 mLs Intravenous Stopped 04/16/22 0615)  ? ? ?ED Course/ Medical Decision Making/ A&P ?  ?                        ?Medical Decision Making ?Amount and/or Complexity of Data Reviewed ?Labs: ordered. ? ?Risk ?Prescription drug management. ?Decision regarding hospitalization. ? ? ?Patient with history of A-fib, pacemaker placement,, chronic anticoagulation, chronic back pain here for evaluation of severe right hip pain, vomiting.  He does not have any tenderness on palpation or skin changes on examination.  He is well perfused distally.  He was treated with pain medication and antiemetic in the emergency department.  CBC with stable anemia, BMP with stable renal insufficiency.  Given patient's persistent nausea, vomiting as well as uncontrolled pain recommend admission so he can have further work-up of this ongoing pain.  He may require MRI.  Medicine consulted for admission for ongoing care. ? ? ? ? ? ? ? ?Final Clinical Impression(s) / ED Diagnoses ?Final diagnoses:  ?None  ? ? ?Rx / DC Orders ?ED Discharge Orders   ? ? None  ? ?  ? ? ?  ?Quintella Reichert, MD ?04/16/22  5427 ? ?

## 2022-04-16 NOTE — Evaluation (Addendum)
Physical Therapy Evaluation ?Patient Details ?Name: Todd Salazar ?MRN: [id number] ?DOB: [birth year] ?Today's Date: 04/16/2022 ? ?History of Present Illness ? Pt is a 86 y.o. M who presents 04/15/2022 who returns to ED for third time with right hip pain. MRI right hip showing no large hip joint effusion, osteolysis, or pseudotumor. CTA showing no acute findings in abdomen or pelvis. 4/24 X-ray showing no acute findings. Significant PMH: atrial fibrillation, bilateral THA, laminectomy, prostate surgery.  ?Clinical Impression ? 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 Pt currently with 10/10 right iliopsoas pain, with compressive activities such as sitting, standing or weightbearing. Pt reports relieving factors includes lying supine. Could not reproduce pain with passive range of motion and no tenderness with palpation appreciated. Pt with noted 2/5 hip flexor strength, which is a new deficit. Pt requiring two person min-mod assist for bed mobility and is currently unable to currently stand. Relayed assessment to MD. Will continue to progress mobility as tolerated.  ?   ? ?Recommendations for follow up therapy are one component of a multi-disciplinary discharge planning process, led by the attending physician.  Recommendations may be updated based on patient status, additional functional criteria and insurance authorization. ? ?Follow Up Recommendations Skilled nursing-short term rehab (<3 hours/day) (will continue to reassess) ? ?  ?Assistance Recommended at Discharge Frequent or constant Supervision/Assistance  ?Patient can return home with the following ? Two people to help with walking and/or transfers;A lot of help with bathing/dressing/bathroom ? ?  ?Equipment Recommendations BSC/3in1  ?Recommendations for Other Services ?    ?  ?Functional Status Assessment Patient has had a recent decline in their functional status and demonstrates the ability to make significant  improvements in function in a reasonable and predictable amount of time.  ? ?  ?Precautions / Restrictions Precautions ?Precautions: Fall ?Restrictions ?Weight Bearing Restrictions: No ?Other Position/Activity Restrictions: Pt unable to weight bear through RLE  ? ?  ? ?Mobility ? Bed Mobility ?Overal bed mobility: Needs Assistance ?Bed Mobility: Supine to Sit, Sit to Supine ?  ?  ?Supine to sit: Min assist, +2 for physical assistance ?Sit to supine: Mod assist, +2 for physical assistance ?  ?General bed mobility comments: Min A for managing RLE and elevating trunk. Mod A +2 for lowering trunk and managing BLEs ?  ? ?Transfers ?  ?  ?  ?  ?  ?  ?  ?  ?  ?General transfer comment: Unable to perform sit<>stand due to pain ?  ? ?Ambulation/Gait ?  ?  ?  ?  ?  ?  ?  ?General Gait Details: unable ? ?Stairs ?  ?  ?  ?  ?  ? ?Wheelchair Mobility ?  ? ?Modified Rankin (Stroke Patients Only) ?  ? ?  ? ?Balance Overall balance assessment: Needs assistance ?Sitting-balance support: Bilateral upper extremity supported, Feet supported ?Sitting balance-Leahy Scale: Poor ?Sitting balance - Comments: difficulty tolerating sitting and weight bearing through R hip ?  ?  ?  ?  ?  ?  ?  ?  ?  ?  ?  ?  ?  ?  ?  ?   ? ? ? ?Pertinent Vitals/Pain Pain Assessment ?Pain Assessment: 0-10 ?Pain Score: 10-Worst pain ever ?Pain Location: R hip ?Pain Descriptors / Indicators: Sharp ?Pain Intervention(s): Limited activity within patient's tolerance, Monitored during session  ? ? ?Home Living Family/patient expects to be discharged to:: Private residence ?Living Arrangements: Alone ?Available Help  at Discharge: Family ?Type of Home: House ?Home Access: Level entry ?  ?  ?  ?Home Layout: One level ?Home Equipment: Wheelchair - Publishing copy (2 wheels);Cane - single point;Shower seat;Grab bars - tub/shower;Adaptive equipment ?   ?  ?Prior Function Prior Level of Function : Independent/Modified Independent ?  ?  ?  ?  ?  ?  ?  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  ? ? ?Hand Dominance  ? Dominant Hand: Right ? ?  ?Extremity/Trunk Assessment  ? Upper Extremity Assessment ?Upper Extremity Assessment: Overall WFL for tasks assessed ?  ? ?Lower Extremity Assessment ?Lower Extremity Assessment: RLE deficits/detail ?RLE Deficits / Details: No tenderness to palpation. Hip flexion 2/5, knee extension 4/5 within limited range, ankle dorsiflexion 5/5 ?  ? ?Cervical / Trunk Assessment ?Cervical / Trunk Assessment: Normal  ?Communication  ? Communication: No difficulties  ?Cognition Arousal/Alertness: Awake/alert ?Behavior During Therapy: [facility] for tasks assessed/performed ?Overall Cognitive Status: Within Functional Limits for tasks assessed ?  ?  ?  ?  ?  ?  ?  ?  ?  ?  ?  ?  ?  ?  ?  ?  ?  ?  ?  ? ?  ?General Comments General comments (skin integrity, edema, etc.): son present ? ?  ?Exercises    ? ?Assessment/Plan  ?  ?PT Assessment Patient needs continued PT services  ?PT Problem List Decreased strength;Decreased range of motion;Decreased activity tolerance;Decreased balance;Decreased mobility;Pain ? ?   ?  ?PT Treatment Interventions Gait training;DME instruction;Functional mobility training;Therapeutic activities;Therapeutic exercise;Balance training;Patient/family education   ? ?PT Goals (Current goals can be found in the Care Plan section)  ?Acute Rehab PT Goals ?Patient Stated Goal: find out source of pain ?PT Goal Formulation: With patient/family ?Time For Goal Achievement: 04/30/22 ?Potential to Achieve Goals: Good ? ?  ?Frequency Min 3X/week ?  ? ? ?Co-evaluation PT/OT/SLP Co-Evaluation/Treatment: Yes ?Reason for Co-Treatment: For patient/therapist safety;To address functional/ADL transfers ?PT goals addressed during session: Mobility/safety with mobility ?  ?  ? ? ?  ?AM-PAC PT "6 Clicks" Mobility  ?Outcome Measure Help needed turning from your back to your side while in a flat bed without using bedrails?: A Little ?Help  needed moving from lying on your back to sitting on the side of a flat bed without using bedrails?: A Little ?Help needed moving to and from a bed to a chair (including a wheelchair)?: Total ?Help needed standing up from a chair using your arms (e.g., wheelchair or bedside chair)?: Total ?Help needed to walk in hospital room?: Total ?Help needed climbing 3-5 steps with a railing? : Total ?6 Click Score: 10 ? ?  ?End of Session   ?Activity Tolerance: Patient limited by pain ?Patient left: in bed;with call bell/phone within reach;with family/visitor present ?Nurse Communication: Mobility status ?PT Visit Diagnosis: Other abnormalities of gait and mobility (R26.89);Pain ?Pain - Right/Left: Right ?Pain - part of body: Hip ?  ? ?Time: 1248-1310 ?PT Time Calculation (min) (ACUTE ONLY): 22 min ? ? ?Charges:   PT Evaluation ?$PT Eval Moderate Complexity: 1 Mod ?  ?  ?   ? ? ?Wyona Almas, PT, DPT ?Acute Rehabilitation Services ?Pager [phone] ?Office [phone] ? ? ?[name] ?04/16/2022, 3:19 PM ? ?

## 2022-04-16 NOTE — Consult Note (Addendum)
Reason for Consult: Right hip pain ?Referring Physician: Dr. Cyndia Skeeters ? ?Todd Salazar is an 86 y.o. male.  ?HPI: 86 year old male with past medical history significant for bilateral hip replacements was in his usual state of good health until about 2 weeks ago.  He complains of sudden onset right hip pain.  He denies any falls or other injury.  He denies any pain down in his groin.  He points to the lateral aspect of his hip adjacent to the gluteus maximus and medius just distal from the iliac crest where he hurts the worst.  He is unable to sit up or stand up.  He is comfortable when lying down flat.  He has a history of lumbar spine fusion and recently underwent a trial of a spinal cord stimulator with Dr. Ronnald Ramp at [facility] neurosurgery.  He is scheduled to have a stimulator placed later this month.  He also has an MRI scheduled of his thoracic spine later this month.  He had his hip replaced he thinks about 4 or 5 years ago.  His son is with him and reports that surgery was done in [facility].  Both of his hips were replaced by surgeons no longer in practice.  He denies any fever or chills.  He has had some nausea and vomiting due to opioid pain medicine.  He is been to the emergency room 3 times in the last couple of weeks.  He had an MRI of his hip as well as plain films of his hip.  He is not a smoker.  He lives alone.  He is not diabetic.  He denies any radiation of pain down into his thigh or leg.  He denies any numbness or tingling in his right lower extremity. ? ?Past Medical History:  ?Diagnosis Date  ? Arthritis   ? Atrial fibrillation ([place])   ? Cancer [facility])   ? prostate  ? Dysrhythmia   ? afib  ? History of kidney stones   ? ? ?Past Surgical History:  ?Procedure Laterality Date  ? ATRIAL FIBRILLATION ABLATION N/A 10/29/2021  ? Procedure: ATRIAL FIBRILLATION ABLATION;  Surgeon: Constance Haw, MD;  Location: [place] [facility];  Service: Cardiovascular;  Laterality: N/A;  ? Bone spur    ?  CHOLECYSTECTOMY    ? HERNIA REPAIR    ? JOINT REPLACEMENT    ? kidney stones    ? LAMINECTOMY WITH POSTERIOR LATERAL ARTHRODESIS LEVEL 1 Right 11/25/2019  ? Procedure: Laminectomy and Foraminotomy  Lumbar Four-Lumbar Five - right, with instrumented fusion;  Surgeon: Eustace Moore, MD;  Location: [place];  Service: Neurosurgery;  Laterality: Right;  Laminectomy and Foraminotomy  Lumbar Four-Lumbar Five - right, with instrumented fusion  ? PROSTATE SURGERY    ? ? ?Family History  ?Problem Relation Age of Onset  ? Hypertension Father   ? CAD Neg Hx   ? Heart failure Neg Hx   ? Stroke Neg Hx   ? ? ?Social History:  reports that he quit smoking about 25 years ago. His smoking use included cigarettes. He has never used smokeless tobacco. He reports that he does not drink alcohol and does not use drugs. ? ?Allergies:  ?Allergies  ?Allergen Reactions  ? Ciprofloxacin Anaphylaxis  ?  Dizziness ?  ? Tramadol Swelling  ?  Laryngeal edema  ? Duloxetine Other (See Comments)  ?  delusions  ? Hydrocodone-Acetaminophen Nausea And Vomiting  ? Benadryl [Diphenhydramine] Other (See Comments)  ?  Panic attack   ?  Nisoldipine Other (See Comments)  ?  Pt unsure of reaction   ? Methocarbamol Nausea Only  ? Ondansetron Hcl Nausea Only  ? Sulfa Antibiotics Rash  ? ? ?Medications: I have reviewed the patient's current medications. ? ?Results for orders placed or performed during the hospital encounter of 04/15/22 (from the past 48 hour(s))  ?Basic metabolic panel     Status: Abnormal  ? Collection Time: 04/15/22 10:50 PM  ?Result Value Ref Range  ? Sodium 141 135 - 145 mmol/L  ? Potassium 4.7 3.5 - 5.1 mmol/L  ? Chloride 110 98 - 111 mmol/L  ? CO2 24 22 - 32 mmol/L  ? Glucose, Bld 99 70 - 99 mg/dL  ?  Comment: Glucose reference range applies only to samples taken after fasting for at least 8 hours.  ? BUN 17 8 - 23 mg/dL  ? Creatinine, Ser 1.42 (H) 0.61 - 1.24 mg/dL  ? Calcium 9.2 8.9 - 10.3 mg/dL  ? GFR, Estimated 49 (L) >60 mL/min  ?   Comment: (NOTE) ?Calculated using the CKD-EPI Creatinine Equation (2021) ?  ? Anion gap 7 5 - 15  ?  Comment: Performed at [facility], [place] [address], [place], [place] [address]  ?CBC     Status: Abnormal  ? Collection Time: 04/15/22 10:50 PM  ?Result Value Ref Range  ? WBC 5.0 4.0 - 10.5 K/uL  ? RBC 3.04 (L) 4.22 - 5.81 MIL/uL  ? Hemoglobin 10.4 (L) 13.0 - 17.0 g/dL  ? HCT 31.1 (L) 39.0 - 52.0 %  ? MCV 102.3 (H) 80.0 - 100.0 fL  ? MCH 34.2 (H) 26.0 - 34.0 pg  ? MCHC 33.4 30.0 - 36.0 g/dL  ? RDW 22.9 (H) 11.5 - 15.5 %  ? Platelets 278 150 - 400 K/uL  ? nRBC 0.4 (H) 0.0 - 0.2 %  ?  Comment: Performed at [facility], [place] [address], [place], [place] [address]  ?Urinalysis, Routine w reflex microscopic Urine, Clean Catch     Status: None  ? Collection Time: 04/15/22 10:50 PM  ?Result Value Ref Range  ? Color, Urine YELLOW YELLOW  ? APPearance CLEAR CLEAR  ? Specific Gravity, Urine 1.016 1.005 - 1.030  ? pH 6.0 5.0 - 8.0  ? Glucose, UA NEGATIVE NEGATIVE mg/dL  ? Hgb urine dipstick NEGATIVE NEGATIVE  ? Bilirubin Urine NEGATIVE NEGATIVE  ? Ketones, ur NEGATIVE NEGATIVE mg/dL  ? Protein, ur NEGATIVE NEGATIVE mg/dL  ? Nitrite NEGATIVE NEGATIVE  ? Leukocytes,Ua NEGATIVE NEGATIVE  ?  Comment: Performed at [facility], [place] [address], [place], [place] [address]  ?CBC     Status: Abnormal  ? Collection Time: 04/16/22  8:50 AM  ?Result Value Ref Range  ? WBC 5.6 4.0 - 10.5 K/uL  ? RBC 2.95 (L) 4.22 - 5.81 MIL/uL  ? Hemoglobin 10.3 (L) 13.0 - 17.0 g/dL  ? HCT 30.7 (L) 39.0 - 52.0 %  ? MCV 104.1 (H) 80.0 - 100.0 fL  ? MCH 34.9 (H) 26.0 - 34.0 pg  ? MCHC 33.6 30.0 - 36.0 g/dL  ? RDW 23.3 (H) 11.5 - 15.5 %  ? Platelets 249 150 - 400 K/uL  ? nRBC 0.4 (H) 0.0 - 0.2 %  ?  Comment: Performed at [facility], [place] [address], [place],  [address]  ?Renal function panel     Status: Abnormal  ? Collection Time: 04/16/22  8:50 AM  ?Result Value Ref Range  ?  Sodium 139 135 - 145 mmol/L  ? Potassium 4.2 3.5 -  5.1 mmol/L  ? Chloride 109 98 - 111 mmol/L  ? CO2 25 22 - 32 mmol/L  ? Glucose, Bld 120 (H) 70 - 99 mg/dL  ?  Comment: Glucose reference range applies only to samples taken after fasting for at least 8 hours.  ? BUN 18 8 - 23 mg/dL  ? Creatinine, Ser 1.44 (H) 0.61 - 1.24 mg/dL  ? Calcium 8.9 8.9 - 10.3 mg/dL  ? Phosphorus 2.8 2.5 - 4.6 mg/dL  ? Albumin 4.1 3.5 - 5.0 g/dL  ? GFR, Estimated 48 (L) >60 mL/min  ?  Comment: (NOTE) ?Calculated using the CKD-EPI Creatinine Equation (2021) ?  ? Anion gap 5 5 - 15  ?  Comment: Performed at [facility], [place] [address] [place], [place] [address]  ?Magnesium     Status: None  ? Collection Time: 04/16/22  8:50 AM  ?Result Value Ref Range  ? Magnesium 1.9 1.7 - 2.4 mg/dL  ?  Comment: Performed at [facility], [place] [address], [place], [place] [address]  ?Vitamin B12     Status: None  ? Collection Time: 04/16/22  8:50 AM  ?Result Value Ref Range  ? Vitamin B-12 641 180 - 914 pg/mL  ?  Comment: (NOTE) ?This assay is not validated for testing neonatal or ?myeloproliferative syndrome specimens for Vitamin B12 levels. ?Performed at [facility], [place] [address], [place], [place] ?[address] ?  ?Folate     Status: None  ? Collection Time: 04/16/22  8:50 AM  ?Result Value Ref Range  ? Folate 13.8 >5.9 ng/mL  ?  Comment: Performed at [facility], [place] [address] [place], [place] [address]  ?Iron and TIBC     Status: Abnormal  ? Collection Time: 04/16/22  8:50 AM  ?Result Value Ref Range  ? Iron 69 45 - 182 ug/dL  ? TIBC 224 (L) 250 - 450 ug/dL  ? Saturation Ratios 31 17.9 - 39.5 %  ? UIBC 155 ug/dL  ?  Comment: Performed at [facility], [place] [address], [place], [place] [address]  ?Ferritin     Status: Abnormal  ? Collection Time: 04/16/22  8:50 AM  ?Result Value Ref Range  ? Ferritin 632 (H) 24 - 336 ng/mL  ?  Comment: Performed at [facility], [place] [address], [place], [place] [address]  ?Reticulocytes     Status: Abnormal  ? Collection Time:  04/16/22  8:50 AM  ?Result Value Ref Range  ? Retic Ct Pct 1.4 0.4 - 3.1 %  ? RBC. 2.94 (L) 4.22 - 5.81 MIL/uL  ? Retic Count, Absolute 40.0 19.0 - 186.0 K/uL  ? Immature Retic Fract 16.4 (H) 2.3 - 15.9 %  ?  Comment: Perfo

## 2022-04-16 NOTE — Progress Notes (Signed)
?PROGRESS NOTE ? ?Todd Salazar TDD:[id number] DOB: 1936-08-10  ? ?PCP: [name], DO ? ?Patient is from: Home.  Lives alone.  Recently started using walker due to right hip pain. ? ?DOA: 04/15/2022 LOS: 0 ? ?Chief complaints ?Chief Complaint  ?Patient presents with  ? Back Pain  ?  ? ?Brief Narrative / Interim history: ?86 year old M with PMH of bradycardia/PPM, A-fib on Eliquis, CKD-3A, right THA in 06/2017 at [facility] and prostate cancer returning to ED for the third time with right hip pain with associated nausea and vomiting, and admitted for the same.  Patient had basically negative work-up including basic labs, CT abdomen and pelvis and MRI of the right hip and lumbar spine on 5/1 when he was seen in ED. ? ?On arrival to ED, vitals stable.  Basic labs including BMP and CBC without significant finding.   ? ?Subjective: ?Seen and examined earlier this morning.  No major events overnight of this morning.  He continues to endorse significant right hip pain over anterior lateral groin area.  Pain is worse with movement.  Mostly sharp.  He denies numbness or tingling.  No radiation to his groin.  Continues to endorse nausea but no emesis.  He denies abdominal pain.  No trauma or injury. ? ?Objective: ?Vitals:  ? 04/16/22 0445 04/16/22 0453 04/16/22 0515 04/16/22 0800  ?BP: 132/65  121/61 129/65  ?Pulse: 68  65 71  ?Resp: '20  19 20  ' ?Temp:      ?TempSrc:      ?SpO2: 99% 100% 96% 99%  ?Weight:      ?Height:      ? ? ?Examination: ? ?GENERAL: No apparent distress.  Nontoxic. ?HEENT: MMM.  Vision and hearing grossly intact.  ?NECK: Supple.  No apparent JVD.  ?RESP:  No IWOB.  Fair aeration bilaterally. ?CVS:  RRR. Heart sounds normal.  ?ABD/GI/GU: BS+. Abd soft, NTND.  ?MSK/EXT:  Moves extremities. No apparent deformity. No edema.  Pain with right hip flexion and percussion of his right foot ?SKIN: no apparent skin lesion or wound ?NEURO: Awake, alert and oriented appropriately.  No apparent focal neuro deficit. ?PSYCH:  Calm. Normal affect.  ? ?Procedures:  ?None ? ?Microbiology summarized: ?None ? ?Assessment and Plan: ?Acute right hip pain ?Patient with history of right THA in 06/2017 at [facility].  MRI lumbar spine on 5/1 with a stable lumbar disc and facet degeneration and moderate multilevel neuroforaminal stenosis but no high-grade spinal stenosis.  MRI right hip showed previous bilateral THA but no joint effusion, osteolysis or pseudotumor.  CT abdomen and pelvis without significant finding.  Patient continues to endorse significant pain with movement and hip flexion.  No radiation to his scrotum.  No trauma or injury involved.  He denies numbness or tingling.  No tenderness over trochanteric bursa.  He could have an occult fracture or labral tear.  ?-Orthopedic surgery consulted and recommended checking CRP and ESR ?-Continue pain control ?-PT/OT ? ?Normocytic anemia ?Recent Labs  ?  08/23/21 ?1112 08/27/21 ?1141 10/05/21 ?6237 10/31/21 ?6283 11/26/21 ?1158 12/29/21 ?1235 04/04/22 ?1613 04/11/22 ?1232 04/15/22 ?2250 04/16/22 ?0850  ?HGB 8.8* 10.3* 10.5* 8.7* 9.2* 10.4* 10.1* 8.8* 10.4* 10.3*  ?Stable.  Anemia panel consistent with anemia of chronic disease.  Continue monitoring. ? ? ?CKD-3A ?Recent Labs  ?  08/23/21 ?1002 08/27/21 ?1141 10/05/21 ?1517 10/31/21 ?6160 12/29/21 ?1235 01/12/22 ?1144 04/04/22 ?1613 04/11/22 ?1232 04/15/22 ?2250 04/16/22 ?0850  ?BUN '18 17 16 20 17 21 19 18 17 18  ' ?  CREATININE 1.21 1.35* 1.65* 1.36* 1.61* 1.34* 1.26* 1.02 1.42* 1.44*  ?AKI ruled out.  Creatinine seems to be at baseline. ?-Continue monitoring ? ? ?Nausea & vomiting ?Improved.  Basic labs and recent CT abdomen and pelvis without significant finding.  Abdominal exam benign.  Could be related to opiate.  He seems he was started on morphine about 3 days ago. ?-Continue antiemetics and IV fluid ?-Bowel regimen ? ?Persistent atrial fibrillation ([place]) ?Rate controlled without medication.  On Eliquis for anticoagulation. ?-Continue Eliquis-might  have to hold if surgery is indicated ? ? ?DVT prophylaxis:  ? ?apixaban (ELIQUIS) tablet 5 mg  ?Code Status: Full code ?Family Communication: Patient and/or RN. Available if any question.  ?Level of care: Med-Surg ?Status is: Observation ?The patient will require care spanning > 2 midnights and should be moved to inpatient because: Intractable right hip pain requiring pain medication and nausea and vomiting requiring IV fluid. ? ? ?Final disposition: Likely home once medically stable. ?Consultants:  ?Orthopedic surgery ? ?Sch Meds:  ?Scheduled Meds: ? acetaminophen  650 mg Oral Q6H  ? apixaban  5 mg Oral BID  ? lidocaine  1 patch Transdermal Q24H  ? senna  1 tablet Oral Daily  ? ?Continuous Infusions: ? sodium chloride 85 mL/hr at 04/16/22 0701  ? ?PRN Meds:.bisacodyl, gabapentin, HYDROmorphone (DILAUDID) injection, polyethylene glycol, prochlorperazine, tiZANidine ? ?Antimicrobials: ?Anti-infectives (From admission, onward)  ? ? None  ? ?  ? ? ? ?I have personally reviewed the following labs and images: ?CBC: ?Recent Labs  ?Lab 04/11/22 ?1232 04/15/22 ?2250 04/16/22 ?0850  ?WBC 5.6 5.0 5.6  ?NEUTROABS 3.4  --   --   ?HGB 8.8* 10.4* 10.3*  ?HCT 26.7* 31.1* 30.7*  ?MCV 105.1* 102.3* 104.1*  ?PLT 244 278 249  ? ?BMP &GFR ?Recent Labs  ?Lab 04/11/22 ?1232 04/15/22 ?2250 04/16/22 ?0850  ?NA 142 141 139  ?K 3.7 4.7 4.2  ?CL 119* 110 109  ?CO2 '23 24 25  ' ?GLUCOSE 89 99 120*  ?BUN '18 17 18  ' ?CREATININE 1.02 1.42* 1.44*  ?CALCIUM 7.0* 9.2 8.9  ?MG  --   --  1.9  ?PHOS  --   --  2.8  ? ?Estimated Creatinine Clearance: 45.6 mL/min (A) (by C-G formula based on SCr of 1.44 mg/dL (H)). ?Liver & Pancreas: ?Recent Labs  ?Lab 04/11/22 ?1232 04/16/22 ?0850  ?AST 14*  --   ?ALT 15  --   ?ALKPHOS 56  --   ?BILITOT 1.2  --   ?PROT 5.4*  --   ?ALBUMIN 3.4* 4.1  ? ?Recent Labs  ?Lab 04/11/22 ?1232  ?LIPASE 28  ? ?No results for input(s): AMMONIA in the last 168 hours. ?Diabetic: ?No results for input(s): HGBA1C in the last 72 hours. ?No  results for input(s): GLUCAP in the last 168 hours. ?Cardiac Enzymes: ?No results for input(s): CKTOTAL, CKMB, CKMBINDEX, TROPONINI in the last 168 hours. ?Recent Labs  ?  01/12/22 ?1144  ?PROBNP 261  ? ?Coagulation Profile: ?No results for input(s): INR, PROTIME in the last 168 hours. ?Thyroid Function Tests: ?No results for input(s): TSH, T4TOTAL, FREET4, T3FREE, THYROIDAB in the last 72 hours. ?Lipid Profile: ?No results for input(s): CHOL, HDL, LDLCALC, TRIG, CHOLHDL, LDLDIRECT in the last 72 hours. ?Anemia Panel: ?Recent Labs  ?  04/16/22 ?0850  ?VITAMINB12 641  ?FOLATE 13.8  ?FERRITIN 632*  ?TIBC 224*  ?IRON 69  ?RETICCTPCT 1.4  ? ?Urine analysis: ?   ?Component Value Date/Time  ? COLORURINE YELLOW 04/15/2022 2250  ?  APPEARANCEUR CLEAR 04/15/2022 2250  ? LABSPEC 1.016 04/15/2022 2250  ? PHURINE 6.0 04/15/2022 2250  ? GLUCOSEU NEGATIVE 04/15/2022 2250  ? HGBUR NEGATIVE 04/15/2022 2250  ? [place] NEGATIVE 04/15/2022 2250  ? [place] NEGATIVE 04/15/2022 2250  ? PROTEINUR NEGATIVE 04/15/2022 2250  ? UROBILINOGEN 1.0 12/11/2011 0005  ? NITRITE NEGATIVE 04/15/2022 2250  ? LEUKOCYTESUR NEGATIVE 04/15/2022 2250  ? ?Sepsis Labs: ?Invalid input(s): PROCALCITONIN, LACTICIDVEN ? ?Microbiology: ?No results found for this or any previous visit (from the past 240 hour(s)). ? ?Radiology Studies: ?No results found. ? ? ? ?Lenaya Pietsch T. Dyann Goodspeed ?[facility] ? ?If 7PM-7AM, please contact night-coverage ?[email or web address] ?04/16/2022, 11:17 AM   ?

## 2022-04-16 NOTE — Hospital Course (Addendum)
86 year old M with PMH of bradycardia/PPM, A-fib on Eliquis, CKD-3A, bilateral THA and prostate cancer returning to ED for the third time with right hip pain with associated nausea and vomiting, and admitted for the same.  Patient had basically negative work-up including basic labs, CT abdomen and pelvis and MRI of the right hip and lumbar spine on 5/1 when he was seen in ED. ? ?On arrival to ED, vitals stable.  Basic labs including BMP and CBC without significant finding.  CRP and ESR within normal.  Orthopedic surgery consulted and did not think there is surgical indication.  Patient with significant pain and with inability to stand independently and continued need for periodic IV pain medication.   ? ?Therapy recommended SNF. ?

## 2022-04-16 NOTE — Assessment & Plan Note (Addendum)
Recent Labs  ?  08/27/21 ?1141 10/05/21 ?1438 10/31/21 ?8875 11/26/21 ?1158 12/29/21 ?1235 04/04/22 ?1613 04/11/22 ?1232 04/15/22 ?2250 04/16/22 ?0850 04/17/22 ?0128  ?HGB 10.3* 10.5* 8.7* 9.2* 10.4* 10.1* 8.8* 10.4* 10.3* 9.1*  ?Stable.  Anemia panel consistent with anemia of chronic disease.  Continue monitoring. ? ?

## 2022-04-16 NOTE — Assessment & Plan Note (Addendum)
Rate controlled without medication.  On Eliquis for anticoagulation. ?-Continue home Eliquis. ?

## 2022-04-16 NOTE — Assessment & Plan Note (Addendum)
History of right THA in 06/2017 in [facility] and remote left THA.  MRI lumbar spine on 5/1 with a stable lumbar disc and facet degeneration and moderate multilevel neuroforaminal stenosis but no high-grade spinal stenosis.  MRI right hip showed previous bilateral THA but no joint effusion, osteolysis or pseudotumor.  CT A/P without significant finding.  CRP and ESR within normal.  Pain is localized in right hip.  No radiation to his legs.  He has no radiculopathy, numbness or tingling.  No bowel or bladder habit change.  Again, difficult to rule out occult fracture.  ?-[facility] consulted and did not see indication for surgical intervention. ?-Patient continues to have significant right hip pain with weightbearing and range of motion in right hip ?-Continue pain control scheduled Tylenol, as needed oxy and IV Dilaudid ?-PT/OT-recommended SNF. ?

## 2022-04-17 DIAGNOSIS — Z888 Allergy status to other drugs, medicaments and biological substances status: Secondary | ICD-10-CM | POA: Diagnosis not present

## 2022-04-17 DIAGNOSIS — N179 Acute kidney failure, unspecified: Secondary | ICD-10-CM | POA: Diagnosis present

## 2022-04-17 DIAGNOSIS — R112 Nausea with vomiting, unspecified: Secondary | ICD-10-CM | POA: Diagnosis present

## 2022-04-17 DIAGNOSIS — I4819 Other persistent atrial fibrillation: Secondary | ICD-10-CM | POA: Diagnosis present

## 2022-04-17 DIAGNOSIS — Z9049 Acquired absence of other specified parts of digestive tract: Secondary | ICD-10-CM | POA: Diagnosis not present

## 2022-04-17 DIAGNOSIS — Z87442 Personal history of urinary calculi: Secondary | ICD-10-CM | POA: Diagnosis not present

## 2022-04-17 DIAGNOSIS — Z7901 Long term (current) use of anticoagulants: Secondary | ICD-10-CM | POA: Diagnosis not present

## 2022-04-17 DIAGNOSIS — M25551 Pain in right hip: Secondary | ICD-10-CM | POA: Diagnosis present

## 2022-04-17 DIAGNOSIS — Z8249 Family history of ischemic heart disease and other diseases of the circulatory system: Secondary | ICD-10-CM | POA: Diagnosis not present

## 2022-04-17 DIAGNOSIS — R001 Bradycardia, unspecified: Secondary | ICD-10-CM | POA: Diagnosis present

## 2022-04-17 DIAGNOSIS — Z87891 Personal history of nicotine dependence: Secondary | ICD-10-CM | POA: Diagnosis not present

## 2022-04-17 DIAGNOSIS — Z79899 Other long term (current) drug therapy: Secondary | ICD-10-CM | POA: Diagnosis not present

## 2022-04-17 DIAGNOSIS — Z981 Arthrodesis status: Secondary | ICD-10-CM | POA: Diagnosis not present

## 2022-04-17 DIAGNOSIS — Z881 Allergy status to other antibiotic agents status: Secondary | ICD-10-CM | POA: Diagnosis not present

## 2022-04-17 DIAGNOSIS — R52 Pain, unspecified: Secondary | ICD-10-CM | POA: Diagnosis not present

## 2022-04-17 DIAGNOSIS — Z96643 Presence of artificial hip joint, bilateral: Secondary | ICD-10-CM | POA: Diagnosis present

## 2022-04-17 DIAGNOSIS — N1831 Chronic kidney disease, stage 3a: Secondary | ICD-10-CM | POA: Diagnosis present

## 2022-04-17 DIAGNOSIS — Z8546 Personal history of malignant neoplasm of prostate: Secondary | ICD-10-CM | POA: Diagnosis not present

## 2022-04-17 DIAGNOSIS — M1990 Unspecified osteoarthritis, unspecified site: Secondary | ICD-10-CM | POA: Diagnosis present

## 2022-04-17 DIAGNOSIS — Z885 Allergy status to narcotic agent status: Secondary | ICD-10-CM | POA: Diagnosis not present

## 2022-04-17 DIAGNOSIS — D509 Iron deficiency anemia, unspecified: Secondary | ICD-10-CM | POA: Diagnosis present

## 2022-04-17 DIAGNOSIS — Z950 Presence of cardiac pacemaker: Secondary | ICD-10-CM | POA: Diagnosis not present

## 2022-04-17 DIAGNOSIS — G8929 Other chronic pain: Secondary | ICD-10-CM | POA: Diagnosis present

## 2022-04-17 LAB — MAGNESIUM: Magnesium: 1.8 mg/dL (ref 1.7–2.4)

## 2022-04-17 LAB — CBC
HCT: 27.2 % — ABNORMAL LOW (ref 39.0–52.0)
Hemoglobin: 9.1 g/dL — ABNORMAL LOW (ref 13.0–17.0)
MCH: 34.5 pg — ABNORMAL HIGH (ref 26.0–34.0)
MCHC: 33.5 g/dL (ref 30.0–36.0)
MCV: 103 fL — ABNORMAL HIGH (ref 80.0–100.0)
Platelets: 243 10*3/uL (ref 150–400)
RBC: 2.64 MIL/uL — ABNORMAL LOW (ref 4.22–5.81)
RDW: 22.8 % — ABNORMAL HIGH (ref 11.5–15.5)
WBC: 4.3 10*3/uL (ref 4.0–10.5)
nRBC: 0.5 % — ABNORMAL HIGH (ref 0.0–0.2)

## 2022-04-17 LAB — RENAL FUNCTION PANEL
Albumin: 3.5 g/dL (ref 3.5–5.0)
Anion gap: 5 (ref 5–15)
BUN: 19 mg/dL (ref 8–23)
CO2: 25 mmol/L (ref 22–32)
Calcium: 8.8 mg/dL — ABNORMAL LOW (ref 8.9–10.3)
Chloride: 111 mmol/L (ref 98–111)
Creatinine, Ser: 1.38 mg/dL — ABNORMAL HIGH (ref 0.61–1.24)
GFR, Estimated: 50 mL/min — ABNORMAL LOW (ref >60)
Glucose, Bld: 95 mg/dL (ref 70–99)
Phosphorus: 3 mg/dL (ref 2.5–4.6)
Potassium: 4.8 mmol/L (ref 3.5–5.1)
Sodium: 141 mmol/L (ref 135–145)

## 2022-04-17 MED ORDER — HYDROMORPHONE HCL 1 MG/ML IJ SOLN
0.5000 mg | INTRAMUSCULAR | Status: DC | PRN
  Administered 2022-04-17 – 2022-04-21 (×6): 0.5 mg via INTRAVENOUS
  Filled 2022-04-17 (×6): qty 0.5

## 2022-04-17 MED ORDER — OXYCODONE HCL 5 MG PO TABS
5.0000 mg | ORAL_TABLET | Freq: Four times a day (QID) | ORAL | Status: DC | PRN
  Administered 2022-04-17 – 2022-04-19 (×5): 5 mg via ORAL
  Filled 2022-04-17 (×5): qty 1

## 2022-04-17 NOTE — Progress Notes (Signed)
?PROGRESS NOTE ? ?Todd Salazar YSA:[id number] DOB: [birth year]  ? ?PCP: [name], DO ? ?Patient is from: Home.  Lives alone.  Recently started using walker due to right hip pain. ? ?DOA: 04/15/2022 LOS: 0 ? ?Chief complaints ?Chief Complaint  ?Patient presents with  ? Back Pain  ?  ? ?Brief Narrative / Interim history: ?86 year old M with PMH of bradycardia/PPM, A-fib on Eliquis, CKD-3A, bilateral THA and prostate cancer returning to ED for the third time with right hip pain with associated nausea and vomiting, and admitted for the same.  Patient had basically negative work-up including basic labs, CT abdomen and pelvis and MRI of the right hip and lumbar spine on 5/1 when he was seen in ED. ? ?On arrival to ED, vitals stable.  Basic labs including BMP and CBC without significant finding.  CRP and ESR within normal.  Orthopedic surgery consulted and did not think there is surgical indication.  Patient with significant pain and with inability to stand independently and continued need for periodic IV pain medication.   ? ?Therapy recommended SNF.  ? ?Subjective: ?Seen and examined earlier this morning.  No major events overnight of this morning.  Continues to endorse severe pain in his right hip with weightbearing and leg movement.  No radiation to his leg.  He denies numbness or tingling.  He denies bowel or bladder habit change. ? ?Objective: ?Vitals:  ? 04/16/22 1944 04/17/22 3235 04/17/22 0515 04/17/22 5732  ?BP: (!) 108/56 (!) 99/49 117/66 (!) 91/58  ?Pulse: 70 64 67 60  ?Resp: _0 ?Temp: 98.3 ?F (36.8 ?C) 98.3 ?F (36.8 ?C) 98 ?F (36.7 ?C) 98.1 ?F (36.7 ?C)  ?TempSrc: Oral Oral Oral Oral  ?SpO2: 99% 96% 98% 97%  ?Weight:      ?Height:      ? ? ?Examination: ? ?GENERAL: No apparent distress.  Nontoxic. ?HEENT: MMM.  Vision and hearing grossly intact.  ?NECK: Supple.  No apparent JVD.  ?RESP:  No IWOB.  Fair aeration bilaterally. ?CVS:  RRR. Heart sounds normal.  ?ABD/GI/GU: BS+. Abd soft, NTND.   ?MSK/EXT:  Moves extremities.  Significant pain in right hip with passive and active range of motion.  No focal tenderness.  No overlying skin erythema or swelling. ?SKIN: no apparent skin lesion or wound ?NEURO: Awake and alert. Oriented appropriately.  No apparent focal neuro deficit. ?PSYCH: Calm. Normal affect.  ? ?Procedures:  ?None ? ?Microbiology summarized: ?None ? ?Assessment and Plan: ?Acute right hip pain ?History of right THA in 06/2017 in [facility] and remote left THA.  MRI lumbar spine on 5/1 with a stable lumbar disc and facet degeneration and moderate multilevel neuroforaminal stenosis but no high-grade spinal stenosis.  MRI right hip showed previous bilateral THA but no joint effusion, osteolysis or pseudotumor.  CT A/P without significant finding.  CRP and ESR within normal.  Pain is localized in right hip.  No radiation to his legs.  He has no radiculopathy, numbness or tingling.  No bowel or bladder habit change.  Again, difficult to rule out occult fracture.  ?-[facility] consulted and did not see indication for surgical intervention. ?-Patient continues to have significant right hip pain with weightbearing and range of motion in right hip ?-Continue pain control scheduled Tylenol, as needed oxy and IV Dilaudid ?-PT/OT-recommended SNF. ? ?Normocytic anemia ?Recent Labs  ?  08/27/21 ?1141 10/05/21 ?2025 10/31/21 ?4270 11/26/21 ?1158 12/29/21 ?1235 04/04/22 ?1613 04/11/22 ?1232 04/15/22 ?2250 04/16/22 ?0850 04/17/22 ?0128  ?  HGB 10.3* 10.5* 8.7* 9.2* 10.4* 10.1* 8.8* 10.4* 10.3* 9.1*  ?Stable.  Anemia panel consistent with anemia of chronic disease.  Continue monitoring. ? ? ?CKD-3A ?Recent Labs  ?  08/27/21 ?1141 10/05/21 ?9381 10/31/21 ?0175 12/29/21 ?1235 01/12/22 ?1144 04/04/22 ?1613 04/11/22 ?1232 04/15/22 ?2250 04/16/22 ?0850 04/17/22 ?0128  ?BUN _0 ?CREATININE 1.35* 1.65* 1.36* 1.61* 1.34* 1.26* 1.02 1.42* 1.44* 1.38*  ?AKI ruled out.  Creatinine seems to be at  baseline. ?-Continue monitoring ? ? ?Nausea & vomiting ?Seems to have resolved.  Basic labs and recent CT A/P without significant finding.  Abdominal exam benign.  Could be related to opiate.  He seems he was started on morphine about 3 days prior to admission. ?-Continue antiemetics and IV fluid ?-Bowel regimen ? ?Persistent atrial fibrillation ([place]) ?Rate controlled without medication.  On Eliquis for anticoagulation. ?-Continue home Eliquis. ? ? ?DVT prophylaxis:  ? ?apixaban (ELIQUIS) tablet 5 mg  ?Code Status: Full code ?Family Communication: Patient and/or RN. Available if any question.  ?Level of care: Med-Surg ?Status is: Inpatient ?The patient will remain inpatient because: Intractable right hip pain requiring IV pain medication for pain control and inability to ambulate independently.  Therapy recommends SNF. ? ? ?Final disposition: SNF? ?Consultants:  ?Orthopedic surgery ? ?Sch Meds:  ?Scheduled Meds: ? acetaminophen  650 mg Oral Q6H  ? apixaban  5 mg Oral BID  ? lidocaine  1 patch Transdermal Q24H  ? senna  1 tablet Oral Daily  ? ?Continuous Infusions: ? ? ?PRN Meds:.bisacodyl, gabapentin, HYDROmorphone (DILAUDID) injection, oxyCODONE, polyethylene glycol, prochlorperazine, tiZANidine ? ?Antimicrobials: ?Anti-infectives (From admission, onward)  ? ? None  ? ?  ? ? ? ?I have personally reviewed the following labs and images: ?CBC: ?Recent Labs  ?Lab 04/11/22 ?1232 04/15/22 ?2250 04/16/22 ?0850 04/17/22 ?0128  ?WBC 5.6 5.0 5.6 4.3  ?NEUTROABS 3.4  --   --   --   ?HGB 8.8* 10.4* 10.3* 9.1*  ?HCT 26.7* 31.1* 30.7* 27.2*  ?MCV 105.1* 102.3* 104.1* 103.0*  ?PLT 244 278 249 243  ? ?BMP &GFR ?Recent Labs  ?Lab 04/11/22 ?1232 04/15/22 ?2250 04/16/22 ?0850 04/17/22 ?0128  ?NA 142 141 139 141  ?K 3.7 4.7 4.2 4.8  ?CL 119* 110 109 111  ?CO2 _1 ?GLUCOSE 89 99 120* 95  ?BUN _2 ?CREATININE 1.02 1.42* 1.44* 1.38*  ?CALCIUM 7.0* 9.2 8.9 8.8*  ?MG  --   --  1.9 1.8  ?PHOS  --   --  2.8 3.0   ? ?Estimated Creatinine Clearance: 47.6 mL/min (A) (by C-G formula based on SCr of 1.38 mg/dL (H)). ?Liver & Pancreas: ?Recent Labs  ?Lab 04/11/22 ?1232 04/16/22 ?0850 04/17/22 ?0128  ?AST 14*  --   --   ?ALT 15  --   --   ?ALKPHOS 56  --   --   ?BILITOT 1.2  --   --   ?PROT 5.4*  --   --   ?ALBUMIN 3.4* 4.1 3.5  ? ?Recent Labs  ?Lab 04/11/22 ?1232  ?LIPASE 28  ? ?No results for input(s): AMMONIA in the last 168 hours. ?Diabetic: ?No results for input(s): HGBA1C in the last 72 hours. ?No results for input(s): GLUCAP in the last 168 hours. ?Cardiac Enzymes: ?No results for input(s): CKTOTAL, CKMB, CKMBINDEX, TROPONINI in the last 168 hours. ?Recent Labs  ?  01/12/22 ?1144  ?PROBNP 261  ? ?Coagulation Profile: ?  No results for input(s): INR, PROTIME in the last 168 hours. ?Thyroid Function Tests: ?No results for input(s): TSH, T4TOTAL, FREET4, T3FREE, THYROIDAB in the last 72 hours. ?Lipid Profile: ?No results for input(s): CHOL, HDL, LDLCALC, TRIG, CHOLHDL, LDLDIRECT in the last 72 hours. ?Anemia Panel: ?Recent Labs  ?  04/16/22 ?0850  ?VITAMINB12 641  ?FOLATE 13.8  ?FERRITIN 632*  ?TIBC 224*  ?IRON 69  ?RETICCTPCT 1.4  ? ?Urine analysis: ?   ?Component Value Date/Time  ? COLORURINE YELLOW 04/15/2022 2250  ? APPEARANCEUR CLEAR 04/15/2022 2250  ? LABSPEC 1.016 04/15/2022 2250  ? PHURINE 6.0 04/15/2022 2250  ? GLUCOSEU NEGATIVE 04/15/2022 2250  ? HGBUR NEGATIVE 04/15/2022 2250  ? [place] NEGATIVE 04/15/2022 2250  ? [place] NEGATIVE 04/15/2022 2250  ? PROTEINUR NEGATIVE 04/15/2022 2250  ? UROBILINOGEN 1.0 12/11/2011 0005  ? NITRITE NEGATIVE 04/15/2022 2250  ? LEUKOCYTESUR NEGATIVE 04/15/2022 2250  ? ?Sepsis Labs: ?Invalid input(s): PROCALCITONIN, LACTICIDVEN ? ?Microbiology: ?No results found for this or any previous visit (from the past 240 hour(s)). ? ?Radiology Studies: ?No results found. ? ? ? ?Jaylianna Tatlock T. Zakari Bathe ?[facility] ? ?If 7PM-7AM, please contact night-coverage ?[email or web address] ?04/17/2022, 2:20 PM   ?

## 2022-04-17 NOTE — Progress Notes (Signed)
Subjective: ?Mr. Corallo notes improved right hip and flank pain with rest and pain meds.  Son is at bedside.  Tolerating regular diet.  No hip pain at rest. ? ?Objective: ?Vital signs in last 24 hours: ?Temp:  [97.9 ?F (36.6 ?C)-98.3 ?F (36.8 ?C)] 98.1 ?F (36.7 ?C) (05/07 9323) ?Pulse Rate:  [60-70] 60 (05/07 0833) ?Resp:  [15-20] 15 (05/07 5573) ?BP: (91-117)/(49-70) 91/58 (05/07 2202) ?SpO2:  [96 %-100 %] 97 % (05/07 0833) ? ?Intake/Output from previous day: ?05/06 0701 - 05/07 0700 ?In: 792.6 [P.O.:240; I.V.:552.6] ?Out: 100 [Urine:100] ?Intake/Output this shift: ?No intake/output data recorded. ? ?Recent Labs  ?  04/15/22 ?2250 04/16/22 ?0850 04/17/22 ?0128  ?HGB 10.4* 10.3* 9.1*  ? ?Recent Labs  ?  04/16/22 ?0850 04/17/22 ?0128  ?WBC 5.6 4.3  ?RBC 2.95*  2.94* 2.64*  ?HCT 30.7* 27.2*  ?PLT 249 243  ? ?Recent Labs  ?  04/16/22 ?0850 04/17/22 ?0128  ?NA 139 141  ?K 4.2 4.8  ?CL 109 111  ?CO2 25 25  ?BUN 18 19  ?CREATININE 1.44* 1.38*  ?GLUCOSE 120* 95  ?CALCIUM 8.9 8.8*  ? ?No results for input(s): LABPT, INR in the last 72 hours. ? ?24 wd male in nad.  A andO.  EOMI.  Resp unlabored. ? ? ? ?Assessment/Plan: ?R hip pain - I explained the normal lab results, absence of physical exam findings and normal imaging studies again to the patient and his son.  His pain does not seem to be related to his THA on the R.  Would consider eval by Dr. Ronnald Ramp to see if additional spine imaging studies might be of some use.  I'll sign off.  Dr. Alvan Dame is happy to see the patient back in the office 2-3 weeks after discharge to f/u his total hip replacement.  The patient and his son understand the plan and agree. ? ?Please call [phone] if there are any questions. ? ? ? ? ?Wylene Simmer ?04/17/2022, 2:07 PM  ? ? ? ? ?

## 2022-04-18 DIAGNOSIS — R52 Pain, unspecified: Secondary | ICD-10-CM | POA: Diagnosis not present

## 2022-04-18 NOTE — Progress Notes (Signed)
Occupational Therapy Treatment ?Patient Details ?Name: Todd Salazar ?MRN: [id number] ?DOB: June 11, 1936 ?Today's Date: 04/18/2022 ? ? ?History of present illness Pt is a 86 y.o. M who presents 04/15/2022 who returns to ED for third time with right hip pain. MRI right hip showing no large hip joint effusion, osteolysis, or pseudotumor. CTA showing no acute findings in abdomen or pelvis. 4/24 X-ray showing no acute findings. Significant PMH: atrial fibrillation, bilateral THA, laminectomy, prostate surgery. ?  ?OT comments ? Patient in bathroom on toilet upon arrival. Patient was able to perform toilet hygiene seated and was min guard to stand from toilet and perform grooming standing at sink. Patient ambulated to recliner to attempt sitting up. Patient stated he was in too much pain to tolerating sitting in chair and asked to return to bed. Patient continues to be limited by pain but is making progress with mobility.    ? ?Recommendations for follow up therapy are one component of a multi-disciplinary discharge planning process, led by the attending physician.  Recommendations may be updated based on patient status, additional functional criteria and insurance authorization. ?   ?Follow Up Recommendations ? Skilled nursing-short term rehab (<3 hours/day)  ?  ?Assistance Recommended at Discharge Frequent or constant Supervision/Assistance  ?Patient can return home with the following ? Two people to help with bathing/dressing/bathroom;A lot of help with walking and/or transfers ?  ?Equipment Recommendations ? BSC/3in1  ?  ?Recommendations for Other Services   ? ?  ?Precautions / Restrictions Precautions ?Precautions: Fall ?Restrictions ?Weight Bearing Restrictions: No  ? ? ?  ? ?Mobility Bed Mobility ?Overal bed mobility: Needs Assistance ?Bed Mobility: Sit to Supine ?  ?  ?  ?Sit to supine: Mod assist ?  ?General bed mobility comments: assistance to manage RLE ?  ? ?Transfers ?Overall transfer level: Needs  assistance ?Equipment used: Rolling walker (2 wheels) ?Transfers: Sit to/from Stand, Bed to chair/wheelchair/BSC ?Sit to Stand: Min assist ?  ?  ?Step pivot transfers: Min guard ?  ?  ?General transfer comment: min guard to stand from toilet, min assist to stand from recliner due to lower surface ?  ?  ?Balance Overall balance assessment: Needs assistance ?Sitting-balance support: Bilateral upper extremity supported, Feet supported ?Sitting balance-Leahy Scale: Poor ?Sitting balance - Comments: attempted sitting in recliner but too much discomfort ?  ?Standing balance support: Single extremity supported, During functional activity ?Standing balance-Leahy Scale: Poor ?Standing balance comment: stood at sink for grooming ?  ?  ?  ?  ?  ?  ?  ?  ?  ?  ?  ?   ? ?ADL either performed or assessed with clinical judgement  ? ?ADL Overall ADL's : Needs assistance/impaired ?  ?  ?Grooming: Wash/dry hands;Wash/dry face;Supervision/safety;Standing ?Grooming Details (indicate cue type and reason): at sink ?  ?  ?  ?  ?  ?  ?  ?  ?Toilet Transfer: Min guard;Regular Toilet;Comfort height toilet ?Toilet Transfer Details (indicate cue type and reason): limited by pain ?Toileting- Clothing Manipulation and Hygiene: Modified independent;Sitting/lateral lean ?  ?  ?  ?  ?General ADL Comments: Patient in bathroom on toilet upon arrival ?  ? ?Extremity/Trunk Assessment   ?  ?  ?  ?  ?  ? ?Vision   ?  ?  ?Perception   ?  ?Praxis   ?  ? ?Cognition Arousal/Alertness: Awake/alert ?Behavior During Therapy: [facility] for tasks assessed/performed ?Overall Cognitive Status: Within Functional Limits for tasks assessed ?  ?  ?  ?  ?  ?  ?  ?  ?  ?  ?  ?  ?  ?  ?  ?  ?  ?  ?  ?   ?  Exercises   ? ?  ?Shoulder Instructions   ? ? ?  ?General Comments    ? ? ?Pertinent Vitals/ Pain       Pain Assessment ?Pain Assessment: Faces ?Faces Pain Scale: Hurts whole lot ?Pain Location: R hip ?Pain Descriptors / Indicators: Grimacing, Sharp ?Pain Intervention(s):  Limited activity within patient's tolerance, Monitored during session, Repositioned ? ?Home Living   ?  ?  ?  ?  ?  ?  ?  ?  ?  ?  ?  ?  ?  ?  ?  ?  ?  ?  ? ?  ?Prior Functioning/Environment    ?  ?  ?  ?   ? ?Frequency ? Min 2X/week  ? ? ? ? ?  ?Progress Toward Goals ? ?OT Goals(current goals can now be found in the care plan section) ? Progress towards OT goals: Progressing toward goals ? ?Acute Rehab OT Goals ?Patient Stated Goal: less pain ?OT Goal Formulation: With patient ?Time For Goal Achievement: 04/30/22 ?Potential to Achieve Goals: Good ?ADL Goals ?Pt Will Perform Lower Body Dressing: with min assist;sit to/from stand;with adaptive equipment ?Pt Will Transfer to Toilet: with min assist;stand pivot transfer;bedside commode ?Pt Will Perform Toileting - Clothing Manipulation and hygiene: with min assist;sit to/from stand;sitting/lateral leans ?Additional ADL Goal #1: Pt will perform bed mobility with Min Guard A in preparation for ADLs  ?Plan Discharge plan remains appropriate   ? ?Co-evaluation ? ? ?   ?  ?  ?  ?  ? ?  ?AM-PAC OT "6 Clicks" Daily Activity     ?Outcome Measure ? ? Help from another person eating meals?: A Little ?Help from another person taking care of personal grooming?: A Little ?Help from another person toileting, which includes using toliet, bedpan, or urinal?: A Little ?Help from another person bathing (including washing, rinsing, drying)?: A Lot ?Help from another person to put on and taking off regular upper body clothing?: A Little ?Help from another person to put on and taking off regular lower body clothing?: A Lot ?6 Click Score: 16 ? ?  ?End of Session Equipment Utilized During Treatment: Rolling walker (2 wheels) ? ?OT Visit Diagnosis: Unsteadiness on feet (R26.81);Other abnormalities of gait and mobility (R26.89);Muscle weakness (generalized) (M62.81) ?  ?Activity Tolerance Patient limited by pain ?  ?Patient Left in bed;with call bell/phone within reach;with bed alarm set ?   ?Nurse Communication Mobility status ?  ? ?   ? ?Time: 1031-1050 ?OT Time Calculation (min): 19 min ? ?Charges: OT General Charges ?$OT Visit: 1 Visit ?OT Treatments ?$Self Care/Home Management : 8-22 mins ? ?Lodema Hong, OTA ?Acute Rehabilitation Services  ?Pager [phone] ?Office [phone] ? ? ?[place] ?04/18/2022, 12:14 PM ?

## 2022-04-18 NOTE — Progress Notes (Signed)
Subjective: ?We were asked to see this 86 year old gentleman who is status post L4-5 fusion about 3 years ago who had the sudden onset of right lateral hip pain on Sunday.  He could not bear weight on his leg.  He has no pain at rest.  He has some chronic back pain but that is a minor complaint.  He denies pain down the leg and he denies numbness tingling or weakness in the leg.  He does not have groin pain.  He is status post right total hip arthroplasty 3 to 4 years ago and the hip has been evaluated by orthopedics.  The patient has been managed in our pain management clinic and was scheduled for a spinal cord stimulator placement on 04/29/2022 with Dr. Davy Pique.  Dr. Davy Pique knows about this flareup of right hip pain.  He has had a CT of the pelvis and the lumbar spine and an MRI of the hip and the lumbar spine. ? ?Objective: ?Vital signs in last 24 hours: ?Temp:  [98.1 ?F (36.7 ?C)-98.4 ?F (36.9 ?C)] 98.4 ?F (36.9 ?C) (05/08 3016) ?Pulse Rate:  [62-110] 62 (05/08 0917) ?Resp:  [15-17] 16 (05/08 0917) ?BP: (92-118)/(54-66) 109/54 (05/08 0917) ?SpO2:  [95 %-97 %] 97 % (05/08 0917) ? ?Intake/Output from previous day: ?No intake/output data recorded. ?Intake/Output this shift: ?Total I/O ?In: 300 [P.O.:300] ?Out: -  ? ?On exam he is awake and alert and interactive.  No aphasia good attention span.  No facial asymmetry.  He has 5 out of 5 strength in the lower extremities with normal sensation and normal reflexes.  He has pain with both active and passive range of motion of the right lateral hip including flexion and external rotation and internal rotation.  He is nontender over the greater trochanter. ? ?Lab Results: ?Lab Results  ?Component Value Date  ? WBC 4.3 04/17/2022  ? HGB 9.1 (L) 04/17/2022  ? HCT 27.2 (L) 04/17/2022  ? MCV 103.0 (H) 04/17/2022  ? PLT 243 04/17/2022  ? ?Lab Results  ?Component Value Date  ? INR 1.2 11/25/2019  ? ?BMET ?Lab Results  ?Component Value Date  ? NA 141 04/17/2022  ? K 4.8  04/17/2022  ? CL 111 04/17/2022  ? CO2 25 04/17/2022  ? GLUCOSE 95 04/17/2022  ? BUN 19 04/17/2022  ? CREATININE 1.38 (H) 04/17/2022  ? CALCIUM 8.8 (L) 04/17/2022  ? ? ?Studies/Results: ?CT scan of the lumbar spine shows a solid fusion at L4-5 with no lucency around the instrumentation.  There is vacuum disc phenomena at L5-S1 without high-grade stenosis on the right.  MRI shows some adjacent level spondylosis at L3-4 on the right without obvious neural compression.  The studies have not changed. ? ?Assessment/Plan: ?While it is certainly possible that his pain is from his lumbar spine, especially L3-4 on the right or less likely radiculitis from L5-S1 on the right, pain in the lateral hip with passive movement or range of motion by the examiner is not typically indicative of spine disease or radiculopathy.  He does not describe radiculopathy down the leg or numbness or tingling.  Therefore, this makes me think his current pain syndrome is either myofascial, sprain strain, or some type of hip issue. ? ?If orthopedics is absolutely convinced that this pain is not his right hip, would consider a right L3-4 transforaminal epidural steroid injection. ? ?I have nothing to add from a surgical perspective and see nothing on his current imaging that would respond in any predictable way  to further spine surgery.  If no other etiology of his pain can be found could consider a CT myelogram of the lumbar spine to look for underfilling of a right-sided nerve root to explain his pain. ? ?Would have him follow-up with Dr. Davy Pique in our pain management center upon discharge. ?If he gets further imaging please let me know and I will be happy to look at it.  Please let us know if we can be of any further assistance ? ?Estimated body mass index is 23.7 kg/m? as calculated from the following: ?  Height as of this encounter: '6\' 3"'$  (1.905 m). ?  Weight as of this encounter: 86 kg. ? ? ? LOS: 1 day  ? ? ?Eustace Moore ?04/18/2022, 3:50  PM ? ? ? ? ?

## 2022-04-18 NOTE — [place] FL2 (Signed)
?[place] MEDICAID FL2 LEVEL OF CARE SCREENING TOOL  ?  ? ?IDENTIFICATION  ?Patient Name: ?Todd Salazar Birthdate: 05-20-1936 Sex: male Admission Date (Current Location): ?04/15/2022  ?[place] and [place] Number: ? [place] ?  Facility and Address:  ?[facility], [place] [address] [place], [place] [address] ?     Provider Number: ?[id number]  ?Attending Physician Name and Address:  ?Shelly Coss, MD ? Relative Name and Phone Number:  ?  ?   ?Current Level of Care: ?  Recommended Level of Care: ?[place] Prior Approval Number: ?  ? ?Date Approved/Denied: ?  PASRR Number: ?[id number] A ? ?Discharge Plan: ?SNF ?  ? ?Current Diagnoses: ?Patient Active Problem List  ? Diagnosis Date Noted  ? Intractable pain 04/16/2022  ? Nausea & vomiting 04/16/2022  ? CKD-3A 04/16/2022  ? Acute right hip pain 04/16/2022  ? Normocytic anemia 04/16/2022  ? Secondary hypercoagulable state ([place]) 08/27/2021  ? Persistent atrial fibrillation ([place]) 08/17/2021  ? S/P lumbar fusion 11/25/2019  ? Carcinoma of prostate ([place]) 09/07/2019  ? Lumbar radiculopathy 09/07/2019  ? Degeneration of lumbar intervertebral disc 09/07/2019  ? History of total replacement of both hip joints 08/01/2019  ? Iliotibial band syndrome of right side 07/27/2019  ? Macrocytic anemia 07/26/2019  ? Hypotension 07/26/2019  ? Bronchiectasis without complication ([place]) 12/87/8676  ? DOE (dyspnea on exertion) 10/19/2018  ? Lung nodule < 6cm on CT 10/09/2018  ? Occipital neuritis 08/29/2016  ? Bilateral hearing loss 06/22/2016  ? Post herpetic neuralgia 02/02/2016  ? Current use of long term anticoagulation 12/18/2015  ? Calcification of spleen 09/13/2014  ? Diverticulosis 09/13/2014  ? Bilateral renal cysts 09/13/2014  ? 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 Hemangioma of liver 09/13/2014  ? Male erectile dysfunction, unspecified 09/13/2014  ? 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 Paraphimosis 09/13/2014  ? Polyp of colon  09/13/2014  ? Restless legs syndrome 09/13/2014  ? Spondylosis without myelopathy or radiculopathy, cervical region 09/13/2014  ? Urethral stricture 09/13/2014  ? Spondylosis without myelopathy or radiculopathy, lumbosacral region 09/13/2014  ? Anxiety 09/13/2014  ? Chest pain at rest 12/11/2011  ? CAD (coronary artery disease), native coronary artery 12/11/2011  ? HTN (hypertension) 12/11/2011  ? Paroxysmal atrial fibrillation ([place]) 12/11/2011  ? ? ?Orientation RESPIRATION BLADDER Height & Weight   ?  ?Time, Self, Situation, Place ? Normal Continent Weight: 189 lb 9.5 oz (86 kg) ?Height:  '6\' 3"'$  (190.5 cm)  ?BEHAVIORAL SYMPTOMS/MOOD NEUROLOGICAL BOWEL NUTRITION STATUS  ?    Continent Diet  ?AMBULATORY STATUS COMMUNICATION OF NEEDS Skin   ?Limited Assist Verbally Surgical wounds ?  ?  ?  ?    ?     ?     ? ? ?Personal Care Assistance Level of Assistance  ?Bathing, Feeding, Dressing Bathing Assistance: Limited assistance ?Feeding assistance: Independent ?Dressing Assistance: Limited assistance ?   ? ?Functional Limitations Info  ?Sight, Hearing, Speech Sight Info: Adequate ?Hearing Info: Adequate ?Speech Info: Adequate  ? ? ?SPECIAL CARE FACTORS FREQUENCY  ?PT (By licensed PT), OT (By licensed OT)   ?  ?PT Frequency: 5x a week ?OT Frequency: 5x a week ?  ?  ?  ?   ? ? ?Contractures Contractures Info: Not present  ? ? ?Additional Factors Info  ?Code Status, Allergies Code Status Info: full ?Allergies Info: Ciprofloxacin   Tramadol   Duloxetine   Hydrocodone-acetaminophen   Benadryl (Diphenhydramine)   Nisoldipine   Methocarbamol   Ondansetron Hcl  Sulfa Antibiotics ?  ?  ?  ?   ? ?Current Medications (04/18/2022):  This is the current hospital active medication list ?Current Facility-Administered Medications  ?Medication Dose Route Frequency Provider Last Rate Last Admin  ? acetaminophen (TYLENOL) tablet 650 mg  650 mg Oral Q6H Opyd, Ilene Qua, MD   650 mg at 04/17/22 0515  ? apixaban (ELIQUIS) tablet 5 mg  5 mg Oral  BID Opyd, Ilene Qua, MD   5 mg at 04/18/22 1010  ? bisacodyl (DULCOLAX) EC tablet 5 mg  5 mg Oral Daily PRN Opyd, Ilene Qua, MD   5 mg at 04/17/22 0528  ? gabapentin (NEURONTIN) capsule 100 mg  100 mg Oral QHS PRN Opyd, Ilene Qua, MD      ? HYDROmorphone (DILAUDID) injection 0.5 mg  0.5 mg Intravenous Q4H PRN Wendee Beavers T, MD   0.5 mg at 04/17/22 1822  ? lidocaine (LIDODERM) 5 % 1 patch  1 patch Transdermal Q24H Opyd, Ilene Qua, MD   1 patch at 04/18/22 0558  ? oxyCODONE (Oxy IR/ROXICODONE) immediate release tablet 5 mg  5 mg Oral Q6H PRN Mercy Riding, MD   5 mg at 04/18/22 1247  ? polyethylene glycol (MIRALAX / GLYCOLAX) packet 17 g  17 g Oral Daily PRN Opyd, Ilene Qua, MD      ? prochlorperazine (COMPAZINE) injection 5 mg  5 mg Intravenous Q4H PRN Opyd, Ilene Qua, MD   5 mg at 04/17/22 1822  ? senna (SENOKOT) tablet 8.6 mg  1 tablet Oral Daily Wendee Beavers T, MD   8.6 mg at 04/18/22 1010  ? tiZANidine (ZANAFLEX) tablet 2 mg  2 mg Oral Q6H PRN Opyd, Ilene Qua, MD   2 mg at 04/18/22 0506  ? ? ? ?Discharge Medications: ?Please see discharge summary for a list of discharge medications. ? ?Relevant Imaging Results: ? ?Relevant Lab Results: ? ? ?Additional Information ?SSN [id number] ? ?Emeterio Reeve, LCSW ? ? ? ? ?

## 2022-04-18 NOTE — Progress Notes (Signed)
?PROGRESS NOTE ? ?Todd Salazar  SWF:[id number] DOB: October 16, 1936 DOA: 04/15/2022 ?PCP: [name], DO  ? ?Brief Narrative: ? ?Patient is a 86 year old male with history of bradycardia status post pacemaker, A-fib on Eliquis, CKD stage IIIa, bilateral total hip arthroplasty, prostate cancer who presented to the emergency department with complaint of right hip pain, isolated nausea and vomiting.  He recently had previous in the emergency department for the same problem.  Work-up done with labs, CT abdomen/pelvis, MRI of right hip and lumbar spine essentially negative.  Orthopedics were consulted and following and there is no indication for surgical management .hospital course remarkable for significant pain, inability to stand independently, need of IV medications.  PT/OT recommending SNF on discharge. ? ?Assessment & Plan: ? ?Principal Problem: ?  Intractable pain ?Active Problems: ?  Acute right hip pain ?  Persistent atrial fibrillation ([place]) ?  Nausea & vomiting ?  CKD-3A ?  Normocytic anemia ? ? ?Acute right hip pain: History of right THA on 06/2017, history of remote left THA.  Presented with severe right hip pain.  Work-up was negative.  MRI lumbar spine on 5/1 did not show any high-grade spinal stenosis.  MRI right hip showed previous bilateral THA but no significant joint effusion or osteolysis, no fracture or dislocation .  CT abdomen status post without significant finding.  ESR, CRP normal.  Pain is localized in the right hip, no radiation, no radiculopathy, no numbness and tingling, no bladder or bowel incontinence.  Orthopedics said there is no indication for surgical intervention.  Hospital course remarkable for frequent IV pain medications, problem  with weightbearing, decreased range of motion on the right hip.   ?We have requested Dr. Ronnald Ramp to see him as per recommendation from orthopedics, patient also interested on that.   ?Continue supportive care, pain management.  PT recommending skilled nursing  facility on  discharge. ? ?Normocytic anemia: Likely associated with chronic disease, hemoglobin currently stable in the range of 8-10.  Continue monitoring ? ?CKD stage IIIa: Currently kidney function at baseline ? ?Nausea/vomiting: Resolved.  CT abdomen/pelvis without any acute findings. ? ?Persistent atrial fibrillation: Currently rate is controlled without any medication.  On Eliquis for anticoagulation ? ? ?  ?  ? ?DVT prophylaxis: ?apixaban (ELIQUIS) tablet 5 mg  ? ?  Code Status: Full Code ? ?Family Communication: None at bedside ? ?Patient status:Inpatient ? ?Patient is from :Home ? ?Anticipated discharge to:SNF ? ?Estimated DC date:as soon as bed is available at SNF ? ? ?Consultants: Orthopedics ? ?Procedures:None ? ?Antimicrobials:  ?Anti-infectives (From admission, onward)  ? ? None  ? ?  ? ? ?Subjective: ?Patient seen and examined at the bedside this morning.  Hemodynamically stable.  Lying in the bed.  Complains of pain on the right hip limiting mobility. ? ?Objective: ?Vitals:  ? 04/17/22 1609 04/17/22 2104 04/18/22 0448 04/18/22 0917  ?BP: 118/66 (!) 92/56 115/62 (!) 109/54  ?Pulse: (!) 110 68 63 62  ?Resp: _0 ?Temp: 98.2 ?F (36.8 ?C) 98.2 ?F (36.8 ?C) 98.1 ?F (36.7 ?C) 98.4 ?F (36.9 ?C)  ?TempSrc: Oral Oral Oral Oral  ?SpO2: 96% 96% 95% 97%  ?Weight:      ?Height:      ? ?No intake or output data in the 24 hours ending 04/18/22 1122 ?Filed Weights  ? 04/15/22 2202  ?Weight: 86 kg  ? ? ?Examination: ? ?General exam: Overall comfortable, not in distress ?HEENT: PERRL ?Respiratory system:  no wheezes or crackles  ?  Cardiovascular system: S1 & S2 heard, RRR.  ?Gastrointestinal system: Abdomen is nondistended, soft and nontender. ?Central nervous system: Alert and oriented ?Extremities: No edema, no clubbing ,no cyanosis ?Skin: No rashes, no ulcers,no icterus   ? ? ?Data Reviewed: I have personally reviewed following labs and imaging studies ? ?CBC: ?Recent Labs  ?Lab 04/11/22 ?1232  04/15/22 ?2250 04/16/22 ?0850 04/17/22 ?0128  ?WBC 5.6 5.0 5.6 4.3  ?NEUTROABS 3.4  --   --   --   ?HGB 8.8* 10.4* 10.3* 9.1*  ?HCT 26.7* 31.1* 30.7* 27.2*  ?MCV 105.1* 102.3* 104.1* 103.0*  ?PLT 244 278 249 243  ? ?Basic Metabolic Panel: ?Recent Labs  ?Lab 04/11/22 ?1232 04/15/22 ?2250 04/16/22 ?0850 04/17/22 ?0128  ?NA 142 141 139 141  ?K 3.7 4.7 4.2 4.8  ?CL 119* 110 109 111  ?CO2 _0 ?GLUCOSE 89 99 120* 95  ?BUN _1 ?CREATININE 1.02 1.42* 1.44* 1.38*  ?CALCIUM 7.0* 9.2 8.9 8.8*  ?MG  --   --  1.9 1.8  ?PHOS  --   --  2.8 3.0  ? ? ? ?No results found for this or any previous visit (from the past 240 hour(s)).  ? ?Radiology Studies: ?No results found. ? ?Scheduled Meds: ? acetaminophen  650 mg Oral Q6H  ? apixaban  5 mg Oral BID  ? lidocaine  1 patch Transdermal Q24H  ? senna  1 tablet Oral Daily  ? ?Continuous Infusions: ? ? LOS: 1 day  ? ?Shelly Coss, MD ?[facility] ?P5/07/2022, 11:22 AM   ?

## 2022-04-18 NOTE — Progress Notes (Signed)
Patient requested IV medication due to his ambulation to bathroom and 9/10 pain scale ?

## 2022-04-19 DIAGNOSIS — R52 Pain, unspecified: Secondary | ICD-10-CM | POA: Diagnosis not present

## 2022-04-19 MED ORDER — SODIUM CHLORIDE 0.9 % IV SOLN
12.5000 mg | Freq: Four times a day (QID) | INTRAVENOUS | Status: DC | PRN
  Administered 2022-04-19: 12.5 mg via INTRAVENOUS
  Filled 2022-04-19 (×2): qty 0.5

## 2022-04-19 NOTE — Progress Notes (Signed)
?PROGRESS NOTE ? ?Todd Salazar  KGM:[id number] DOB: 1936/01/20 DOA: 04/15/2022 ?PCP: [name], DO  ? ?Brief Narrative: ? ?Patient is a 86 year old male with history of bradycardia status post pacemaker, A-fib on Eliquis, CKD stage IIIa, bilateral total hip arthroplasty, prostate cancer who presented to the emergency department with complaint of right hip pain, isolated nausea and vomiting.  He recently had previous in the emergency department for the same problem.  Work-up done with labs, CT abdomen/pelvis, MRI of right hip and lumbar spine essentially negative.  Orthopedics were consulted and following and there is no indication for surgical management .hospital course remarkable for significant pain, inability to stand independently, need of IV medications.  Neurosurgery also consulted without any further recommendation on need of surgery.  Family requested for second opinion from orthopedics.  Dr. Yetta Numbers seeing him today.PT/OT recommending [facility] on DC ? ?Assessment & Plan: ? ?Principal Problem: ?  Intractable pain ?Active Problems: ?  Acute right hip pain ?  Persistent atrial fibrillation ([place]) ?  Nausea & vomiting ?  CKD-3A ?  Normocytic anemia ? ? ?Acute right hip pain: History of right THA on 06/2017, history of remote left THA.  Presented with severe right hip pain.  Work-up was negative.  MRI lumbar spine on 5/1 did not show any high-grade spinal stenosis.  MRI right hip showed previous bilateral THA but no significant joint effusion or osteolysis, no fracture or dislocation .  CT abdomen status post without significant finding.  ESR, CRP normal.  Pain is localized in the right hip, no radiation, no radiculopathy, no numbness and tingling, no bladder or bowel incontinence.  Orthopedics said there is no indication for surgical intervention.  Hospital course remarkable for frequent IV pain medications, problem  with weightbearing, decreased range of motion on the right hip.   ?We  requested Dr. Ronnald Ramp to see  him as per recommendation from orthopedics.  He also could not find anything to treat for now.  He recommended outpatient follow-up with Dr. Davy Pique at pain management clinic. ?After discussion with family today, they requested for second opinion from orthopedics.  Dr. Yetta Numbers seeing him later today.  ?Continue supportive care, pain management.  PT initially  skilled nursing facility on  discharge but have changed the recommendation for home health.   ? ?Normocytic anemia: Likely associated with chronic disease, hemoglobin currently stable in the range of 8-10.  Continue monitoring ? ?CKD stage IIIa: Currently kidney function at baseline ? ?Nausea/vomiting: Resolved.  CT abdomen/pelvis without any acute findings. ? ?Persistent atrial fibrillation: Currently rate is controlled without any medication.  On Eliquis for anticoagulation ? ? ?  ?  ? ?DVT prophylaxis: ?apixaban (ELIQUIS) tablet 5 mg  ? ?  Code Status: Full Code ? ?Family Communication: Called and discussed with son, daughter-in-law on phone ? ?Patient status:Inpatient ? ?Patient is from :Home ? ?Anticipated discharge to: Home health ? ?Estimated DC date: Still complaining of right hip pain, awaiting orthopedics reevaluation ? ?Consultants: Orthopedics, neurosurgery ? ?Procedures:None ? ?Antimicrobials:  ?Anti-infectives (From admission, onward)  ? ? None  ? ?  ? ? ?Subjective: ?Patient seen and examined at the bedside this morning.  Hemodynamically stable.  On evaluation, he looks comfortable.  He still complains of right hip pain.  No other associated complaints ? ?Objective: ?Vitals:  ? 04/18/22 1618 04/18/22 2012 04/19/22 0429 04/19/22 0759  ?BP: (!) 111/57 112/63 98/62 (!) 99/57  ?Pulse: 65 71 61 (!) 59  ?Resp: '17 16 17 18  ' ?Temp: 99.4 ?  F (37.4 ?C) 98.9 ?F (37.2 ?C) 98.4 ?F (36.9 ?C) 98.1 ?F (36.7 ?C)  ?TempSrc: Oral Oral Oral Oral  ?SpO2: 98% 97% 97% 97%  ?Weight:      ?Height:      ? ? ?Intake/Output Summary (Last 24 hours) at 04/19/2022 1115 ?Last  data filed at 04/19/2022 0223 ?Gross per 24 hour  ?Intake 180 ml  ?Output 650 ml  ?Net -470 ml  ? ?Filed Weights  ? 04/15/22 2202  ?Weight: 86 kg  ? ? ?Examination: ? ?General exam: Overall comfortable, not in distress ?HEENT: PERRL ?Respiratory system:  no wheezes or crackles  ?Cardiovascular system: S1 & S2 heard, RRR.  ?Gastrointestinal system: Abdomen is nondistended, soft and nontender. ?Central nervous system: Alert and oriented ?Extremities: No edema, no clubbing ,no cyanosis ?Skin: No rashes, no ulcers,no icterus    ? ? ?Data Reviewed: I have personally reviewed following labs and imaging studies ? ?CBC: ?Recent Labs  ?Lab 04/15/22 ?2250 04/16/22 ?0850 04/17/22 ?0128  ?WBC 5.0 5.6 4.3  ?HGB 10.4* 10.3* 9.1*  ?HCT 31.1* 30.7* 27.2*  ?MCV 102.3* 104.1* 103.0*  ?PLT 278 249 243  ? ?Basic Metabolic Panel: ?Recent Labs  ?Lab 04/15/22 ?2250 04/16/22 ?0850 04/17/22 ?0128  ?NA 141 139 141  ?K 4.7 4.2 4.8  ?CL 110 109 111  ?CO2 '24 25 25  ' ?GLUCOSE 99 120* 95  ?BUN '17 18 19  ' ?CREATININE 1.42* 1.44* 1.38*  ?CALCIUM 9.2 8.9 8.8*  ?MG  --  1.9 1.8  ?PHOS  --  2.8 3.0  ? ? ? ?No results found for this or any previous visit (from the past 240 hour(s)).  ? ?Radiology Studies: ?No results found. ? ?Scheduled Meds: ? acetaminophen  650 mg Oral Q6H  ? apixaban  5 mg Oral BID  ? lidocaine  1 patch Transdermal Q24H  ? senna  1 tablet Oral Daily  ? ?Continuous Infusions: ? ? LOS: 2 days  ? ?Shelly Coss, MD ?[facility] ?P5/08/2022, 11:15 AM   ?

## 2022-04-19 NOTE — TOC Initial Note (Signed)
Transition of Care (TOC) - Initial/Assessment Note  ? ? ?Patient Details  ?Name: Todd Salazar ?MRN: [id number] ?Date of Birth: 14-Jun-1936 ? ?Transition of Care (TOC) CM/SW Contact:    ?Marilu Favre, RN ?Phone Number: ?04/19/2022, 11:30 AM ? ?Clinical Narrative:                 ?Spoke to PT , updated recommendations to HHPT.  ? ? ?Spoke to patient he is aware and stated he does not want to go to SNF , he prefers home.  ? ? ?Patient consented for NCM call family. NCM asked which contact and he stated Ronalee Belts. NCM called Ronalee Belts from patient room and placed Ronalee Belts on speaker phone with consent.  ? ?Ronalee Belts has concerns about his dad being discharged and not being able to tolerate pain medicine by mouth . NCM explained MD is not discharging his dad today and working on pain control.  ? ?Discussed PT recommendation for HHPT/OT. Also explained patient's insurance has a custodial care plan for  aides. Patient would need to be approved by his insurance for aides, if approved insurance would determined how many hours patient would receive.  ? ?Patient and Ronalee Belts in agreement.  ? ?NCM made referral for HHPT,OT, and SW to [facility] and referral was accepted.  ? ?NCM spoke to April at [facility]. April accepted referral for aides and will submit for insurance authorization. April will call NCM and family ( she has Mike's and Malinda's cell numbers) with insurances determination. ? ?Ronalee Belts and patient voiced understanding to above.  ? ?Ronalee Belts again concerned about his dad's ability to tolerate medication to control pain, and would like continued updates from MD. MD aware   ? ?Expected Discharge Plan: [place] ?Barriers to Discharge: Continued Medical Work up ? ? ?Patient Goals and CMS Choice ?Patient states their goals for this hospitalization and ongoing recovery are:: to return to home ?CMS Medicare.gov Compare Post Acute Care list provided to:: Patient ?Choice offered to / list presented to : Patient, Adult  Children ? ?Expected Discharge Plan and Services ?Expected Discharge Plan: [place] ?  ?Discharge Planning Services: CM Consult ?Post Acute Care Choice: Home Health ?Living arrangements for the past 2 months: [place] ?                ?DME Arranged: N/A ?DME Agency: NA ?  ?  ?  ?HH Arranged: PT, OT, Social Work ?[place] Agency: [facility] ?Date HH Agency Contacted: 04/19/22 ?Time [place]: 1129 ?Representative spoke with at [place]: Tommi Rumps ? ?Prior Living Arrangements/Services ?Living arrangements for the past 2 months: [place] ?Lives with:: Self ?Patient language and need for interpreter reviewed:: Yes ?Do you feel safe going back to the place where you live?: Yes      ?Need for Family Participation in Patient Care: Yes (Comment) ?Care giver support system in place?: Yes (comment) ?Current home services: DME ?Criminal Activity/Legal Involvement Pertinent to Current Situation/Hospitalization: No - Comment as needed ? ?Activities of Daily Living ?  ?  ? ?Permission Sought/Granted ?  ?Permission granted to share information with : Yes, Verbal Permission Granted ? Share Information with NAME: son Ronalee Belts ?   ?   ?   ? ?Emotional Assessment ?Appearance:: Appears stated age ?Attitude/Demeanor/Rapport: Engaged ?Affect (typically observed): Accepting ?Orientation: : Oriented to Self, Oriented to Place, Oriented to  Time, Oriented to Situation ?Alcohol / Substance Use: Not Applicable ?Psych Involvement: No (  comment) ? ?Admission diagnosis:  Intractable pain [R52] ?Acute right hip pain [M25.551] ?Patient Active Problem List  ? Diagnosis Date Noted  ? Intractable pain 04/16/2022  ? Nausea & vomiting 04/16/2022  ? CKD-3A 04/16/2022  ? Acute right hip pain 04/16/2022  ? Normocytic anemia 04/16/2022  ? Secondary hypercoagulable state ([place]) 08/27/2021  ? Persistent atrial fibrillation ([place]) 08/17/2021  ? S/P lumbar fusion 11/25/2019  ? Carcinoma of prostate ([place]) 09/07/2019   ? Lumbar radiculopathy 09/07/2019  ? Degeneration of lumbar intervertebral disc 09/07/2019  ? History of total replacement of both hip joints 08/01/2019  ? Iliotibial band syndrome of right side 07/27/2019  ? Macrocytic anemia 07/26/2019  ? Hypotension 07/26/2019  ? Bronchiectasis without complication ([place]) 63/84/6659  ? DOE (dyspnea on exertion) 10/19/2018  ? Lung nodule < 6cm on CT 10/09/2018  ? Occipital neuritis 08/29/2016  ? Bilateral hearing loss 06/22/2016  ? Post herpetic neuralgia 02/02/2016  ? Current use of long term anticoagulation 12/18/2015  ? Calcification of spleen 09/13/2014  ? Diverticulosis 09/13/2014  ? Bilateral renal cysts 09/13/2014  ? 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 Hemangioma of liver 09/13/2014  ? Male erectile dysfunction, unspecified 09/13/2014  ? 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 Paraphimosis 09/13/2014  ? Polyp of colon 09/13/2014  ? Restless legs syndrome 09/13/2014  ? Spondylosis without myelopathy or radiculopathy, cervical region 09/13/2014  ? Urethral stricture 09/13/2014  ? Spondylosis without myelopathy or radiculopathy, lumbosacral region 09/13/2014  ? Anxiety 09/13/2014  ? Chest pain at rest 12/11/2011  ?  Class: Acute  ? CAD (coronary artery disease), native coronary artery 12/11/2011  ?  Class: Chronic  ? HTN (hypertension) 12/11/2011  ? Paroxysmal atrial fibrillation ([place]) 12/11/2011  ? ?PCP:  [name], DO ?Pharmacy:   ?[facility] [place] - [address] ?[place] ?[place] [address] ?Phone: [phone] Fax: [phone] ? ?[id number] [place], [place] - [address]?Phone: [phone]: [phone]? ?[id number] [place], [place] - [address] ?[address]Phone: [phone]: [phone] ? ? ? ?Social Determinants of Health (SDOH) Interventions ?  ? ?Readmission  Risk Interventions ?   ? View : No data to display.  ?  ?  ?  ? ? ? ?

## 2022-04-19 NOTE — Consult Note (Signed)
ORTHOPAEDIC CONSULTATION ? ?REQUESTING PHYSICIAN: Shelly Coss, MD ? ?Chief Complaint: Right lateral hip pain ? ?HPI: ?Todd Salazar is a 86 y.o. male who had undergone uncomplicated right total hip arthroplasty the with the retired Psychologist, sport and exercise in [employer] approximately 5 years ago.  He reports no complications postoperatively.  He notes about 2 weeks ago had an acute atraumatic right lateral hip pain with inability to bear weight.  Given severity of the pain which she was unable to manage and ambulate he was hospitalized for the pain.  Initial work-up and evaluation has been negative for intra-articular hip joint pathology.  He denies any recent illnesses or infections.  He has had a prior lumbar spine surgery with Dr. Ronnald Ramp.  He was also scheduled to undergo a spinal cord stimulator placement later this month. ? ?Past Medical History:  ?Diagnosis Date  ? Arthritis   ? Atrial fibrillation ([place])   ? Cancer [facility])   ? prostate  ? Dysrhythmia   ? afib  ? History of kidney stones   ? ?Past Surgical History:  ?Procedure Laterality Date  ? ATRIAL FIBRILLATION ABLATION N/A 10/29/2021  ? Procedure: ATRIAL FIBRILLATION ABLATION;  Surgeon: Constance Haw, MD;  Location: [facility];  Service: Cardiovascular;  Laterality: N/A;  ? Bone spur    ? CHOLECYSTECTOMY    ? HERNIA REPAIR    ? JOINT REPLACEMENT    ? kidney stones    ? LAMINECTOMY WITH POSTERIOR LATERAL ARTHRODESIS LEVEL 1 Right 11/25/2019  ? Procedure: Laminectomy and Foraminotomy  Lumbar Four-Lumbar Five - right, with instrumented fusion;  Surgeon: Eustace Moore, MD;  Location: [place];  Service: Neurosurgery;  Laterality: Right;  Laminectomy and Foraminotomy  Lumbar Four-Lumbar Five - right, with instrumented fusion  ? PROSTATE SURGERY    ? ?Social History  ? ?Socioeconomic History  ? Marital status: Widowed  ?  Spouse name: Not on file  ? Number of children: Not on file  ? Years of education: Not on file  ? Highest education level: Not on file   ?Occupational History  ? Not on file  ?Tobacco Use  ? Smoking status: Former  ?  Types: Cigarettes  ?  Quit date: 12/10/1996  ?  Years since quitting: 25.3  ? Smokeless tobacco: Never  ? Tobacco comments:  ?  Former smoker (08/27/2021)  ?Vaping Use  ? Vaping Use: Never used  ?Substance and Sexual Activity  ? Alcohol use: No  ? Drug use: No  ? Sexual activity: Never  ?Other Topics Concern  ? Not on file  ?Social History Narrative  ? Not on file  ? ?Social Determinants of Health  ? ?Financial Resource Strain: Not on file  ?Food Insecurity: Not on file  ?Transportation Needs: Not on file  ?Physical Activity: Not on file  ?Stress: Not on file  ?Social Connections: Not on file  ? ?Family History  ?Problem Relation Age of Onset  ? Hypertension Father   ? CAD Neg Hx   ? Heart failure Neg Hx   ? Stroke Neg Hx   ? ?Allergies  ?Allergen Reactions  ? Ciprofloxacin Anaphylaxis  ?  Dizziness ?  ? Tramadol Swelling  ?  Laryngeal edema  ? Duloxetine Other (See Comments)  ?  delusions  ? Hydrocodone-Acetaminophen Nausea And Vomiting  ? Benadryl [Diphenhydramine] Other (See Comments)  ?  Panic attack   ? Nisoldipine Other (See Comments)  ?  Pt unsure of reaction   ? Methocarbamol Nausea Only  ?  Ondansetron Hcl Nausea Only  ? Sulfa Antibiotics Rash  ? ? ? ?Positive ROS: All other systems have been reviewed and were otherwise negative with the exception of those mentioned in the HPI and as above. ? ?Physical Exam: ?General: Alert, no acute distress ?Cardiovascular: No pedal edema ?Respiratory: No cyanosis, no use of accessory musculature ?Skin: No lesions in the area of chief complaint ?Neurologic: Sensation intact distally ?Psychiatric: Patient is competent for consent with normal mood and affect ? ?MUSCULOSKELETAL:  ?RLE well-healed posterior lateral hip incision.  No traumatic wounds, ecchymosis, or rash ? Focal tenderness to the abductors just proximal to the greater trochanter ? No groin pain with log roll ? Reproduce abductor  pain with hip flexion internal rotation as well as resisted abduction and abduction. ? No knee or ankle effusion ? Sens DPN, SPN, TN intact ? Motor EHL, ext, flex 5/5 ? DP 2+, PT 2+, No significant edema ? ? ?IMAGING: X-rays, MRI and, CT scan were reviewed demonstrate total hip arthroplasty in good anatomic position without adverse features well reduced no evidence of hardware loosening no fracture.  MRI limited by implant artifact does not show a large hip effusion, pseudotumor, or abductor tears or necrosis ? ?Assessment: ?Principal Problem: ?  Intractable pain ?Active Problems: ?  Persistent atrial fibrillation ([place]) ?  Nausea & vomiting ?  CKD-3A ?  Acute right hip pain ?  Normocytic anemia ? ?Right hip pain likely myofascial from the right hip abductors, this likely related to the spine or hip replacement itself ? ?Plan: ?Had a good conversation with the patient and the son based on imaging and patient's symptoms primarily appear to be localized to the right hip abductors.  MRI although limited by metal artifact does not show a large abductor tear, pseudotumor or other deficiency.  Given the severity of pain, elevated ESR, and inability to weight-bear or ambulate would be reasonable to obtain an aspiration of the right hip joint to rule out septic arthritis.  R hip joint aspiration ordered. 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 ?Also discussed possibility of a epidural injection as recommended by Dr. Ronnald Ramp see if this improves symptoms. ?Otherwise, patient can be seen in clinic by our nonoperative providers for consideration of an ultrasound-guided right hip abductor injection. ? ? ? ?Willaim Sheng, MD ? ?Contact information:   ?Weekdays 7am-5pm epic message Dr. Zachery Dakins, or call office for patient follow up: [phone] ?After hours and holidays please check [email or web address] for group call information  for [facility]  ?

## 2022-04-19 NOTE — Progress Notes (Signed)
Physical Therapy Treatment ?Patient Details ?Name: Todd Salazar ?MRN: [id number] ?DOB: 11-04-1936 ?Today's Date: 04/19/2022 ? ? ?History of Present Illness Pt is a 86 y.o. male presenting to ED for third time on 04/15/2022 with right hip pain. MRI right hip showing no large hip joint effusion, osteolysis, or pseudotumor. CTA showing no acute findings in abdomen or pelvis. Of note, hip xray from 04/04/21 showed no acute findings. Per neurosurgery consult, pain does not seem indicative of spine disease or radiculopathy. Significant PMH includes afib, bilateral THA, laminectomy, prostate surgery. ?  ?PT Comments  ? ? Pt progressing with mobility. Pt ambulating in room with RW at supervision-level; distance and activity tolerance limited by reports of persistent RLE pain (pt now points to R-side PSIS area as primary pain site, as opposed to R anterior hip on initial PT Eval). Pt declines mobility beyond to/from bathroom secondary to pain. Pt declines SNF for rehab ("I can't do physical therapy every day like this"); reports owning necessary DME (including RW, wheelchair), family nearby who can assist PRN with ADL/iADLs. Will continue to follow acutely to address established goals. ?   ?Recommendations for follow up therapy are one component of a multi-disciplinary discharge planning process, led by the attending physician.  Recommendations may be updated based on patient status, additional functional criteria and insurance authorization. ? ?Follow Up Recommendations ? Home health PT [facility] aide) ?  ?  ?Assistance Recommended at Discharge Intermittent Supervision/Assistance  ?Patient can return home with the following A little help with bathing/dressing/bathroom;Assistance with cooking/housework;Assist for transportation;Help with stairs or ramp for entrance ?  ?Equipment Recommendations ? BSC/3in1  ?  ?Recommendations for Other Services   ? ? ?  ?Precautions / Restrictions Precautions ?Precautions: Fall ?Restrictions ?Weight  Bearing Restrictions: No  ?  ? ?Mobility ? Bed Mobility ?Overal bed mobility: Modified Independent ?Bed Mobility: Supine to Sit, Sit to Supine ?  ?  ?Supine to sit: Modified independent (Device/Increase time) ?Sit to supine: Modified independent (Device/Increase time) ?  ?General bed mobility comments: mod indep for increased time with supine<>sit from flat bed; pt reports easier to get up on R-side; limited by RLE pain ?  ? ?Transfers ?Overall transfer level: Needs assistance ?Equipment used: Rolling walker (2 wheels) ?Transfers: Sit to/from Stand ?Sit to Stand: Supervision ?  ?  ?  ?  ?  ?  ?  ? ?Ambulation/Gait ?Ambulation/Gait assistance: Supervision ?Gait Distance (Feet): 24 Feet ?Assistive device: Rolling walker (2 wheels) ?Gait Pattern/deviations: Step-through pattern, Decreased stride length, Decreased weight shift to right, Antalgic, Trunk flexed ?Gait velocity: Decreased ?  ?  ?General Gait Details: Slow, antalgic gait with RW and supervision for safety; pt walking into bathroom to perform ADLs, then reports having to walk back to bed to sit down due to pain, declines any further mobility secondary to pain ? ? ?Stairs ?  ?  ?  ?  ?  ? ? ?Wheelchair Mobility ?  ? ?Modified Rankin (Stroke Patients Only) ?  ? ? ?  ?Balance Overall balance assessment: Needs assistance ?Sitting-balance support: No upper extremity supported, Feet supported ?Sitting balance-Leahy Scale: Fair ?  ?  ?Standing balance support: No upper extremity supported, During functional activity, Bilateral upper extremity supported ?Standing balance-Leahy Scale: Fair ?Standing balance comment: can static stand without UE support, preference for RW ?  ?  ?  ?  ?  ?  ?  ?  ?  ?  ?  ?  ? ?  ?Cognition Arousal/Alertness: Awake/alert ?Behavior During Therapy: [facility]  for tasks assessed/performed ?Overall Cognitive Status: Within Functional Limits for tasks assessed ?  ?  ?  ?  ?  ?  ?  ?  ?  ?  ?  ?  ?  ?  ?  ?  ?General Comments: pt voices frustration  with current situation ?  ?  ? ?  ?Exercises   ? ?  ?General Comments General comments (skin integrity, edema, etc.): increased time discussing safe d/c plan - pt with limited ability to participate in mobility beyond household distances due to pain (declines attempts at LE therex/ROM beyond walking to/from bathroom); pt in agreement that SNF-level PT may not serve him best; pt reports family able to provide PRN assist for ADL/iADLs; pt owns necessary DME, including RW ?  ?  ? ?Pertinent Vitals/Pain Pain Assessment ?Pain Assessment: Faces ?Faces Pain Scale: Hurts even more ?Pain Location: RLE with mobility; when pt asked to point to pain, points to R-side PSIS area ?Pain Descriptors / Indicators: Grimacing, Sharp ?Pain Intervention(s): Limited activity within patient's tolerance, Monitored during session, Other (comment) (pt reports only alleviating factor is laying flat; "I'm too nauseous to keep pain meds down")  ? ? ?Home Living   ?  ?  ?  ?  ?  ?  ?  ?  ?  ?   ?  ?Prior Function    ?  ?  ?   ? ?PT Goals (current goals can now be found in the care plan section) Progress towards PT goals: Progressing toward goals ? ?  ?Frequency ? ? ? Min 3X/week ? ? ? ?  ?PT Plan Current plan remains appropriate  ? ? ?Co-evaluation   ?  ?  ?  ?  ? ?  ?AM-PAC PT "6 Clicks" Mobility   ?Outcome Measure ? Help needed turning from your back to your side while in a flat bed without using bedrails?: None ?Help needed moving from lying on your back to sitting on the side of a flat bed without using bedrails?: None ?Help needed moving to and from a bed to a chair (including a wheelchair)?: A Little ?Help needed standing up from a chair using your arms (e.g., wheelchair or bedside chair)?: A Little ?Help needed to walk in hospital room?: A Little ?Help needed climbing 3-5 steps with a railing? : A Lot ?6 Click Score: 19 ? ?  ?End of Session Equipment Utilized During Treatment: Gait belt ?Activity Tolerance: Patient limited by pain ?Patient  left: in bed;with call bell/phone within reach;with bed alarm set ?Nurse Communication: Mobility status ?PT Visit Diagnosis: Other abnormalities of gait and mobility (R26.89);Pain ?  ? ? ?Time: 3903-0092 ?PT Time Calculation (min) (ACUTE ONLY): 16 min ? ?Charges:  $Self Care/Home Management: 8-22          ?          ? ?Mabeline Caras, PT, DPT ?Acute Rehabilitation Services  ?Pager [phone] ?Office [phone] ? ?Todd Salazar ?04/19/2022, 12:51 PM ? ?

## 2022-04-20 DIAGNOSIS — R52 Pain, unspecified: Secondary | ICD-10-CM | POA: Diagnosis not present

## 2022-04-20 NOTE — Progress Notes (Signed)
Mobility Specialist Progress Note: ? ? 04/20/22 1217  ?Mobility  ?Activity Ambulated with assistance in hallway  ?Level of Assistance Contact guard assist, steadying assist  ?Assistive Device Front wheel walker  ?Distance Ambulated (ft) 50 ft  ?Activity Response Tolerated well  ?$Mobility charge 1 Mobility  ? ?Pt received in bed willing to participate in mobility. Complaints of 10/10 hip pain. Left in bed with call bell in reach and all needs met.  ? ?Murielle Stang ?Mobility Specialist ?Primary Phone [phone] ? ?

## 2022-04-20 NOTE — Progress Notes (Signed)
Physical Therapy Treatment ?Patient Details ?Name: Todd Salazar Born ?MRN: [id number] ?DOB: 1936/06/21 ?Today's Date: 04/20/2022 ? ? ?History of Present Illness Pt is a 86 y.o. male presenting to ED for third time on 04/15/2022 with right hip pain. MRI right hip showing no large hip joint effusion, osteolysis, or pseudotumor. CTA showing no acute findings in abdomen or pelvis. Of note, hip xray from 04/04/21 showed no acute findings. Per neurosurgery consult, pain does not seem indicative of spine disease or radiculopathy. Plan for R hip joint aspiration to rule out septic arthritis 5/11. Significant PMH includes afib, bilateral THA, laminectomy, prostate surgery. ?  ?PT Comments  ? ? Pt progressing with mobility. Today's session focused on transfer and gait training; pt able to ambulate ~70' with RW and supervision. Pt reports R hip "feels like it's catching" intermittently with weight bearing; pt with persistent pain, but ultimately able to increase functional activity today. Will continue to follow acutely to address established goals. ?   ?Recommendations for follow up therapy are one component of a multi-disciplinary discharge planning process, led by the attending physician.  Recommendations may be updated based on patient status, additional functional criteria and insurance authorization. ? ?Follow Up Recommendations ? Home health PT ?  ?  ?Assistance Recommended at Discharge Intermittent Supervision/Assistance  ?Patient can return home with the following A little help with bathing/dressing/bathroom;Assistance with cooking/housework;Assist for transportation;Help with stairs or ramp for entrance ?  ?Equipment Recommendations ? BSC/3in1  ?  ?Recommendations for Other Services   ? ? ?  ?Precautions / Restrictions Precautions ?Precautions: Fall ?Restrictions ?Weight Bearing Restrictions: No  ?  ? ?Mobility ? Bed Mobility ?Overal bed mobility: Modified Independent ?Bed Mobility: Supine to Sit, Sit to Supine ?  ?  ?  ?  ?   ?General bed mobility comments: bed flat, increased time ?  ? ?Transfers ?Overall transfer level: Needs assistance ?Equipment used: Rolling walker (2 wheels) ?Transfers: Sit to/from Stand ?Sit to Stand: Supervision ?  ?  ?  ?  ?  ?General transfer comment: able to stand from EOB and low toilet height to RW, use of momentum to power up, supervision for safety ?  ? ?Ambulation/Gait ?Ambulation/Gait assistance: Supervision ?Gait Distance (Feet): 70 Feet ?Assistive device: Rolling walker (2 wheels) ?Gait Pattern/deviations: Step-through pattern, Decreased stride length, Decreased weight shift to right, Antalgic, Trunk flexed ?Gait velocity: Decreased ?  ?  ?General Gait Details: Slow, antalgic gait with RW and supervision for safety; pt with intermittent moaning of pain, reports it feels like R hip "catches"'; further distance limited by pain ? ? ?Stairs ?  ?  ?  ?  ?  ? ? ?Wheelchair Mobility ?  ? ?Modified Rankin (Stroke Patients Only) ?  ? ? ?  ?Balance Overall balance assessment: Needs assistance ?Sitting-balance support: No upper extremity supported, Feet supported ?Sitting balance-Leahy Scale: Good ?  ?  ?Standing balance support: No upper extremity supported, During functional activity, Bilateral upper extremity supported ?Standing balance-Leahy Scale: Fair ?Standing balance comment: can static stand at sink to perform ADL tasks without UE support ?  ?  ?  ?  ?  ?  ?  ?  ?  ?  ?  ?  ? ?  ?Cognition Arousal/Alertness: Awake/alert ?Behavior During Therapy: [facility] for tasks assessed/performed ?Overall Cognitive Status: Within Functional Limits for tasks assessed ?  ?  ?  ?  ?  ?  ?  ?  ?  ?  ?  ?  ?  ?  ?  ?  ?  ?  ?  ? ?  ?  Exercises   ? ?  ?General Comments   ?  ?  ? ?Pertinent Vitals/Pain Pain Assessment ?Pain Assessment: Faces ?Faces Pain Scale: Hurts even more ?Pain Location: RLE ?Pain Descriptors / Indicators: Grimacing, Sharp ?Pain Intervention(s): Monitored during session, Limited activity within patient's  tolerance  ? ? ?Home Living   ?  ?  ?  ?  ?  ?  ?  ?  ?  ?   ?  ?Prior Function    ?  ?  ?   ? ?PT Goals (current goals can now be found in the care plan section) Progress towards PT goals: Progressing toward goals ? ?  ?Frequency ? ? ? Min 3X/week ? ? ? ?  ?PT Plan Current plan remains appropriate  ? ? ?Co-evaluation   ?  ?  ?  ?  ? ?  ?AM-PAC PT "6 Clicks" Mobility   ?Outcome Measure ? Help needed turning from your back to your side while in a flat bed without using bedrails?: None ?Help needed moving from lying on your back to sitting on the side of a flat bed without using bedrails?: None ?Help needed moving to and from a bed to a chair (including a wheelchair)?: A Little ?Help needed standing up from a chair using your arms (e.g., wheelchair or bedside chair)?: A Little ?Help needed to walk in hospital room?: A Little ?Help needed climbing 3-5 steps with a railing? : A Little ?6 Click Score: 20 ? ?  ?End of Session Equipment Utilized During Treatment: Gait belt ?Activity Tolerance: Patient tolerated treatment well;Patient limited by pain ?Patient left: in bed;with call bell/phone within reach ?Nurse Communication: Mobility status ?PT Visit Diagnosis: Other abnormalities of gait and mobility (R26.89);Pain ?Pain - Right/Left: Right ?Pain - part of body: Hip ?  ? ? ?Time: 1157-2620 ?PT Time Calculation (min) (ACUTE ONLY): 25 min ? ?Charges:  $Gait Training: 8-22 mins ?$Therapeutic Activity: 8-22 mins          ?          ? ?Mabeline Caras, PT, DPT ?Acute Rehabilitation Services  ?Pager [phone] ?Office [phone] ? ?Derry Lory ?04/20/2022, 5:53 PM ? ?

## 2022-04-20 NOTE — Progress Notes (Addendum)
?PROGRESS NOTE ? ? ? ?Todd Salazar  CHE:[id number] DOB: 12/20/1935 DOA: 04/15/2022 ?PCP: [name], DO  ? ? ?Brief Narrative:  ? ?Todd Salazar is an 86 year old male with past medical history significant for bradycardia s/p PPM, paroxysmal atrial fibrillation on Eliquis, CKD stage IIIa, osteoarthritis s/p bilateral total hip arthroplasty, prostate cancer who presented to [facility] ED on 5/5 with complaints of right hip pain and nausea/vomiting.  Recently seen in the ED for same problem with work-up unrevealing in terms of labs, CT abdomen/pelvis, MR right hip and lumbar spine.  Patient has been seen by 2 different orthopedic physicians and neurosurgery without surgical intervention needed at this time.  Patient is pending right hip joint aspiration per recommendation of Dr. Yetta Numbers to rule out joint infection.  Seen by PT/OT and recommending home health on discharge. ? ?Assessment & Plan: ?  ?Right hip pain ?Hx OA s/p bilateral THA ?Patient with history of right THA 06/2017 and remote left THA who presented to the ED with severe right hip pain.  No radiation of pain, no radiculopathy, no paresthesias or bowel/bladder incontinence.  MR L-spine 5/1 did not show any high-grade spinal stenosis.  MR right hip with previous bilateral THA but no significant joint effusion or osteolysis, no fracture or dislocation.  ESR slightly elevated at 20 and CRP within normal limits at 0.7.  Afebrile without leukocytosis.  Was evaluated by orthopedics, Dr. Doran Durand on 5/7 with recommendation of evaluation by neurosurgery, Dr. Ronnald Ramp; with eventual follow-up with Dr. Alvan Dame in outpatient clinic 2-3 weeks following discharge.  Was evaluated by Dr. Ronnald Ramp on 5/8, and no concerns from a neurosurgical standpoint with recommendations of follow-up in their pain management center with Dr. Davy Pique on discharge.  Patient and family requesting second orthopedic opinion; was evaluated by Dr. Zachery Dakins on 5/9, and recommended aspiration of right hip  joint to rule out septic arthritis given inability to bear weight/ambulate.  If right joint aspiration negative, recommended outpatient follow-up with nonoperative provider for consideration of an ultrasound-guided right hip abductor injection. ?--Lidocaine patch ?--Oxycodone 5 mg PO q6h PRN moderate pain ?--Dilaudid 0.5 mg IV q4hPRN severe pain ?--Zanaflex 2 mg p.o. q6h PRN muscle spasms ?--Pending right hip joint aspiration, fluid for cell count and culture ?--Continue PT/OT efforts while inpatient, home health on discharge ? ?Nausea/vomiting: Resolved ?CT abdomen/pelvis without acute findings. ? ?Paroxysmal atrial fibrillation ?Hx symptomatic bradycardia s/p PPM ?--Holding home Eliquis for joint aspiration as above ? ?CKD stage IIIa ?Creatinine 1.42>1.44>1.3, stable. ?--Avoid nephrotoxins, renal dose all medications ? ?History of prostate cancer ?Continue outpatient follow-up with urology at [facility] ? ?DVT prophylaxis:  ?apixaban (ELIQUIS) tablet 5 mg  ?  Code Status: Full Code ?Family Communication: No family present at bedside this morning ? ?Disposition Plan:  ?Level of care: Med-Surg ?Status is: Inpatient ?Remains inpatient appropriate because: Pending right hip joint aspiration ?  ? ?Consultants:  ?Orthopedics, Dr. Doran Durand ?Orthopedics, Dr. Zachery Dakins ?Neurosurgery, Dr. Ronnald Ramp ? ?Procedures:  ?None ? ?Antimicrobials:  ?None ? ? ?Subjective: ?Patient seen examined bedside, resting comfortably.  Lying in bed.  No specific questions or complaints at this time.  Awaiting joint aspiration.  Discussed with patient if joint infection ruled out, anticipate discharge home thereafter with home health.  Denies headache, no dizziness, no chest pain, no palpitations, no shortness of breath, no fever/chills/night sweats, no nausea/vomiting/diarrhea, no abdominal pain, no fatigue, no paresthesias.  No acute events overnight per nurse staff. ? ?Objective: ?Vitals:  ? 04/19/22 1541 04/19/22 1942  04/20/22 7062 04/20/22 0835  ?BP: (!) 109/55 130/65 (!) 110/53 95/61  ?Pulse: 64 68 63 70  ?Resp: '18  18 16  ' ?Temp: 98 ?F (36.7 ?C) 98.1 ?F (36.7 ?C) 98.3 ?F (36.8 ?C) (!) 97.5 ?F (36.4 ?C)  ?TempSrc:  Oral Oral Oral  ?SpO2: 99% 100% 95% 97%  ?Weight:      ?Height:      ? ? ?Intake/Output Summary (Last 24 hours) at 04/20/2022 1332 ?Last data filed at 04/20/2022 1000 ?Gross per 24 hour  ?Intake 260 ml  ?Output 500 ml  ?Net -240 ml  ? ?Filed Weights  ? 04/15/22 2202  ?Weight: 86 kg  ? ? ?Examination: ? ?Physical Exam: ?GEN: NAD, alert and oriented x 3, wd/wn ?HEENT: NCAT, PERRL, EOMI, sclera clear, MMM ?PULM: CTAB w/o wheezes/crackles, normal respiratory effort, on room air ?CV: RRR w/o M/G/R ?GI: abd soft, NTND, NABS, no R/G/M ?MSK: no peripheral edema, moves all extremities independently ?NEURO: CN II-XII intact, no focal deficits, sensation to light touch intact ?PSYCH: normal mood/affect ?Integumentary: dry/intact, no rashes or wounds ? ? ? ?Data Reviewed: I have personally reviewed following labs and imaging studies ? ?CBC: ?Recent Labs  ?Lab 04/15/22 ?2250 04/16/22 ?0850 04/17/22 ?0128  ?WBC 5.0 5.6 4.3  ?HGB 10.4* 10.3* 9.1*  ?HCT 31.1* 30.7* 27.2*  ?MCV 102.3* 104.1* 103.0*  ?PLT 278 249 243  ? ?Basic Metabolic Panel: ?Recent Labs  ?Lab 04/15/22 ?2250 04/16/22 ?0850 04/17/22 ?0128  ?NA 141 139 141  ?K 4.7 4.2 4.8  ?CL 110 109 111  ?CO2 '24 25 25  ' ?GLUCOSE 99 120* 95  ?BUN '17 18 19  ' ?CREATININE 1.42* 1.44* 1.38*  ?CALCIUM 9.2 8.9 8.8*  ?MG  --  1.9 1.8  ?PHOS  --  2.8 3.0  ? ?GFR: ?Estimated Creatinine Clearance: 47.6 mL/min (A) (by C-G formula based on SCr of 1.38 mg/dL (H)). ?Liver Function Tests: ?Recent Labs  ?Lab 04/16/22 ?0850 04/17/22 ?0128  ?ALBUMIN 4.1 3.5  ? ?No results for input(s): LIPASE, AMYLASE in the last 168 hours. ?No results for input(s): AMMONIA in the last 168 hours. ?Coagulation Profile: ?No results for input(s): INR, PROTIME in the last 168 hours. ?Cardiac Enzymes: ?No results for input(s):  CKTOTAL, CKMB, CKMBINDEX, TROPONINI in the last 168 hours. ?BNP (last 3 results) ?Recent Labs  ?  01/12/22 ?1144  ?PROBNP 261  ? ?HbA1C: ?No results for input(s): HGBA1C in the last 72 hours. ?CBG: ?No results for input(s): GLUCAP in the last 168 hours. ?Lipid Profile: ?No results for input(s): CHOL, HDL, LDLCALC, TRIG, CHOLHDL, LDLDIRECT in the last 72 hours. ?Thyroid Function Tests: ?No results for input(s): TSH, T4TOTAL, FREET4, T3FREE, THYROIDAB in the last 72 hours. ?Anemia Panel: ?No results for input(s): VITAMINB12, FOLATE, FERRITIN, TIBC, IRON, RETICCTPCT in the last 72 hours. ?Sepsis Labs: ?No results for input(s): PROCALCITON, LATICACIDVEN in the last 168 hours. ? ?No results found for this or any previous visit (from the past 240 hour(s)).  ? ? ? ? ? ?Radiology Studies: ?No results found. ? ? ? ? ? ?Scheduled Meds: ? acetaminophen  650 mg Oral Q6H  ? apixaban  5 mg Oral BID  ? lidocaine  1 patch Transdermal Q24H  ? senna  1 tablet Oral Daily  ? ?Continuous Infusions: ? promethazine (PHENERGAN) injection (IM or IVPB) Stopped (04/19/22 1449)  ? ? ? LOS: 3 days  ? ? ?Time spent: 49 minutes spent on chart review, discussion with nursing staff, consultants, updating family and interview/physical exam; more than  50% of that time was spent in counseling and/or coordination of care. ? ? ? ?Kyeisha Janowicz J [place] DO ?[facility] ?Available via Epic secure chat 7am-7pm ?After these hours, please refer to coverage provider listed on [email or web address] ?04/20/2022, 1:32 PM  ? ?

## 2022-04-20 NOTE — Progress Notes (Signed)
Per IR tech, hip aspiration order placed on 5/9 is incorrect. New order placed for IR R hip needle aspiration by ortho MD. Notified pt that procedure will be done tomorrow. ?

## 2022-04-20 NOTE — Care Management Important Message (Signed)
Important Message ? ?Patient Details  ?Name: Todd Salazar ?MRN: [id number] ?Date of Birth: May 19, 1936 ? ? ?Medicare Important Message Given:  Yes ? ? ? ? ?Aryam Zhan ?04/20/2022, 2:08 PM ?

## 2022-04-20 NOTE — Progress Notes (Addendum)
Occupational Therapy Treatment ?Patient Details ?Name: Todd Salazar ?MRN: [id number] ?DOB: 1936/09/27 ?Today's Date: 04/20/2022 ? ? ?History of present illness Pt is a 86 y.o. male presenting to ED for third time on 04/15/2022 with right hip pain. MRI right hip showing no large hip joint effusion, osteolysis, or pseudotumor. CTA showing no acute findings in abdomen or pelvis. Of note, hip xray from 04/04/21 showed no acute findings. Per neurosurgery consult, pain does not seem indicative of spine disease or radiculopathy. Plan for R hip joint aspiration to rule out septic arthritis 5/10. Significant PMH includes afib, bilateral THA, laminectomy, prostate surgery. ?  ?OT comments ? Patient seated on EOB upon entry and asking to use bathroom. Patient was supervision to stand and ambulate to bathroom and was able to perform toilet hygiene seated and stood at sink for hygiene. Adaptive equipment training for LB dressing with reacher and sock aide. Patient states he uses them at home and performed doffing and donning socks without instructions and supervision. Patient returned to supine due to pain. Acute OT to continue to follow   ? ?Recommendations for follow up therapy are one component of a multi-disciplinary discharge planning process, led by the attending physician.  Recommendations may be updated based on patient status, additional functional criteria and insurance authorization. ?   ?Follow Up Recommendations ? No OT follow up  ?  ?Assistance Recommended at Discharge Frequent or constant Supervision/Assistance  ?Patient can return home with the following ?  A little help with bathing/dressing/bathroom; A little help with transfers ?  ?Equipment Recommendations ? BSC/3in1  ?  ?Recommendations for Other Services   ? ?  ?Precautions / Restrictions Precautions ?Precautions: Fall ?Restrictions ?Weight Bearing Restrictions: No  ? ? ?  ? ?Mobility Bed Mobility ?Overal bed mobility: Modified Independent ?Bed Mobility: Supine  to Sit, Sit to Supine ?  ?  ?  ?  ?  ?General bed mobility comments: seated on EOB upon arrival and able to return to supine at end of session with supervision ?  ? ?Transfers ?Overall transfer level: Needs assistance ?Equipment used: Rolling walker (2 wheels) ?Transfers: Sit to/from Stand ?Sit to Stand: Supervision ?  ?  ?  ?  ?  ?General transfer comment: able to stand from EOB and ambulate to bathroom for toilet transfers with supervison ?  ?  ?Balance Overall balance assessment: Needs assistance ?Sitting-balance support: No upper extremity supported, Feet supported ?Sitting balance-Leahy Scale: Fair ?Sitting balance - Comments: sitting on EOB upon entry ?  ?Standing balance support: No upper extremity supported, During functional activity, Bilateral upper extremity supported ?Standing balance-Leahy Scale: Fair ?Standing balance comment: able to stand at sink without UE support ?  ?  ?  ?  ?  ?  ?  ?  ?  ?  ?  ?   ? ?ADL either performed or assessed with clinical judgement  ? ?ADL Overall ADL's : Needs assistance/impaired ?  ?  ?Grooming: Wash/dry hands;Supervision/safety;Standing ?Grooming Details (indicate cue type and reason): at sink ?  ?  ?  ?  ?  ?  ?Lower Body Dressing: Supervision/safety;With adaptive equipment ?Lower Body Dressing Details (indicate cue type and reason): able to doff and donn socks seated on EOB with reacher and sock aide ?Toilet Transfer: Supervision/safety;Comfort height toilet;Rolling walker (2 wheels) ?  ?Toileting- Clothing Manipulation and Hygiene: Modified independent;Sitting/lateral lean ?  ?  ?  ?  ?General ADL Comments: continues to be limited by pain ?  ? ?Extremity/Trunk Assessment   ?  ?  ?  ?  ?  ? ?  Vision   ?  ?  ?Perception   ?  ?Praxis   ?  ? ?Cognition Arousal/Alertness: Awake/alert ?Behavior During Therapy: [facility] for tasks assessed/performed ?Overall Cognitive Status: Within Functional Limits for tasks assessed ?  ?  ?  ?  ?  ?  ?  ?  ?  ?  ?  ?  ?  ?  ?  ?  ?  ?  ?  ?    ?Exercises   ? ?  ?Shoulder Instructions   ? ? ?  ?General Comments    ? ? ?Pertinent Vitals/ Pain       Pain Assessment ?Pain Assessment: Faces ?Faces Pain Scale: Hurts even more ?Pain Location: RLE ?Pain Descriptors / Indicators: Grimacing, Sharp ?Pain Intervention(s): Limited activity within patient's tolerance, Monitored during session, Repositioned ? ?Home Living   ?  ?  ?  ?  ?  ?  ?  ?  ?  ?  ?  ?  ?  ?  ?  ?  ?  ?  ? ?  ?Prior Functioning/Environment    ?  ?  ?  ?   ? ?Frequency ? Min 2X/week  ? ? ? ? ?  ?Progress Toward Goals ? ?OT Goals(current goals can now be found in the care plan section) ? Progress towards OT goals: Progressing toward goals ? ?Acute Rehab OT Goals ?Patient Stated Goal: decrease pain ?OT Goal Formulation: With patient ?Time For Goal Achievement: 04/30/22 ?Potential to Achieve Goals: Good ?ADL Goals ?Pt Will Perform Lower Body Dressing: with min assist;sit to/from stand;with adaptive equipment ?Pt Will Transfer to Toilet: with min assist;stand pivot transfer;bedside commode ?Pt Will Perform Toileting - Clothing Manipulation and hygiene: with min assist;sit to/from stand;sitting/lateral leans ?Additional ADL Goal #1: Pt will perform bed mobility with [place] A in preparation for ADLs  ?Plan Discharge plan remains appropriate   ? ?Co-evaluation ? ? ?   ?  ?  ?  ?  ? ?  ?AM-PAC OT "6 Clicks" Daily Activity     ?Outcome Measure ? ? Help from another person eating meals?: None ?Help from another person taking care of personal grooming?: A Little ?Help from another person toileting, which includes using toliet, bedpan, or urinal?: A Little ?Help from another person bathing (including washing, rinsing, drying)?: A Little ?Help from another person to put on and taking off regular upper body clothing?: A Little ?Help from another person to put on and taking off regular lower body clothing?: A Little ?6 Click Score: 19 ? ?  ?End of Session Equipment Utilized During Treatment: Rolling walker (2  wheels) ? ?OT Visit Diagnosis: Unsteadiness on feet (R26.81);Other abnormalities of gait and mobility (R26.89);Muscle weakness (generalized) (M62.81) ?  ?Activity Tolerance Patient limited by pain ?  ?Patient Left in bed;with call bell/phone within reach;with bed alarm set ?  ?Nurse Communication Mobility status ?  ? ?   ? ?Time: 5053-9767 ?OT Time Calculation (min): 18 min ? ?Charges: OT General Charges ?$OT Visit: 1 Visit ?OT Treatments ?$Self Care/Home Management : 8-22 mins ? ?Lodema Hong, OTA ?Acute Rehabilitation Services  ?Pager [phone] ?Office [phone] ? ? ?[place] ?04/20/2022, 2:19 PM ?

## 2022-04-21 ENCOUNTER — Inpatient Hospital Stay (HOSPITAL_COMMUNITY): Payer: PPO

## 2022-04-21 DIAGNOSIS — R52 Pain, unspecified: Secondary | ICD-10-CM | POA: Diagnosis not present

## 2022-04-21 LAB — SYNOVIAL CELL COUNT + DIFF, W/ CRYSTALS
Crystals, Fluid: NONE SEEN
WBC, Synovial: 3 /mm3 (ref 0–200)

## 2022-04-21 MED ORDER — SODIUM CHLORIDE (PF) 0.9 % IJ SOLN
3.0000 mL | Freq: Once | INTRAMUSCULAR | Status: AC
  Administered 2022-04-21: 3 mL

## 2022-04-21 MED ORDER — SENNA 8.6 MG PO TABS: 1.0000 | ORAL_TABLET | Freq: Every day | ORAL | 0 refills | Status: AC

## 2022-04-21 MED ORDER — OXYCODONE HCL 5 MG PO TABS: 5.0000 mg | ORAL_TABLET | Freq: Four times a day (QID) | ORAL | 0 refills | Status: AC | PRN

## 2022-04-21 MED ORDER — PROMETHAZINE HCL 12.5 MG PO TABS: 12.5000 mg | ORAL_TABLET | Freq: Four times a day (QID) | ORAL | 0 refills | Status: DC | PRN

## 2022-04-21 MED ORDER — POLYETHYLENE GLYCOL 3350 17 G PO PACK: 17.0000 g | PACK | Freq: Every day | ORAL | 0 refills | Status: DC | PRN

## 2022-04-21 MED ORDER — TIZANIDINE HCL 2 MG PO TABS: 2.0000 mg | ORAL_TABLET | Freq: Four times a day (QID) | ORAL | 0 refills | Status: DC | PRN

## 2022-04-21 MED ORDER — LIDOCAINE HCL (PF) 1 % IJ SOLN
5.0000 mL | Freq: Once | INTRAMUSCULAR | Status: AC
  Administered 2022-04-21: 5 mL via INTRADERMAL

## 2022-04-21 NOTE — Plan of Care (Signed)

## 2022-04-21 NOTE — Discharge Summary (Signed)
?Physician Discharge Summary  ?Todd Salazar PJK:[id number] DOB: 1936-07-24 DOA: 04/15/2022 ? ?PCP: Todd Oman, DO ? ?Admit date: 04/15/2022 ?Discharge date: 04/21/2022 ? ?Admitted From: Home ?Disposition: Home with home health ? ?Recommendations for Outpatient Follow-up:  ?Follow up with PCP in 1-2 weeks ?Follow-up with orthopedics, Dr. Alvan Dame per Dr. Zachery Dakins in 1-2 weeks ?Follow-up with pain medicine clinic, Dr. Davy Pique per neurosurgery Dr. Ronnald Ramp ? ? ?Home Health: PT/OT/social work ?Equipment/Devices: Patient already has a walker at home ? ?Discharge Condition: Stable ?CODE STATUS: Full code ?Diet recommendation: Healthy diet ? ?History of present illness: ? ?Atthew Salazar is an 86 year old male with past medical history significant for bradycardia s/p PPM, paroxysmal atrial fibrillation on Eliquis, CKD stage IIIa, osteoarthritis s/p bilateral total hip arthroplasty, prostate cancer who presented to [facility] ED on 5/5 with complaints of right hip pain and nausea/vomiting.  Recently seen in the ED for same problem with work-up unrevealing in terms of labs, CT abdomen/pelvis, MR right hip and lumbar spine.  Patient has been seen by 2 different orthopedic physicians and neurosurgery without surgical intervention needed at this time.  Patient is pending right hip joint aspiration per recommendation of Dr. Yetta Numbers to rule out joint infection.  Seen by PT/OT and recommending home health on discharge. ? ?Hospital course: ? ?Right hip pain ?Hx OA s/p bilateral THA ?Patient with history of right THA 06/2017 and remote left THA who presented to the ED with severe right hip pain.  No radiation of pain, no radiculopathy, no paresthesias or bowel/bladder incontinence.  MR L-spine 5/1 did not show any high-grade spinal stenosis.  MR right hip with previous bilateral THA but no significant joint effusion or osteolysis, no fracture or dislocation.  ESR slightly elevated at 20 and CRP within normal limits at 0.7.  Afebrile without  leukocytosis. Was evaluated by orthopedics, Dr. Doran Durand on 5/7 with recommendation of evaluation by neurosurgery, Dr. Ronnald Ramp; with eventual follow-up with Dr. Alvan Dame in outpatient clinic 2-3 weeks following discharge.  Was evaluated by Dr. Ronnald Ramp on 5/8, and no concerns from a neurosurgical standpoint with recommendations of follow-up in their pain management center with Dr. Davy Pique on discharge.  Patient and family requesting second orthopedic opinion; was evaluated by Dr. Zachery Dakins on 5/9, and recommended aspiration of right hip joint to rule out septic arthritis given inability to bear weight/ambulate.  Underwent IR fluoroscopic right hip joint aspiration on 04/21/2022 with only 3 WBCs noted, not likely consistent with infection.  Recommend outpatient follow-up with pain management clinic Dr. Davy Pique neurosurgery recommendations and follow-up with orthopedics Dr. Alvan Dame per Dr. Ephraim Hamburger recommendations.  May consider consideration of an ultrasound-guided right hip abductor injection per orthopedics outpatient.  Discharging home with home health PT/OT. ?  ?Nausea/vomiting: Resolved ?CT abdomen/pelvis without acute findings.  Continue Phenergan as needed. ?  ?Paroxysmal atrial fibrillation ?Hx symptomatic bradycardia s/p PPM.  Continue Eliquis ?  ?CKD stage IIIa ?Creatinine 1.42>1.44>1.38, stable. ?  ?History of prostate cancer ?Continue outpatient follow-up with urology at [facility] ? ? ?Discharge Diagnoses:  ?Principal Problem: ?  Intractable pain ?Active Problems: ?  Acute right hip pain ?  Persistent atrial fibrillation ([place]) ?  Nausea & vomiting ?  CKD-3A ?  Normocytic anemia ? ? ? ?Discharge Instructions ? ?Discharge Instructions   ? ? Call MD for:  difficulty breathing, headache or visual disturbances   Complete by: As directed ?  ? Call MD for:  extreme fatigue   Complete by: As directed ?  ? Call MD  for:  persistant dizziness or light-headedness   Complete by: As directed ?  ? Call MD  for:  persistant nausea and vomiting   Complete by: As directed ?  ? Call MD for:  severe uncontrolled pain   Complete by: As directed ?  ? Call MD for:  temperature >100.4   Complete by: As directed ?  ? Diet - low sodium heart healthy   Complete by: As directed ?  ? Increase activity slowly   Complete by: As directed ?  ? ?  ? ?Allergies as of 04/21/2022   ? ?   Reactions  ? Ciprofloxacin Anaphylaxis  ? Dizziness  ? Tramadol Swelling  ? Laryngeal edema  ? Duloxetine Other (See Comments)  ? delusions  ? Hydrocodone-acetaminophen Nausea And Vomiting  ? Benadryl [diphenhydramine] Other (See Comments)  ? Panic attack   ? Nisoldipine Other (See Comments)  ? Pt unsure of reaction   ? Methocarbamol Nausea Only  ? Ondansetron Hcl Nausea Only  ? Sulfa Antibiotics Rash  ? ?  ? ?  ?Medication List  ?  ? ?STOP taking these medications   ? ?diclofenac Sodium 1 % Gel ?Commonly known as: VOLTAREN ?  ?methylPREDNISolone 4 MG Tbpk tablet ?Commonly known as: MEDROL DOSEPAK ?  ?morphine 15 MG tablet ?Commonly known as: MSIR ?  ?nitroGLYCERIN 0.4 MG SL tablet ?Commonly known as: NITROSTAT ?  ?ondansetron 8 MG tablet ?Commonly known as: ZOFRAN ?  ? ?  ? ?TAKE these medications   ? ?apixaban 5 MG Tabs tablet ?Commonly known as: ELIQUIS ?Take 1 tablet (5 mg total) by mouth 2 (two) times daily. ?  ?gabapentin 100 MG capsule ?Commonly known as: NEURONTIN ?Take 100 mg by mouth at bedtime as needed (for sleep). ?  ?oxyCODONE 5 MG immediate release tablet ?Commonly known as: Oxy IR/ROXICODONE ?Take 1-2 tablets (5-10 mg total) by mouth every 6 (six) hours as needed for up to 7 days for moderate pain. ?  ?polyethylene glycol 17 g packet ?Commonly known as: MIRALAX / GLYCOLAX ?Take 17 g by mouth daily as needed for mild constipation. ?  ?promethazine 12.5 MG tablet ?Commonly known as: PHENERGAN ?Take 1 tablet (12.5 mg total) by mouth every 6 (six) hours as needed for nausea or vomiting. ?  ?senna 8.6 MG Tabs tablet ?Commonly known as:  SENOKOT ?Take 1 tablet (8.6 mg total) by mouth daily. ?Start taking on: Apr 22, 2022 ?  ?tiZANidine 2 MG tablet ?Commonly known as: ZANAFLEX ?Take 1 tablet (2 mg total) by mouth every 6 (six) hours as needed for muscle spasms. ?  ? ?  ? ? Follow-up Information   ? ? Care, [facility] Follow up.   ?Specialty: Home Health Services ?Contact information: ?[address] ?[address] ?[place] [address] ?[phone] ? ? ?  ?  ? ? [facility] Follow up.   ?Contact information: ?[phone] ? ?  ?  ? ? Paralee Cancel, MD. Schedule an appointment as soon as possible for a visit in 1 week(s).   ?Specialty: Orthopedic Surgery ?Contact information: ?[address] ?[address] ?[place] [address] ?[phone] ? ? ?  ?  ? ? Reece Agar, MD. Schedule an appointment as soon as possible for a visit in 1 week(s).   ?Specialty: Pain Medicine ?Contact information: ?[address] ?[address] ?[place] [address] ?[phone] ? ? ?  ?  ? ? Eustace Moore, MD. Schedule an appointment as soon as possible for  a visit.   ?Specialty: Neurosurgery ?Contact information: ?[address] ?[address] ?[place] [address] ?[phone] ? ? ?  ?  ? ? Todd Oman, DO. Schedule an appointment as soon as possible for a visit in 1 week(s).   ?Specialty: Internal Medicine ?Contact information: ?[address] ?[address] ?[place] [address] ?[phone] ? ? ?  ?  ? ? Camnitz, Ocie Doyne, MD .   ?Specialty: Cardiology ?Contact information: ?[address] ?[address] ?[place] [address] ?[phone] ? ? ?  ?  ? ?  ?  ? ?  ? ?Allergies  ?Allergen Reactions  ? Ciprofloxacin Anaphylaxis  ?  Dizziness ?  ? Tramadol Swelling  ?  Laryngeal edema  ? Duloxetine Other (See Comments)  ?  delusions  ? Hydrocodone-Acetaminophen Nausea And Vomiting  ? Benadryl [Diphenhydramine] Other (See Comments)  ?  Panic attack   ? Nisoldipine Other (See Comments)  ?  Pt unsure of reaction   ? Methocarbamol Nausea Only  ?  Ondansetron Hcl Nausea Only  ? Sulfa Antibiotics Rash  ? ? ?Consultations: ?Orthopedics, Dr. Doran Durand ?Orthopedics, Dr. Zachery Dakins ?Neurosurgery, Dr. Ronnald Ramp ? ? ?Procedures/Studies: ?MR Lumbar Spine W Wo Contrast ? ?Result Date

## 2022-04-21 NOTE — TOC Progression Note (Signed)
Transition of Care (TOC) - Progression Note  ? ? ?Patient Details  ?Name: Todd Salazar ?MRN: [id number] ?Date of Birth: 23-Mar-1936 ? ?Transition of Care (TOC) CM/SW Contact  ?Marilu Favre, RN ?Phone Number: ?04/21/2022, 10:17 AM ? ?Clinical Narrative:    ? ?April with [facility] called. Patient has been approved for 20 hours of care. April has spoken to patient's daughter in law . April requesting a call when patient is discharged and they will be ready to start care . They will discuss with family schedule.  ? ?Cory with [facility] updated  ? ?Expected Discharge Plan: [place] ?Barriers to Discharge: Continued Medical Work up ? ?Expected Discharge Plan and Services ?Expected Discharge Plan: [place] ?  ?Discharge Planning Services: CM Consult ?Post Acute Care Choice: Home Health ?Living arrangements for the past 2 months: [place] ?                ?DME Arranged: N/A ?DME Agency: NA ?  ?  ?  ?HH Arranged: PT, OT, Social Work ?[place] Agency: [place] ?Date HH Agency Contacted: 04/19/22 ?Time [place]: 1129 ?Representative spoke with at [facility]: Tommi Rumps ? ? ?Social Determinants of Health (SDOH) Interventions ?  ? ?Readmission Risk Interventions ?   ? View : No data to display.  ?  ?  ?  ? ? ?

## 2022-04-21 NOTE — Progress Notes (Signed)
Mobility Specialist Progress Note: ? ? 04/21/22 0911  ?Mobility  ?Activity Ambulated with assistance in hallway  ?Level of Assistance Standby assist, set-up cues, supervision of patient - no hands on  ?Assistive Device Front wheel walker  ?Distance Ambulated (ft) 70 ft  ?Activity Response Tolerated well  ?$Mobility charge 1 Mobility  ? ?Pt received in bed willing to participate in mobility. Complaints of 8/10 hip pain. Left in bed with call bell in reach and all needs met.  ? ?[place] ?Mobility Specialist ?Primary Phone [phone] ? ?

## 2022-04-24 LAB — BODY FLUID CULTURE W GRAM STAIN
Culture: NO GROWTH
Gram Stain: NONE SEEN

## 2022-05-30 ENCOUNTER — Inpatient Hospital Stay (HOSPITAL_COMMUNITY)
Admission: EM | Admit: 2022-05-30 | Discharge: 2022-06-01 | DRG: 308 | Disposition: A | Discharge status: Home / Self Care | Payer: PPO | Attending: Cardiology | Admitting: Cardiology

## 2022-05-30 ENCOUNTER — Other Ambulatory Visit: Payer: Self-pay

## 2022-05-30 ENCOUNTER — Emergency Department (HOSPITAL_COMMUNITY): Payer: PPO

## 2022-05-30 ENCOUNTER — Telehealth: Payer: Self-pay

## 2022-05-30 ENCOUNTER — Encounter (HOSPITAL_COMMUNITY): Payer: Self-pay

## 2022-05-30 DIAGNOSIS — Z8546 Personal history of malignant neoplasm of prostate: Secondary | ICD-10-CM

## 2022-05-30 DIAGNOSIS — I951 Orthostatic hypotension: Secondary | ICD-10-CM | POA: Diagnosis present

## 2022-05-30 DIAGNOSIS — Z87442 Personal history of urinary calculi: Secondary | ICD-10-CM

## 2022-05-30 DIAGNOSIS — Z950 Presence of cardiac pacemaker: Secondary | ICD-10-CM | POA: Diagnosis not present

## 2022-05-30 DIAGNOSIS — Z79899 Other long term (current) drug therapy: Secondary | ICD-10-CM | POA: Diagnosis not present

## 2022-05-30 DIAGNOSIS — Z96643 Presence of artificial hip joint, bilateral: Secondary | ICD-10-CM | POA: Diagnosis present

## 2022-05-30 DIAGNOSIS — Z7901 Long term (current) use of anticoagulants: Secondary | ICD-10-CM

## 2022-05-30 DIAGNOSIS — N1831 Chronic kidney disease, stage 3a: Secondary | ICD-10-CM | POA: Diagnosis present

## 2022-05-30 DIAGNOSIS — I4891 Unspecified atrial fibrillation: Secondary | ICD-10-CM | POA: Diagnosis not present

## 2022-05-30 DIAGNOSIS — E43 Unspecified severe protein-calorie malnutrition: Secondary | ICD-10-CM | POA: Diagnosis present

## 2022-05-30 DIAGNOSIS — Z87891 Personal history of nicotine dependence: Secondary | ICD-10-CM

## 2022-05-30 DIAGNOSIS — Z6821 Body mass index (BMI) 21.0-21.9, adult: Secondary | ICD-10-CM

## 2022-05-30 DIAGNOSIS — Z888 Allergy status to other drugs, medicaments and biological substances status: Secondary | ICD-10-CM

## 2022-05-30 DIAGNOSIS — Z981 Arthrodesis status: Secondary | ICD-10-CM

## 2022-05-30 DIAGNOSIS — I071 Rheumatic tricuspid insufficiency: Secondary | ICD-10-CM | POA: Diagnosis present

## 2022-05-30 DIAGNOSIS — I361 Nonrheumatic tricuspid (valve) insufficiency: Secondary | ICD-10-CM | POA: Diagnosis not present

## 2022-05-30 DIAGNOSIS — I4892 Unspecified atrial flutter: Principal | ICD-10-CM

## 2022-05-30 DIAGNOSIS — I10 Essential (primary) hypertension: Secondary | ICD-10-CM | POA: Diagnosis not present

## 2022-05-30 DIAGNOSIS — I495 Sick sinus syndrome: Secondary | ICD-10-CM | POA: Diagnosis present

## 2022-05-30 DIAGNOSIS — Z9049 Acquired absence of other specified parts of digestive tract: Secondary | ICD-10-CM

## 2022-05-30 DIAGNOSIS — I480 Paroxysmal atrial fibrillation: Secondary | ICD-10-CM | POA: Diagnosis present

## 2022-05-30 DIAGNOSIS — Z881 Allergy status to other antibiotic agents status: Secondary | ICD-10-CM

## 2022-05-30 DIAGNOSIS — Z885 Allergy status to narcotic agent status: Secondary | ICD-10-CM | POA: Diagnosis not present

## 2022-05-30 DIAGNOSIS — I2510 Atherosclerotic heart disease of native coronary artery without angina pectoris: Secondary | ICD-10-CM | POA: Diagnosis present

## 2022-05-30 DIAGNOSIS — Z8249 Family history of ischemic heart disease and other diseases of the circulatory system: Secondary | ICD-10-CM

## 2022-05-30 DIAGNOSIS — F419 Anxiety disorder, unspecified: Secondary | ICD-10-CM | POA: Diagnosis present

## 2022-05-30 DIAGNOSIS — R002 Palpitations: Secondary | ICD-10-CM | POA: Diagnosis present

## 2022-05-30 LAB — CBC WITH DIFFERENTIAL/PLATELET
Abs Immature Granulocytes: 0.02 10*3/uL (ref 0.00–0.07)
Basophils Absolute: 0.1 10*3/uL (ref 0.0–0.1)
Basophils Relative: 1 %
Eosinophils Absolute: 0.1 10*3/uL (ref 0.0–0.5)
Eosinophils Relative: 2 %
HCT: 34.9 % — ABNORMAL LOW (ref 39.0–52.0)
Hemoglobin: 10.8 g/dL — ABNORMAL LOW (ref 13.0–17.0)
Immature Granulocytes: 0 %
Lymphocytes Relative: 27 %
Lymphs Abs: 1.5 10*3/uL (ref 0.7–4.0)
MCH: 32.9 pg (ref 26.0–34.0)
MCHC: 30.9 g/dL (ref 30.0–36.0)
MCV: 106.4 fL — ABNORMAL HIGH (ref 80.0–100.0)
Monocytes Absolute: 0.6 10*3/uL (ref 0.1–1.0)
Monocytes Relative: 12 %
Neutro Abs: 3.1 10*3/uL (ref 1.7–7.7)
Neutrophils Relative %: 58 %
Platelets: 447 10*3/uL — ABNORMAL HIGH (ref 150–400)
RBC: 3.28 MIL/uL — ABNORMAL LOW (ref 4.22–5.81)
RDW: 23.8 % — ABNORMAL HIGH (ref 11.5–15.5)
WBC: 5.4 10*3/uL (ref 4.0–10.5)
nRBC: 0.4 % — ABNORMAL HIGH (ref 0.0–0.2)

## 2022-05-30 LAB — HEPATIC FUNCTION PANEL
ALT: 19 U/L (ref 0–44)
AST: 28 U/L (ref 15–41)
Albumin: 3.7 g/dL (ref 3.5–5.0)
Alkaline Phosphatase: 77 U/L (ref 38–126)
Bilirubin, Direct: 0.4 mg/dL — ABNORMAL HIGH (ref 0.0–0.2)
Indirect Bilirubin: 1.3 mg/dL — ABNORMAL HIGH (ref 0.3–0.9)
Total Bilirubin: 1.7 mg/dL — ABNORMAL HIGH (ref 0.3–1.2)
Total Protein: 6.7 g/dL (ref 6.5–8.1)

## 2022-05-30 LAB — BASIC METABOLIC PANEL
Anion gap: 10 (ref 5–15)
BUN: 16 mg/dL (ref 8–23)
CO2: 22 mmol/L (ref 22–32)
Calcium: 9.2 mg/dL (ref 8.9–10.3)
Chloride: 109 mmol/L (ref 98–111)
Creatinine, Ser: 1.22 mg/dL (ref 0.61–1.24)
GFR, Estimated: 58 mL/min — ABNORMAL LOW (ref >60)
Glucose, Bld: 94 mg/dL (ref 70–99)
Potassium: 4.4 mmol/L (ref 3.5–5.1)
Sodium: 141 mmol/L (ref 135–145)

## 2022-05-30 LAB — TROPONIN I (HIGH SENSITIVITY)
Troponin I (High Sensitivity): 9 ng/L (ref <18)
Troponin I (High Sensitivity): 9 ng/L (ref <18)

## 2022-05-30 LAB — BRAIN NATRIURETIC PEPTIDE: B Natriuretic Peptide: 256.1 pg/mL — ABNORMAL HIGH (ref 0.0–100.0)

## 2022-05-30 LAB — MAGNESIUM: Magnesium: 2.1 mg/dL (ref 1.7–2.4)

## 2022-05-30 MED ORDER — NITROGLYCERIN 0.4 MG SL SUBL: 0.4000 mg | SUBLINGUAL_TABLET | SUBLINGUAL | Status: DC | PRN

## 2022-05-30 MED ORDER — DILTIAZEM HCL-DEXTROSE 125-5 MG/125ML-% IV SOLN (PREMIX)
5.0000 mg/h | INTRAVENOUS | Status: DC
  Administered 2022-05-30: 5 mg/h via INTRAVENOUS
  Administered 2022-05-31: 7.5 mg/h via INTRAVENOUS
  Filled 2022-05-30 (×3): qty 125

## 2022-05-30 MED ORDER — LACTATED RINGERS IV BOLUS
1000.0000 mL | Freq: Once | INTRAVENOUS | Status: AC
  Administered 2022-05-30: 1000 mL via INTRAVENOUS

## 2022-05-30 MED ORDER — METOPROLOL TARTRATE 5 MG/5ML IV SOLN: 5.0000 mg | Freq: Once | INTRAVENOUS | Status: DC

## 2022-05-30 MED ORDER — GABAPENTIN 100 MG PO CAPS: 100.0000 mg | ORAL_CAPSULE | Freq: Every evening | ORAL | Status: DC | PRN

## 2022-05-30 MED ORDER — TIZANIDINE HCL 4 MG PO TABS: 2.0000 mg | ORAL_TABLET | Freq: Four times a day (QID) | ORAL | Status: DC | PRN

## 2022-05-30 MED ORDER — DILTIAZEM LOAD VIA INFUSION
10.0000 mg | Freq: Once | INTRAVENOUS | Status: AC
  Administered 2022-05-30: 10 mg via INTRAVENOUS
  Filled 2022-05-30: qty 10

## 2022-05-30 MED ORDER — APIXABAN 5 MG PO TABS
5.0000 mg | ORAL_TABLET | Freq: Two times a day (BID) | ORAL | Status: DC
  Administered 2022-05-30 – 2022-06-01 (×4): 5 mg via ORAL
  Filled 2022-05-30 (×4): qty 1

## 2022-05-30 MED ORDER — IOHEXOL 350 MG/ML SOLN
80.0000 mL | Freq: Once | INTRAVENOUS | Status: AC | PRN
  Administered 2022-05-30: 80 mL via INTRAVENOUS

## 2022-05-30 NOTE — Telephone Encounter (Signed)
[place] alert for AF/flutter ongoing from 6/16 @ 21:37, not always good rate control Burden 4.4%, Eliquis.  Called patient to assess and check for medication compliance. No answer, LMTCB.

## 2022-05-30 NOTE — ED Notes (Signed)
Patient transported to CT 

## 2022-05-30 NOTE — ED Notes (Signed)
St. Jude rep reports pt has been in A-flutter w/RVR multiple episodes since June 12th. The highest episode was 180. Rep is faxing over report now.

## 2022-05-30 NOTE — Telephone Encounter (Signed)
Pt returning nurse call. I got a manual transmission. I let him speak with Leigh, rn.

## 2022-05-30 NOTE — Telephone Encounter (Signed)
Patient returned phone call. Reports of increased fatigue, palpitations, shortness of breath, lightheaded and dizzy for over the past week.  Reports he is currently having fatigue and shortness of breath at this time.  Patient also had back surgery 2 weeks ago and was hospitalized for 5 days. Currently Eliquis 5 mg BID on file and patient reports compliance. No other cardiac mediations noted.   Advised patient he needs to go to [facility] ED for evaluation considering high heart rate and recent surgery.  Patient was agreeable and has someone who can drive him.  Advised patient not to drive and if needed call 911. Voiced understanding and agreeable to plan.

## 2022-05-30 NOTE — H&P (Addendum)
Cardiology Admission History and Physical:   Patient ID: Todd Salazar MRN: [id number]; DOB: 1936/03/30   Admission date: 05/30/2022  PCP:  [name], DO   [place] Providers Cardiologist:  None  Electrophysiologist:  Will Meredith Leeds, MD  {   Chief Complaint:  Aflutter  Patient Profile:   Todd Salazar is a 86 y.o. 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  Admitted to hospital September 2022 for atrial fibrillation with rapid ventricular rate.  Loaded with dofetilide and underwent cardioversion .  He underwent ablation November 2022.  He was treated with colchicine for post ablation pericarditis.   Last seen by Dr. Curt Bears April 2023.   History of Present Illness:   Todd Salazar underwent back surgery on June 5.  His Eliquis was held pre and postoperatively.  He is taking his Eliquis since June 8.  On device check he was noted to be in atrial fibrillation with rapid ventricular rate from 6/16.  Patient is symptomatic with palpitation, shortness of breath, dizziness and fatigue.  He was instructed to come to ER for further evaluation.  EKG with atrial flutter at rate of 134 bpm.  Cardiology is asked for further evaluation.  Creatinine normal Potassium 4.4 BNP 250 Troponin negative x2 Hemoglobin 10.8 CT angio  of chest without pulmonary embolism  Past Medical History:  Diagnosis Date   Arthritis    Atrial fibrillation ([place])    Cancer ([place])    prostate   Dysrhythmia    afib   History of kidney stones     Past Surgical History:  Procedure Laterality Date   ATRIAL FIBRILLATION ABLATION N/A 10/29/2021   Procedure: ATRIAL FIBRILLATION ABLATION;  Surgeon: Constance Haw, MD;  Location: [place] [facility];   Service: Cardiovascular;  Laterality: N/A;   Bone spur     CHOLECYSTECTOMY     HERNIA REPAIR     JOINT REPLACEMENT     kidney stones     LAMINECTOMY WITH POSTERIOR LATERAL ARTHRODESIS LEVEL 1 Right 11/25/2019   Procedure: Laminectomy and Foraminotomy  Lumbar Four-Lumbar Five - right, with instrumented fusion;  Surgeon: Eustace Moore, MD;  Location: [facility];  Service: Neurosurgery;  Laterality: Right;  Laminectomy and Foraminotomy  Lumbar Four-Lumbar Five - right, with instrumented fusion   PROSTATE SURGERY       Medications Prior to Admission: Prior to Admission medications   Medication Sig Start Date End Date Taking? Authorizing Provider  apixaban (ELIQUIS) 5 MG TABS tablet Take 1 tablet (5 mg total) by mouth 2 (two) times daily. 03/18/22   Camnitz, Ocie Doyne, MD  gabapentin (NEURONTIN) 100 MG capsule Take 100 mg by mouth at bedtime as needed (for sleep). 01/24/22   [provider]  polyethylene glycol (MIRALAX / GLYCOLAX) 17 g packet Take 17 g by mouth daily as needed for mild constipation. 04/21/22   [place] Donnamarie Poag, DO  promethazine (PHENERGAN) 12.5 MG tablet Take 1 tablet (12.5 mg total) by mouth every 6 (six) hours as needed for nausea or vomiting. 04/21/22   [place] Donnamarie Poag, DO  tiZANidine (ZANAFLEX) 2 MG tablet Take 1 tablet (2 mg total) by mouth every 6 (six) hours as needed for muscle spasms. 04/21/22   [place] Eric J, DO     Allergies:    Allergies  Allergen Reactions   Ciprofloxacin Anaphylaxis    Dizziness    Tramadol Swelling    Laryngeal edema   Duloxetine Other (See Comments)    delusions   Hydrocodone-Acetaminophen Nausea And Vomiting   Benadryl [Diphenhydramine] Other (See Comments)    Panic attack    Nisoldipine Other (See Comments)    Pt unsure of reaction    Methocarbamol Nausea Only   Ondansetron Hcl Nausea Only   Sulfa Antibiotics Rash    Social History:   Social History   Socioeconomic History   Marital status: Widowed    Spouse name: Not on  file   Number of children: Not on file   Years of education: Not on file   Highest education level: Not on file  Occupational History   Not on file  Tobacco Use   Smoking status: Former    Types: Cigarettes    Quit date: 12/10/1996    Years since quitting: 25.4   Smokeless tobacco: Never   Tobacco comments:    Former smoker (08/27/2021)  Vaping Use   Vaping Use: Never used  Substance and Sexual Activity   Alcohol use: No   Drug use: No   Sexual activity: Never  Other Topics Concern   Not on file  Social History Narrative   Not on file   Social Determinants of Health   Financial Resource Strain: Not on file  Food Insecurity: Not on file  Transportation Needs: Not on file  Physical Activity: Not on file  Stress: Not on file  Social Connections: Not on file  Intimate Partner Violence: Not on file    Family History:   The patient's family history includes Hypertension in his father. There is no history of CAD, Heart failure, or Stroke.    ROS:  Please see the history of present illness.  All other ROS reviewed and negative.     Physical Exam/Data:   Vitals:   05/30/22 1507 05/30/22 1519 05/30/22 1732  BP: 118/72  111/82  Pulse: (!) 134  (!) 131  Resp: 16  16  Temp: 98.1 F (36.7 C)    SpO2: 99%  99%  Weight:  85.7 kg   Height:  '6\' 3"'$  (1.905 m)    No intake or output data in the 24 hours ending 05/30/22 1859    05/30/2022    3:19 PM 04/15/2022   10:02 PM 04/11/2022   10:57 AM  Last 3 Weights  Weight (lbs) 189 lb 189 lb 9.5 oz 190 lb  Weight (kg) 85.73 kg 86 kg 86.183 kg     Body mass index is 23.62 kg/m.  General:  Well nourished, well developed, in no acute distress HEENT: normal Neck: no JVD Vascular: No carotid bruits; Distal pulses 2+ bilaterally   Cardiac:  normal S1, S2; regular tachycardic; no murmur  Lungs:  clear to auscultation bilaterally, no wheezing, rhonchi or rales  Abd: soft, nontender, no hepatomegaly  Ext: no edema Musculoskeletal:   No deformities, BUE and BLE strength normal and equal Skin: warm and dry  Neuro:  CNs 2-12 intact, no focal abnormalities noted Psych:  Normal affect    EKG:  The ECG that was done today was personally reviewed and demonstrates atrial flutter  Relevant CV Studies: TTE 08/2021: IMPRESSIONS     1. Left ventricular ejection fraction, by estimation, is 55 to 60%. The  left ventricle has normal function. The left ventricle has no regional  wall motion abnormalities. There is mild left ventricular hypertrophy.  Left ventricular diastolic parameters  were normal.   2. Right ventricular systolic function is normal. The right ventricular  size is mildly enlarged. There is normal pulmonary artery systolic  pressure. The estimated right ventricular systolic pressure is 79.8 mmHg.   3. Right atrial size was mildly dilated.   4. The mitral valve is normal in structure. Trivial mitral valve  regurgitation.   5. Tricuspid valve regurgitation is mild to moderate.   6. The aortic valve is tricuspid. Aortic valve regurgitation is not  visualized. No aortic stenosis is present.   7. The inferior vena cava is normal in size with greater than 50%  respiratory variability, suggesting right atrial pressure of 3 mmHg.   CT cardiac morph 10/2021: IMPRESSION: 1. There is normal pulmonary vein drainage into the left atrium with ostial measurements above. Normal variant of common left ostium.   2. There is no thrombus in the left atrial appendage.   3. The esophagus runs in the left atrial midline and is in proximity to the posterior common left pulmonary vein ostium   4. No PFO/ASD.   5. Normal coronary origin. Right dominance.   6. CAC score of 82 which is 16th percentile for age-, race-, and sex-matched controls.  Laboratory Data:  High Sensitivity Troponin:   Recent Labs  Lab 05/30/22 1534 05/30/22 1740  TROPONINIHS 9 9      Chemistry Recent Labs  Lab 05/30/22 1534 05/30/22 1740   NA 141  --   K 4.4  --   CL 109  --   CO2 22  --   GLUCOSE 94  --   BUN 16  --   CREATININE 1.22  --   CALCIUM 9.2  --   MG  --  2.1  GFRNONAA 58*  --   ANIONGAP 10  --     Recent Labs  Lab 05/30/22 1740  PROT 6.7  ALBUMIN 3.7  AST 28  ALT 19  ALKPHOS 77  BILITOT 1.7*   Lipids No results for input(s): "CHOL", "TRIG", "HDL", "LABVLDL", "LDLCALC", "CHOLHDL" in the last 168 hours. Hematology Recent Labs  Lab 05/30/22 1534  WBC 5.4  RBC 3.28*  HGB 10.8*  HCT 34.9*  MCV 106.4*  MCH 32.9  MCHC 30.9  RDW 23.8*  PLT 447*   Thyroid No results for input(s): "TSH", "FREET4" in the last 168 hours. BNP Recent Labs  Lab 05/30/22 1740  BNP 256.1*    DDimer No results for input(s): "DDIMER" in the last 168 hours.   Radiology/Studies:  CT Angio Chest PE W and/or Wo Contrast  Result Date: 05/30/2022 CLINICAL DATA:  A male age 43 presents for evaluation of tachycardia and suspicion for pulmonary embolism. EXAM: CT ANGIOGRAPHY CHEST WITH CONTRAST TECHNIQUE: Multidetector CT imaging of the chest was performed using the standard protocol during bolus administration of intravenous contrast. Multiplanar CT image reconstructions and MIPs were obtained to evaluate the vascular anatomy. RADIATION DOSE REDUCTION: This exam was performed according to the departmental dose-optimization program which includes automated exposure control, adjustment of the mA and/or kV according to patient size and/or use of iterative reconstruction technique. CONTRAST:  74m OMNIPAQUE IOHEXOL 350 MG/ML SOLN COMPARISON:  May 18, 2022. FINDINGS: Cardiovascular:  Aortic caliber is normal.  No acute aortic process. w collateral pathways about LEFT shoulder and LEFT arm likely reflects sequela of marked narrowing of the LEFT brachycephalic vein with numerous collateral pathways throughout the chest and also with filling of RIGHT brachiocephalic vein via collateral pathways  as well. This finding is unchanged compared  to previous imaging. Heart size is stable. No signs of pericardial effusion or of pericardial nodularity. Central pulmonary vasculature opacified to 248 Hounsfield units. This study is negative for pulmonary embolism to the segmental level. Mediastinum/Nodes: No thoracic inlet, axillary, mediastinal or hilar adenopathy. Esophagus grossly normal. Lungs/Pleura: No effusion. No consolidative changes. Mild basilar atelectasis. Stable small RIGHT middle lobe pulmonary nodule measuring approximately 4 mm, unchanged since August of 2022 in compatible with a benign pulmonary nodule. Airways are patent. Upper Abdomen: Incidental imaging of upper abdominal contents without acute process. Musculoskeletal: No acute musculoskeletal findings. No destructive bone lesion. Review of the MIP images confirms the above findings. IMPRESSION: 1. Negative for pulmonary embolism to the segmental level. 2. No acute pulmonary process. 3. Stable 4 mm RIGHT middle lobe pulmonary nodule, unchanged since August of 2022 in compatible with a benign pulmonary nodule. 4. 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 Electronically Signed   By: Zetta Bills M.D.   On: 05/30/2022 18:33   DG Chest 1 View  Result Date: 05/30/2022 CLINICAL DATA:  Tachycardia EXAM: CHEST  1 VIEW COMPARISON:  May 18, 2022 FINDINGS: The heart size and mediastinal contours are stable. Cardiac pacemaker is unchanged. Both lungs are clear. The lungs are hyperinflated. The visualized skeletal structures are unremarkable. IMPRESSION: No active cardiopulmonary disease. COPD. Electronically Signed   By: Abelardo Diesel M.D.   On: 05/30/2022 17:46     Assessment and Plan:   Atrial flutter with rapid ventricular rate -History of ablation November 2022 -He held his Eliquis for back surgery 2 weeks ago.  He is compliant with Eliquis since June 8.  Found to have symptomatic atrial flutter with  rapid ventricular rates on device check today -Will load and infuse with IV diltiazem.  If did not cardiovert overnight, he will need TEE guided cardioversion.  N.p.o. after midnight. -Echocardiogram September 2022 with normal LV function -Check TSH   Risk Assessment/Risk Scores:         CHA2DS2-VASc Score = 2   This indicates a 2.2% annual risk of stroke. The patient's score is based upon: CHF History: 0 HTN History: 0 Diabetes History: 0 Stroke History: 0 Vascular Disease History: 0 Age Score: 2 Gender Score: 0      Severity of Illness: The appropriate patient status for this patient is OBSERVATION. Observation status is judged to be reasonable and necessary in order to provide the required intensity of service to ensure the patient's safety. The patient's presenting symptoms, physical exam findings, and initial radiographic and laboratory data in the context of their medical condition is felt to place them at decreased risk for further clinical deterioration. Furthermore, it is anticipated that the patient will be medically stable for discharge from the hospital within 2 midnights of admission.    For questions or updates, please contact [place] Please consult [email or web address] for contact info under   [employer] - [facility]  Patient seen and examined and agree with [facility] as detailed above.  In brief, the patient is a  86 y.o. 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   Patient has  a known history of Afib and underwent ablation in 10/2021. He was doing well until following his back surgery on 05/16/22 when he developed  palpitations, chest tightness, DOE and generalized fatigue prompting him to go to the ER.   Here, the patient was noted to be in [place] with RVR. PPM interrogation revealed he had been in [place] with RVR since 05/23/22 with HR as high as 180. Labs notable for trop 9>9. BNP 258. CTA negative for PE with stable pulmonary nodule.   Currently, HR remain in 120-130s in flutter. Has been started on dilt gtt for rate control. Notably, he had been off his apixaban prior to his back surgery.Will plan to continue rate control with dilt and schedule TEE/DCCV as able unless HR improve and he can be scheduled for outpatient DCCV once he has been on 3 weeks of AC. Plan discussed with patient and family at bedside.  GEN: No acute distress.   Neck: No JVD Cardiac: Tachycardic, regular. No murmurs Respiratory: Clear to auscultation bilaterally. GI: Soft, nontender, non-distended  MS: No edema; No deformity. Neuro:  Nonfocal  Psych: Normal affect    Plan: -Start dilt for rate control -Keep NPO at [place] in case there is space in the TEE/DCCV schedule -Continue home apixaban -Pending ability to control HR, can consider continued rate control and outpatient DCCV after 3 weeks AC (missed doses for back surgery) if delay in scheduling TEE/DCCV  Gwyndolyn Kaufman, MD

## 2022-05-30 NOTE — ED Triage Notes (Signed)
Just recently  had back surgery due to a ruptured disc. But has been tachy since the weekend and cardiology wanted him to come for eval.

## 2022-05-30 NOTE — ED Notes (Signed)
Cardizem has not been titrated d/t SBP in lower 100s

## 2022-05-30 NOTE — ED Provider Triage Note (Signed)
Emergency Medicine Provider Triage Evaluation Note  Todd Salazar , a 86 y.o. male  was evaluated in triage.  Pt complains of palpitations, chest pain.  Patient underwent back surgery for ruptured disc approximately 1-1/2 weeks ago.  Noticed for the past 4 days that he was having palpitations and pain.  Has a history of A-fib and underwent an ablation approximately 6 months ago.  No recent changes to medications.  Reports has been taking Tylenol and Motrin for postop pain and decreasing use of opioids.  Review of Systems  Positive: Palpitations, chest pain Negative: Fever  Physical Exam  BP 118/72 (BP Location: Right Arm)   Pulse (!) 134   Temp 98.1 F (36.7 C)   Resp 16   Ht '6\' 3"'$  (1.905 m)   Wt 85.7 kg   SpO2 99%   BMI 23.62 kg/m  Gen:   Awake, no distress   Resp:  Normal effort  MSK:   Moves extremities without difficulty  Other:  Patient tachycardic, appears regular  Medical Decision Making  Medically screening exam initiated at 3:27 PM.  Appropriate orders placed.  Todd Salazar was informed that the remainder of the evaluation will be completed by another provider, this initial triage assessment does not replace that evaluation, and the importance of remaining in the ED until their evaluation is complete.  We will order labs, EKG.  Informed charge nurse that patient will need room next.   Delia Heady, PA-C 05/30/22 1530

## 2022-05-30 NOTE — ED Provider Notes (Signed)
[facility] EMERGENCY DEPARTMENT Provider Note   CSN: [id number] Arrival date & time: 05/30/22  1459     History  Chief Complaint  Patient presents with   Tachycardia    Todd Salazar is a 86 y.o. male.  86 year old with a past medical history of sick sinus syndrome s/p PPM, paroxysmal atrial fibrillation on Eliquis s/p ablation, CKD stage IIIa, osteoarthritis s/p bilateral total hip arthroplasty, prostate cancer presents to the ED with several days of generalized fatigue, dizziness, and reported palpitations.  Patient is 2 weeks s/p lumbar decompression fusion at L3-L4 and completed an Eliquis washout prior to this.  Since leaving the hospital, he has resumed his Eliquis and states that his mobility has been improving.  However, throughout the weekend, he developed episodes of left-sided, nonradiating chest pain with associated dyspnea on exertion and palpitations.  Daughter-in-law at bedside states that the patient will have heart rates as high as 145.  Patient endorses intermittent dizziness and lightheadedness which resolves when he lies down.  He also has been nauseous and has poor PO intake.  Today, they were contacted by the patient's cardiologist who advised him to present to the emergency room as his remote device check revealed that he was in atrial fibrillation.  He denies associated fevers, chills, or cough.  He states that he has been healing well from his lumbar surgery and denies back pain at this time.  The history is provided by the patient and a relative.       Home Medications Prior to Admission medications   Medication Sig Start Date End Date Taking? Authorizing Provider  ADVIL 200 MG CAPS Take 200-400 mg by mouth every 6 (six) hours as needed (for headaches or mild pain).   Yes [provider]  apixaban (ELIQUIS) 5 MG TABS tablet Take 1 tablet (5 mg total) by mouth 2 (two) times daily. 03/18/22  Yes Camnitz, Will Hassell Done, MD  gabapentin  (NEURONTIN) 100 MG capsule Take 200 mg by mouth at bedtime. 01/24/22  Yes [provider]  polyethylene glycol (MIRALAX / GLYCOLAX) 17 g packet Take 17 g by mouth daily as needed for mild constipation. Patient taking differently: Take 17 g by mouth daily as needed for mild constipation (mix and drink). 04/21/22  Yes [place] Eric J, DO  promethazine (PHENERGAN) 12.5 MG tablet Take 1 tablet (12.5 mg total) by mouth every 6 (six) hours as needed for nausea or vomiting. 04/21/22  Yes [place] Eric J, DO  tiZANidine (ZANAFLEX) 2 MG tablet Take 1 tablet (2 mg total) by mouth every 6 (six) hours as needed for muscle spasms. 04/21/22  Yes [place] Donnamarie Poag, DO      Allergies    Ciprofloxacin, Tramadol, Duloxetine, Hydrocodone-acetaminophen, Nisoldipine, Benadryl [diphenhydramine], Methocarbamol, Ondansetron hcl, and Sulfa antibiotics    Review of Systems   Review of Systems  Constitutional:  Positive for fatigue. Negative for chills and fever.  Respiratory:  Positive for shortness of breath. Negative for cough.   Cardiovascular:  Positive for chest pain.  Gastrointestinal:  Positive for constipation, nausea and vomiting. Negative for abdominal pain.  Musculoskeletal:  Negative for back pain.  Neurological:  Positive for dizziness and light-headedness.    Physical Exam Updated Vital Signs BP 101/69   Pulse (!) 129   Temp 98.1 F (36.7 C)   Resp 20   Ht '6\' 3"'$  (1.905 m)   Wt 85.7 kg   SpO2 96%   BMI 23.62 kg/m  Physical Exam Vitals and nursing  note reviewed.  Constitutional:      General: He is not in acute distress.    Appearance: Normal appearance. He is well-developed. He is not ill-appearing.  HENT:     Head: Normocephalic.  Cardiovascular:     Rate and Rhythm: Regular rhythm. Tachycardia present.     Pulses:          Radial pulses are 2+ on the right side and 2+ on the left side.     Heart sounds: Murmur heard.     Systolic murmur is present with a grade of 2/6.  Pulmonary:      Effort: Pulmonary effort is normal. No respiratory distress.     Breath sounds: Normal breath sounds. No rhonchi or rales.  Abdominal:     Palpations: Abdomen is soft.     Tenderness: There is no abdominal tenderness. There is no guarding.  Musculoskeletal:     Right lower leg: No edema.     Left lower leg: No edema.  Skin:    General: Skin is warm and dry.  Neurological:     Mental Status: He is alert.     ED Results / Procedures / Treatments   Labs (all labs ordered are listed, but only abnormal results are displayed) Labs Reviewed  BASIC METABOLIC PANEL - Abnormal; Notable for the following components:      Result Value   GFR, Estimated 58 (*)    All other components within normal limits  CBC WITH DIFFERENTIAL/PLATELET - Abnormal; Notable for the following components:   RBC 3.28 (*)    Hemoglobin 10.8 (*)    HCT 34.9 (*)    MCV 106.4 (*)    RDW 23.8 (*)    Platelets 447 (*)    nRBC 0.4 (*)    All other components within normal limits  BRAIN NATRIURETIC PEPTIDE - Abnormal; Notable for the following components:   B Natriuretic Peptide 256.1 (*)    All other components within normal limits  HEPATIC FUNCTION PANEL - Abnormal; Notable for the following components:   Total Bilirubin 1.7 (*)    Bilirubin, Direct 0.4 (*)    Indirect Bilirubin 1.3 (*)    All other components within normal limits  MAGNESIUM  LIPOPROTEIN A (LPA)  TSH  BASIC METABOLIC PANEL  CBC  TROPONIN I (HIGH SENSITIVITY)  TROPONIN I (HIGH SENSITIVITY)    EKG EKG Interpretation  Date/Time:  Monday May 30 2022 15:16:15 EDT Ventricular Rate:  134 PR Interval:  56 QRS Duration: 88 QT Interval:  346 QTC Calculation: 516 R Axis:   55 Text Interpretation: Sinus tachycardia with short PR Septal infarct , age undetermined Abnormal ECG When compared with ECG of 30-May-2022 15:15, PREVIOUS ECG IS PRESENT Confirmed by Elnora Morrison [phone]) on 05/30/2022 4:29:04 PM  Radiology CT Angio Chest PE W  and/or Wo Contrast  Result Date: 05/30/2022 CLINICAL DATA:  A male age 25 presents for evaluation of tachycardia and suspicion for pulmonary embolism. EXAM: CT ANGIOGRAPHY CHEST WITH CONTRAST TECHNIQUE: Multidetector CT imaging of the chest was performed using the standard protocol during bolus administration of intravenous contrast. Multiplanar CT image reconstructions and MIPs were obtained to evaluate the vascular anatomy. RADIATION DOSE REDUCTION: This exam was performed according to the departmental dose-optimization program which includes automated exposure control, adjustment of the mA and/or kV according to patient size and/or use of iterative reconstruction technique. CONTRAST:  65m OMNIPAQUE IOHEXOL 350 MG/ML SOLN COMPARISON:  May 18, 2022. FINDINGS: Cardiovascular:  Aortic caliber is normal.  No acute aortic process. w collateral pathways about LEFT shoulder and LEFT arm likely reflects sequela of marked narrowing of the LEFT brachycephalic vein with numerous collateral pathways throughout the chest and also with filling of RIGHT brachiocephalic vein via collateral pathways as well. This finding is unchanged compared to previous imaging. Heart size is stable. No signs of pericardial effusion or of pericardial nodularity. Central pulmonary vasculature opacified to 248 Hounsfield units. This study is negative for pulmonary embolism to the segmental level. Mediastinum/Nodes: No thoracic inlet, axillary, mediastinal or hilar adenopathy. Esophagus grossly normal. Lungs/Pleura: No effusion. No consolidative changes. Mild basilar atelectasis. Stable small RIGHT middle lobe pulmonary nodule measuring approximately 4 mm, unchanged since August of 2022 in compatible with a benign pulmonary nodule. Airways are patent. Upper Abdomen: Incidental imaging of upper abdominal contents without acute process. Musculoskeletal: No acute musculoskeletal findings. No destructive bone lesion. Review of the MIP images confirms  the above findings. IMPRESSION: 1. Negative for pulmonary embolism to the segmental level. 2. No acute pulmonary process. 3. Stable 4 mm RIGHT middle lobe pulmonary nodule, unchanged since August of 2022 in compatible with a benign pulmonary nodule. 4. 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 Electronically Signed   By: Zetta Bills M.D.   On: 05/30/2022 18:33   DG Chest 1 View  Result Date: 05/30/2022 CLINICAL DATA:  Tachycardia EXAM: CHEST  1 VIEW COMPARISON:  May 18, 2022 FINDINGS: The heart size and mediastinal contours are stable. Cardiac pacemaker is unchanged. Both lungs are clear. The lungs are hyperinflated. The visualized skeletal structures are unremarkable. IMPRESSION: No active cardiopulmonary disease. COPD. Electronically Signed   By: Abelardo Diesel M.D.   On: 05/30/2022 17:46    Procedures Procedures    Medications Ordered in ED Medications  diltiazem (CARDIZEM) 1 mg/mL load via infusion 10 mg (10 mg Intravenous Bolus from Bag 05/30/22 2013)    And  diltiazem (CARDIZEM) 125 mg in dextrose 5% 125 mL (1 mg/mL) infusion (5 mg/hr Intravenous New Bag/Given 05/30/22 2013)  apixaban (ELIQUIS) tablet 5 mg (5 mg Oral Given 05/30/22 2012)  gabapentin (NEURONTIN) capsule 100 mg (has no administration in time range)  tiZANidine (ZANAFLEX) tablet 2 mg (has no administration in time range)  nitroGLYCERIN (NITROSTAT) SL tablet 0.4 mg (has no administration in time range)  lactated ringers bolus 1,000 mL (0 mLs Intravenous Stopped 05/30/22 2015)  iohexol (OMNIPAQUE) 350 MG/ML injection 80 mL (80 mLs Intravenous Contrast Given 05/30/22 1803)    ED Course/ Medical Decision Making/ A&P                           Medical Decision Making Amount and/or Complexity of Data Reviewed Independent Historian: caregiver External Data Reviewed: labs, ECG and notes. Labs: ordered. Radiology: ordered and independent  interpretation performed. ECG/medicine tests: ordered and independent interpretation performed.  Risk Prescription drug management. Decision regarding hospitalization.   86 year old male with history as above presents with near syncope in the setting of episodes of A-fib with RVR.  On arrival, patient is well-appearing, tachycardic but normotensive.  Initial EKG with regular, narrow complex tachycardia, possibly sinus tachycardia vs atrial flutter at 134 bpm without acute ischemic findings.  I reviewed the telephone notes which were concerning the patient's remote device interrogation.  Per the notes, patient had been in atrial fibrillation with RVR since June 16 which is possibly the etiology of his symptoms today.  I  reviewed and interpreted the patient's labs which are largely reassuring.  CBC without significant leukocytosis or leukopenia.  No evidence for new anemia.  CMP without significant electrolyte abnormalities, renal function appears stable. Troponin stable at 9 -> 9.  Chest x-ray also obtained without focal airspace consolidations to support pneumonia, no evidence of pulmonary edema.  We additionally expressed concern for possible PE given his Eliquis washout and recent surgery.  CTA PE study was obtained and negative for thromboembolic disease.  We additionally reinterrogated the patient's pacemaker which was notable for multiple episodes of a flutter with RVR over the last several days, with highest heart rate being in the 180s.  I discussed these findings with the cardiology team on-call.  Cardiology evaluated patient in the emergency department and will plan for admission for further management.  Plan of care has been discussed with the patient and family members at bedside.  No further acute events or interventions prior to transition of care to the inpatient team.  Final Clinical Impression(s) / ED Diagnoses Final diagnoses:  Atrial flutter with rapid ventricular response [facility])   Palpitations    Rx / DC Orders ED Discharge Orders     None         Cuthbert Turton, [place], MD 05/30/22 8675    Elnora Morrison, MD 05/30/22 2340

## 2022-05-31 ENCOUNTER — Encounter (HOSPITAL_COMMUNITY): Payer: Self-pay | Admitting: Cardiology

## 2022-05-31 DIAGNOSIS — I4892 Unspecified atrial flutter: Secondary | ICD-10-CM | POA: Diagnosis not present

## 2022-05-31 LAB — CBC
HCT: 28.9 % — ABNORMAL LOW (ref 39.0–52.0)
Hemoglobin: 9.6 g/dL — ABNORMAL LOW (ref 13.0–17.0)
MCH: 33.8 pg (ref 26.0–34.0)
MCHC: 33.2 g/dL (ref 30.0–36.0)
MCV: 101.8 fL — ABNORMAL HIGH (ref 80.0–100.0)
Platelets: 384 10*3/uL (ref 150–400)
RBC: 2.84 MIL/uL — ABNORMAL LOW (ref 4.22–5.81)
RDW: 23.9 % — ABNORMAL HIGH (ref 11.5–15.5)
WBC: 5.2 10*3/uL (ref 4.0–10.5)
nRBC: 0 % (ref 0.0–0.2)

## 2022-05-31 LAB — BASIC METABOLIC PANEL
Anion gap: 6 (ref 5–15)
BUN: 13 mg/dL (ref 8–23)
CO2: 24 mmol/L (ref 22–32)
Calcium: 9 mg/dL (ref 8.9–10.3)
Chloride: 109 mmol/L (ref 98–111)
Creatinine, Ser: 1.26 mg/dL — ABNORMAL HIGH (ref 0.61–1.24)
GFR, Estimated: 56 mL/min — ABNORMAL LOW (ref >60)
Glucose, Bld: 106 mg/dL — ABNORMAL HIGH (ref 70–99)
Potassium: 4.3 mmol/L (ref 3.5–5.1)
Sodium: 139 mmol/L (ref 135–145)

## 2022-05-31 LAB — TSH: TSH: 0.946 u[IU]/mL (ref 0.350–4.500)

## 2022-05-31 MED ORDER — ACETAMINOPHEN 325 MG PO TABS
650.0000 mg | ORAL_TABLET | Freq: Four times a day (QID) | ORAL | Status: DC | PRN
  Administered 2022-05-31: 650 mg via ORAL
  Filled 2022-05-31: qty 2

## 2022-05-31 MED ORDER — ENSURE ENLIVE PO LIQD: 237.0000 mL | Freq: Two times a day (BID) | ORAL | Status: DC

## 2022-05-31 MED ORDER — BOOST / RESOURCE BREEZE PO LIQD CUSTOM: 1.0000 | Freq: Three times a day (TID) | ORAL | Status: DC

## 2022-05-31 MED ORDER — ADULT MULTIVITAMIN W/MINERALS CH
1.0000 | ORAL_TABLET | Freq: Every day | ORAL | Status: DC
  Administered 2022-05-31: 1 via ORAL
  Filled 2022-05-31 (×2): qty 1

## 2022-05-31 NOTE — ED Notes (Signed)
This RN called 4E about bringing pt up, per floor he is good to transfer up.

## 2022-05-31 NOTE — Progress Notes (Signed)
Admitted to unit via stretcher. Pt ambulatory to bed. States he uses walker at baseline due to recent back surgery and instability. Oriented to room, staff and surroundings. Denies pain, SOB. ST with rate in 110-120s. Cardiazem gtt infusing @ 27m. Per ED nurse, gtt not titrated due to soft BP. Skin assessment completed with Red, RN and head to toe assessment complete. Belongings--clothing and wallet--placed in closet, phone at bedside. Bed low, wheels locked, and call bell in reach.   0615-Cardiazem titrated to 7.5 mL/hr 2/2 HR 130s. BP 108/79. Patient with no acute distress. Continues to deny pain, SOB.

## 2022-05-31 NOTE — Progress Notes (Signed)
Initial Nutrition Assessment  DOCUMENTATION CODES:   Severe malnutrition in context of acute illness/injury  INTERVENTION:   Boost Breeze po TID, each supplement provides 250 kcal and 9 grams of protein. Can mix with ginger ale if patient prefers.  D/C Ensure, patient does not like.  MVI with minerals daily.  NUTRITION DIAGNOSIS:   Severe Malnutrition related to acute illness (recent back surgery) as evidenced by severe muscle depletion, severe fat depletion, percent weight loss (8% weight loss within 3 months).  GOAL:   Patient will meet greater than or equal to 90% of their needs  MONITOR:   PO intake, Supplement acceptance, Labs  REASON FOR ASSESSMENT:   Malnutrition Screening Tool    ASSESSMENT:   86 yo male admitted with A flutter with RVR. PMH includes arthritis, A fib/flutter S/P ablation 10/2021), bronchiectasis with restrictive lung disease, pacemaker, CKD stage III, prostate cancer, kidney stones, recent back surgery (05/16/22).   Spoke with patient, his son, and daughter in law at bedside. Patient had back surgery approximately 2 weeks ago and has been eating poorly since then. He states that his taste has not returned to normal yet. His appetite is not great either. He typically weighs in the 190's and has lost to the 170's. Weight history reviewed. 8% weight loss within the past 3 months is significant for the time frame. Patient does not like Ensure or magic cup supplements. RD provided and he tried [employer]. After mixing with ginger ale, he agreed to continue to sip on it throughout the day.   Labs and medications reviewed.  Patient meets criteria for severe malnutrition, given severe depletion of muscle and subcutaneous fat mass with 8% weight loss within 3 months.   NUTRITION - FOCUSED PHYSICAL EXAM:  Flowsheet Row Most Recent Value  Orbital Region Severe depletion  Upper Arm Region Moderate depletion  Thoracic and Lumbar Region Moderate depletion   Buccal Region Moderate depletion  Temple Region Severe depletion  Clavicle Bone Region Severe depletion  Clavicle and Acromion Bone Region Severe depletion  Scapular Bone Region Moderate depletion  Dorsal Hand Moderate depletion  Patellar Region Moderate depletion  Anterior Thigh Region Moderate depletion  Posterior Calf Region Moderate depletion  Edema (RD Assessment) Mild  Hair Reviewed  Eyes Reviewed  Mouth Reviewed  Skin Reviewed  Nails Reviewed       Diet Order:   Diet Order             Diet NPO time specified  Diet effective midnight           Diet regular Room service appropriate? Yes; Fluid consistency: Thin  Diet effective now                   EDUCATION NEEDS:   Education needs have been addressed  Skin:  Skin Assessment: Reviewed RN Assessment  Last BM:  6/19  Height:   Ht Readings from Last 1 Encounters:  05/31/22 '6\' 3"'$  (1.905 m)    Weight:   Wt Readings from Last 1 Encounters:  05/31/22 79.3 kg    Ideal Body Weight:  89.1 kg  BMI:  Body mass index is 21.86 kg/m.  Estimated Nutritional Needs:   Kcal:  2300-2500  Protein:  120-140 gm  Fluid:  2.3 L    Lucas Mallow RD, LDN, CNSC Please refer to Amion for contact information.

## 2022-05-31 NOTE — Progress Notes (Addendum)
Progress Note  Patient Name: Todd Salazar Date of Encounter: 05/31/2022  [facility] Cardiologist: None Dr. Curt Bears  Subjective   Fairly comfortable but heart rate is still elevated.  Inpatient Medications    Scheduled Meds:  apixaban  5 mg Oral BID   feeding supplement  237 mL Oral BID BM   Continuous Infusions:  diltiazem (CARDIZEM) infusion 7.5 mg/hr (05/31/22 0614)   PRN Meds: gabapentin, nitroGLYCERIN, tiZANidine   Vital Signs    Vitals:   05/31/22 0400 05/31/22 0500 05/31/22 0600 05/31/22 0810  BP:   108/79 101/74  Pulse: (!) 128 (!) 128 (!) 130 (!) 128  Resp: '15 16  18  '$ Temp:    98.5 F (36.9 C)  TempSrc:    Oral  SpO2: 98% 96%  97%  Weight:      Height:       No intake or output data in the 24 hours ending 05/31/22 1054    05/31/2022   12:34 AM 05/30/2022    3:19 PM 04/15/2022   10:02 PM  Last 3 Weights  Weight (lbs) 174 lb 14.4 oz 189 lb 189 lb 9.5 oz  Weight (kg) 79.334 kg 85.73 kg 86 kg      Telemetry    Atrial flutter elevated heart rates in the 130s.- Personally Reviewed  ECG    Atrial flutter with rapid ventricular response- Personally Reviewed  Physical Exam   GEN: No acute distress.  Elderly Neck: No JVD Cardiac: Tachycardic fairly regular, no murmurs, rubs, or gallops.  Pacemaker Respiratory: Clear to auscultation bilaterally. GI: Soft, nontender, non-distended  MS: No edema; No deformity. Neuro:  Nonfocal  Psych: Normal affect   Labs    High Sensitivity Troponin:   Recent Labs  Lab 05/30/22 1534 05/30/22 1740  TROPONINIHS 9 9     Chemistry Recent Labs  Lab 05/30/22 1534 05/30/22 1740 05/31/22 0205  NA 141  --  139  K 4.4  --  4.3  CL 109  --  109  CO2 22  --  24  GLUCOSE 94  --  106*  BUN 16  --  13  CREATININE 1.22  --  1.26*  CALCIUM 9.2  --  9.0  MG  --  2.1  --   PROT  --  6.7  --   ALBUMIN  --  3.7  --   AST  --  28  --   ALT  --  19  --   ALKPHOS  --  77  --   BILITOT  --  1.7*  --   GFRNONAA  58*  --  56*  ANIONGAP 10  --  6    Lipids No results for input(s): "CHOL", "TRIG", "HDL", "LABVLDL", "LDLCALC", "CHOLHDL" in the last 168 hours.  Hematology Recent Labs  Lab 05/30/22 1534 05/31/22 0205  WBC 5.4 5.2  RBC 3.28* 2.84*  HGB 10.8* 9.6*  HCT 34.9* 28.9*  MCV 106.4* 101.8*  MCH 32.9 33.8  MCHC 30.9 33.2  RDW 23.8* 23.9*  PLT 447* 384   Thyroid  Recent Labs  Lab 05/31/22 0205  TSH 0.946    BNP Recent Labs  Lab 05/30/22 1740  BNP 256.1*    DDimer No results for input(s): "DDIMER" in the last 168 hours.   Radiology    CT Angio Chest PE W and/or Wo Contrast  Result Date: 05/30/2022 CLINICAL DATA:  A male age 86 presents for evaluation of tachycardia and suspicion for pulmonary embolism. EXAM: CT  ANGIOGRAPHY CHEST WITH CONTRAST TECHNIQUE: Multidetector CT imaging of the chest was performed using the standard protocol during bolus administration of intravenous contrast. Multiplanar CT image reconstructions and MIPs were obtained to evaluate the vascular anatomy. RADIATION DOSE REDUCTION: This exam was performed according to the departmental dose-optimization program which includes automated exposure control, adjustment of the mA and/or kV according to patient size and/or use of iterative reconstruction technique. CONTRAST:  56m OMNIPAQUE IOHEXOL 350 MG/ML SOLN COMPARISON:  May 18, 2022. FINDINGS: Cardiovascular:  Aortic caliber is normal.  No acute aortic process. w collateral pathways about LEFT shoulder and LEFT arm likely reflects sequela of marked narrowing of the LEFT brachycephalic vein with numerous collateral pathways throughout the chest and also with filling of RIGHT brachiocephalic vein via collateral pathways as well. This finding is unchanged compared to previous imaging. Heart size is stable. No signs of pericardial effusion or of pericardial nodularity. Central pulmonary vasculature opacified to 248 Hounsfield units. This study is negative for pulmonary  embolism to the segmental level. Mediastinum/Nodes: No thoracic inlet, axillary, mediastinal or hilar adenopathy. Esophagus grossly normal. Lungs/Pleura: No effusion. No consolidative changes. Mild basilar atelectasis. Stable small RIGHT middle lobe pulmonary nodule measuring approximately 4 mm, unchanged since August of 2022 in compatible with a benign pulmonary nodule. Airways are patent. Upper Abdomen: Incidental imaging of upper abdominal contents without acute process. Musculoskeletal: No acute musculoskeletal findings. No destructive bone lesion. Review of the MIP images confirms the above findings. IMPRESSION: 1. Negative for pulmonary embolism to the segmental level. 2. No acute pulmonary process. 3. Stable 4 mm RIGHT middle lobe pulmonary nodule, unchanged since August of 2022 in compatible with a benign pulmonary nodule. 4. 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 Electronically Signed   By: GZetta BillsM.D.   On: 05/30/2022 18:33   DG Chest 1 View  Result Date: 05/30/2022 CLINICAL DATA:  Tachycardia EXAM: CHEST  1 VIEW COMPARISON:  May 18, 2022 FINDINGS: The heart size and mediastinal contours are stable. Cardiac pacemaker is unchanged. Both lungs are clear. The lungs are hyperinflated. The visualized skeletal structures are unremarkable. IMPRESSION: No active cardiopulmonary disease. COPD. Electronically Signed   By: WAbelardo DieselM.D.   On: 05/30/2022 17:46    Cardiac Studies   EF 60% Coronary calcium score 82 which is 16th percentile. Negative PE  Patient Profile     86y.o. male with paroxysmal atrial fibrillation and flutter status post [facility] 20[facility], orthostatic hypotension prostate cancer chronic kidney disease stage IIIa.  Presented with chest tightness fatigue over the past week and was found to be in atrial flutter with RVR upon device interrogation.  Went to  the emergency room.  Assessment & Plan    Paroxysmal atrial flutter - Post atrial fibrillation ablation. -He is on for TEE cardioversion tomorrow at 1 PM.  Risk and benefits have been discussed, willing to proceed.  Family in room. -Continue with IV diltiazem for now.  Atrial flutter is challenging to control.  Lots of questions answered. -Continue with Eliquis. -Difficult to rate control.  [place] - Functioning well.  Orthostatic hypotension - Stable  Recent back surgery - Inflammation may have triggered atrial flutter.  -He may eat today.  We will make n.p.o. tomorrow.  For questions or updates, please contact CSierra VillagePlease consult [email or web address] for contact info under        Signed, MCandee Furbish MD  05/31/2022, 10:54 AM

## 2022-06-01 ENCOUNTER — Inpatient Hospital Stay (HOSPITAL_COMMUNITY): Payer: PPO | Admitting: Certified Registered Nurse Anesthetist

## 2022-06-01 ENCOUNTER — Encounter (HOSPITAL_COMMUNITY): Payer: Self-pay | Admitting: Cardiology

## 2022-06-01 ENCOUNTER — Inpatient Hospital Stay (HOSPITAL_COMMUNITY): Payer: PPO

## 2022-06-01 ENCOUNTER — Encounter (HOSPITAL_COMMUNITY): Payer: Self-pay

## 2022-06-01 ENCOUNTER — Ambulatory Visit (HOSPITAL_COMMUNITY): Admission: RE | Admit: 2022-06-01 | Payer: PPO | Source: Ambulatory Visit

## 2022-06-01 ENCOUNTER — Encounter (HOSPITAL_COMMUNITY)
Admission: EM | Disposition: A | Discharge status: Home / Self Care | Payer: Self-pay | Source: Home / Self Care | Attending: Cardiology

## 2022-06-01 DIAGNOSIS — I2510 Atherosclerotic heart disease of native coronary artery without angina pectoris: Secondary | ICD-10-CM

## 2022-06-01 DIAGNOSIS — I361 Nonrheumatic tricuspid (valve) insufficiency: Secondary | ICD-10-CM | POA: Diagnosis not present

## 2022-06-01 DIAGNOSIS — I4892 Unspecified atrial flutter: Secondary | ICD-10-CM | POA: Diagnosis not present

## 2022-06-01 DIAGNOSIS — I4891 Unspecified atrial fibrillation: Secondary | ICD-10-CM

## 2022-06-01 DIAGNOSIS — I10 Essential (primary) hypertension: Secondary | ICD-10-CM

## 2022-06-01 DIAGNOSIS — F419 Anxiety disorder, unspecified: Secondary | ICD-10-CM

## 2022-06-01 HISTORY — PX: CARDIOVERSION: SHX1299

## 2022-06-01 HISTORY — PX: TEE WITHOUT CARDIOVERSION: SHX5443

## 2022-06-01 LAB — LIPOPROTEIN A (LPA): Lipoprotein (a): 36.7 nmol/L — ABNORMAL HIGH (ref <75.0)

## 2022-06-01 SURGERY — ECHOCARDIOGRAM, TRANSESOPHAGEAL
Anesthesia: Monitor Anesthesia Care

## 2022-06-01 MED ORDER — PHENYLEPHRINE 80 MCG/ML (10ML) SYRINGE FOR IV PUSH (FOR BLOOD PRESSURE SUPPORT)
PREFILLED_SYRINGE | INTRAVENOUS | Status: DC | PRN
  Administered 2022-06-01: 200 ug via INTRAVENOUS
  Administered 2022-06-01: 160 ug via INTRAVENOUS
  Administered 2022-06-01: 120 ug via INTRAVENOUS
  Administered 2022-06-01: 200 ug via INTRAVENOUS

## 2022-06-01 MED ORDER — SODIUM CHLORIDE 0.9 % IV SOLN
INTRAVENOUS | Status: AC | PRN
  Administered 2022-06-01: 500 mL via INTRAVENOUS

## 2022-06-01 MED ORDER — PROPOFOL 500 MG/50ML IV EMUL
INTRAVENOUS | Status: DC | PRN
  Administered 2022-06-01: 100 ug/kg/min via INTRAVENOUS

## 2022-06-01 MED ORDER — PROPOFOL 10 MG/ML IV BOLUS
INTRAVENOUS | Status: DC | PRN
  Administered 2022-06-01: 90 mg via INTRAVENOUS

## 2022-06-01 MED ORDER — LIDOCAINE 2% (20 MG/ML) 5 ML SYRINGE
INTRAMUSCULAR | Status: DC | PRN
  Administered 2022-06-01: 100 mg via INTRAVENOUS

## 2022-06-01 MED ORDER — SODIUM CHLORIDE 0.9 % IV SOLN: INTRAVENOUS | Status: DC

## 2022-06-01 NOTE — CV Procedure (Addendum)
   TRANSESOPHAGEAL ECHOCARDIOGRAM GUIDED DIRECT CURRENT CARDIOVERSION  NAME:  Todd Salazar   MRN: [id number] DOB:  10/18/1936   ADMIT DATE: 05/30/2022  INDICATIONS: Symptomatic atrial flutter  PROCEDURE:   Informed consent was obtained prior to the procedure. The risks, benefits and alternatives for the procedure were discussed and the patient comprehended these risks.  Risks include, but are not limited to, cough, sore throat, vomiting, nausea, somnolence, esophageal and stomach trauma or perforation, bleeding, low blood pressure, aspiration, pneumonia, infection, trauma to the teeth and death.    After a procedural time-out, the oropharynx was anesthetized and the patient was sedated by the anesthesia service. The transesophageal probe was inserted in the esophagus and stomach without difficulty and multiple views were obtained. Anesthesia was monitored by Dr. Gloris Manchester and Juanda Chance, CRNA.   COMPLICATIONS:    Complications: No complications Patient tolerated procedure well.  FINDINGS:  No LAA thrombus   CARDIOVERSION:     Indications:  Symptomatic Atrial Flutter  Procedure Details:  Once the TEE was complete, the patient had the defibrillator pads placed in the anterior and posterior position. Once an appropriate level of sedation was confirmed, the patient was cardioverted x 1 with 100J of biphasic synchronized energy.  The patient converted to A-paced rhythm with rate 60s.  BP down to 80s post cardioversion, cardizem gtt was discontinued. BP improved to 100s.  The patient had normal neuro status and respiratory status post procedure with vitals stable as recorded elsewhere.  Adequate airway was maintained throughout and vital signs monitored per protocol.  Oswaldo Milian MD [facility]  [address] [place], [place] [address] [phone]   12:56 PM

## 2022-06-01 NOTE — Anesthesia Preprocedure Evaluation (Addendum)
Anesthesia Evaluation  Patient identified by MRN, date of birth, ID band Patient awake    Reviewed: Allergy & Precautions, NPO status , Patient's Chart, lab work & pertinent test results  Airway Mallampati: II  TM Distance: >3 FB     Dental  (+) Edentulous Upper, Edentulous Lower, Dental Advisory Given   Pulmonary neg pulmonary ROS, former smoker,    Pulmonary exam normal        Cardiovascular hypertension, + CAD  + dysrhythmias (on eliquis) Atrial Fibrillation  Rhythm:Irregular Rate:Normal  TTE 2022 1. Left ventricular ejection fraction, by estimation, is 55 to 60%. The  left ventricle has normal function. The left ventricle has no regional  wall motion abnormalities. There is mild left ventricular hypertrophy.  Left ventricular diastolic parameters  were normal.  2. Right ventricular systolic function is normal. The right ventricular  size is mildly enlarged. There is normal pulmonary artery systolic  pressure. The estimated right ventricular systolic pressure is 31.5 mmHg.  3. Right atrial size was mildly dilated.  4. The mitral valve is normal in structure. Trivial mitral valve  regurgitation.  5. Tricuspid valve regurgitation is mild to moderate.  6. The aortic valve is tricuspid. Aortic valve regurgitation is not  visualized. No aortic stenosis is present.  7. The inferior vena cava is normal in size with greater than 50%  respiratory variability, suggesting right atrial pressure of 3 mmHg.    Neuro/Psych PSYCHIATRIC DISORDERS Anxiety negative neurological ROS     GI/Hepatic negative GI ROS, Neg liver ROS,   Endo/Other  negative endocrine ROS  Renal/GU Renal InsufficiencyRenal diseaseLab Results      Component                Value               Date                      CREATININE               1.26 (H)            05/31/2022                BUN                      13                  05/31/2022                 NA                       139                 05/31/2022                K                        4.3                 05/31/2022                CL                       109                 05/31/2022  CO2                      24                  05/31/2022             negative genitourinary   Musculoskeletal negative musculoskeletal ROS (+)   Abdominal Normal abdominal exam  (+)   Peds  Hematology  (+) Blood dyscrasia, anemia , Lab Results      Component                Value               Date                      WBC                      5.2                 05/31/2022                HGB                      9.6 (L)             05/31/2022                HCT                      28.9 (L)            05/31/2022                MCV                      101.8 (H)           05/31/2022                PLT                      384                 05/31/2022              Anesthesia Other Findings   Reproductive/Obstetrics                           Anesthesia Physical Anesthesia Plan  ASA: 3  Anesthesia Plan: MAC   Post-op Pain Management:    Induction: Intravenous  PONV Risk Score and Plan: Propofol infusion and Treatment may vary due to age or medical condition  Airway Management Planned: Natural Airway  Additional Equipment:   Intra-op Plan:   Post-operative Plan:   Informed Consent: I have reviewed the patients History and Physical, chart, labs and discussed the procedure including the risks, benefits and alternatives for the proposed anesthesia with the patient or authorized representative who has indicated his/her understanding and acceptance.     Dental advisory given  Plan Discussed with: CRNA  Anesthesia Plan Comments:         Anesthesia Quick Evaluation

## 2022-06-01 NOTE — Interval H&P Note (Signed)
History and Physical Interval Note:  06/01/2022 12:18 PM  Todd Salazar  has presented today for surgery, with the diagnosis of AFIB.  The various methods of treatment have been discussed with the patient and family. After consideration of risks, benefits and other options for treatment, the patient has consented to  Procedure(s): TRANSESOPHAGEAL ECHOCARDIOGRAM (TEE) (N/A) CARDIOVERSION (N/A) as a surgical intervention.  The patient's history has been reviewed, patient examined, no change in status, stable for surgery.  I have reviewed the patient's chart and labs.  Questions were answered to the patient's satisfaction.     Donato Heinz

## 2022-06-01 NOTE — Discharge Summary (Addendum)
Discharge Summary    Patient ID: Todd Salazar MRN: [id number]; DOB: Apr 18, 1936  Admit date: 05/30/2022 Discharge date: 06/01/2022  PCP:  [name], DO   [facility] Providers Cardiologist:  None  Electrophysiologist:  Will Meredith Leeds, MD     Discharge Diagnoses    Principal Problem:   Atrial flutter with rapid ventricular response [facility])    Diagnostic Studies/Procedures    TEE 06/01/22   1. Left ventricular ejection fraction, by estimation, is 50 to 55%. The  left ventricle has low normal function.   2. Right ventricular systolic function is normal. The right ventricular  size is normal.   3. No left atrial/left atrial appendage thrombus was detected.   4. Right atrial size was mildly dilated.   5. The mitral valve is normal in structure. Trivial mitral valve  regurgitation.   6. Tricuspid valve regurgitation is mild to moderate.   7. The aortic valve is tricuspid. Aortic valve regurgitation is not  visualized.   Conclusion(s)/Recommendation(s): No LA/LAA thrombus identified. Successful  cardioversion performed.  _____________   History of Present Illness     Todd Salazar is a 86 y.o. 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  Per chart review, patient was admitted to the hospital in September 2022 for atrial fibrillation with RVR.  He was loaded with dofetilide and underwent cardioversion.  Later underwent ablation in November 2022.  Following ablation, he was treated with colchicine for post ablation pericarditis.  Patient was last seen by Dr. Curt Bears in April 2023.  Mr. Skorupski underwent back surgery on June 5.  His Eliquis was held pre and postoperatively.  Resumed his home Eliquis since  June 8.  On device check, patient was noted to be in atrial fibrillation with RVR starting on 6/16.  Patient was symptomatic and experiencing palpitations, shortness of breath, dizziness, fatigue.  Patient was instructed to come to the ER for further evaluation.  EKG in the ER showed atrial flutter with a heart rate of 134 bpm.  Cardiology was asked to admit patient.  Hospital Course     Consultants: None    Paroxysmal atrial flutter - Post atrial fibrillation ablation in November 2022 with [facility] pacemaker - Patient was loaded with IV diltiazem upon arrival on 6/19. However failed to convert to sinus rhythm, planned TEE guided cardioversion (needed TEE because eliquis had been held for knee surgery earlier this month) -- Underwent TEE guided cardioversion on 6/21 with successful conversion to sinus rhythm  - Continue eliquis 5 mg BID  -- Patient has a follow up appointment with the A-Fib clinic    [place] well  -- Continue to follow outpatient with Dr. Curt Bears    Orthostatic hypotension - Stable -- BP improved prior to discharge    Recent back surgery - Inflammation may have triggered atrial flutter.  Did the patient have an acute coronary syndrome (MI, NSTEMI, STEMI, etc) this admission?:  No                               Did the patient have a percutaneous coronary intervention (stent / angioplasty)?:  No.    Patient was seen and examined by Dr. Marlou Porch and deemed stable  for discharge.   Patient has an outpatient follow up appointment with the afib clinic in 2 weeks.    _____________  Discharge Vitals Blood pressure 91/63, pulse 61, temperature 97.8 F (36.6 C), temperature source Temporal, resp. rate 18, height '6\' 3"'$  (1.905 m), weight 79.3 kg, SpO2 97 %.  Filed Weights   05/30/22 1519 05/31/22 0034  Weight: 85.7 kg 79.3 kg    Labs & Radiologic Studies    CBC Recent Labs    05/30/22 1534 05/31/22 0205  WBC 5.4 5.2  NEUTROABS 3.1  --    HGB 10.8* 9.6*  HCT 34.9* 28.9*  MCV 106.4* 101.8*  PLT 447* 193   Basic Metabolic Panel Recent Labs    05/30/22 1534 05/30/22 1740 05/31/22 0205  NA 141  --  139  K 4.4  --  4.3  CL 109  --  109  CO2 22  --  24  GLUCOSE 94  --  106*  BUN 16  --  13  CREATININE 1.22  --  1.26*  CALCIUM 9.2  --  9.0  MG  --  2.1  --    Liver Function Tests Recent Labs    05/30/22 1740  AST 28  ALT 19  ALKPHOS 77  BILITOT 1.7*  PROT 6.7  ALBUMIN 3.7   No results for input(s): "LIPASE", "AMYLASE" in the last 72 hours. High Sensitivity Troponin:   Recent Labs  Lab 05/30/22 1534 05/30/22 1740  TROPONINIHS 9 9    BNP Invalid input(s): "POCBNP" D-Dimer No results for input(s): "DDIMER" in the last 72 hours. Hemoglobin A1C No results for input(s): "HGBA1C" in the last 72 hours. Fasting Lipid Panel No results for input(s): "CHOL", "HDL", "LDLCALC", "TRIG", "CHOLHDL", "LDLDIRECT" in the last 72 hours. Thyroid Function Tests Recent Labs    05/31/22 0205  TSH 0.946   _____________  ECHO TEE  Result Date: 06/01/2022    TRANSESOPHOGEAL ECHO REPORT   Patient Name:   Todd Salazar Date of Exam: 06/01/2022 Medical Rec #:  [id number]    Height:       75.0 in Accession #:    [id number]   Weight:       174.9 lb Date of Birth:  [birth year]    BSA:          2.073 m Patient Age:    12 years     BP:           97/68 mmHg Patient Gender: M            HR:           116 bpm. Exam Location:  Inpatient Procedure: Transesophageal Echo, Cardiac Doppler and Color Doppler Indications:     I48.92* Unspecified atrial flutter  History:         Patient has prior history of Echocardiogram examinations, most                  recent 08/19/2021. Arrythmias:Atrial Fibrillation and Atrial                  Flutter.  Sonographer:     Bernadene Person RDCS Referring Phys:  [id number] Donato Heinz Diagnosing Phys: Oswaldo Milian MD PROCEDURE: After discussion of the risks and benefits of a TEE, an informed consent  was obtained from the patient. The transesophogeal probe was passed without difficulty through the esophogus of the patient. Sedation performed by different physician. The patient was monitored while under deep sedation. Anesthestetic sedation was provided  intravenously by Anesthesiology: 262.'48mg'$  of Propofol, '100mg'$  of Lidocaine. The patient's vital signs; including heart rate, blood pressure, and oxygen saturation; remained stable throughout the procedure. The patient developed no complications during the procedure. A successful direct current cardioversion was performed at 100 joules with 1 attempt. IMPRESSIONS  1. Left ventricular ejection fraction, by estimation, is 50 to 55%. The left ventricle has low normal function.  2. Right ventricular systolic function is normal. The right ventricular size is normal.  3. No left atrial/left atrial appendage thrombus was detected.  4. Right atrial size was mildly dilated.  5. The mitral valve is normal in structure. Trivial mitral valve regurgitation.  6. Tricuspid valve regurgitation is mild to moderate.  7. The aortic valve is tricuspid. Aortic valve regurgitation is not visualized. Conclusion(s)/Recommendation(s): No LA/LAA thrombus identified. Successful cardioversion performed. FINDINGS  Left Ventricle: Left ventricular ejection fraction, by estimation, is 50 to 55%. The left ventricle has low normal function. The left ventricular internal cavity size was normal in size. Right Ventricle: The right ventricular size is normal. No increase in right ventricular wall thickness. Right ventricular systolic function is normal. Left Atrium: Left atrial size was normal in size. No left atrial/left atrial appendage thrombus was detected. Right Atrium: Right atrial size was mildly dilated. Pericardium: There is no evidence of pericardial effusion. Mitral Valve: The mitral valve is normal in structure. Trivial mitral valve regurgitation. Tricuspid Valve: The tricuspid valve is  normal in structure. Tricuspid valve regurgitation is mild to moderate. Aortic Valve: The aortic valve is tricuspid. Aortic valve regurgitation is not visualized. Pulmonic Valve: The pulmonic valve was grossly normal. Pulmonic valve regurgitation is trivial. Aorta: The aortic root and ascending aorta are structurally normal, with no evidence of dilitation. IAS/Shunts: No atrial level shunt detected by color flow Doppler. Oswaldo Milian MD Electronically signed by Oswaldo Milian MD Signature Date/Time: 06/01/2022/2:45:38 PM    Final    CT Angio Chest PE W and/or Wo Contrast  Result Date: 05/30/2022 CLINICAL DATA:  A male age 22 presents for evaluation of tachycardia and suspicion for pulmonary embolism. EXAM: CT ANGIOGRAPHY CHEST WITH CONTRAST TECHNIQUE: Multidetector CT imaging of the chest was performed using the standard protocol during bolus administration of intravenous contrast. Multiplanar CT image reconstructions and MIPs were obtained to evaluate the vascular anatomy. RADIATION DOSE REDUCTION: This exam was performed according to the departmental dose-optimization program which includes automated exposure control, adjustment of the mA and/or kV according to patient size and/or use of iterative reconstruction technique. CONTRAST:  63m OMNIPAQUE IOHEXOL 350 MG/ML SOLN COMPARISON:  May 18, 2022. FINDINGS: Cardiovascular:  Aortic caliber is normal.  No acute aortic process. w collateral pathways about LEFT shoulder and LEFT arm likely reflects sequela of marked narrowing of the LEFT brachycephalic vein with numerous collateral pathways throughout the chest and also with filling of RIGHT brachiocephalic vein via collateral pathways as well. This finding is unchanged compared to previous imaging. Heart size is stable. No signs of pericardial effusion or of pericardial nodularity. Central pulmonary vasculature opacified to 248 Hounsfield units. This study is negative for pulmonary embolism to the  segmental level. Mediastinum/Nodes: No thoracic inlet, axillary, mediastinal or hilar adenopathy. Esophagus grossly normal. Lungs/Pleura: No effusion. No consolidative changes. Mild basilar atelectasis. Stable small RIGHT middle lobe pulmonary nodule measuring approximately 4 mm, unchanged since August of 2022 in compatible with a benign pulmonary nodule. Airways are patent. Upper Abdomen: Incidental imaging of upper abdominal contents without acute process. Musculoskeletal: No acute musculoskeletal findings. No destructive  bone lesion. Review of the MIP images confirms the above findings. IMPRESSION: 1. Negative for pulmonary embolism to the segmental level. 2. No acute pulmonary process. 3. Stable 4 mm RIGHT middle lobe pulmonary nodule, unchanged since August of 2022 in compatible with a benign pulmonary nodule. 4. 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 Electronically Signed   By: Zetta Bills M.D.   On: 05/30/2022 18:33   DG Chest 1 View  Result Date: 05/30/2022 CLINICAL DATA:  Tachycardia EXAM: CHEST  1 VIEW COMPARISON:  May 18, 2022 FINDINGS: The heart size and mediastinal contours are stable. Cardiac pacemaker is unchanged. Both lungs are clear. The lungs are hyperinflated. The visualized skeletal structures are unremarkable. IMPRESSION: No active cardiopulmonary disease. COPD. Electronically Signed   By: Abelardo Diesel M.D.   On: 05/30/2022 17:46    Disposition   Pt is being discharged home today in good condition.  Follow-up Plans & Appointments     Follow-up Information     [facility] Follow up on 06/15/2022.   Specialty: Cardiology Why: Appointment at 8:30 AM with Adline Peals PA-C Contact information: [address] [phone] [place] [phone]               Discharge Instructions     Call MD for:  difficulty breathing,  headache or visual disturbances   Complete by: As directed    Call MD for:  persistant dizziness or light-headedness   Complete by: As directed    Call MD for:  severe uncontrolled pain   Complete by: As directed    Call MD for:  temperature >100.4   Complete by: As directed    Diet - low sodium heart healthy   Complete by: As directed    Increase activity slowly   Complete by: As directed    No wound care   Complete by: As directed        Discharge Medications   Allergies as of 06/01/2022       Reactions   Ciprofloxacin Anaphylaxis, Other (See Comments)   Dizziness, also   Tramadol Swelling, Other (See Comments)   Laryngeal edema   Duloxetine Other (See Comments)   Delusions   Hydrocodone-acetaminophen Nausea And Vomiting   Nisoldipine Other (See Comments)   Pt unsure of reaction    Benadryl [diphenhydramine] Anxiety, Other (See Comments)   Panic attack    Methocarbamol Nausea Only   Ondansetron Hcl Nausea Only   Sulfa Antibiotics Rash        Medication List     TAKE these medications    Advil 200 MG Caps Generic drug: Ibuprofen Take 200-400 mg by mouth every 6 (six) hours as needed (for headaches or mild pain).   apixaban 5 MG Tabs tablet Commonly known as: ELIQUIS Take 1 tablet (5 mg total) by mouth 2 (two) times daily.   gabapentin 100 MG capsule Commonly known as: NEURONTIN Take 200 mg by mouth at bedtime.   polyethylene glycol 17 g packet Commonly known as: MIRALAX / GLYCOLAX Take 17 g by mouth daily as needed for mild constipation. What changed: reasons to take this   promethazine 12.5 MG tablet Commonly known as: PHENERGAN Take 1 tablet (12.5 mg total) by mouth every 6 (six) hours as needed for nausea or vomiting.   tiZANidine 2 MG tablet Commonly known as: ZANAFLEX Take 1 tablet (2 mg total) by mouth every 6 (six)  hours as needed for muscle spasms.           Outstanding Labs/Studies    Duration of Discharge Encounter   Greater  than 30 minutes including physician time.  Signed, Margie Billet, PA-C 06/01/2022, 4:19 PM   Personally seen and examined. Agree with above.  Successful cardioversion of atrial flutter.  Feeling well.  Okay to be discharged home.  He was not on any rate controlling medications as outpatient.  Blood pressure has been soft during this admission.  We will hold off on utilization of diltiazem as outpatient.  He has had prior atrial fibrillation ablation last November.  If flutter or fibrillation returns, discuss next steps with Dr. Curt Bears, possible secondary ablation.  Ambulating well.  Candee Furbish, MD

## 2022-06-01 NOTE — Progress Notes (Signed)
Mobility Specialist: Progress Note   06/01/22 1623  Mobility  Activity Ambulated with assistance in hallway  Level of Assistance Modified independent, requires aide device or extra time  Assistive Device Front wheel walker  Distance Ambulated (ft) 360 ft  Activity Response Tolerated well  $Mobility charge 1 Mobility   During Mobility: 102 HR Post-Mobility: 80 HR  Received pt in bed having no complaints and agreeable to mobility. Pt was asymptomatic throughout ambulation and returned to room w/o fault. Left in bed w/ call bell in reach and all needs met.  [facility] Todd Salazar Mobility Specialist Mobility Specialist [address]: [phone]

## 2022-06-01 NOTE — Transfer of Care (Signed)
Immediate Anesthesia Transfer of Care Note  Patient: Todd Salazar  Procedure(s) Performed: TRANSESOPHAGEAL ECHOCARDIOGRAM (TEE) CARDIOVERSION  Patient Location: Endoscopy Unit  Anesthesia Type:General  Level of Consciousness: awake, alert  and oriented  Airway & Oxygen Therapy: Patient Spontanous Breathing and Patient connected to face mask oxygen  Post-op Assessment: Report given to RN and Post -op Vital signs reviewed and stable  Post vital signs: Reviewed and stable  Last Vitals:  Vitals Value Taken Time  BP 83/46 06/01/22 1259  Temp    Pulse 59 06/01/22 1259  Resp 20 06/01/22 1259  SpO2 100 % 06/01/22 1259    Last Pain:  Vitals:   06/01/22 1145  TempSrc: Temporal  PainSc: 0-No pain         Complications: No notable events documented.

## 2022-06-01 NOTE — H&P (View-Only) (Signed)
Progress Note  Patient Name: Todd Salazar Date of Encounter: 06/01/2022  [facility] Cardiologist: None Dr. Curt Bears  Subjective   Comfortable but heart rate elevated.  Taking apixaban.  On Cardizem drip.  Inpatient Medications    Scheduled Meds:  apixaban  5 mg Oral BID   feeding supplement  1 Container Oral TID BM   multivitamin with minerals  1 tablet Oral Daily   Continuous Infusions:  diltiazem (CARDIZEM) infusion 7.5 mg/hr (05/31/22 1442)   PRN Meds: acetaminophen, gabapentin, nitroGLYCERIN, tiZANidine   Vital Signs    Vitals:   06/01/22 0410 06/01/22 0700 06/01/22 0740 06/01/22 0800  BP: 96/76 97/68  95/71  Pulse: (!) 126 (!) 126  (!) 126  Resp: '20 20 14 15  '$ Temp: 98.1 F (36.7 C) 98.4 F (36.9 C)  98.2 F (36.8 C)  TempSrc: Oral Oral  Oral  SpO2: 97% 96%  96%  Weight:      Height:        Intake/Output Summary (Last 24 hours) at 06/01/2022 1017 Last data filed at 06/01/2022 0740 Gross per 24 hour  Intake --  Output 775 ml  Net -775 ml      05/31/2022   12:34 AM 05/30/2022    3:19 PM 04/15/2022   10:02 PM  Last 3 Weights  Weight (lbs) 174 lb 14.4 oz 189 lb 189 lb 9.5 oz  Weight (kg) 79.334 kg 85.73 kg 86 kg      Telemetry    Atrial flutter elevated heart rates in the 130s.- Personally Reviewed  ECG    Atrial flutter with rapid ventricular response- Personally Reviewed  Physical Exam   Alert and oriented x3 with tachycardic regular rhythm  Labs    High Sensitivity Troponin:   Recent Labs  Lab 05/30/22 1534 05/30/22 1740  TROPONINIHS 9 9     Chemistry Recent Labs  Lab 05/30/22 1534 05/30/22 1740 05/31/22 0205  NA 141  --  139  K 4.4  --  4.3  CL 109  --  109  CO2 22  --  24  GLUCOSE 94  --  106*  BUN 16  --  13  CREATININE 1.22  --  1.26*  CALCIUM 9.2  --  9.0  MG  --  2.1  --   PROT  --  6.7  --   ALBUMIN  --  3.7  --   AST  --  28  --   ALT  --  19  --   ALKPHOS  --  77  --   BILITOT  --  1.7*  --   GFRNONAA 58*   --  56*  ANIONGAP 10  --  6    Lipids No results for input(s): "CHOL", "TRIG", "HDL", "LABVLDL", "LDLCALC", "CHOLHDL" in the last 168 hours.  Hematology Recent Labs  Lab 05/30/22 1534 05/31/22 0205  WBC 5.4 5.2  RBC 3.28* 2.84*  HGB 10.8* 9.6*  HCT 34.9* 28.9*  MCV 106.4* 101.8*  MCH 32.9 33.8  MCHC 30.9 33.2  RDW 23.8* 23.9*  PLT 447* 384   Thyroid  Recent Labs  Lab 05/31/22 0205  TSH 0.946    BNP Recent Labs  Lab 05/30/22 1740  BNP 256.1*    DDimer No results for input(s): "DDIMER" in the last 168 hours.   Radiology    CT Angio Chest PE W and/or Wo Contrast  Result Date: 05/30/2022 CLINICAL DATA:  A male age 53 presents for evaluation of tachycardia and suspicion  for pulmonary embolism. EXAM: CT ANGIOGRAPHY CHEST WITH CONTRAST TECHNIQUE: Multidetector CT imaging of the chest was performed using the standard protocol during bolus administration of intravenous contrast. Multiplanar CT image reconstructions and MIPs were obtained to evaluate the vascular anatomy. RADIATION DOSE REDUCTION: This exam was performed according to the departmental dose-optimization program which includes automated exposure control, adjustment of the mA and/or kV according to patient size and/or use of iterative reconstruction technique. CONTRAST:  80m OMNIPAQUE IOHEXOL 350 MG/ML SOLN COMPARISON:  May 18, 2022. FINDINGS: Cardiovascular:  Aortic caliber is normal.  No acute aortic process. w collateral pathways about LEFT shoulder and LEFT arm likely reflects sequela of marked narrowing of the LEFT brachycephalic vein with numerous collateral pathways throughout the chest and also with filling of RIGHT brachiocephalic vein via collateral pathways as well. This finding is unchanged compared to previous imaging. Heart size is stable. No signs of pericardial effusion or of pericardial nodularity. Central pulmonary vasculature opacified to 248 Hounsfield units. This study is negative for pulmonary embolism  to the segmental level. Mediastinum/Nodes: No thoracic inlet, axillary, mediastinal or hilar adenopathy. Esophagus grossly normal. Lungs/Pleura: No effusion. No consolidative changes. Mild basilar atelectasis. Stable small RIGHT middle lobe pulmonary nodule measuring approximately 4 mm, unchanged since August of 2022 in compatible with a benign pulmonary nodule. Airways are patent. Upper Abdomen: Incidental imaging of upper abdominal contents without acute process. Musculoskeletal: No acute musculoskeletal findings. No destructive bone lesion. Review of the MIP images confirms the above findings. IMPRESSION: 1. Negative for pulmonary embolism to the segmental level. 2. No acute pulmonary process. 3. Stable 4 mm RIGHT middle lobe pulmonary nodule, unchanged since August of 2022 in compatible with a benign pulmonary nodule. 4. 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 Electronically Signed   By: GZetta BillsM.D.   On: 05/30/2022 18:33   DG Chest 1 View  Result Date: 05/30/2022 CLINICAL DATA:  Tachycardia EXAM: CHEST  1 VIEW COMPARISON:  May 18, 2022 FINDINGS: The heart size and mediastinal contours are stable. Cardiac pacemaker is unchanged. Both lungs are clear. The lungs are hyperinflated. The visualized skeletal structures are unremarkable. IMPRESSION: No active cardiopulmonary disease. COPD. Electronically Signed   By: WAbelardo DieselM.D.   On: 05/30/2022 17:46    Cardiac Studies   EF 60% Coronary calcium score 82 which is 16th percentile. Negative PE  Patient Profile     86y.o. male with paroxysmal atrial fibrillation and flutter status post [facility], orthostatic hypotension prostate cancer chronic kidney disease stage IIIa.  Presented with chest tightness fatigue over the past week and was found to be in atrial flutter with RVR upon device interrogation.  Went to the  emergency room.  Assessment & Plan    Paroxysmal atrial flutter - Post atrial fibrillation ablation. -He is on for TEE cardioversion tody at 1 PM.  Risk and benefits have been discussed, willing to proceed.  Family in room. -Continue with IV diltiazem for now.  Atrial flutter is challenging to control.  Lots of questions answered. -Continue with Eliquis. -Difficult to rate control.  SHirampacemaker - Functioning well.  Orthostatic hypotension - Stable  Recent back surgery - Inflammation may have triggered atrial flutter.  -NPO  For questions or updates, please contact CDe SotoPlease consult [email or web address] for contact info under        Signed, MCandee Furbish MD  06/01/2022, 10:17 AM

## 2022-06-01 NOTE — Progress Notes (Signed)
  Echocardiogram Echocardiogram Transesophageal has been performed.  Fidel Levy 06/01/2022, 1:44 PM

## 2022-06-01 NOTE — Progress Notes (Signed)
Progress Note  Patient Name: Bralynn Donado Date of Encounter: 06/01/2022  [facility]Care Cardiologist: None Dr. Curt Bears  Subjective   Comfortable but heart rate elevated.  Taking apixaban.  On Cardizem drip.  Inpatient Medications    Scheduled Meds:  apixaban  5 mg Oral BID   feeding supplement  1 Container Oral TID BM   multivitamin with minerals  1 tablet Oral Daily   Continuous Infusions:  diltiazem (CARDIZEM) infusion 7.5 mg/hr (05/31/22 1442)   PRN Meds: acetaminophen, gabapentin, nitroGLYCERIN, tiZANidine   Vital Signs    Vitals:   06/01/22 0410 06/01/22 0700 06/01/22 0740 06/01/22 0800  BP: 96/76 97/68  95/71  Pulse: (!) 126 (!) 126  (!) 126  Resp: '20 20 14 15  '$ Temp: 98.1 F (36.7 C) 98.4 F (36.9 C)  98.2 F (36.8 C)  TempSrc: Oral Oral  Oral  SpO2: 97% 96%  96%  Weight:      Height:        Intake/Output Summary (Last 24 hours) at 06/01/2022 1017 Last data filed at 06/01/2022 0740 Gross per 24 hour  Intake --  Output 775 ml  Net -775 ml      05/31/2022   12:34 AM 05/30/2022    3:19 PM 04/15/2022   10:02 PM  Last 3 Weights  Weight (lbs) 174 lb 14.4 oz 189 lb 189 lb 9.5 oz  Weight (kg) 79.334 kg 85.73 kg 86 kg      Telemetry    Atrial flutter elevated heart rates in the 130s.- Personally Reviewed  ECG    Atrial flutter with rapid ventricular response- Personally Reviewed  Physical Exam   Alert and oriented x3 with tachycardic regular rhythm  Labs    High Sensitivity Troponin:   Recent Labs  Lab 05/30/22 1534 05/30/22 1740  TROPONINIHS 9 9     Chemistry Recent Labs  Lab 05/30/22 1534 05/30/22 1740 05/31/22 0205  NA 141  --  139  K 4.4  --  4.3  CL 109  --  109  CO2 22  --  24  GLUCOSE 94  --  106*  BUN 16  --  13  CREATININE 1.22  --  1.26*  CALCIUM 9.2  --  9.0  MG  --  2.1  --   PROT  --  6.7  --   ALBUMIN  --  3.7  --   AST  --  28  --   ALT  --  19  --   ALKPHOS  --  77  --   BILITOT  --  1.7*  --   GFRNONAA 58*   --  56*  ANIONGAP 10  --  6    Lipids No results for input(s): "CHOL", "TRIG", "HDL", "LABVLDL", "LDLCALC", "CHOLHDL" in the last 168 hours.  Hematology Recent Labs  Lab 05/30/22 1534 05/31/22 0205  WBC 5.4 5.2  RBC 3.28* 2.84*  HGB 10.8* 9.6*  HCT 34.9* 28.9*  MCV 106.4* 101.8*  MCH 32.9 33.8  MCHC 30.9 33.2  RDW 23.8* 23.9*  PLT 447* 384   Thyroid  Recent Labs  Lab 05/31/22 0205  TSH 0.946    BNP Recent Labs  Lab 05/30/22 1740  BNP 256.1*    DDimer No results for input(s): "DDIMER" in the last 168 hours.   Radiology    CT Angio Chest PE W and/or Wo Contrast  Result Date: 05/30/2022 CLINICAL DATA:  A male age 13 presents for evaluation of tachycardia and suspicion  for pulmonary embolism. EXAM: CT ANGIOGRAPHY CHEST WITH CONTRAST TECHNIQUE: Multidetector CT imaging of the chest was performed using the standard protocol during bolus administration of intravenous contrast. Multiplanar CT image reconstructions and MIPs were obtained to evaluate the vascular anatomy. RADIATION DOSE REDUCTION: This exam was performed according to the departmental dose-optimization program which includes automated exposure control, adjustment of the mA and/or kV according to patient size and/or use of iterative reconstruction technique. CONTRAST:  16m OMNIPAQUE IOHEXOL 350 MG/ML SOLN COMPARISON:  May 18, 2022. FINDINGS: Cardiovascular:  Aortic caliber is normal.  No acute aortic process. w collateral pathways about LEFT shoulder and LEFT arm likely reflects sequela of marked narrowing of the LEFT brachycephalic vein with numerous collateral pathways throughout the chest and also with filling of RIGHT brachiocephalic vein via collateral pathways as well. This finding is unchanged compared to previous imaging. Heart size is stable. No signs of pericardial effusion or of pericardial nodularity. Central pulmonary vasculature opacified to 248 Hounsfield units. This study is negative for pulmonary embolism  to the segmental level. Mediastinum/Nodes: No thoracic inlet, axillary, mediastinal or hilar adenopathy. Esophagus grossly normal. Lungs/Pleura: No effusion. No consolidative changes. Mild basilar atelectasis. Stable small RIGHT middle lobe pulmonary nodule measuring approximately 4 mm, unchanged since August of 2022 in compatible with a benign pulmonary nodule. Airways are patent. Upper Abdomen: Incidental imaging of upper abdominal contents without acute process. Musculoskeletal: No acute musculoskeletal findings. No destructive bone lesion. Review of the MIP images confirms the above findings. IMPRESSION: 1. Negative for pulmonary embolism to the segmental level. 2. No acute pulmonary process. 3. Stable 4 mm RIGHT middle lobe pulmonary nodule, unchanged since August of 2022 in compatible with a benign pulmonary nodule. 4. 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 Electronically Signed   By: GZetta BillsM.D.   On: 05/30/2022 18:33   DG Chest 1 View  Result Date: 05/30/2022 CLINICAL DATA:  Tachycardia EXAM: CHEST  1 VIEW COMPARISON:  May 18, 2022 FINDINGS: The heart size and mediastinal contours are stable. Cardiac pacemaker is unchanged. Both lungs are clear. The lungs are hyperinflated. The visualized skeletal structures are unremarkable. IMPRESSION: No active cardiopulmonary disease. COPD. Electronically Signed   By: WAbelardo DieselM.D.   On: 05/30/2022 17:46    Cardiac Studies   EF 60% Coronary calcium score 82 which is 16th percentile. Negative PE  Patient Profile     86y.o. male with paroxysmal atrial fibrillation and flutter status post [facility], orthostatic hypotension prostate cancer chronic kidney disease stage IIIa.  Presented with chest tightness fatigue over the past week and was found to be in atrial flutter with RVR upon device interrogation.  Went to the  emergency room.  Assessment & Plan    Paroxysmal atrial flutter - Post atrial fibrillation ablation. -He is on for TEE cardioversion tody at 1 PM.  Risk and benefits have been discussed, willing to proceed.  Family in room. -Continue with IV diltiazem for now.  Atrial flutter is challenging to control.  Lots of questions answered. -Continue with Eliquis. -Difficult to rate control.  SEustacepacemaker - Functioning well.  Orthostatic hypotension - Stable  Recent back surgery - Inflammation may have triggered atrial flutter.  -NPO  For questions or updates, please contact CBalmorheaPlease consult [email or web address] for contact info under        Signed, MCandee Furbish MD  06/01/2022, 10:17 AM

## 2022-06-01 NOTE — Progress Notes (Signed)
  Transition of Care [facility]) Screening Note   Patient Details  Name: Cleston Lautner Date of Birth: 1936-01-14   Transition of Care [facility]) CM/SW Contact:    Tom-Johnson, Renea Ee, RN Phone Number: 06/01/2022, 4:08 PM  Transition of Care Department [facility]) has reviewed patient and no TOC needs have been identified at this time. We will continue to monitor patient advancement through interdisciplinary progression rounds. If new patient transition needs arise, please place a TOC consult.

## 2022-06-02 NOTE — Anesthesia Postprocedure Evaluation (Signed)
Anesthesia Post Note  Patient: Todd Salazar  Procedure(s) Performed: TRANSESOPHAGEAL ECHOCARDIOGRAM (TEE) CARDIOVERSION     Patient location during evaluation: Endoscopy Anesthesia Type: MAC Level of consciousness: awake and alert Pain management: pain level controlled Vital Signs Assessment: post-procedure vital signs reviewed and stable Respiratory status: spontaneous breathing, nonlabored ventilation, respiratory function stable and patient connected to nasal cannula oxygen Cardiovascular status: stable and blood pressure returned to baseline Postop Assessment: no apparent nausea or vomiting Anesthetic complications: no   No notable events documented.  Last Vitals:  Vitals:   06/01/22 1326 06/01/22 1350  BP: (!) 93/51 91/63  Pulse: 61 61  Resp: (!) 21 18  Temp:    SpO2: 100% 97%    Last Pain:  Vitals:   06/01/22 1350  TempSrc:   PainSc: 0-No pain                 Belenda Cruise P Levonia Wolfley

## 2022-06-09 ENCOUNTER — Ambulatory Visit (INDEPENDENT_AMBULATORY_CARE_PROVIDER_SITE_OTHER): Payer: PPO

## 2022-06-09 DIAGNOSIS — I4819 Other persistent atrial fibrillation: Secondary | ICD-10-CM

## 2022-06-09 LAB — CUP PACEART REMOTE DEVICE CHECK
Battery Remaining Longevity: 98 mo
Battery Remaining Percentage: 86 %
Battery Voltage: 3.01 V
Brady Statistic AP VP Percent: 1 %
Brady Statistic AP VS Percent: 29 %
Brady Statistic AS VP Percent: 1 %
Brady Statistic AS VS Percent: 71 %
Brady Statistic RA Percent Paced: 25 %
Brady Statistic RV Percent Paced: 1 %
Date Time Interrogation Session: 20230629040016
Implantable Lead Implant Date: 20211011
Implantable Lead Implant Date: 20211011
Implantable Lead Location: 753859
Implantable Lead Location: 753860
Implantable Pulse Generator Implant Date: 20211011
Lead Channel Impedance Value: 360 Ohm
Lead Channel Impedance Value: 450 Ohm
Lead Channel Pacing Threshold Amplitude: 0.75 V
Lead Channel Pacing Threshold Amplitude: 0.75 V
Lead Channel Pacing Threshold Pulse Width: 0.5 ms
Lead Channel Pacing Threshold Pulse Width: 0.5 ms
Lead Channel Sensing Intrinsic Amplitude: 2.1 mV
Lead Channel Sensing Intrinsic Amplitude: 7.7 mV
Lead Channel Setting Pacing Amplitude: 2 V
Lead Channel Setting Pacing Amplitude: 2 V
Lead Channel Setting Pacing Pulse Width: 0.5 ms
Lead Channel Setting Sensing Sensitivity: 2 mV
Pulse Gen Model: 2272
Pulse Gen Serial Number: 3869163

## 2022-06-15 ENCOUNTER — Encounter (HOSPITAL_COMMUNITY): Payer: Self-pay | Admitting: Physician Assistant

## 2022-06-15 ENCOUNTER — Ambulatory Visit (HOSPITAL_COMMUNITY)
Admission: RE | Admit: 2022-06-15 | Discharge: 2022-06-15 | Disposition: A | Discharge status: Home / Self Care | Payer: PPO | Source: Ambulatory Visit | Attending: Physician Assistant | Admitting: Physician Assistant

## 2022-06-15 VITALS — BP 110/70 | HR 76 | Ht 75.0 in | Wt 177.4 lb

## 2022-06-15 DIAGNOSIS — I4819 Other persistent atrial fibrillation: Secondary | ICD-10-CM | POA: Diagnosis present

## 2022-06-15 DIAGNOSIS — D6869 Other thrombophilia: Secondary | ICD-10-CM | POA: Insufficient documentation

## 2022-06-15 DIAGNOSIS — N189 Chronic kidney disease, unspecified: Secondary | ICD-10-CM | POA: Diagnosis not present

## 2022-06-15 DIAGNOSIS — Z7901 Long term (current) use of anticoagulants: Secondary | ICD-10-CM | POA: Insufficient documentation

## 2022-06-15 DIAGNOSIS — R001 Bradycardia, unspecified: Secondary | ICD-10-CM | POA: Insufficient documentation

## 2022-06-15 DIAGNOSIS — I4892 Unspecified atrial flutter: Secondary | ICD-10-CM | POA: Diagnosis not present

## 2022-06-15 NOTE — Progress Notes (Signed)
Primary Care Physician: [name], DO Primary Cardiologist: Dr Minna Merritts Primary Electrophysiologist: Dr Curt Bears  Referring Physician: Dr Guido Sander Todd Salazar is a 86 y.o. male with a history of bronchiectasis/restrictive lung disease, symptomatic bradycardia s/p PPM, orthostatic hypotension, prostate cancer, CKD, atrial tachycardia, atrial fibrillation who presents for follow up in the [facility]. Patient is on Eliquis for a CHADS2VASC score of 2. Patient recently hospitalized for afib with RVR and loaded on dofetilide. He converted with the medication and did not require DCCV. However, he presented to the ED 08/23/21 with  rapid afib. He was about to undergo DCCV when he spontaneously converted to SR.   Patient is s/p afib ablation with Dr Curt Bears on 10/29/21. He did present to the ED 10/31/21 with CP and was diagnosed with pericarditis, started on colchicine. His CP has resolved.   He had several ED visits May and June 2023 for hip and back pain and had back surgery on 05/16/22. He presented to the ED 05/30/22 with afib with RVR and underwent TEE guided DCCV on 06/01/22.  On follow up today, patient reports that he feels well today. He typically only has brief, rare heart racing. His back is feeling much better and he is working with physical therapy. No bleeding issues on anticoagulation.   Today, he denies symptoms of palpitations, chest pain, shortness of breath, orthopnea, PND, lower extremity edema, presyncope, syncope, snoring, daytime somnolence, bleeding, or neurologic sequela. The patient is tolerating medications without difficulties and is otherwise without complaint today.    Atrial Fibrillation Risk Factors:  he does not have symptoms or diagnosis of sleep apnea. he does not have a history of rheumatic fever.   he has a BMI of Body mass index is 22.17 kg/m.Marland Kitchen Filed Weights   06/15/22 0829  Weight: 80.5 kg     Family History  Problem  Relation Age of Onset   Hypertension Father    CAD Neg Hx    Heart failure Neg Hx    Stroke Neg Hx      Atrial Fibrillation Management history:  Previous antiarrhythmic drugs: flecainide, sotalol, propafenone, amiodarone  Previous cardioversions: 06/01/22 Previous ablations: 10/29/21 CHADS2VASC score: 2 Anticoagulation history: Eliquis   Past Medical History:  Diagnosis Date   Arthritis    Atrial fibrillation ([place])    Cancer ([place])    prostate   Dysrhythmia    afib   History of kidney stones    Past Surgical History:  Procedure Laterality Date   ATRIAL FIBRILLATION ABLATION N/A 10/29/2021   Procedure: ATRIAL FIBRILLATION ABLATION;  Surgeon: Constance Haw, MD;  Location: [place] CV LAB;  Service: Cardiovascular;  Laterality: N/A;   Bone spur     CARDIOVERSION N/A 06/01/2022   Procedure: CARDIOVERSION;  Surgeon: Donato Heinz, MD;  Location[facility] ENDOSCOPY;  Service: Cardiovascular;  Laterality: N/A;   CHOLECYSTECTOMY     HERNIA REPAIR     JOINT REPLACEMENT     kidney stones     LAMINECTOMY WITH POSTERIOR LATERAL ARTHRODESIS LEVEL 1 Right 11/25/2019   Procedure: Laminectomy and Foraminotomy  Lumbar Four-Lumbar Five - right, with instrumented fusion;  Surgeon: Eustace Moore, MD;  Location: [facility];  Service: Neurosurgery;  Laterality: Right;  Laminectomy and Foraminotomy  Lumbar Four-Lumbar Five - right, with instrumented fusion   PROSTATE SURGERY     TEE WITHOUT CARDIOVERSION N/A 06/01/2022   Procedure: TRANSESOPHAGEAL ECHOCARDIOGRAM (TEE);  Surgeon: Donato Heinz, MD;  Location: [facility]  ENDOSCOPY;  Service: Cardiovascular;  Laterality: N/A;    Current Outpatient Medications  Medication Sig Dispense Refill   ADVIL 200 MG CAPS Take 200-400 mg by mouth every 6 (six) hours as needed (for headaches or mild pain).     apixaban (ELIQUIS) 5 MG TABS tablet Take 1 tablet (5 mg total) by mouth 2 (two) times daily. 180 tablet 1   gabapentin (NEURONTIN) 100 MG  capsule Take 200 mg by mouth at bedtime.     polyethylene glycol (MIRALAX / GLYCOLAX) 17 g packet Take 17 g by mouth daily as needed for mild constipation. 14 each 0   simethicone (MYLICON) 80 MG chewable tablet Chew 80 mg by mouth 4 (four) times daily as needed.     No current facility-administered medications for this encounter.    Allergies  Allergen Reactions   Ciprofloxacin Anaphylaxis and Other (See Comments)    Dizziness, also   Tramadol Swelling and Other (See Comments)    Laryngeal edema   Duloxetine Other (See Comments)    Delusions    Hydrocodone-Acetaminophen Nausea And Vomiting   Nisoldipine Other (See Comments)    Pt unsure of reaction    Benadryl [Diphenhydramine] Anxiety and Other (See Comments)    Panic attack    Methocarbamol Nausea Only   Ondansetron Hcl Nausea Only   Sulfa Antibiotics Rash    Social History   Socioeconomic History   Marital status: Widowed    Spouse name: Not on file   Number of children: Not on file   Years of education: Not on file   Highest education level: Not on file  Occupational History   Not on file  Tobacco Use   Smoking status: Former    Types: Cigarettes    Quit date: 12/10/1996    Years since quitting: 25.5   Smokeless tobacco: Never   Tobacco comments:    Former smoker (08/27/2021)  Vaping Use   Vaping Use: Never used  Substance and Sexual Activity   Alcohol use: No   Drug use: No   Sexual activity: Never  Other Topics Concern   Not on file  Social History Narrative   Not on file   Social Determinants of Health   Financial Resource Strain: Not on file  Food Insecurity: Not on file  Transportation Needs: Not on file  Physical Activity: Not on file  Stress: Not on file  Social Connections: Not on file  Intimate Partner Violence: Not on file     ROS- All systems are reviewed and negative except as per the HPI above.  Physical Exam: Vitals:   06/15/22 0829  BP: 110/70  Pulse: 76  Weight: 80.5 kg   Height: '6\' 3"'$  (1.905 m)     GEN- The patient is a well appearing elderly male, alert and oriented x 3 today.   HEENT-head normocephalic, atraumatic, sclera clear, conjunctiva pink, hearing intact, trachea midline. Lungs- Clear to ausculation bilaterally, normal work of breathing Heart- Regular rate and rhythm, no murmurs, rubs or gallops  GI- soft, NT, ND, + BS Extremities- no clubbing, cyanosis, or edema MS- no significant deformity or atrophy Skin- no rash or lesion Psych- euthymic mood, full affect Neuro- strength and sensation are intact   Wt Readings from Last 3 Encounters:  06/15/22 80.5 kg  05/31/22 79.3 kg  04/15/22 86 kg    EKG today demonstrates  SR, PVC Vent. rate 76 BPM PR interval 180 ms QRS duration 94 ms QT/QTcB 396/445 ms  Echo 08/19/21 demonstrated  1. Left ventricular ejection fraction, by estimation, is 55 to 60%. The  left ventricle has normal function. The left ventricle has no regional  wall motion abnormalities. There is mild left ventricular hypertrophy.  Left ventricular diastolic parameters were normal.   2. Right ventricular systolic function is normal. The right ventricular  size is mildly enlarged. There is normal pulmonary artery systolic  pressure. The estimated right ventricular systolic pressure is 10.2 mmHg.   3. Right atrial size was mildly dilated.   4. The mitral valve is normal in structure. Trivial mitral valve  regurgitation.   5. Tricuspid valve regurgitation is mild to moderate.   6. The aortic valve is tricuspid. Aortic valve regurgitation is not  visualized. No aortic stenosis is present.   7. The inferior vena cava is normal in size with greater than 50%  respiratory variability, suggesting right atrial pressure of 3 mmHg.   Epic records are reviewed at length today  CHA2DS2-VASc Score = 2  The patient's score is based upon: CHF History: 0 HTN History: 0 Diabetes History: 0 Stroke History: 0 Vascular Disease History:  0 Age Score: 2 Gender Score: 0       ASSESSMENT AND PLAN: 1. Persistent Atrial Fibrillation/atrial flutter The patient's CHA2DS2-VASc score is 2, indicating a 2.2% annual risk of stroke.   S/p dofetilide loading.  Recall he has not tolerated rate control in the past due to hypotension. S/p afib ablation 10/29/21 S/p TEE/DCCV 06/01/22 Patient maintaining SR. Continue Eliquis 5 mg BID  2. Secondary Hypercoagulable State (ICD10:  D68.69) The patient is at significant risk for stroke/thromboembolism based upon his CHA2DS2-VASc Score of 2.  Continue Apixaban (Eliquis).   3. Symptomatic bradycardia S/p PPM, followed by Dr Curt Bears and the device clinic.   Follow up with Dr Curt Bears in 3 months.    [facility] [address] [place], [place] [address] [phone] 06/15/2022 8:58 AM

## 2022-06-27 NOTE — Progress Notes (Signed)
Remote pacemaker transmission.   

## 2022-07-20 ENCOUNTER — Telehealth: Payer: Self-pay | Admitting: Cardiology

## 2022-07-20 NOTE — Telephone Encounter (Signed)
Spoke with the patient's daughter in law who reports that the patient has not been feeling well since Sunday. She states that it started out with shortness of breath and then he started having chest pain. He took one nitro on Sunday with some relief. He has continued to not feel well with worsening shortness of breath. He took another nitroglycerin yesterday. Today he has taken two nitroglycerin with no relief of chest pain. I advised her that with these symptoms and no relief of pain after two nitro he will need to go to the ER for further evaluation. She verbalized understanding.

## 2022-07-20 NOTE — Telephone Encounter (Signed)
Pt c/o of Chest Pain: STAT if CP now or developed within 24 hours  1. Are you having CP right now? No  2. Are you experiencing any other symptoms (ex. SOB, nausea, vomiting, sweating)? SOB - Sunday  3. How long have you been experiencing CP?  Sunday 4. Is your CP continuous or coming and going? Continuous  5. Have you taken Nitroglycerin?  Yes - 2 today, 1 - Yesterday   Spouse states that pt has pain around heart area ?

## 2022-09-08 ENCOUNTER — Ambulatory Visit (INDEPENDENT_AMBULATORY_CARE_PROVIDER_SITE_OTHER): Payer: PPO

## 2022-09-08 DIAGNOSIS — I4819 Other persistent atrial fibrillation: Secondary | ICD-10-CM | POA: Diagnosis not present

## 2022-09-09 LAB — CUP PACEART REMOTE DEVICE CHECK
Battery Remaining Longevity: 96 mo
Battery Remaining Percentage: 83 %
Battery Voltage: 3.01 V
Brady Statistic AP VP Percent: 1 %
Brady Statistic AP VS Percent: 31 %
Brady Statistic AS VP Percent: 1 %
Brady Statistic AS VS Percent: 65 %
Brady Statistic RA Percent Paced: 25 %
Brady Statistic RV Percent Paced: 1 %
Date Time Interrogation Session: 20230928040015
Implantable Lead Implant Date: 20211011
Implantable Lead Implant Date: 20211011
Implantable Lead Location: 753859
Implantable Lead Location: 753860
Implantable Pulse Generator Implant Date: 20211011
Lead Channel Impedance Value: 360 Ohm
Lead Channel Impedance Value: 440 Ohm
Lead Channel Pacing Threshold Amplitude: 0.75 V
Lead Channel Pacing Threshold Amplitude: 0.75 V
Lead Channel Pacing Threshold Pulse Width: 0.5 ms
Lead Channel Pacing Threshold Pulse Width: 0.5 ms
Lead Channel Sensing Intrinsic Amplitude: 2.2 mV
Lead Channel Sensing Intrinsic Amplitude: 6 mV
Lead Channel Setting Pacing Amplitude: 2 V
Lead Channel Setting Pacing Amplitude: 2 V
Lead Channel Setting Pacing Pulse Width: 0.5 ms
Lead Channel Setting Sensing Sensitivity: 2 mV
Pulse Gen Model: 2272
Pulse Gen Serial Number: 3869163

## 2022-09-12 ENCOUNTER — Encounter: Payer: Self-pay | Admitting: Cardiology

## 2022-09-12 ENCOUNTER — Ambulatory Visit (INDEPENDENT_AMBULATORY_CARE_PROVIDER_SITE_OTHER): Payer: PPO

## 2022-09-12 ENCOUNTER — Ambulatory Visit: Payer: PPO | Attending: Cardiology | Admitting: Cardiology

## 2022-09-12 VITALS — BP 122/60 | HR 79 | Ht 75.0 in | Wt 185.1 lb

## 2022-09-12 DIAGNOSIS — I480 Paroxysmal atrial fibrillation: Secondary | ICD-10-CM | POA: Diagnosis not present

## 2022-09-12 DIAGNOSIS — I493 Ventricular premature depolarization: Secondary | ICD-10-CM

## 2022-09-12 DIAGNOSIS — D6869 Other thrombophilia: Secondary | ICD-10-CM | POA: Diagnosis not present

## 2022-09-12 LAB — CUP PACEART INCLINIC DEVICE CHECK
Battery Remaining Longevity: 106 mo
Battery Voltage: 3.01 V
Brady Statistic RA Percent Paced: 25 %
Brady Statistic RV Percent Paced: 0.87 %
Date Time Interrogation Session: 20231002142737
Implantable Lead Implant Date: 20211011
Implantable Lead Implant Date: 20211011
Implantable Lead Location: 753859
Implantable Lead Location: 753860
Implantable Pulse Generator Implant Date: 20211011
Lead Channel Impedance Value: 387.5 Ohm
Lead Channel Impedance Value: 475 Ohm
Lead Channel Pacing Threshold Amplitude: 0.75 V
Lead Channel Pacing Threshold Amplitude: 0.75 V
Lead Channel Pacing Threshold Amplitude: 0.75 V
Lead Channel Pacing Threshold Amplitude: 0.75 V
Lead Channel Pacing Threshold Pulse Width: 0.5 ms
Lead Channel Pacing Threshold Pulse Width: 0.5 ms
Lead Channel Pacing Threshold Pulse Width: 0.5 ms
Lead Channel Pacing Threshold Pulse Width: 0.5 ms
Lead Channel Sensing Intrinsic Amplitude: 2 mV
Lead Channel Sensing Intrinsic Amplitude: 9 mV
Lead Channel Setting Pacing Amplitude: 2 V
Lead Channel Setting Pacing Amplitude: 2 V
Lead Channel Setting Pacing Pulse Width: 0.5 ms
Lead Channel Setting Sensing Sensitivity: 2 mV
Pulse Gen Model: 2272
Pulse Gen Serial Number: 3869163

## 2022-09-12 NOTE — Progress Notes (Signed)
Electrophysiology Office Note   Date:  09/12/2022   ID:  Todd Salazar, DOB [birth year], MRN [id number]  PCP:  [name], DO  Cardiologist:   Primary Electrophysiologist:  Conall Vangorder Meredith Leeds, MD    Chief Complaint: AF   History of Present Illness: Todd Salazar is a 86 y.o. male who is being seen today for the evaluation of AF at the request of [name], DO. Presenting today for electrophysiology evaluation.  He has a history seen for atrial fibrillation/atrial flutter, but bronchiectasis with restrictive lung disease, symptomatic bradycardia post [employer] Jude dual-chamber pacemaker, orthostatic hypotension, prostate cancer, CKD stage III.  He presented to the hospital September 2022 in atrial fibrillation and has since been loaded on dofetilide.  He is post atrial fibrillation ablation 10/29/2021.  Post ablation he presented emergency room with pericarditis and was started on colchicine.  Today, denies symptoms of palpitations, chest pain, shortness of breath, orthopnea, PND, lower extremity edema, claudication, dizziness, presyncope, syncope, bleeding, or neurologic sequela. The patient is tolerating medications without difficulties.  Over the last week, he has been more fatigued.  He does not have shortness of breath.  He is unsure as to why this is occurring.  He is fatigue doing most daily activities.  He had 2 to 3 days last week that he felt severely fatigued, but he has been improving over the last 4 or so days.   Past Medical History:  Diagnosis Date   Arthritis    Atrial fibrillation ([place])    Cancer ([place])    prostate   Dysrhythmia    afib   History of kidney stones    Past Surgical History:  Procedure Laterality Date   ATRIAL FIBRILLATION ABLATION N/A 10/29/2021   Procedure: ATRIAL FIBRILLATION ABLATION;  Surgeon: Constance Haw, MD;  Location: [facility];  Service: Cardiovascular;  Laterality: N/A;   Bone spur     CARDIOVERSION N/A 06/01/2022    Procedure: CARDIOVERSION;  Surgeon: Donato Heinz, MD;  Location: [facility] ENDOSCOPY;  Service: Cardiovascular;  Laterality: N/A;   CHOLECYSTECTOMY     HERNIA REPAIR     JOINT REPLACEMENT     kidney stones     LAMINECTOMY WITH POSTERIOR LATERAL ARTHRODESIS LEVEL 1 Right 11/25/2019   Procedure: Laminectomy and Foraminotomy  Lumbar Four-Lumbar Five - right, with instrumented fusion;  Surgeon: Eustace Moore, MD;  Location: [facility];  Service: Neurosurgery;  Laterality: Right;  Laminectomy and Foraminotomy  Lumbar Four-Lumbar Five - right, with instrumented fusion   PROSTATE SURGERY     TEE WITHOUT CARDIOVERSION N/A 06/01/2022   Procedure: TRANSESOPHAGEAL ECHOCARDIOGRAM (TEE);  Surgeon: Donato Heinz, MD;  Location: [facility] ENDOSCOPY;  Service: Cardiovascular;  Laterality: N/A;     Current Outpatient Medications  Medication Sig Dispense Refill   ADVIL 200 MG CAPS Take 200-400 mg by mouth every 6 (six) hours as needed (for headaches or mild pain).     apixaban (ELIQUIS) 5 MG TABS tablet Take 1 tablet (5 mg total) by mouth 2 (two) times daily. 180 tablet 1   gabapentin (NEURONTIN) 100 MG capsule Take 200 mg by mouth at bedtime as needed. As directed     No current facility-administered medications for this visit.    Allergies:   Ciprofloxacin, Tramadol, Duloxetine, Hydrocodone-acetaminophen, Nisoldipine, Benadryl [diphenhydramine], Methocarbamol, Ondansetron hcl, and Sulfa antibiotics   Social History:  The patient  reports that he quit smoking about 25 years ago. His smoking use included cigarettes. He has never used  smokeless tobacco. He reports that he does not drink alcohol and does not use drugs.   Family History:  The patient's family history includes Hypertension in his father.   ROS:  Please see the history of present illness.   Otherwise, review of systems is positive for none.   All other systems are reviewed and negative.   PHYSICAL EXAM: VS:  BP 122/60   Pulse 79   Ht 6'  3" (1.905 m)   Wt 185 lb 1.9 oz (84 kg)   SpO2 97%   BMI 23.14 kg/m  , BMI Body mass index is 23.14 kg/m. GEN: Well nourished, well developed, in no acute distress  HEENT: normal  Neck: no JVD, carotid bruits, or masses Cardiac: irregular; no murmurs, rubs, or gallops,no edema  Respiratory:  clear to auscultation bilaterally, normal work of breathing GI: soft, nontender, nondistended, + BS MS: no deformity or atrophy  Skin: warm and dry, device site well healed Neuro:  Strength and sensation are intact Psych: euthymic mood, full affect  EKG:  EKG is ordered today. Personal review of the ekg ordered shows sinus rhythm, PVCs  Personal review of the device interrogation today. Results in [place]: 01/12/2022: NT-Pro BNP 261 05/30/2022: ALT 19; B Natriuretic Peptide 256.1; Magnesium 2.1 05/31/2022: BUN 13; Creatinine, Ser 1.26; Hemoglobin 9.6; Platelets 384; Potassium 4.3; Sodium 139; TSH 0.946    Lipid Panel     Component Value Date/Time   CHOL 174 12/11/2011 0500   TRIG 123 12/11/2011 0500   HDL 29 (L) 12/11/2011 0500   CHOLHDL 6.0 12/11/2011 0500   VLDL 25 12/11/2011 0500   LDLCALC 120 (H) 12/11/2011 0500     Wt Readings from Last 3 Encounters:  09/12/22 185 lb 1.9 oz (84 kg)  06/15/22 177 lb 6.4 oz (80.5 kg)  05/31/22 174 lb 14.4 oz (79.3 kg)      Other studies Reviewed: Additional studies/ records that were reviewed today include: TTE 08/19/21  Review of the above records today demonstrates:   1. Left ventricular ejection fraction, by estimation, is 55 to 60%. The  left ventricle has normal function. The left ventricle has no regional  wall motion abnormalities. There is mild left ventricular hypertrophy.  Left ventricular diastolic parameters  were normal.   2. Right ventricular systolic function is normal. The right ventricular  size is mildly enlarged. There is normal pulmonary artery systolic  pressure. The estimated right ventricular systolic  pressure is 67.8 mmHg.   3. Right atrial size was mildly dilated.   4. The mitral valve is normal in structure. Trivial mitral valve  regurgitation.   5. Tricuspid valve regurgitation is mild to moderate.   6. The aortic valve is tricuspid. Aortic valve regurgitation is not  visualized. No aortic stenosis is present.   7. The inferior vena cava is normal in size with greater than 50%  respiratory variability, suggesting right atrial pressure of 3 mmHg.    ASSESSMENT AND PLAN:  1.  Persistent atrial fibrillation/atrial flutter: CHA2DS2-VASc of 2.  Currently on Eliquis 5 mg twice daily.  Less than 1% burden on monitor.  We Maleko Greulich continue with current management.  2.  Restrictive lung disease: Patient without shortness of breath.  Attempting to avoid amiodarone.  3.  Symptomatic bradycardia: Status post [facility] Jude dual-chamber pacemaker.  Device function appropriately.  No changes at this time.  4.  Secondary hypercoagulable state: Currently on Eliquis for atrial fibrillation as above.  5.  PVCs:  Patient has been fatigued and weak over the last 7 days.  Could certainly be due to an elevated PVC burden.  He has a minimal burden of atrial fibrillation.  We Sadee Osland plan for a 7-day monitor.  Current medicines are reviewed at length with the patient today.   The patient does not have concerns regarding his medicines.  The following changes were made today: none  Labs/ tests ordered today include:  Orders Placed This Encounter  Procedures   LONG TERM MONITOR (3-14 DAYS)   CUP PACEART INCLINIC DEVICE CHECK   EKG 12-Lead      Disposition:   FU 6 months  Signed, Todd Corning Meredith Leeds, MD  09/12/2022 2:43 PM     [facility] [address] [place] [address] [phone] (office) [phone] (fax)

## 2022-09-12 NOTE — Patient Instructions (Addendum)
Medication Instructions:  Your physician recommends that you continue on your current medications as directed. Please refer to the Current Medication list given to you today.  *If you need a refill on your cardiac medications before your next appointment, please call your pharmacy*   Lab Work: None ordered   Testing/Procedures:                           ZIO XT- Long Term Monitor Instructions  Your physician has requested you wear a ZIO patch monitor for 7 days.  This is a single patch monitor. [employer] supplies one patch monitor per enrollment. Additional stickers are not available. Please do not apply patch if you will be having a Nuclear Stress Test,  Echocardiogram, Cardiac CT, MRI, or Chest Xray during the period you would be wearing the  monitor. The patch cannot be worn during these tests. You cannot remove and re-apply the  ZIO XT patch monitor.  Your ZIO patch monitor will be mailed 3 day USPS to your address on file. It may take 3-5 days  to receive your monitor after you have been enrolled.  Once you have received your monitor, please review the enclosed instructions. Your monitor  has already been registered assigning a specific monitor serial # to you.  Billing and Patient Assistance Program Information  We have supplied [employer] with any of your insurance information on file for billing purposes. [employer] offers a sliding scale Patient Assistance Program for patients that do not have  insurance, or whose insurance does not completely cover the cost of the ZIO monitor.  You must apply for the Patient Assistance Program to qualify for this discounted rate.  To apply, please call [employer] at [phone], select option 4, select option 2, ask to apply for  Patient Assistance Program. Theodore Demark will ask your household income, and how many people  are in your household. They will quote your out-of-pocket cost based on that information.  [employer] will also be able to set up a  45-month interest-free payment plan if needed.  Applying the monitor   Shave hair from upper left chest.  Hold abrader disc by orange tab. Rub abrader in 40 strokes over the upper left chest as  indicated in your monitor instructions.  Clean area with 4 enclosed alcohol pads. Let dry.  Apply patch as indicated in monitor instructions. Patch will be placed under collarbone on left  side of chest with arrow pointing upward.  Rub patch adhesive wings for 2 minutes. Remove white label marked "1". Remove the white  label marked "2". Rub patch adhesive wings for 2 additional minutes.  While looking in a mirror, press and release button in center of patch. A small green light will  flash 3-4 times. This will be your only indicator that the monitor has been turned on.  Do not shower for the first 24 hours. You may shower after the first 24 hours.  Press the button if you feel a symptom. You will hear a small click. Record Date, Time and  Symptom in the Patient Logbook.  When you are ready to remove the patch, follow instructions on the last 2 pages of Patient  Logbook. Stick patch monitor onto the last page of Patient Logbook.  Place Patient Logbook in the blue and white box. Use locking tab on box and tape box closed  securely. The blue and white box has prepaid postage on it. Please place it in the  mailbox as  soon as possible. Your physician should have your test results approximately 7 days after the  monitor has been mailed back to [facility].  Call [facility] at [phone] if you have questions regarding  your ZIO XT patch monitor. Call them immediately if you see an orange light blinking on your  monitor.  If your monitor falls off in less than 4 days, contact our Monitor department at [phone].  If your monitor becomes loose or falls off after 4 days call [employer] at [phone] for  suggestions on securing your monitor     Follow-Up: At [facility], you and your health needs are our priority.  As part of our continuing mission to provide you with exceptional heart care, we have created designated Provider Care Teams.  These Care Teams include your primary Cardiologist (physician) and Advanced Practice Providers (APPs -  Physician Assistants and Nurse Practitioners) who all work together to provide you with the care you need, when you need it.  Your next appointment:   6 month(s)  The format for your next appointment:   In Person  Provider:   Allegra Lai, MD    Thank you for choosing [facility]!!   Trinidad Curet, RN [phone]  Other Instructions   Important Information About [employer]

## 2022-09-12 NOTE — Progress Notes (Unsigned)
Enrolled for [employer] to mail a ZIO XT long term holter monitor to the patients address on file.   Dr. Camnitz to read. 

## 2022-09-13 IMAGING — CT CT L SPINE W/O CM
3 of 5 series · 12 of 33 positions shown, 14 images · non-contrast
Comparison: June 16, 2020

CLINICAL DATA: Severe right hip pain



[Series 3: axial st lumbar (person_name) · axial · 0.35mm/px · z∈[-388,-148]mm · 6 of 157 slices shown, 8 images]
[im 25/157  soft-tissue]
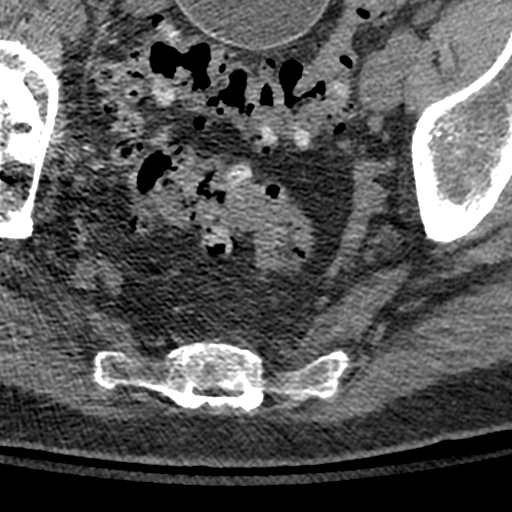
[im 25/157  bone]
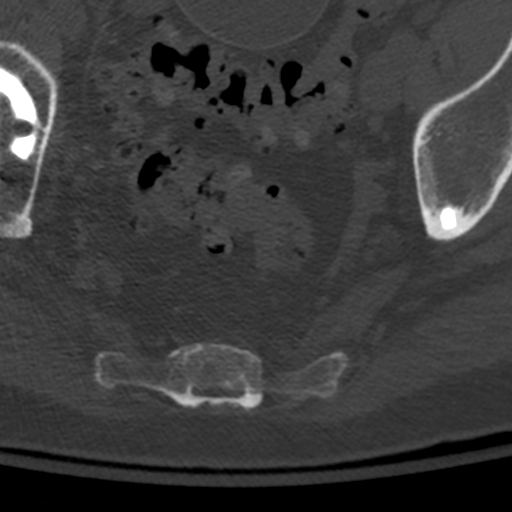
[im 49/157  bone]
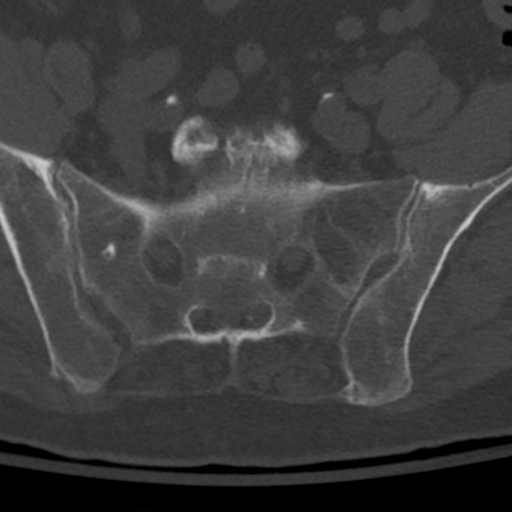
[im 73/157  bone]
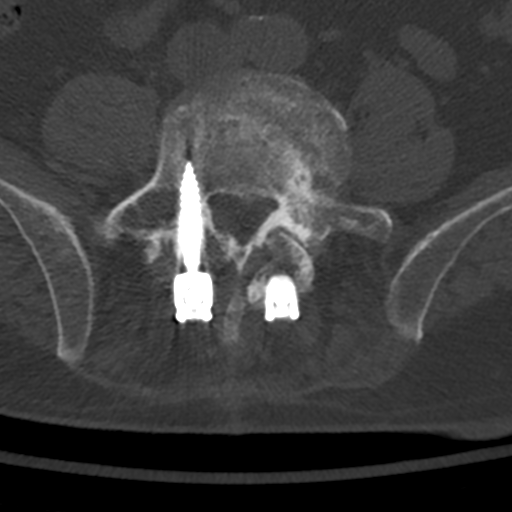
[im 97/157  bone]
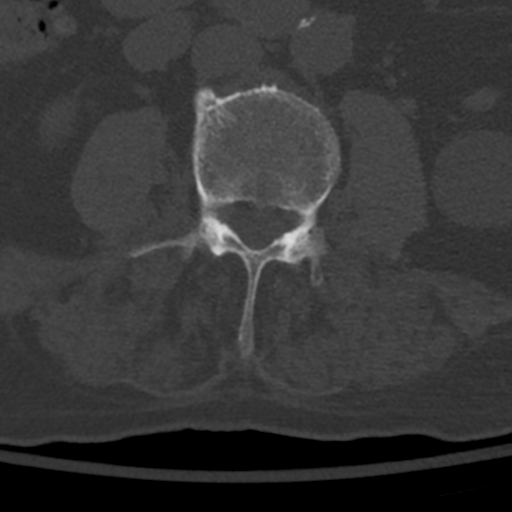
[im 121/157  soft-tissue]
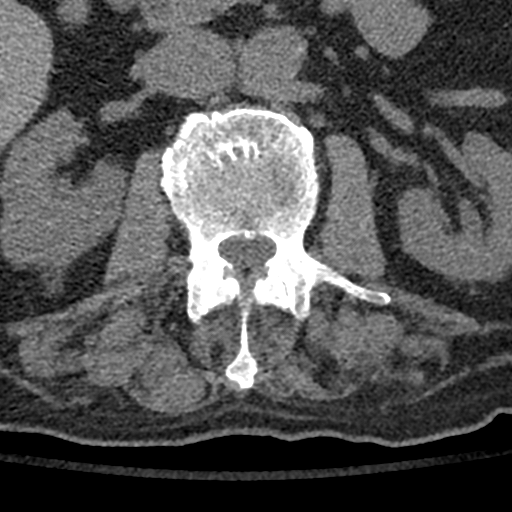
[im 121/157  bone]
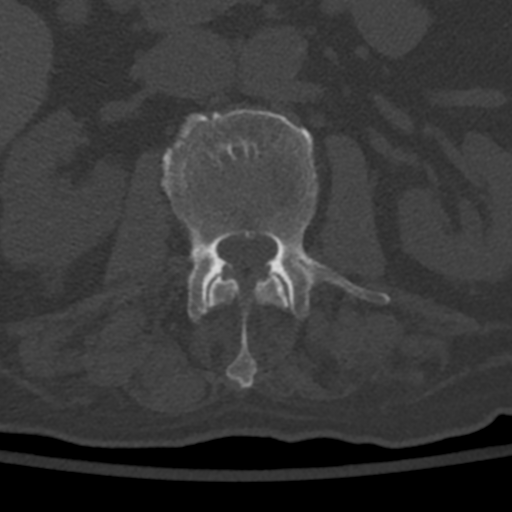
[im 145/157  bone]
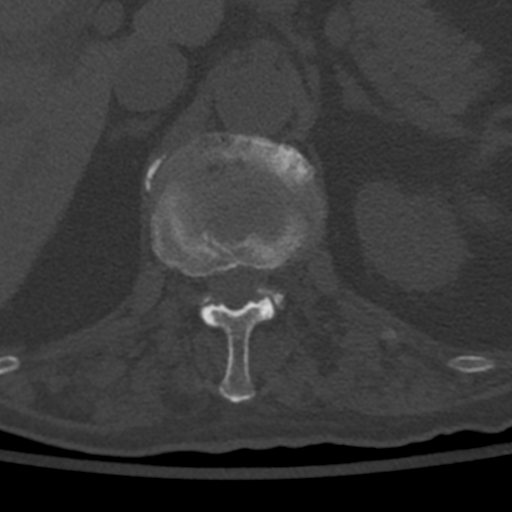

[Series 5: sagittal lumbar bone · sagittal · 0.38mm/px · 5 of 110 slices shown]
[im 19/110  bone]
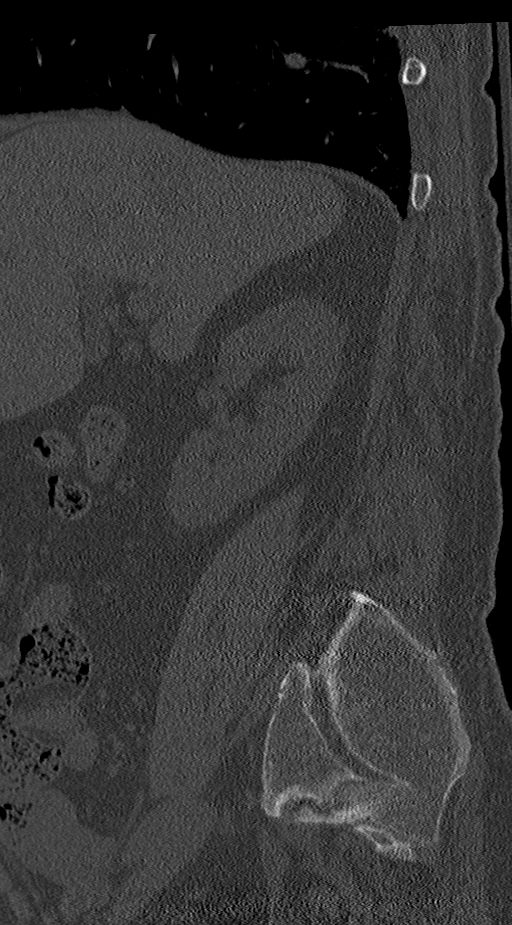
[im 37/110  bone]
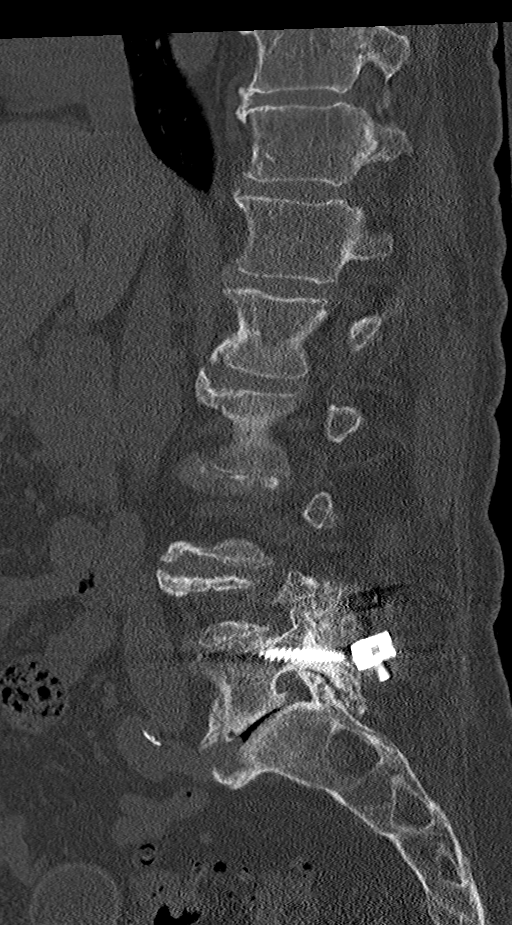
[im 55/110  bone]
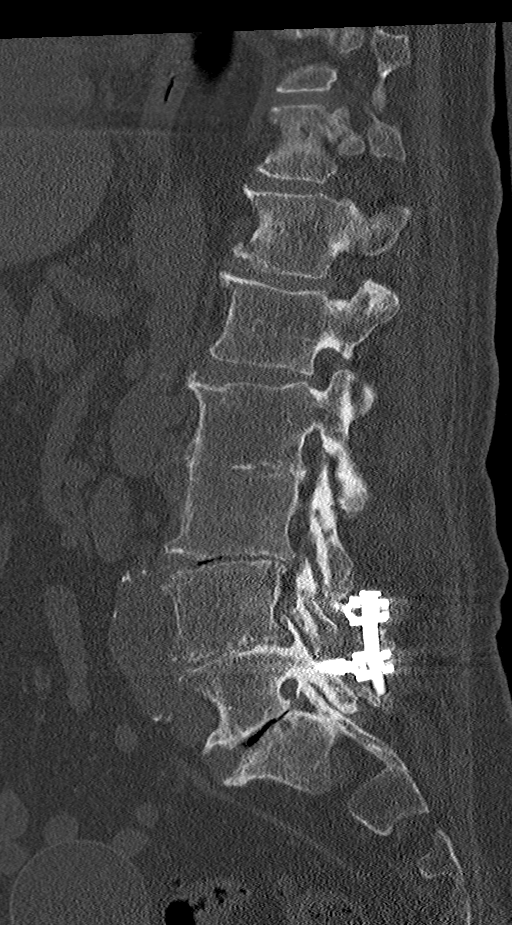
[im 73/110  bone]
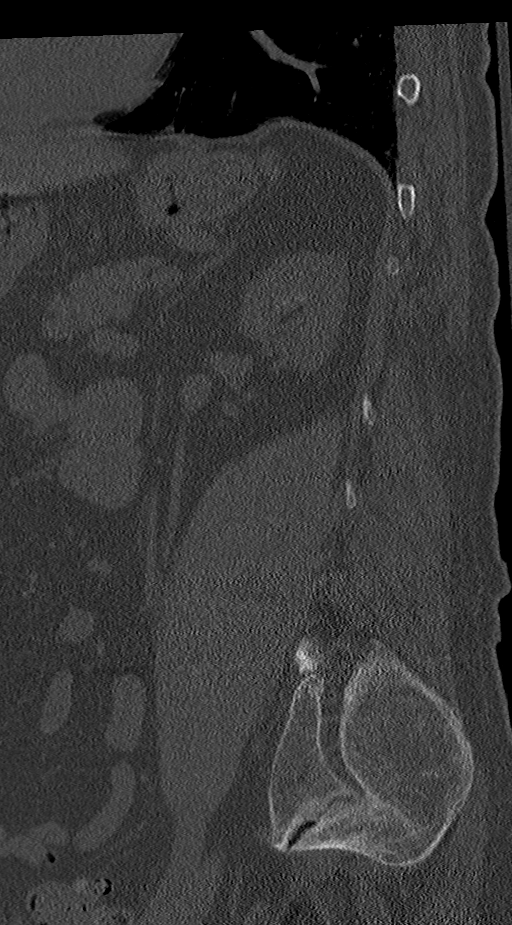
[im 91/110  bone]
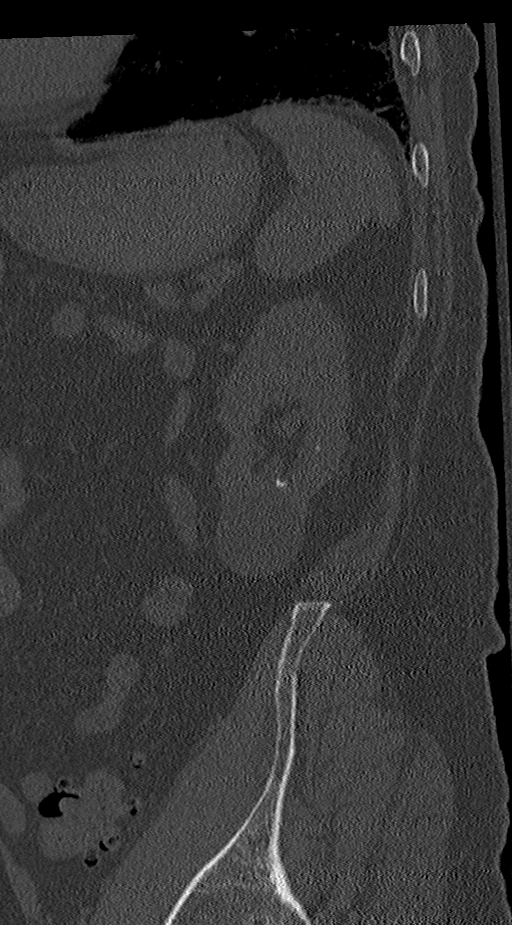

[Series 5: coronal st · coronal · 0.75mm/px · 1 of 97 slices shown]
[im 49/97  bone]
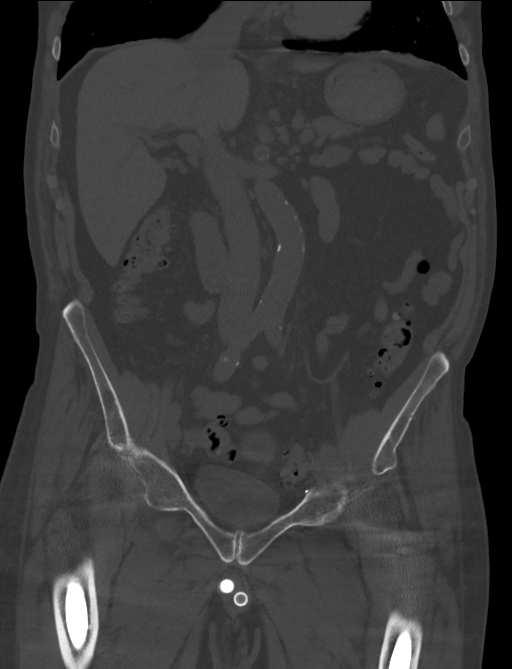

[12 of 33 positions shown; findings below may reference images not displayed]

FINDINGS: Segmentation: 5 lumbar type vertebrae.

Alignment: Similar to prior.

Vertebrae: Decreased osseous mineralization. Vertebral body heights
are similar. Postoperative changes are again identified at L5-S1
with rods and pedicle screws and right laminectomy.

Paraspinal and other soft tissues: Partially imaged bilateral hip
arthroplasties. Nonobstructing left renal calculi. Atherosclerosis.
Sigmoid diverticulosis.

Disc levels:

L1-L2:  No significant stenosis.

L2-L3: Bridging bone across the disc space. Endplate osteophytes.
Facet hypertrophy. No significant canal stenosis. Minor foraminal
stenosis.

L3-L4: Mild retrolisthesis. Disc bulge with endplate osteophytic
ridging. Facet hypertrophy with ligamentum flavum thickening. Mild
canal stenosis. Moderate right and mild to moderate left foraminal
stenosis.

L4-L5: Operative level. Endplate osteophytes. Facet hypertrophy with
residual ligamentum flavum thickening. No canal stenosis. Moderate
foraminal stenosis.

L5-S1: Disc bulge with endplate osteophytic ridging. Probable
superimposed left central/subarticular disc extrusion extending
above disc level with vacuum phenomenon. Facet arthropathy with
ligamentum flavum thickening. No canal stenosis. Mild to moderate
foraminal stenosis.
IMPRESSION: No acute osseous abnormality.

Degenerative and postoperative changes as detailed above. No
substantial progression since 6762 examination.

## 2022-09-13 IMAGING — CT CT RENAL STONE PROTOCOL
2 of 4 series · 16 of 46 positions shown, 18 images · non-contrast
Comparison: October 31, 2021.

CLINICAL DATA: Right flank pain without known injury.



[Series 2: axial (person_name) (person_name) · axial · 0.91mm/px · z∈[-528,-78]mm · 13 of 100 slices shown, 15 images]
[im 5/100  soft-tissue]
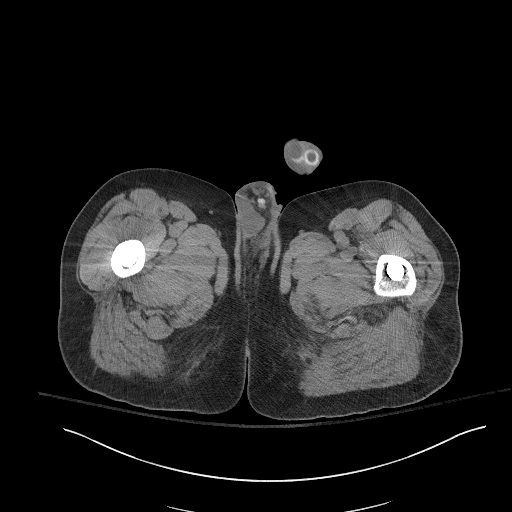
[im 5/100  bone]
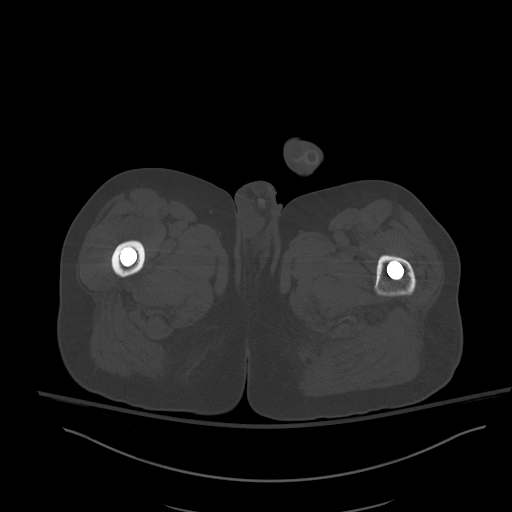
[im 13/100  soft-tissue]
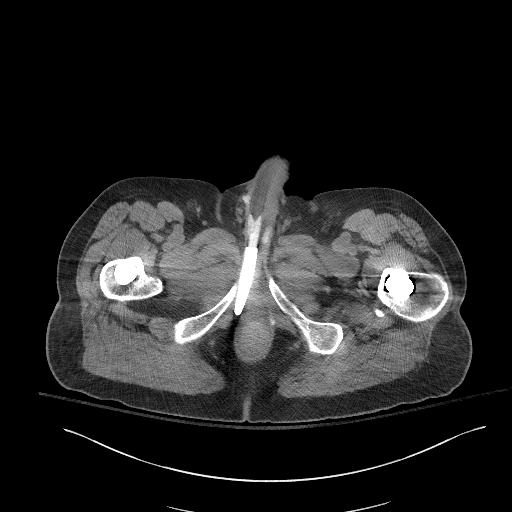
[im 21/100  soft-tissue]
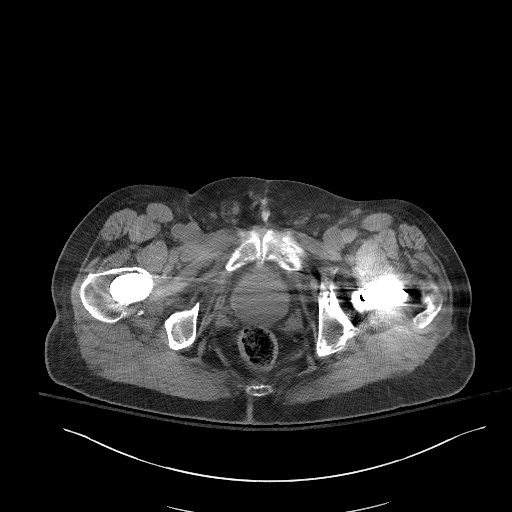
[im 29/100  soft-tissue]
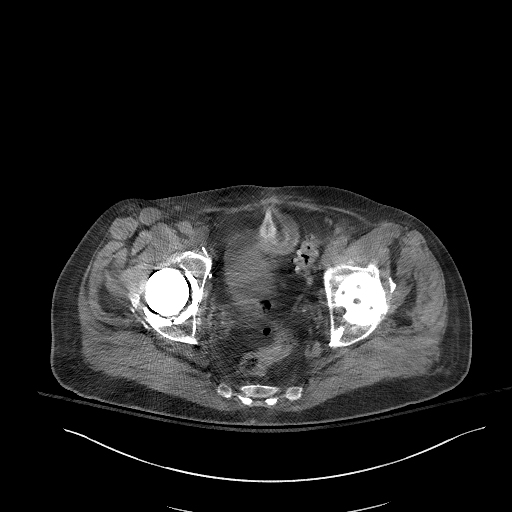
[im 34/100  soft-tissue]
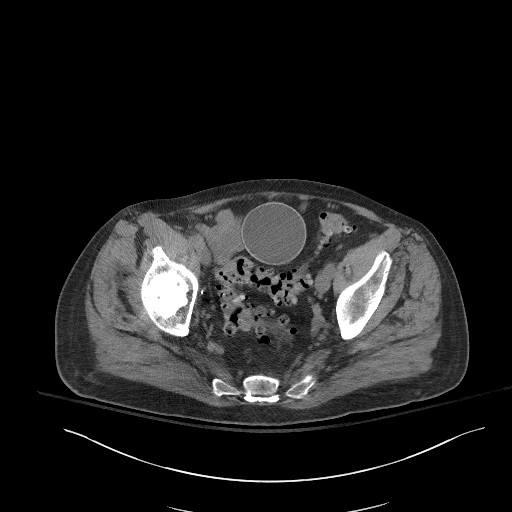
[im 42/100  soft-tissue]
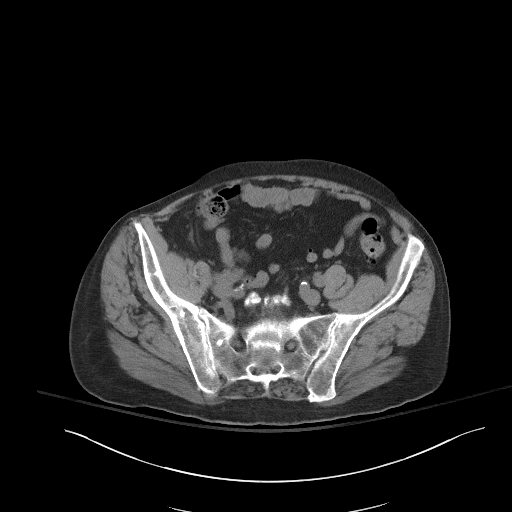
[im 50/100  soft-tissue]
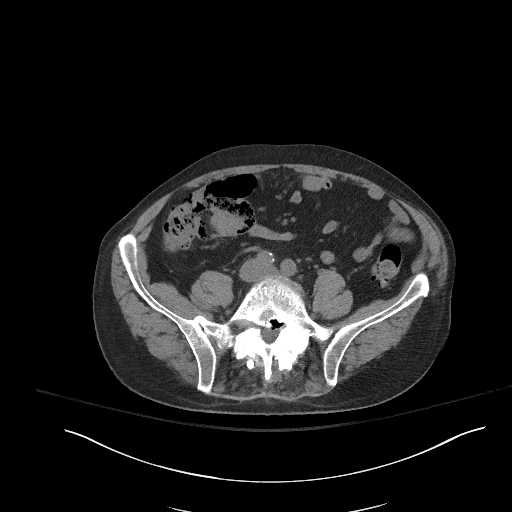
[im 58/100  soft-tissue]
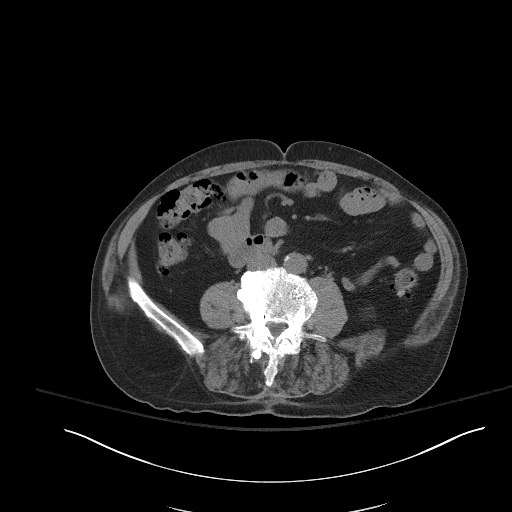
[im 67/100  soft-tissue]
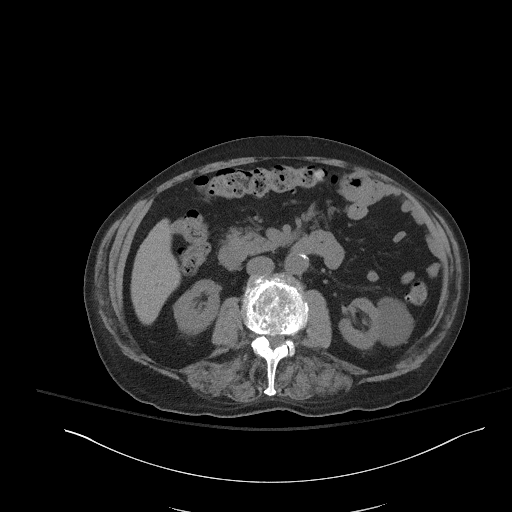
[im 67/100  bone]
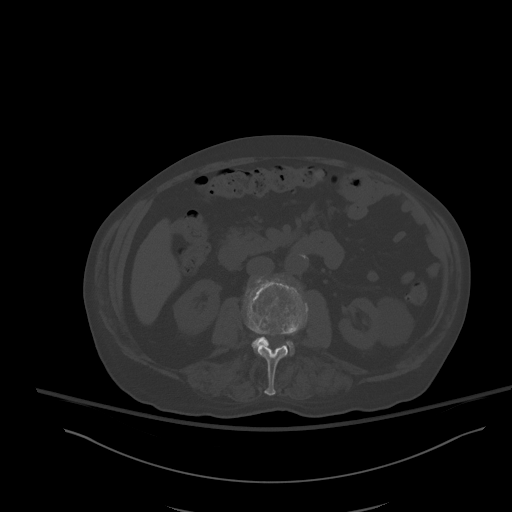
[im 71/100  soft-tissue]
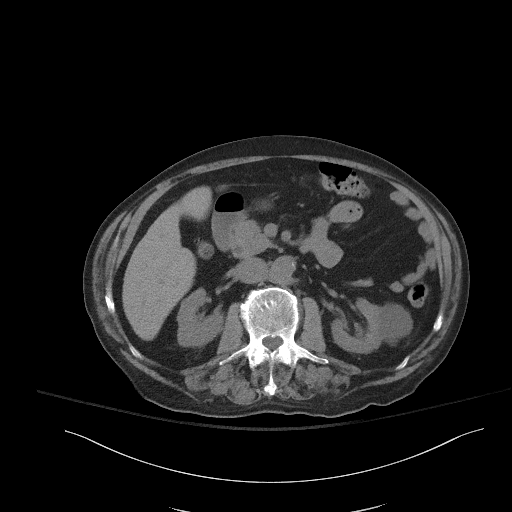
[im 79/100  soft-tissue]
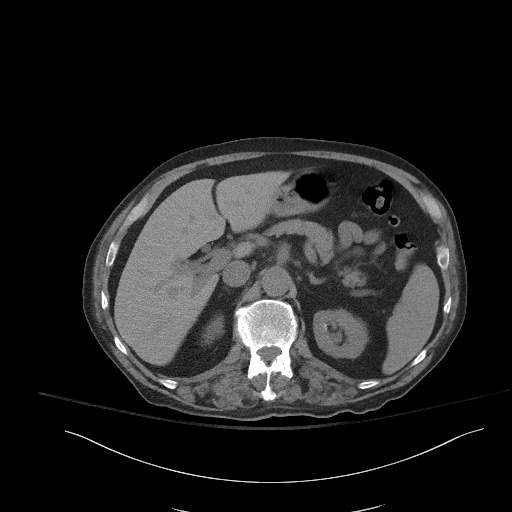
[im 87/100  soft-tissue]
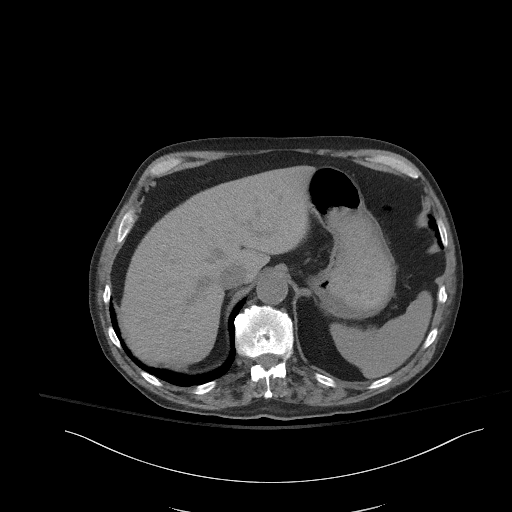
[im 95/100  soft-tissue]
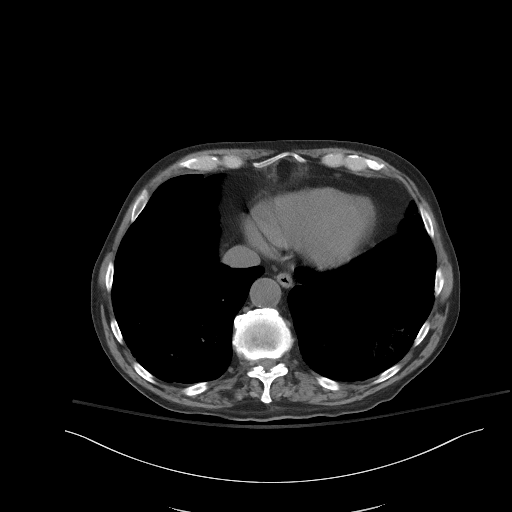

[Series 5: coronal st · coronal · 0.75mm/px · 3 of 97 slices shown]
[im 33/97  soft-tissue]
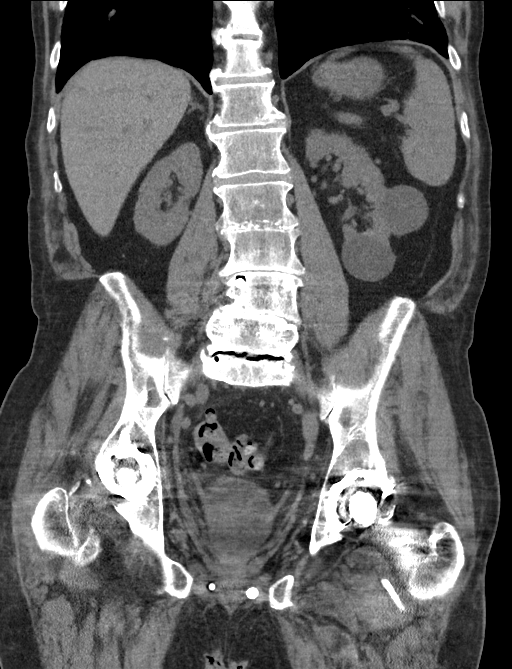
[im 43/97  soft-tissue]
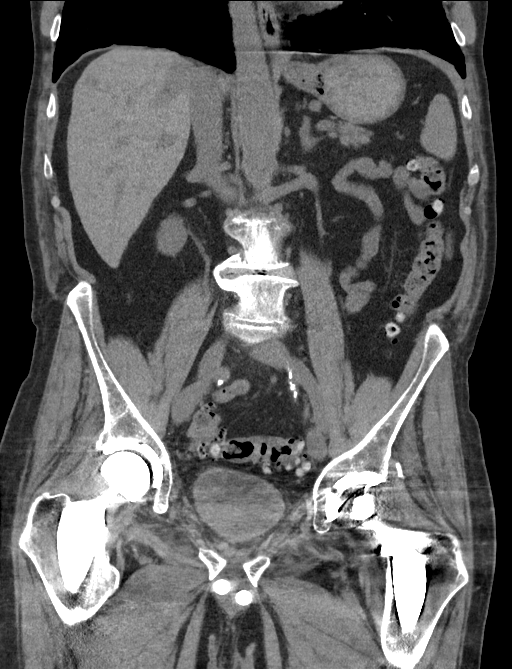
[im 54/97  soft-tissue]
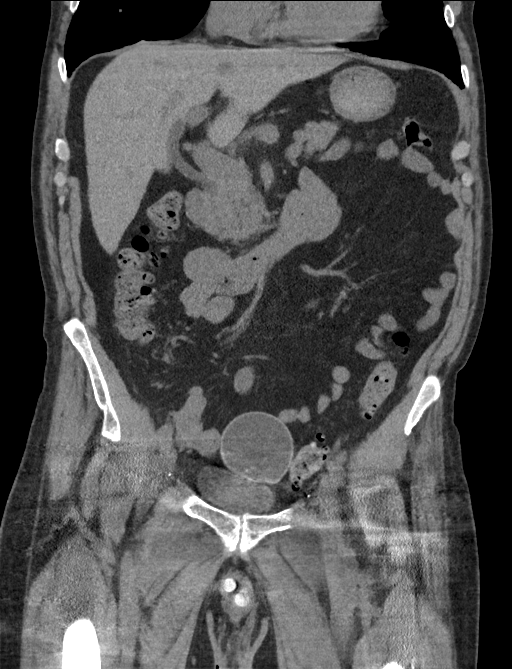

[16 of 46 positions shown; findings below may reference images not displayed]

FINDINGS: Lower chest: No acute abnormality.

Hepatobiliary: No focal liver abnormality is seen. Status post
cholecystectomy. No biliary dilatation.

Pancreas: Unremarkable. No pancreatic ductal dilatation or
surrounding inflammatory changes.

Spleen: Normal in size without focal abnormality.

Adrenals/Urinary Tract: Adrenal glands appear normal. Left
nephrolithiasis is noted. No hydronephrosis or renal obstruction is
noted. Urinary bladder is unremarkable.

Stomach/Bowel: Stomach is within normal limits. Appendix appears
normal. No evidence of bowel wall thickening, distention, or
inflammatory changes. Diverticulosis is noted throughout the colon
without inflammation.

Vascular/Lymphatic: Aortic atherosclerosis. No enlarged abdominal or
pelvic lymph nodes.

Reproductive: Prostate is not well visualized due to scatter
artifact arising from bilateral hip arthroplasties. Penile implant
reservoir is noted anteriorly in the pelvis.

Other: No abdominal wall hernia or abnormality. No abdominopelvic
ascites.

Musculoskeletal: Status post bilateral total hip arthroplasties. No
acute osseous abnormality is noted.
IMPRESSION: Nonobstructive left nephrolithiasis. No hydronephrosis or renal
obstruction is noted.

Diverticulosis is noted throughout the colon without inflammation.

No acute abnormality seen in the abdomen or pelvis.

Aortic Atherosclerosis ([id number]).

## 2022-09-13 IMAGING — DX DG HIP (WITH OR WITHOUT PELVIS) 2-3V*R*
3 series · 3 of 3 positions shown · non-contrast
Comparison: Radiographs 07/25/2019.

CLINICAL DATA: Sudden onset of right hip pain 3 days ago with
difficulty walking and weight-bearing. No known recent injury.

EXAM:
DG HIP (WITH OR WITHOUT PELVIS) 2-3V RIGHT

[pelvis ap]
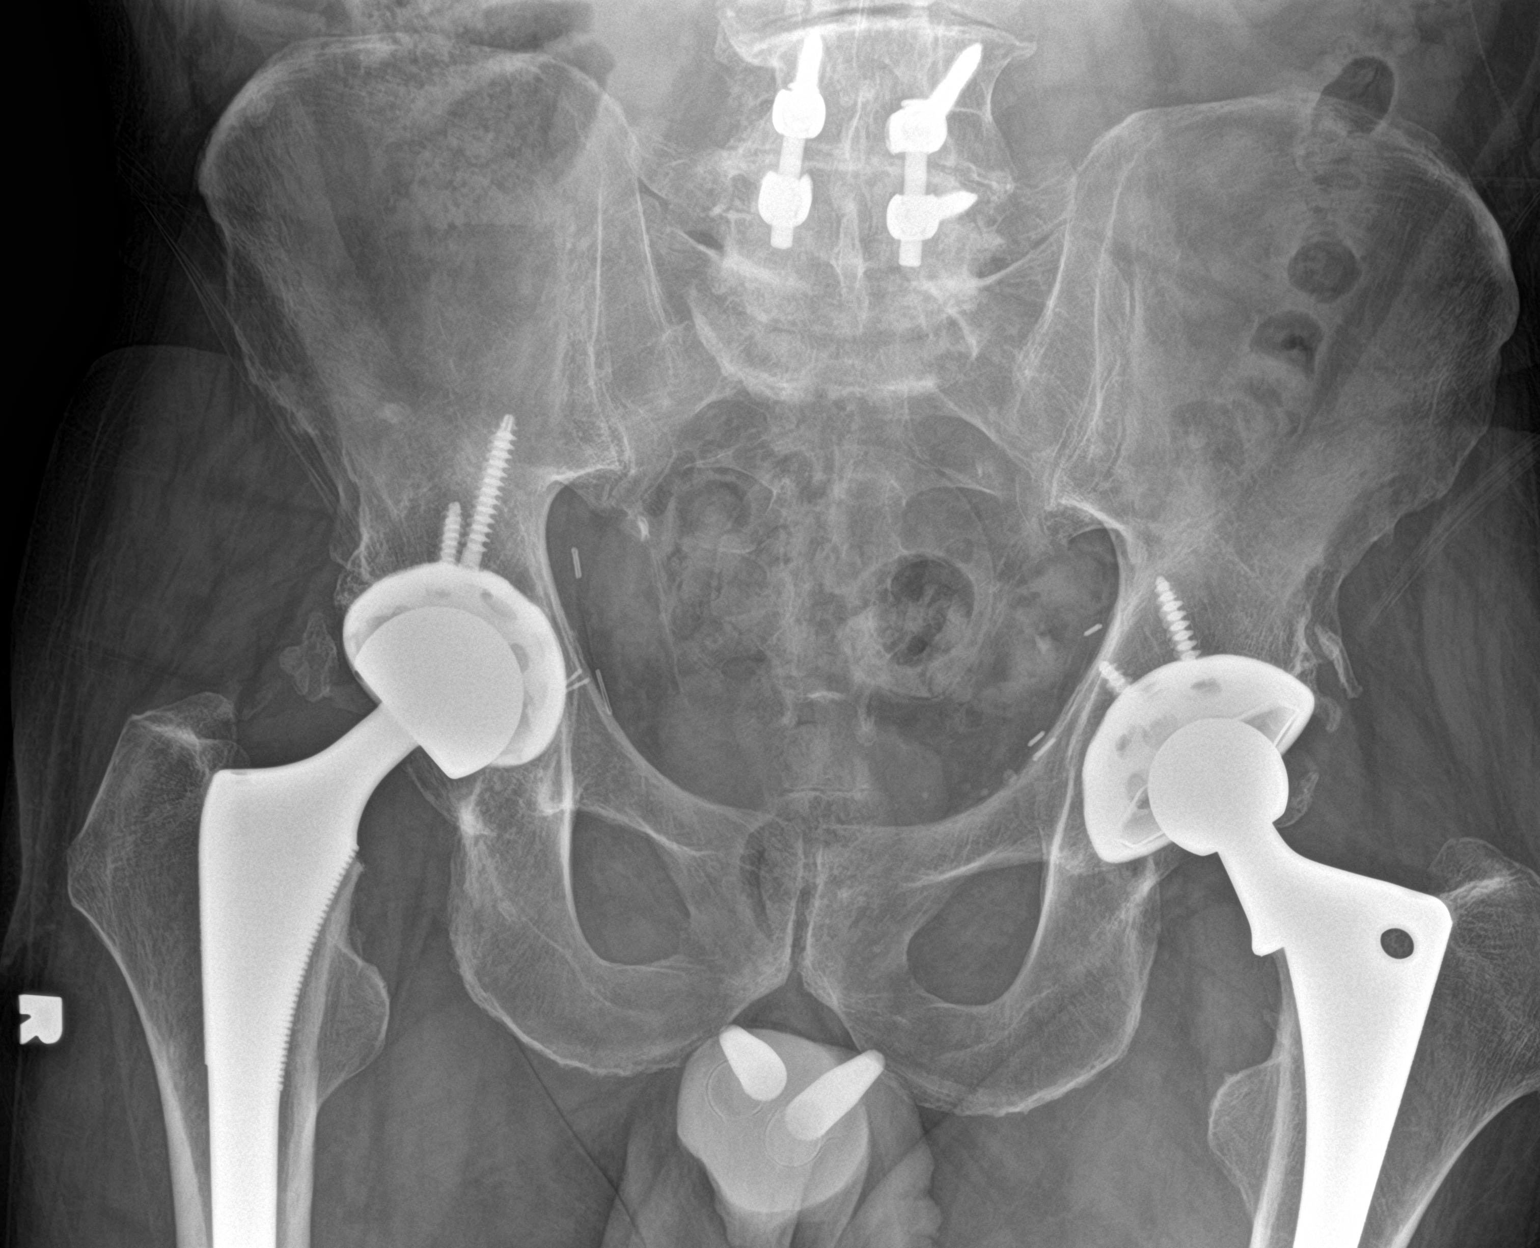

[hip lat]
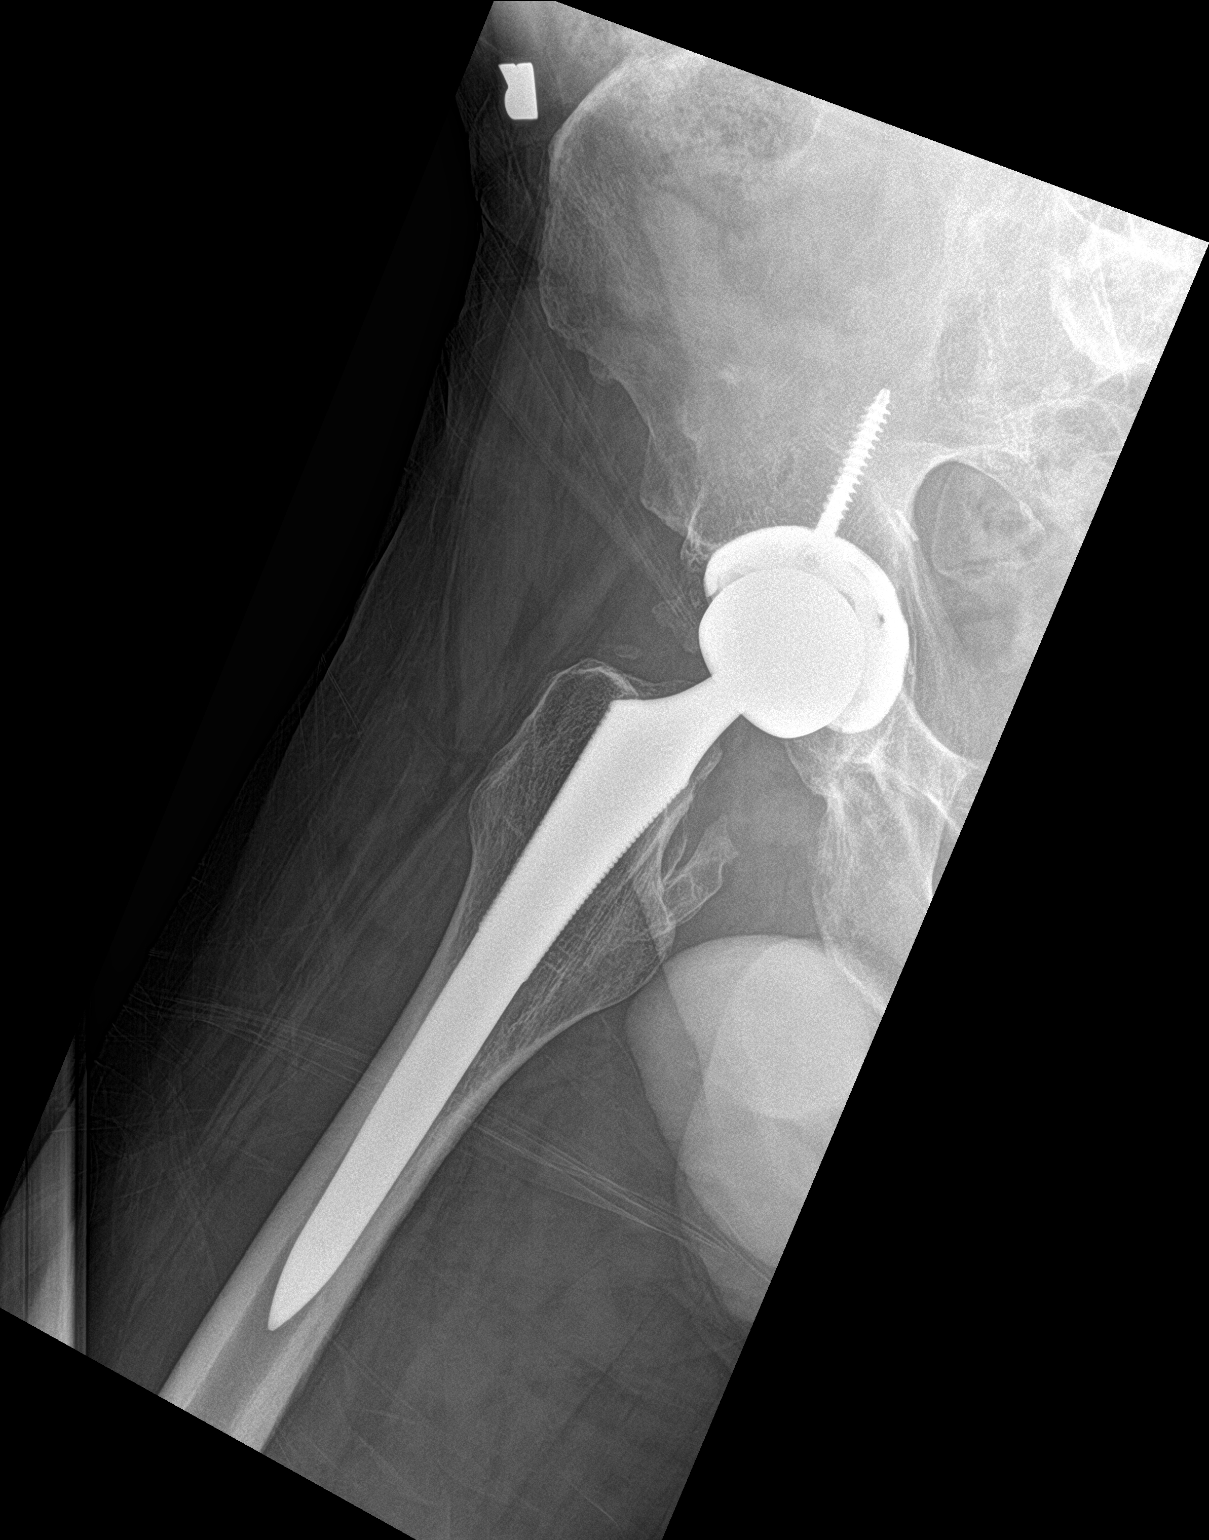

[hip ap]
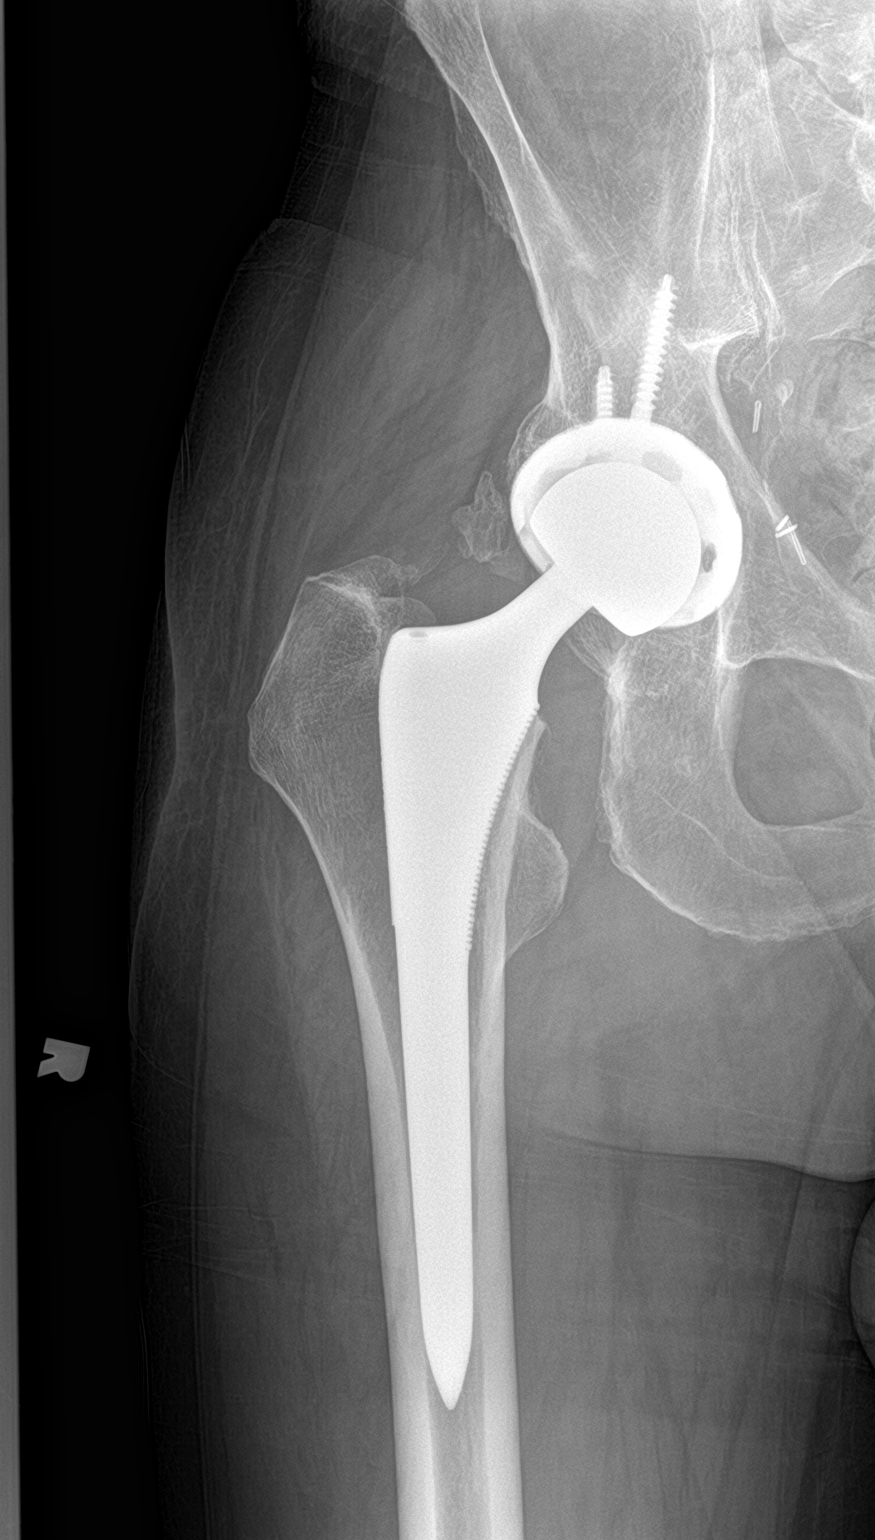

[3 of 3 positions shown; findings below may reference images not displayed]

FINDINGS: Status post bilateral total hip arthroplasty with screw fixed
acetabular component. On the right, there is no evidence of acute
fracture, dislocation or hardware loosening. The left femoral
prosthesis is incompletely visualized, although appears grossly
unchanged. Interval lower lumbar fusion without demonstrated
complication. Penile prosthesis noted.
IMPRESSION: No acute findings or explanation for the patient's symptoms.
Postsurgical changes as described.

## 2022-09-14 NOTE — Progress Notes (Signed)
Remote pacemaker transmission.   

## 2022-09-16 DIAGNOSIS — I493 Ventricular premature depolarization: Secondary | ICD-10-CM | POA: Diagnosis not present

## 2022-09-16 DIAGNOSIS — I480 Paroxysmal atrial fibrillation: Secondary | ICD-10-CM | POA: Diagnosis not present

## 2022-09-30 ENCOUNTER — Encounter: Payer: Self-pay | Admitting: Cardiology

## 2022-09-30 NOTE — Telephone Encounter (Signed)
Pt has had episodes where he get dizzy and experiences near syncope. Pt mailed in the monitor last Friday. Aware that we may have to wait for that result before making a determination. Advised to call over the weekend/after hours if worsens or go to ED if needed. Aware I will forward to Dr. Curt Bears for advisement. Pt agreeable to plan.

## 2022-10-12 NOTE — Progress Notes (Unsigned)
Cardiology Office Note Date:  10/12/2022  Patient ID:  Todd Salazar, Todd Salazar August 19, 1936, MRN [id number] PCP:  [name], DO  Cardiologist:  Dr. Minna Merritts Electrophysiologist: Dr. Curt Bears  ***refresh   Chief Complaint: *** PVCs  History of Present Illness: Todd Salazar is a 86 y.o. male with history of of persistent Afib, Atach, bronchiectasis/restrictive lung disease, symptomatic bradycardia with PPM, orthostatic hypotension, prostate cancer, CKD (III).  Hospitalized a couple times last fall with AFib RVR, had been previously mentioned by Dr. Minna Merritts that he may need AV node ablation, but the pt hoping to avoid that. Having previously failed sotalol, flecainide, and propafenone, and apparently intolerant of nodal blocking agents He was started on Tikosyn Sept 2022though quickly back in the hospital with RVR though did have spontaneous CV to SR and planned to continue Tikosyn and follow up outpt.  PVI ablation 10/29/21 Did have an ER visit with CP, CT chest was OK, felt to be pericarditis 2/2 ablation and given a regime of colchicine.  Admitted 05/30/22 with AFlutter/RVR (s/p back surgery on 6/5) TEE/DCCV 6/21 Discharged 06/01/22  Saw the Af clinic July, doing well  Saw Dr. Curt Bears 09/12/22, c/o a new unusual fatigue, EKG was SR with PVCs. AFib burden <1% Planned for a monitor to assess burden  Dominant rhythm is sinus rhythm with intermittent atrial and ventricular pacing 110 SVT episodes, longest and fastest 40 minutes at 125 bpm 11% PVC burden No triggered episodes recorded  Pt reached out still not feeling well at all, and advised to come in for discussion on next steps for his PVC management  ***   Device information ***  AAD Hx failed sotalol, flecainide, and propafenone, and apparently intolerant of nodal blocking agents Tikosyn Sept 2022 >>failed to maintain SR stopped Sept 2022 PVI ablation 10/29/2021 Amiodarone started Sept 2022 >> stopped post PVI  ablation  Past Medical History:  Diagnosis Date   Arthritis    Atrial fibrillation ([place])    Cancer ([place])    prostate   Dysrhythmia    afib   History of kidney stones     Past Surgical History:  Procedure Laterality Date   ATRIAL FIBRILLATION ABLATION N/A 10/29/2021   Procedure: ATRIAL FIBRILLATION ABLATION;  Surgeon: Constance Haw, MD;  Location: [place] CV LAB;  Service: Cardiovascular;  Laterality: N/A;   Bone spur     CARDIOVERSION N/A 06/01/2022   Procedure: CARDIOVERSION;  Surgeon: Donato Heinz, MD;  Location: [facility] ENDOSCOPY;  Service: Cardiovascular;  Laterality: N/A;   CHOLECYSTECTOMY     HERNIA REPAIR     JOINT REPLACEMENT     kidney stones     LAMINECTOMY WITH POSTERIOR LATERAL ARTHRODESIS LEVEL 1 Right 11/25/2019   Procedure: Laminectomy and Foraminotomy  Lumbar Four-Lumbar Five - right, with instrumented fusion;  Surgeon: Eustace Moore, MD;  Location: [place];  Service: Neurosurgery;  Laterality: Right;  Laminectomy and Foraminotomy  Lumbar Four-Lumbar Five - right, with instrumented fusion   PROSTATE SURGERY     TEE WITHOUT CARDIOVERSION N/A 06/01/2022   Procedure: TRANSESOPHAGEAL ECHOCARDIOGRAM (TEE);  Surgeon: Donato Heinz, MD;  [facility];  Service: Cardiovascular;  Laterality: N/A;    Current Outpatient Medications  Medication Sig Dispense Refill   ADVIL 200 MG CAPS Take 200-400 mg by mouth every 6 (six) hours as needed (for headaches or mild pain).     apixaban (ELIQUIS) 5 MG TABS tablet Take 1 tablet (5 mg total) by mouth 2 (two) times daily.  180 tablet 1   gabapentin (NEURONTIN) 100 MG capsule Take 200 mg by mouth at bedtime as needed. As directed     No current facility-administered medications for this visit.    Allergies:   Ciprofloxacin, Tramadol, Duloxetine, Hydrocodone-acetaminophen, Nisoldipine, Benadryl [diphenhydramine], Methocarbamol, Ondansetron hcl, and Sulfa antibiotics   Social History:  The patient   reports that he quit smoking about 25 years ago. His smoking use included cigarettes. He has never used smokeless tobacco. He reports that he does not drink alcohol and does not use drugs.   Family History:  The patient's family history includes Hypertension in his father.***  ROS:  Please see the history of present illness.    All other systems are reviewed and otherwise negative.   PHYSICAL EXAM:  VS:  There were no vitals taken for this visit. BMI: There is no height or weight on file to calculate BMI. Well nourished, well developed, in no acute distress HEENT: normocephalic, atraumatic Neck: no JVD, carotid bruits or masses Cardiac:  *** RRR; no significant murmurs, no rubs, or gallops Lungs:  *** CTA b/l, no wheezing, rhonchi or rales Abd: soft, nontender MS: no deformity or *** atrophy Ext: *** no edema Skin: warm and dry, no rash Neuro:  No gross deficits appreciated Psych: euthymic mood, full affect  *** PPM/ICD site is stable, no tethering or discomfort   EKG:  Done today and reviewed by myself shows  ***  Device interrogation done today and reviewed by myself:  ***   06/01/22: TEE 1. Left ventricular ejection fraction, by estimation, is 50 to 55%. The  left ventricle has low normal function.   2. Right ventricular systolic function is normal. The right ventricular  size is normal.   3. No left atrial/left atrial appendage thrombus was detected.   4. Right atrial size was mildly dilated.   5. The mitral valve is normal in structure. Trivial mitral valve  regurgitation.   6. Tricuspid valve regurgitation is mild to moderate.   7. The aortic valve is tricuspid. Aortic valve regurgitation is not  visualized.   Conclusion(s)/Recommendation(s): No LA/LAA thrombus identified. Successful  cardioversion performed.    10/29/21; EPS/ablation CONCLUSIONS: 1. Atrial fibrillation upon presentation.   2. Successful electrical isolation and anatomical encircling of all  four pulmonary veins with radiofrequency current.  A WACA approach was used 3. Additional left atrial ablation was performed with a standard box lesion created along the posterior wall of the left atrium 4. No early apparent complications.   Recent Labs: 01/12/2022: NT-Pro BNP 261 05/30/2022: ALT 19; B Natriuretic Peptide 256.1; Magnesium 2.1 05/31/2022: BUN 13; Creatinine, Ser 1.26; Hemoglobin 9.6; Platelets 384; Potassium 4.3; Sodium 139; TSH 0.946  No results found for requested labs within last 365 days.   CrCl cannot be calculated (Patient's most recent lab result is older than the maximum 21 days allowed.).   Wt Readings from Last 3 Encounters:  09/12/22 185 lb 1.9 oz (84 kg)  06/15/22 177 lb 6.4 oz (80.5 kg)  05/31/22 174 lb 14.4 oz (79.3 kg)     Other studies reviewed: Additional studies/records reviewed today include: summarized above  ASSESSMENT AND PLAN:  ***  Disposition: F/u with ***  Current medicines are reviewed at length with the patient today.  The patient did not have any concerns regarding medicines.  Venetia Night, PA-C 10/12/2022 4:23 PM     [place]  [id number] [phone] (office)  [phone] (fax)

## 2022-10-13 ENCOUNTER — Encounter: Payer: Self-pay | Admitting: Physician Assistant

## 2022-10-13 ENCOUNTER — Ambulatory Visit: Payer: PPO | Attending: Physician Assistant | Admitting: Physician Assistant

## 2022-10-13 VITALS — BP 110/66 | HR 83 | Ht 75.0 in | Wt 186.0 lb

## 2022-10-13 DIAGNOSIS — Z950 Presence of cardiac pacemaker: Secondary | ICD-10-CM | POA: Diagnosis not present

## 2022-10-13 DIAGNOSIS — I4819 Other persistent atrial fibrillation: Secondary | ICD-10-CM | POA: Diagnosis not present

## 2022-10-13 DIAGNOSIS — I493 Ventricular premature depolarization: Secondary | ICD-10-CM | POA: Diagnosis not present

## 2022-10-13 DIAGNOSIS — I471 Supraventricular tachycardia, unspecified: Secondary | ICD-10-CM

## 2022-10-13 LAB — CUP PACEART INCLINIC DEVICE CHECK
Battery Remaining Longevity: 104 mo
Battery Voltage: 3.01 V
Brady Statistic RA Percent Paced: 25 %
Brady Statistic RV Percent Paced: 1 %
Date Time Interrogation Session: 20231102131529
Implantable Lead Connection Status: 753985
Implantable Lead Connection Status: 753985
Implantable Lead Implant Date: 20211011
Implantable Lead Implant Date: 20211011
Implantable Lead Location: 753859
Implantable Lead Location: 753860
Implantable Pulse Generator Implant Date: 20211011
Lead Channel Impedance Value: 375 Ohm
Lead Channel Impedance Value: 462.5 Ohm
Lead Channel Pacing Threshold Amplitude: 0.75 V
Lead Channel Pacing Threshold Amplitude: 0.75 V
Lead Channel Pacing Threshold Amplitude: 0.75 V
Lead Channel Pacing Threshold Amplitude: 0.75 V
Lead Channel Pacing Threshold Pulse Width: 0.5 ms
Lead Channel Pacing Threshold Pulse Width: 0.5 ms
Lead Channel Pacing Threshold Pulse Width: 0.5 ms
Lead Channel Pacing Threshold Pulse Width: 0.5 ms
Lead Channel Sensing Intrinsic Amplitude: 2.6 mV
Lead Channel Sensing Intrinsic Amplitude: 9.4 mV
Lead Channel Setting Pacing Amplitude: 2 V
Lead Channel Setting Pacing Amplitude: 2 V
Lead Channel Setting Pacing Pulse Width: 0.5 ms
Lead Channel Setting Sensing Sensitivity: 2 mV
Pulse Gen Model: 2272
Pulse Gen Serial Number: 3869163

## 2022-10-13 MED ORDER — METOPROLOL SUCCINATE ER 25 MG PO TB24
25.0000 mg | ORAL_TABLET | Freq: Every day | ORAL | 3 refills | Status: DC
Start: 2022-10-13 — End: 2022-12-15

## 2022-10-13 MED ORDER — METOPROLOL SUCCINATE ER 25 MG PO TB24: 25.0000 mg | ORAL_TABLET | Freq: Every day | ORAL | 3 refills | Status: DC

## 2022-10-13 NOTE — Patient Instructions (Addendum)
Medication Instructions:   START TAKING: TOPROL XL -25 MG  ONCE A DAY AT BEDTIME   *If you need a refill on your cardiac medications before your next appointment, please call your pharmacy*   Lab Work: BMET AND  CBC TODAY   If you have labs (blood work) drawn today and your tests are completely normal, you will receive your results only by: [place] (if you have MyChart) OR A paper copy in the mail If you have any lab test that is abnormal or we need to change your treatment, we will call you to review the results.   Testing/Procedures: NONE ORDERED  TODAY   Follow-Up: At [facility], you and your health needs are our priority.  As part of our continuing mission to provide you with exceptional heart care, we have created designated Provider Care Teams.  These Care Teams include your primary Cardiologist (physician) and Advanced Practice Providers (APPs -  Physician Assistants and Nurse Practitioners) who all work together to provide you with the care you need, when you need it.  We recommend signing up for the patient portal called "MyChart".  Sign up information is provided on this After Visit Summary.  MyChart is used to connect with patients for Virtual Visits (Telemedicine).  Patients are able to view lab/test results, encounter notes, upcoming appointments, etc.  Non-urgent messages can be sent to your provider as well.   To learn more about what you can do with MyChart, go to [email or web address].    Your next appointment:   2 month(s)  The format for your next appointment:   In Person  Provider:   Tommye Standard, PA-C    Other Instructions    Important Information About Sugar

## 2022-10-14 ENCOUNTER — Other Ambulatory Visit: Payer: Self-pay | Admitting: *Deleted

## 2022-10-14 DIAGNOSIS — I493 Ventricular premature depolarization: Secondary | ICD-10-CM

## 2022-10-14 LAB — BASIC METABOLIC PANEL
BUN/Creatinine Ratio: 13 (ref 10–24)
BUN: 16 mg/dL (ref 8–27)
CO2: 25 mmol/L (ref 20–29)
Calcium: 9.8 mg/dL (ref 8.6–10.2)
Chloride: 107 mmol/L — ABNORMAL HIGH (ref 96–106)
Creatinine, Ser: 1.26 mg/dL (ref 0.76–1.27)
Glucose: 94 mg/dL (ref 70–99)
Potassium: 5.4 mmol/L — ABNORMAL HIGH (ref 3.5–5.2)
Sodium: 143 mmol/L (ref 134–144)
eGFR: 56 mL/min/{1.73_m2} — ABNORMAL LOW (ref >59)

## 2022-10-14 LAB — CBC
Hematocrit: 31.2 % — ABNORMAL LOW (ref 37.5–51.0)
Hemoglobin: 10.5 g/dL — ABNORMAL LOW (ref 13.0–17.7)
MCH: 33.4 pg — ABNORMAL HIGH (ref 26.6–33.0)
MCHC: 33.7 g/dL (ref 31.5–35.7)
MCV: 99 fL — ABNORMAL HIGH (ref 79–97)
Platelets: 283 10*3/uL (ref 150–450)
RBC: 3.14 x10E6/uL — ABNORMAL LOW (ref 4.14–5.80)
RDW: 22.2 % — ABNORMAL HIGH (ref 11.6–15.4)
WBC: 4.8 10*3/uL (ref 3.4–10.8)

## 2022-10-28 ENCOUNTER — Ambulatory Visit: Payer: PPO

## 2022-10-29 LAB — BASIC METABOLIC PANEL WITH GFR
BUN/Creatinine Ratio: 13 (ref 10–24)
BUN: 16 mg/dL (ref 8–27)
CO2: 22 mmol/L (ref 20–29)
Calcium: 9.1 mg/dL (ref 8.6–10.2)
Chloride: 107 mmol/L — ABNORMAL HIGH (ref 96–106)
Creatinine, Ser: 1.28 mg/dL — ABNORMAL HIGH (ref 0.76–1.27)
Glucose: 118 mg/dL — ABNORMAL HIGH (ref 70–99)
Potassium: 4.7 mmol/L (ref 3.5–5.2)
Sodium: 143 mmol/L (ref 134–144)
eGFR: 55 mL/min/1.73 — ABNORMAL LOW

## 2022-10-29 LAB — MAGNESIUM: Magnesium: 2.1 mg/dL (ref 1.6–2.3)

## 2022-11-11 ENCOUNTER — Other Ambulatory Visit: Payer: Self-pay | Admitting: Cardiology

## 2022-11-11 DIAGNOSIS — I480 Paroxysmal atrial fibrillation: Secondary | ICD-10-CM

## 2022-11-11 NOTE — Telephone Encounter (Signed)
Prescription refill request for Eliquis received. Indication:afib Last office visit:11/23 Scr:1.2 Age: 86 Weight:84.4 kg  Prescription refilled

## 2022-12-08 ENCOUNTER — Ambulatory Visit (INDEPENDENT_AMBULATORY_CARE_PROVIDER_SITE_OTHER): Payer: PPO

## 2022-12-08 DIAGNOSIS — I480 Paroxysmal atrial fibrillation: Secondary | ICD-10-CM

## 2022-12-08 LAB — CUP PACEART REMOTE DEVICE CHECK
Battery Remaining Longevity: 92 mo
Battery Remaining Percentage: 81 %
Battery Voltage: 3.01 V
Brady Statistic AP VP Percent: 1 %
Brady Statistic AP VS Percent: 36 %
Brady Statistic AS VP Percent: 1 %
Brady Statistic AS VS Percent: 57 %
Brady Statistic RA Percent Paced: 27 %
Brady Statistic RV Percent Paced: 1.2 %
Date Time Interrogation Session: 20231228054431
Implantable Lead Connection Status: 753985
Implantable Lead Connection Status: 753985
Implantable Lead Implant Date: 20211011
Implantable Lead Implant Date: 20211011
Implantable Lead Location: 753859
Implantable Lead Location: 753860
Implantable Pulse Generator Implant Date: 20211011
Lead Channel Impedance Value: 350 Ohm
Lead Channel Impedance Value: 450 Ohm
Lead Channel Pacing Threshold Amplitude: 0.75 V
Lead Channel Pacing Threshold Amplitude: 0.75 V
Lead Channel Pacing Threshold Pulse Width: 0.5 ms
Lead Channel Pacing Threshold Pulse Width: 0.5 ms
Lead Channel Sensing Intrinsic Amplitude: 2.1 mV
Lead Channel Sensing Intrinsic Amplitude: 7.8 mV
Lead Channel Setting Pacing Amplitude: 2 V
Lead Channel Setting Pacing Amplitude: 2 V
Lead Channel Setting Pacing Pulse Width: 0.5 ms
Lead Channel Setting Sensing Sensitivity: 2 mV
Pulse Gen Model: 2272
Pulse Gen Serial Number: 3869163

## 2022-12-11 NOTE — Progress Notes (Unsigned)
Cardiology Office Note Date:  12/11/2022  Patient ID:  Todd, Salazar [birth year], MRN [id number] PCP:  [name], DO  Cardiologist:  Dr. Minna Merritts Electrophysiologist: Dr. Curt Bears     Chief Complaint:  *** planned f/u  History of Present Illness: Todd Salazar is a 86 y.o. male with history of of persistent Afib, Atach, bronchiectasis/restrictive lung disease, symptomatic bradycardia with PPM, orthostatic hypotension, prostate cancer, CKD (III).  Hospitalized a couple times last fall with AFib RVR, had been previously mentioned by Dr. Minna Merritts that he may need AV node ablation, but the pt hoping to avoid that. Having previously failed sotalol, flecainide, and propafenone, and apparently intolerant of nodal blocking agents He was started on Tikosyn Sept 2022though quickly back in the hospital with RVR though did have spontaneous CV to SR and planned to continue Tikosyn and follow up outpt.  PVI ablation 10/29/21 Did have an ER visit with CP, CT chest was OK, felt to be pericarditis 2/2 ablation and given a regime of colchicine.  Admitted 05/30/22 with AFlutter/RVR (s/p back surgery on 6/5) TEE/DCCV 6/21 Discharged 06/01/22  Saw the Af clinic July, doing well  Saw Dr. Curt Bears 09/12/22, c/o a new unusual fatigue, EKG was SR with PVCs. AFib burden <1% Planned for a monitor to assess burden  Dominant rhythm is sinus rhythm with intermittent atrial and ventricular pacing 110 SVT episodes, longest and fastest 40 minutes at 125 bpm 11% PVC burden No triggered episodes recorded  Pt reached out still not feeling well at all, and advised to come in for discussion on next steps for his PVC management  I saw him 10/13/22 About June or so, just after his last DCCV he has felt unusually fatigued, tired. He has a constant sense of feeling weak, tired, sometimes perhaps off balance. Not near syncopal, no syncope, not dizzy. No CP Perhaps some palpitations on occasion Gets winded with  longer walks, maybe not new for him He tried to walk every day for exercise No bleeding or signs of bleeding. He can not recall specifics on how prior meds failed or were not tolerated, "has been tried on so many" Discussed with Dr. Curt Bears, could consider retrying flecainide, try mexiletine perhaps for his PVCs Ultimately decided to try low dose Toprol in effort to reduce his PVC burden  He established with new PMD team 11/30/22, started on Requip for RLS, started pepcid and protonix  *** multifactorial fatigue?? *** tol toprol? *** PVC burden? Symptoms *** AFib burden   Device information Abbott dual chamber PPM implanted 09/21/2020  AAD Hx failed sotalol, flecainide, and propafenone, and apparently intolerant of nodal blocking agents Tikosyn Sept 2022 >>failed to maintain SR stopped Sept 2022 PVI ablation 10/29/2021 Amiodarone started Sept 2022 >> stopped post PVI ablation  Past Medical History:  Diagnosis Date   Arthritis    Atrial fibrillation ([place])    Cancer ([place])    prostate   Dysrhythmia    afib   History of kidney stones     Past Surgical History:  Procedure Laterality Date   ATRIAL FIBRILLATION ABLATION N/A 10/29/2021   Procedure: ATRIAL FIBRILLATION ABLATION;  Surgeon: Constance Haw, MD;  Location: [place] CV LAB;  Service: Cardiovascular;  Laterality: N/A;   Bone spur     CARDIOVERSION N/A 06/01/2022   Procedure: CARDIOVERSION;  Surgeon: Donato Heinz, MD;  Location: [place];  Service: Cardiovascular;  Laterality: N/A;   [place]  JOINT REPLACEMENT     kidney stones     LAMINECTOMY WITH POSTERIOR LATERAL ARTHRODESIS LEVEL 1 Right 11/25/2019   Procedure: Laminectomy and Foraminotomy  Lumbar Four-Lumbar Five - right, with instrumented fusion;  Surgeon: Eustace Moore, MD;  Location: [place];  Service: Neurosurgery;  Laterality: Right;  Laminectomy and Foraminotomy  Lumbar Four-Lumbar Five - right, with  instrumented fusion   PROSTATE SURGERY     TEE WITHOUT CARDIOVERSION N/A 06/01/2022   Procedure: TRANSESOPHAGEAL ECHOCARDIOGRAM (TEE);  Surgeon: Donato Heinz, MD;  Location[facility] ENDOSCOPY;  Service: Cardiovascular;  Laterality: N/A;    Current Outpatient Medications  Medication Sig Dispense Refill   ADVIL 200 MG CAPS Take 200-400 mg by mouth every 6 (six) hours as needed (for headaches or mild pain).     ELIQUIS 5 MG TABS tablet TAKE 1 TABLET BY MOUTH 2 TIMES A DAY 180 tablet 1   gabapentin (NEURONTIN) 100 MG capsule Take 200 mg by mouth at bedtime as needed. As directed     metoprolol succinate (TOPROL XL) 25 MG 24 hr tablet Take 1 tablet (25 mg total) by mouth at bedtime. 90 tablet 3   No current facility-administered medications for this visit.    Allergies:   Ciprofloxacin, Tramadol, Duloxetine, Hydrocodone-acetaminophen, Nisoldipine, Benadryl [diphenhydramine], Methocarbamol, Ondansetron hcl, and Sulfa antibiotics   Social History:  The patient  reports that he quit smoking about 26 years ago. His smoking use included cigarettes. He has never used smokeless tobacco. He reports that he does not drink alcohol and does not use drugs.   Family History:  The patient's family history includes Hypertension in his father.  ROS:  Please see the history of present illness.    All other systems are reviewed and otherwise negative.   PHYSICAL EXAM:  VS:  There were no vitals taken for this visit. BMI: There is no height or weight on file to calculate BMI. Well nourished, well developed, in no acute distress HEENT: normocephalic, atraumatic Neck: no JVD, carotid bruits or masses Cardiac:  *** RRR; some extrasystoles noted, no significant murmurs, no rubs, or gallops Lungs:  ***CTA b/l, no wheezing, rhonchi or rales Abd: soft, nontender MS: no deformity, age appropriate atrophy Ext: *** no edema Skin: warm and dry, no rash Neuro:  No gross deficits appreciated Psych: euthymic  mood, full affect  *** PPM site is stable, no tethering or discomfort   EKG:  not done today  Device interrogation done today and reviewed by myself:  ***   06/01/22: TEE 1. Left ventricular ejection fraction, by estimation, is 50 to 55%. The  left ventricle has low normal function.   2. Right ventricular systolic function is normal. The right ventricular  size is normal.   3. No left atrial/left atrial appendage thrombus was detected.   4. Right atrial size was mildly dilated.   5. The mitral valve is normal in structure. Trivial mitral valve  regurgitation.   6. Tricuspid valve regurgitation is mild to moderate.   7. The aortic valve is tricuspid. Aortic valve regurgitation is not  visualized.   Conclusion(s)/Recommendation(s): No LA/LAA thrombus identified. Successful  cardioversion performed.    10/29/21; EPS/ablation CONCLUSIONS: 1. Atrial fibrillation upon presentation.   2. Successful electrical isolation and anatomical encircling of all four pulmonary veins with radiofrequency current.  A WACA approach was used 3. Additional left atrial ablation was performed with a standard box lesion created along the posterior wall of the left atrium 4. No early apparent complications.  Recent Labs: 01/12/2022: NT-Pro BNP 261 05/30/2022: ALT 19; B Natriuretic Peptide 256.1 05/31/2022: TSH 0.946 10/13/2022: Hemoglobin 10.5; Platelets 283 10/28/2022: BUN 16; Creatinine, Ser 1.28; Magnesium 2.1; Potassium 4.7; Sodium 143  No results found for requested labs within last 365 days.   CrCl cannot be calculated (Patient's most recent lab result is older than the maximum 21 days allowed.).   Wt Readings from Last 3 Encounters:  10/13/22 186 lb (84.4 kg)  09/12/22 185 lb 1.9 oz (84 kg)  06/15/22 177 lb 6.4 oz (80.5 kg)     Other studies reviewed: Additional studies/records reviewed today include: summarized above  ASSESSMENT AND PLAN:  Persistent AFib CHA2DS2Vasc is 2, on  eliquis, *** appropriately dosed *** % burden  PPM *** Intact function *** No programming changes made  PVCs 11% burden ***  4. Fatigue ***     Disposition: ***   Current medicines are reviewed at length with the patient today.  The patient did not have any concerns regarding medicines.  Venetia Night, PA-C 12/11/2022 1:03 PM     [place] [address] [phone] (office)  [phone] (fax)

## 2022-12-15 ENCOUNTER — Encounter: Payer: Self-pay | Admitting: Physician Assistant

## 2022-12-15 ENCOUNTER — Ambulatory Visit: Payer: PPO | Attending: Physician Assistant | Admitting: Physician Assistant

## 2022-12-15 VITALS — BP 114/60 | HR 68 | Ht 75.0 in | Wt 193.0 lb

## 2022-12-15 DIAGNOSIS — I480 Paroxysmal atrial fibrillation: Secondary | ICD-10-CM | POA: Diagnosis not present

## 2022-12-15 DIAGNOSIS — I493 Ventricular premature depolarization: Secondary | ICD-10-CM | POA: Diagnosis not present

## 2022-12-15 DIAGNOSIS — I4892 Unspecified atrial flutter: Secondary | ICD-10-CM

## 2022-12-15 DIAGNOSIS — Z950 Presence of cardiac pacemaker: Secondary | ICD-10-CM | POA: Diagnosis not present

## 2022-12-15 LAB — CUP PACEART INCLINIC DEVICE CHECK
Battery Remaining Longevity: 103 mo
Battery Voltage: 3.01 V
Brady Statistic RA Percent Paced: 27 %
Brady Statistic RV Percent Paced: 1.1 %
Date Time Interrogation Session: 20240104084142
Implantable Lead Connection Status: 753985
Implantable Lead Connection Status: 753985
Implantable Lead Implant Date: 20211011
Implantable Lead Implant Date: 20211011
Implantable Lead Location: 753859
Implantable Lead Location: 753860
Implantable Pulse Generator Implant Date: 20211011
Lead Channel Impedance Value: 375 Ohm
Lead Channel Impedance Value: 475 Ohm
Lead Channel Pacing Threshold Amplitude: 0.75 V
Lead Channel Pacing Threshold Amplitude: 0.75 V
Lead Channel Pacing Threshold Amplitude: 1 V
Lead Channel Pacing Threshold Amplitude: 1 V
Lead Channel Pacing Threshold Pulse Width: 0.5 ms
Lead Channel Pacing Threshold Pulse Width: 0.5 ms
Lead Channel Pacing Threshold Pulse Width: 0.5 ms
Lead Channel Pacing Threshold Pulse Width: 0.5 ms
Lead Channel Sensing Intrinsic Amplitude: 2.1 mV
Lead Channel Sensing Intrinsic Amplitude: 7.9 mV
Lead Channel Setting Pacing Amplitude: 2 V
Lead Channel Setting Pacing Amplitude: 2 V
Lead Channel Setting Pacing Pulse Width: 0.5 ms
Lead Channel Setting Sensing Sensitivity: 2 mV
Pulse Gen Model: 2272
Pulse Gen Serial Number: 3869163

## 2022-12-15 NOTE — Patient Instructions (Signed)
Medication Instructions:   Your physician recommends that you continue on your current medications as directed. Please refer to the Current Medication list given to you today.   *If you need a refill on your cardiac medications before your next appointment, please call your pharmacy*   Lab Work: [place]   If you have labs (blood work) drawn today and your tests are completely normal, you will receive your results only by: [place] (if you have MyChart) OR A paper copy in the mail If you have any lab test that is abnormal or we need to change your treatment, we will call you to review the results.   Testing/Procedures:  NONE ORDERED  TODAY     Follow-Up: At [facility] [place] [facility], you and your health needs are our priority.  As part of our continuing mission to provide you with exceptional heart care, we have created designated Provider Care Teams.  These Care Teams include your primary Cardiologist (physician) and Advanced Practice Providers (APPs -  Physician Assistants and Nurse Practitioners) who all work together to provide you with the care you need, when you need it.  We recommend signing up for the patient portal called "MyChart".  Sign up information is provided on this After Visit Summary.  MyChart is used to connect with patients for Virtual Visits (Telemedicine).  Patients are able to view lab/test results, encounter notes, upcoming appointments, etc.  Non-urgent messages can be sent to your provider as well.   To learn more about what you can do with MyChart, go to [email or web address].    Your next appointment:   3 -4 month(s)  The format for your next appointment:   In Person  Provider:   You may see Will Meredith Leeds, MD or one of the following Advanced Practice Providers on your designated Care Team:   Tommye Standard, [place] Legrand Como "Jonni Sanger" Chalmers Cater, [place]  Other Instructions   Important Information About Sugar

## 2022-12-27 NOTE — Progress Notes (Signed)
Remote pacemaker transmission.   

## 2023-03-09 ENCOUNTER — Ambulatory Visit (INDEPENDENT_AMBULATORY_CARE_PROVIDER_SITE_OTHER): Payer: PPO

## 2023-03-09 DIAGNOSIS — I4819 Other persistent atrial fibrillation: Secondary | ICD-10-CM | POA: Diagnosis not present

## 2023-03-09 LAB — CUP PACEART REMOTE DEVICE CHECK
Battery Remaining Longevity: 91 mo
Battery Remaining Percentage: 79 %
Battery Voltage: 3.01 V
Brady Statistic AP VP Percent: 1 %
Brady Statistic AP VS Percent: 30 %
Brady Statistic AS VP Percent: 1 %
Brady Statistic AS VS Percent: 68 %
Brady Statistic RA Percent Paced: 27 %
Brady Statistic RV Percent Paced: 1 %
Date Time Interrogation Session: 20240328072524
Implantable Lead Connection Status: 753985
Implantable Lead Connection Status: 753985
Implantable Lead Implant Date: 20211011
Implantable Lead Implant Date: 20211011
Implantable Lead Location: 753859
Implantable Lead Location: 753860
Implantable Pulse Generator Implant Date: 20211011
Lead Channel Impedance Value: 350 Ohm
Lead Channel Impedance Value: 450 Ohm
Lead Channel Pacing Threshold Amplitude: 0.75 V
Lead Channel Pacing Threshold Amplitude: 1 V
Lead Channel Pacing Threshold Pulse Width: 0.5 ms
Lead Channel Pacing Threshold Pulse Width: 0.5 ms
Lead Channel Sensing Intrinsic Amplitude: 10 mV
Lead Channel Sensing Intrinsic Amplitude: 2.2 mV
Lead Channel Setting Pacing Amplitude: 2 V
Lead Channel Setting Pacing Amplitude: 2 V
Lead Channel Setting Pacing Pulse Width: 0.5 ms
Lead Channel Setting Sensing Sensitivity: 2 mV
Pulse Gen Model: 2272
Pulse Gen Serial Number: 3869163

## 2023-03-20 ENCOUNTER — Encounter: Payer: Self-pay | Admitting: *Deleted

## 2023-03-20 ENCOUNTER — Ambulatory Visit: Payer: PPO | Attending: Cardiology | Admitting: Cardiology

## 2023-03-20 ENCOUNTER — Encounter: Payer: Self-pay | Admitting: Cardiology

## 2023-03-20 VITALS — BP 138/74 | HR 70 | Ht 75.0 in | Wt 194.0 lb

## 2023-03-20 DIAGNOSIS — I493 Ventricular premature depolarization: Secondary | ICD-10-CM

## 2023-03-20 DIAGNOSIS — Z01812 Encounter for preprocedural laboratory examination: Secondary | ICD-10-CM

## 2023-03-20 DIAGNOSIS — D6869 Other thrombophilia: Secondary | ICD-10-CM | POA: Diagnosis not present

## 2023-03-20 DIAGNOSIS — I4819 Other persistent atrial fibrillation: Secondary | ICD-10-CM

## 2023-03-20 MED ORDER — AMIODARONE HCL 200 MG PO TABS: ORAL_TABLET | ORAL | 1 refills | Status: DC

## 2023-03-20 NOTE — Patient Instructions (Addendum)
Medication Instructions:  Your physician has recommended you make the following change in your medication:  START Amiodarone  - take 1 tablet (200 mg total) TWICE a day for one month, then  - take 1 tablet (200 mg total) ONCE a day thereafter  *If you need a refill on your cardiac medications before your next appointment, please call your pharmacy*   Lab Work: Pre procedure labs -- see procedure instruction letter:  BMP & CBC  If you have labs (blood work) drawn today and your tests are completely normal, you will receive your results only by: MyChart Message (if you have MyChart) OR A paper copy in the mail If you have any lab test that is abnormal or we need to change your treatment, we will call you to review the results.   Testing/Procedures: Your physician has requested that you have cardiac CT within 7 days PRIOR to your ablation. Cardiac computed tomography (CT) is a painless test that uses an x-ray machine to take clear, detailed pictures of your heart.  Please follow instruction below located under "other instructions". You will get a call from our office to schedule the date for this test.  Your physician has recommended that you have an ablation. Catheter ablation is a medical procedure used to treat some cardiac arrhythmias (irregular heartbeats). During catheter ablation, a long, thin, flexible tube is put into a blood vessel in your groin (upper thigh), or neck. This tube is called an ablation catheter. It is then guided to your heart through the blood vessel. Radio frequency waves destroy small areas of heart tissue where abnormal heartbeats may cause an arrhythmia to start. Please follow instruction letter given to you today.   Follow-Up: At [facility], you and your health needs are our priority.  As part of our continuing mission to provide you with exceptional heart care, we have created designated Provider Care Teams.  These Care Teams include your primary  Cardiologist (physician) and Advanced Practice Providers (APPs -  Physician Assistants and Nurse Practitioners) who all work together to provide you with the care you need, when you need it.  Your next appointment:   1 month(s) after your ablation  The format for your next appointment:   In Person  Provider:   AFib clinic   Thank you for choosing [facility]!!   Dory Horn, RN [phone]    Other Instructions   Amiodarone Tablets What is this medication? AMIODARONE (a MEE oh da rone) prevents and treats a fast or irregular heartbeat (arrhythmia). It works by slowing down overactive electric signals in the heart, which stabilizes your heart rhythm. It belongs to a group of medications called antiarrhythmics. This medicine may be used for other purposes; ask your health care provider or pharmacist if you have questions. COMMON BRAND NAME(S): Cordarone, Pacerone What should I tell my care team before I take this medication? They need to know if you have any of these conditions: Liver disease Lung disease Other heart problems Thyroid disease An unusual or allergic reaction to amiodarone, iodine, other medications, foods, dyes, or preservatives Pregnant or trying to get pregnant Breast-feeding How should I use this medication? Take this medication by mouth with water. Take it as directed on the prescription label at the same time every day. You can take it with or without food. You should always take it the same way. Keep taking it unless your care team tells you to stop. A special MedGuide will be given to you by  the pharmacist with each prescription and refill. Be sure to read this information carefully each time. Talk to your care team about the use of this medication in children. Special care may be needed. Overdosage: If you think you have taken too much of this medicine contact a poison control center or emergency room at once. NOTE: This medicine is only for you.  Do not share this medicine with others. What if I miss a dose? If you miss a dose, take it as soon as you can. If it is almost time for your next dose, take only that dose. Do not take double or extra doses. What may interact with this medication? Do not take this medication with any of the following: Abarelix Apomorphine Arsenic trioxide Certain antibiotics, such as erythromycin, gemifloxacin, levofloxacin, or pentamidine Certain medications for depression, such as amoxapine or tricyclic antidepressants Certain medications for fungal infections, such as fluconazole, itraconazole, ketoconazole, posaconazole, or voriconazole Certain medications for irregular heartbeat, such as disopyramide, dronedarone, ibutilide, propafenone, or sotalol Certain medications for malaria, such as chloroquine or halofantrine Cisapride Droperidol Haloperidol [place]orn Maprotiline Methadone Phenothiazines, such as chlorpromazine, mesoridazine, or thioridazine Pimozide Ranolazine Red yeast rice Vardenafil This medication may also interact with the following: Antivirals for HIV Certain medications for blood pressure, heart disease, irregular heartbeat Certain medications for cholesterol, such as atorvastatin, cerivastatin, lovastatin, or simvastatin Certain medications for hepatitis C, such as sofosbuvir and ledipasvir; sofosbuvir Certain medications for seizures, such as phenytoin Certain medications for thyroid problems Certain medications that prevent or treat blood clots, such as warfarin Cholestyramine Cimetidine Clopidogrel Cyclosporine Dextromethorphan Diuretics Dofetilide Fentanyl General anesthetics Grapefruit juice Lidocaine Loratadine Methotrexate Other medications that cause heart rhythm changes Procainamide Quinidine Rifabutin, rifampin, or rifapentine St. John's Wort Trazodone Ziprasidone This list may not describe all possible interactions. Give your health care provider a  list of all the medicines, herbs, non-prescription drugs, or dietary supplements you use. Also tell them if you smoke, drink alcohol, or use illegal drugs. Some items may interact with your medicine. What should I watch for while using this medication? Your condition will be monitored closely when you first begin therapy. This medication is often started in a hospital or other monitored health care setting. Once you are on maintenance therapy, visit your care team for regular checks on your progress. Because your condition and use of this medication carry some risk, it is a good idea to carry an identification card, necklace, or bracelet with details of your condition, medications, and care team. This medication may affect your coordination, reaction time, or judgment. Do not drive or operate machinery until you know how this medication affects you. Sit up or stand slowly to reduce the risk of dizzy or fainting spells. Drinking alcohol with this medication can increase the risk of these side effects. This medication can make you more sensitive to the sun. Keep out of the sun. If you cannot avoid being in the sun, wear protective clothing and sunscreen. Do not use sun lamps, tanning beds, or tanning booths. You should have regular eye exams before and during treatment. Call your care team if you have blurred vision, see halos, or your eyes become sensitive to light. Your eyes may get dry. It may be helpful to use a lubricating eye solution or artificial tears solution. If you are going to have surgery or a procedure that requires contrast dyes, tell your care team that you are taking this medication. What side effects may I notice from receiving this medication?  Side effects that you should report to your care team as soon as possible: Allergic reactions--skin rash, itching, hives, swelling of the face, lips, tongue, or throat Bluish-gray skin Change in vision such as blurry vision, seeing halos around  lights, vision loss Heart failure--shortness of breath, swelling of the ankles, feet, or hands, sudden weight gain, unusual weakness or fatigue Heart rhythm changes--fast or irregular heartbeat, dizziness, feeling faint or lightheaded, chest pain, trouble breathing High thyroid levels (hyperthyroidism)--fast or irregular heartbeat, weight loss, excessive sweating or sensitivity to heat, tremors or shaking, anxiety, nervousness, irregular menstrual cycle or spotting Liver injury--right upper belly pain, loss of appetite, nausea, light-colored stool, dark yellow or brown urine, yellowing skin or eyes, unusual weakness or fatigue Low thyroid levels (hypothyroidism)--unusual weakness or fatigue, sensitivity to cold, constipation, hair loss, dry skin, weight gain, feelings of depression Lung injury--shortness of breath or trouble breathing, cough, spitting up blood, chest pain, fever Pain, tingling, or numbness in the hands or feet, muscle weakness, trouble walking, loss of balance or coordination Side effects that usually do not require medical attention (report to your care team if they continue or are bothersome): Nausea Vomiting This list may not describe all possible side effects. Call your doctor for medical advice about side effects. You may report side effects to FDA at 1-800-FDA-1088. Where should I keep my medication? Keep out of the reach of children and pets. Store at room temperature between 20 and 25 degrees C (68 and 77 degrees F). Protect from light. Keep container tightly closed. Throw away any unused medication after the expiration date. NOTE: This sheet is a summary. It may not cover all possible information. If you have questions about this medicine, talk to your doctor, pharmacist, or health care provider.  2023 Elsevier/Gold Standard (2021-01-22 00:00:00)   Cardiac Ablation Cardiac ablation is a procedure to destroy (ablate) some heart tissue that is sending bad signals. These  bad signals cause problems in heart rhythm. The heart has many areas that make these signals. If there are problems in these areas, they can make the heart beat in a way that is not normal. Destroying some tissues can help make the heart rhythm normal. Tell your doctor about: Any allergies you have. All medicines you are taking. These include vitamins, herbs, eye drops, creams, and over-the-counter medicines. Any problems you or family members have had with medicines that make you fall asleep (anesthetics). Any blood disorders you have. Any surgeries you have had. Any medical conditions you have, such as kidney failure. Whether you are pregnant or may be pregnant. What are the risks? This is a safe procedure. But problems may occur, including: Infection. Bruising and bleeding. Bleeding into the chest. Stroke or blood clots. Damage to nearby areas of your body. Allergies to medicines or dyes. The need for a pacemaker if the normal system is damaged. Failure of the procedure to treat the problem. What happens before the procedure? Medicines Ask your doctor about: Changing or stopping your normal medicines. This is important. Taking aspirin and ibuprofen. Do not take these medicines unless your doctor tells you to take them. Taking other medicines, vitamins, herbs, and supplements. General instructions Follow instructions from your doctor about what you cannot eat or drink. Plan to have someone take you home from the hospital or clinic. If you will be going home right after the procedure, plan to have someone with you for 24 hours. Ask your doctor what steps will be taken to prevent infection. What happens during  the procedure?  An IV tube will be put into one of your veins. You will be given a medicine to help you relax. The skin on your neck or groin will be numbed. A cut (incision) will be made in your neck or groin. A needle will be put through your cut and into a large vein. A  tube (catheter) will be put into the needle. The tube will be moved to your heart. Dye may be put through the tube. This helps your doctor see your heart. Small devices (electrodes) on the tube will send out signals. A type of energy will be used to destroy some heart tissue. The tube will be taken out. Pressure will be held on your cut. This helps stop bleeding. A bandage will be put over your cut. The exact procedure may vary among doctors and hospitals. What happens after the procedure? You will be watched until you leave the hospital or clinic. This includes checking your heart rate, breathing rate, oxygen, and blood pressure. Your cut will be watched for bleeding. You will need to lie still for a few hours. Do not drive for 24 hours or as long as your doctor tells you. Summary Cardiac ablation is a procedure to destroy some heart tissue. This is done to treat heart rhythm problems. Tell your doctor about any medical conditions you may have. Tell him or her about all medicines you are taking to treat them. This is a safe procedure. But problems may occur. These include infection, bruising, bleeding, and damage to nearby areas of your body. Follow what your doctor tells you about food and drink. You may also be told to change or stop some of your medicines. After the procedure, do not drive for 24 hours or as long as your doctor tells you. This information is not intended to replace advice given to you by your health care provider. Make sure you discuss any questions you have with your health care provider. Document Revised: 02/18/2022 Document Reviewed: 10/31/2019 Elsevier Patient Education  2023 [employer].

## 2023-03-20 NOTE — Progress Notes (Signed)
Electrophysiology Office Note   Date:  03/20/2023   ID:  Todd Salazar, DOB 11/04/1936, MRN [id number]  PCP:  Patient, No Pcp Per  Cardiologist:   Primary Electrophysiologist:  Joe Tanney [name], MD    Chief Complaint: AF   History of Present Illness: Todd Salazar is a 87 y.o. male who is being seen today for the evaluation of AF at the request of No ref. provider found. Presenting today for electrophysiology evaluation.  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  He presented to the hospital September 2022 in atrial fibrillation and was loaded on dofetilide.  He is post atrial fibrillation ablation 10/29/2021.  Post ablation he presented to the emergency room with pericarditis and was started on colchicine.  Today, denies symptoms of palpitations, chest pain, shortness of breath, orthopnea, PND, lower extremity edema, claudication, dizziness, presyncope, syncope, bleeding, or neurologic sequela. The patient is tolerating medications without difficulties.  He presented to [facility] over the weekend with episodes of atrial fibrillation.  He has a 1.7% burden noted on his pacemaker.  He felt quite poorly.  He was started on medications which converted him to sinus rhythm.  He did not require cardioversion.   Past Medical History:  Diagnosis Date   Arthritis    Atrial fibrillation    Cancer    prostate   Dysrhythmia    afib   History of kidney stones    Past Surgical History:  Procedure Laterality Date   ATRIAL FIBRILLATION ABLATION N/A 10/29/2021   Procedure: ATRIAL FIBRILLATION ABLATION;  Surgeon: Regan Lemming, MD;  Location: [facility] INVASIVE CV LAB;  Service: Cardiovascular;  Laterality: N/A;   Bone spur     CARDIOVERSION N/A 06/01/2022   Procedure: CARDIOVERSION;  Surgeon:  Little Ishikawa, MD;  Location: [facility] ENDOSCOPY;  Service: Cardiovascular;  Laterality: N/A;   CHOLECYSTECTOMY     HERNIA REPAIR     JOINT REPLACEMENT     kidney stones     LAMINECTOMY WITH POSTERIOR LATERAL ARTHRODESIS LEVEL 1 Right 11/25/2019   Procedure: Laminectomy and Foraminotomy  Lumbar Four-Lumbar Five - right, with instrumented fusion;  Surgeon: Tia Alert, MD;  Location: [facility] OR;  Service: Neurosurgery;  Laterality: Right;  Laminectomy and Foraminotomy  Lumbar Four-Lumbar Five - right, with instrumented fusion   PROSTATE SURGERY     TEE WITHOUT CARDIOVERSION N/A 06/01/2022   Procedure: TRANSESOPHAGEAL ECHOCARDIOGRAM (TEE);  Surgeon: Little Ishikawa, MD;  Location: [facility] ENDOSCOPY;  Service: Cardiovascular;  Laterality: N/A;     Current Outpatient Medications  Medication Sig Dispense Refill   ADVIL 200 MG CAPS Take 200-400 mg by mouth every 6 (six) hours as needed (for headaches or mild pain).     amiodarone (PACERONE) 200 MG tablet Take 1 tablet (200 mg total) TWICE a day for one month, then reduce and take 1 tablet ONCE a day thereafter 90 tablet 1   ELIQUIS 5 MG TABS tablet TAKE 1 TABLET BY MOUTH 2 TIMES A DAY 180 tablet 1   gabapentin (NEURONTIN) 100 MG capsule Take 200 mg by mouth at bedtime as needed. As directed     nitroGLYCERIN (NITROSTAT) 0.4 MG SL tablet Place 0.4 mg under the tongue every 5 (five) minutes as needed for chest pain.     omeprazole (PRILOSEC) 40 MG capsule Take 1 capsule by mouth daily.     No  current facility-administered medications for this visit.    Allergies:   Ciprofloxacin, Tramadol, Duloxetine, Hydrocodone-acetaminophen, Nisoldipine, Benadryl [diphenhydramine], Methocarbamol, Ondansetron hcl, and Sulfa antibiotics   Social History:  The patient  reports that he quit smoking about 26 years ago. His smoking use included cigarettes. He has never used smokeless tobacco. He reports that he does not drink alcohol and does not use drugs.    Family History:  The patient's family history includes Hypertension in his father.   ROS:  Please see the history of present illness.   Otherwise, review of systems is positive for none.   All other systems are reviewed and negative.   PHYSICAL EXAM: VS:  BP 138/74   Pulse 70   Ht 6\' 3"  (1.905 m)   Wt 194 lb (88 kg)   SpO2 98%   BMI 24.25 kg/m  , BMI Body mass index is 24.25 kg/m. GEN: Well nourished, well developed, in no acute distress  HEENT: normal  Neck: no JVD, carotid bruits, or masses Cardiac: RRR; no murmurs, rubs, or gallops,no edema  Respiratory:  clear to auscultation bilaterally, normal work of breathing GI: soft, nontender, nondistended, + BS MS: no deformity or atrophy  Skin: warm and dry, device site well healed Neuro:  Strength and sensation are intact Psych: euthymic mood, full affect  EKG:  EKG is not ordered today. Personal review of the ekg ordered 10/13/22 shows sinus rhythm, PVCs  Personal review of the device interrogation today. Results in [facility]    Recent Labs: 05/30/2022: ALT 19; B Natriuretic Peptide 256.1 05/31/2022: TSH 0.946 10/13/2022: Hemoglobin 10.5; Platelets 283 10/28/2022: BUN 16; Creatinine, Ser 1.28; Magnesium 2.1; Potassium 4.7; Sodium 143    Lipid Panel     Component Value Date/Time   CHOL 174 12/11/2011 0500   TRIG 123 12/11/2011 0500   HDL 29 (L) 12/11/2011 0500   CHOLHDL 6.0 12/11/2011 0500   VLDL 25 12/11/2011 0500   LDLCALC 120 (H) 12/11/2011 0500     Wt Readings from Last 3 Encounters:  03/20/23 194 lb (88 kg)  12/15/22 193 lb (87.5 kg)  10/13/22 186 lb (84.4 kg)      Other studies Reviewed: Additional studies/ records that were reviewed today include: TTE 03/17/23  Review of the above records today demonstrates:  There is mild concentric left ventricular hypertrophy with normal wall motion, normal systolic  function and ejection fraction  55-60% .  Device lead in the right ventricle  There is mild tricuspid  regurgitation.  Estimated right ventricular systolic pressure is 49 mmHg.  Moderate pulmonary hypertension.  Probably no significant change in comparison with the prior study noted   Cardiac monitor 10/04/2022 personally reviewed Dominant rhythm is sinus rhythm with intermittent atrial and ventricular pacing 110 SVT episodes, longest and fastest 40 minutes at 125 bpm 11% PVC burden No triggered episodes recorded  ASSESSMENT AND PLAN:  1.  Persistent atrial fibrillation/flutter: CHA2DS2-VASc of 2.  Currently on Eliquis.  Patient.  He had a recent hospitalization due to atrial fibrillation.  He feels quite poorly in atrial fibrillation.  He would prefer a rhythm control strategy.  Deundra Bard start him on amiodarone 200 mg twice daily for a month.  Eulan Heyward plan for repeat ablation.  Risk, benefits, and alternatives to EP study and radiofrequency ablation for afib were also discussed in detail today. These risks include but are not limited to stroke, bleeding, vascular damage, tamponade, perforation, damage to the esophagus, lungs, and other structures, pulmonary vein stenosis, worsening renal function,  and death. The patient understands these risk and wishes to proceed.  We Linn Clavin therefore proceed with catheter ablation at the next available time.  Carto, ICE, anesthesia are requested for the procedure.  Anay Walter also obtain CT PV protocol prior to the procedure to exclude LAA thrombus and further evaluate atrial anatomy.   2.  Restrictive lung disease: Patient without shortness of breath.  Attempting to avoid amiodarone.  3.  Symptomatic bradycardia: Status post [facility] Jude dual-chamber pacemaker.  Device function appropriately.  No changes at this time.  4.  Secondary hypercoagulable state: Currently on Eliquis for atrial fibrillation  5.  PVCs: Monitor with a burden of 11%.   Current medicines are reviewed at length with the patient today.   The patient does not have concerns regarding his medicines.   The following changes were made today: amidarone  Labs/ tests ordered today include:  Orders Placed This Encounter  Procedures   CT CARDIAC MORPH/PULM VEIN W/CM&W/O CA SCORE   Basic metabolic panel   CBC      Disposition:   FU 3 months  Signed, Youcef Klas [name], MD  03/20/2023 11:44 AM     [facility] [address] [place] [address] [phone] (office) [phone] (fax)

## 2023-04-11 ENCOUNTER — Other Ambulatory Visit: Payer: Self-pay | Admitting: Cardiology

## 2023-04-11 DIAGNOSIS — I480 Paroxysmal atrial fibrillation: Secondary | ICD-10-CM

## 2023-04-11 NOTE — Telephone Encounter (Signed)
Pt last saw Dr Elberta Fortis 03/20/23, last labs 04/03/23 Creat 1.36, age 87, weight 88kg, based on specified criteria pt is on appropriate dosage of Eliquis 5mg  BID for afib.  Will refill rx.

## 2023-04-14 ENCOUNTER — Telehealth: Payer: Self-pay | Admitting: *Deleted

## 2023-04-14 MED ORDER — DILTIAZEM HCL ER COATED BEADS 180 MG PO CP24: 180.0000 mg | ORAL_CAPSULE | Freq: Every day | ORAL | 6 refills | Status: DC

## 2023-04-14 NOTE — Telephone Encounter (Signed)
Informed patient of results and verbal understanding expressed. Aware to start Diltiazem 180 mg once daily. Educated to rationale. Advised to call if SE occur after starting. Patient verbalized understanding and agreeable to plan.

## 2023-04-14 NOTE — Telephone Encounter (Signed)
-----   Message from Will [name], MD sent at 03/09/2023  4:16 PM EDT ----- Abnormal device interrogation reviewed.  Lead parameters and battery status stable.  AF with rapid rates. Start diltiazem 180 mg.

## 2023-04-17 NOTE — Progress Notes (Signed)
Remote pacemaker transmission.   

## 2023-05-16 ENCOUNTER — Telehealth: Payer: Self-pay

## 2023-05-16 DIAGNOSIS — I4819 Other persistent atrial fibrillation: Secondary | ICD-10-CM

## 2023-05-16 NOTE — Telephone Encounter (Signed)
LM for pt to call back to schedule lab appointment prior to his CT Scan/Ablation. Has to be within 30 days of procedure date. Pt will need BMET/CBC

## 2023-05-22 NOTE — Telephone Encounter (Signed)
Spoke with pt and he will go to [facility] in [facility] after 6/18 to have his labs drawn for his upcoming Ablation on 7/18 with Dr. Elberta Fortis.

## 2023-05-22 NOTE — Addendum Note (Signed)
Addended by: Cleda Mccreedy on: 05/22/2023 04:00 PM   Modules accepted: Orders

## 2023-06-08 ENCOUNTER — Ambulatory Visit (INDEPENDENT_AMBULATORY_CARE_PROVIDER_SITE_OTHER): Payer: PPO

## 2023-06-08 DIAGNOSIS — I4892 Unspecified atrial flutter: Secondary | ICD-10-CM

## 2023-06-08 LAB — CUP PACEART REMOTE DEVICE CHECK
Battery Remaining Longevity: 89 mo
Battery Remaining Percentage: 77 %
Battery Voltage: 3.01 V
Brady Statistic AP VP Percent: 1 %
Brady Statistic AP VS Percent: 34 %
Brady Statistic AS VP Percent: 1 %
Brady Statistic AS VS Percent: 65 %
Brady Statistic RA Percent Paced: 34 %
Brady Statistic RV Percent Paced: 1 %
Date Time Interrogation Session: 20240627040014
Implantable Lead Connection Status: 753985
Implantable Lead Connection Status: 753985
Implantable Lead Implant Date: 20211011
Implantable Lead Implant Date: 20211011
Implantable Lead Location: 753859
Implantable Lead Location: 753860
Implantable Pulse Generator Implant Date: 20211011
Lead Channel Impedance Value: 360 Ohm
Lead Channel Impedance Value: 460 Ohm
Lead Channel Pacing Threshold Amplitude: 0.75 V
Lead Channel Pacing Threshold Amplitude: 0.75 V
Lead Channel Pacing Threshold Pulse Width: 0.5 ms
Lead Channel Pacing Threshold Pulse Width: 0.5 ms
Lead Channel Sensing Intrinsic Amplitude: 2.5 mV
Lead Channel Sensing Intrinsic Amplitude: 8.1 mV
Lead Channel Setting Pacing Amplitude: 2 V
Lead Channel Setting Pacing Amplitude: 2 V
Lead Channel Setting Pacing Pulse Width: 0.5 ms
Lead Channel Setting Sensing Sensitivity: 2 mV
Pulse Gen Model: 2272
Pulse Gen Serial Number: 3869163

## 2023-06-12 ENCOUNTER — Telehealth: Payer: Self-pay | Admitting: Internal Medicine

## 2023-06-12 ENCOUNTER — Telehealth: Payer: Self-pay

## 2023-06-12 ENCOUNTER — Other Ambulatory Visit (HOSPITAL_COMMUNITY): Payer: Self-pay | Admitting: Cardiology

## 2023-06-12 ENCOUNTER — Other Ambulatory Visit: Payer: Self-pay

## 2023-06-12 DIAGNOSIS — I4819 Other persistent atrial fibrillation: Secondary | ICD-10-CM

## 2023-06-12 NOTE — Telephone Encounter (Signed)
I was contacted by [facility] regarding Mr. Todd Salazar recent BMP.  Patient's potassium was reportedly 5.8.  I will relay information to Dr. Elberta Fortis who ordered the laboratory.  Given potassium is <6, follow up can be done in the morning.

## 2023-06-12 NOTE — Telephone Encounter (Signed)
Spoke with [facility] rep who states orders have not yet been received for pt.  Advised orders were released to [facility] this morning.  Rep requests that RN fax orders to [phone].  Orders faxed as requested at 9:19am.

## 2023-06-13 LAB — BASIC METABOLIC PANEL
BUN/Creatinine Ratio: 15 (ref 10–24)
BUN: 18 mg/dL (ref 8–27)
CO2: 21 mmol/L (ref 20–29)
Calcium: 9.1 mg/dL (ref 8.6–10.2)
Chloride: 106 mmol/L (ref 96–106)
Creatinine, Ser: 1.24 mg/dL (ref 0.76–1.27)
Glucose: 92 mg/dL (ref 70–99)
Potassium: 5.8 mmol/L (ref 3.5–5.2)
Sodium: 138 mmol/L (ref 134–144)
eGFR: 57 mL/min/{1.73_m2} — ABNORMAL LOW (ref >59)

## 2023-06-13 LAB — CBC
Hematocrit: 30.8 % — ABNORMAL LOW (ref 37.5–51.0)
Hemoglobin: 10.4 g/dL — ABNORMAL LOW (ref 13.0–17.7)
MCH: 32.7 pg (ref 26.6–33.0)
MCHC: 33.8 g/dL (ref 31.5–35.7)
MCV: 97 fL (ref 79–97)
Platelets: 255 10*3/uL (ref 150–450)
RBC: 3.18 x10E6/uL — ABNORMAL LOW (ref 4.14–5.80)
RDW: 22.5 % — ABNORMAL HIGH (ref 11.6–15.4)
WBC: 4.4 10*3/uL (ref 3.4–10.8)

## 2023-06-14 ENCOUNTER — Telehealth: Payer: Self-pay | Admitting: *Deleted

## 2023-06-14 DIAGNOSIS — E875 Hyperkalemia: Secondary | ICD-10-CM

## 2023-06-14 NOTE — Telephone Encounter (Signed)
Pt will stop by the office next Thursday, prior to his CT, for recommends follow up blood work. Patient verbalized understanding and agreeable to plan.

## 2023-06-14 NOTE — Telephone Encounter (Signed)
-----   Message from Will [name], MD sent at 06/14/2023  3:26 PM EDT ----- K elevated. Needs recheck.

## 2023-06-20 ENCOUNTER — Encounter (HOSPITAL_COMMUNITY): Payer: Self-pay

## 2023-06-21 ENCOUNTER — Telehealth (HOSPITAL_COMMUNITY): Payer: Self-pay | Admitting: Emergency Medicine

## 2023-06-21 NOTE — Telephone Encounter (Signed)
Reaching out to patient to offer assistance regarding upcoming cardiac imaging study; pt verbalizes understanding of appt date/time, parking situation and where to check in, pre-test NPO status and medications ordered, and verified current allergies; name and call back number provided for further questions should they arise Jamiee Milholland RN Navigator Cardiac Imaging [facility] [phone] office [phone] cell 

## 2023-06-22 ENCOUNTER — Ambulatory Visit: Payer: PPO

## 2023-06-22 ENCOUNTER — Ambulatory Visit (HOSPITAL_COMMUNITY)
Admission: RE | Admit: 2023-06-22 | Discharge: 2023-06-22 | Disposition: A | Discharge status: Home / Self Care | Payer: PPO | Source: Ambulatory Visit | Attending: Cardiology | Admitting: Cardiology

## 2023-06-22 DIAGNOSIS — E875 Hyperkalemia: Secondary | ICD-10-CM | POA: Diagnosis present

## 2023-06-22 DIAGNOSIS — I4819 Other persistent atrial fibrillation: Secondary | ICD-10-CM

## 2023-06-22 LAB — BASIC METABOLIC PANEL
BUN/Creatinine Ratio: 15 (ref 10–24)
BUN: 18 mg/dL (ref 8–27)
CO2: 22 mmol/L (ref 20–29)
Calcium: 9.3 mg/dL (ref 8.6–10.2)
Chloride: 104 mmol/L (ref 96–106)
Creatinine, Ser: 1.23 mg/dL (ref 0.76–1.27)
Glucose: 94 mg/dL (ref 70–99)
Potassium: 5 mmol/L (ref 3.5–5.2)
Sodium: 139 mmol/L (ref 134–144)
eGFR: 58 mL/min/{1.73_m2} — ABNORMAL LOW (ref >59)

## 2023-06-22 MED ORDER — IOHEXOL 350 MG/ML SOLN
95.0000 mL | Freq: Once | INTRAVENOUS | Status: AC | PRN
  Administered 2023-06-22: 95 mL via INTRAVENOUS

## 2023-06-22 NOTE — Progress Notes (Signed)
Remote pacemaker transmission.   

## 2023-06-28 NOTE — Pre-Procedure Instructions (Signed)
Instructed patient on the following items: Arrival time 1100 Nothing to eat or drink after midnight No meds AM of procedure Responsible person to drive you home and stay with you for 24 hrs  Have you missed any doses of anti-coagulant Eliquis- takes twice a day, hasn't missed any doses.  Don't take dose in the morning.

## 2023-06-29 ENCOUNTER — Ambulatory Visit (HOSPITAL_COMMUNITY)
Admission: RE | Admit: 2023-06-29 | Discharge: 2023-06-29 | Disposition: A | Discharge status: Home / Self Care | Payer: PPO | Attending: Cardiology | Admitting: Cardiology

## 2023-06-29 ENCOUNTER — Ambulatory Visit (HOSPITAL_COMMUNITY): Payer: PPO | Admitting: Anesthesiology

## 2023-06-29 ENCOUNTER — Ambulatory Visit (HOSPITAL_BASED_OUTPATIENT_CLINIC_OR_DEPARTMENT_OTHER): Payer: PPO | Admitting: Anesthesiology

## 2023-06-29 ENCOUNTER — Ambulatory Visit (HOSPITAL_COMMUNITY)
Admission: RE | Disposition: A | Discharge status: Home / Self Care | Payer: Self-pay | Source: Home / Self Care | Attending: Cardiology

## 2023-06-29 DIAGNOSIS — I129 Hypertensive chronic kidney disease with stage 1 through stage 4 chronic kidney disease, or unspecified chronic kidney disease: Secondary | ICD-10-CM | POA: Diagnosis not present

## 2023-06-29 DIAGNOSIS — I480 Paroxysmal atrial fibrillation: Secondary | ICD-10-CM | POA: Diagnosis not present

## 2023-06-29 DIAGNOSIS — I484 Atypical atrial flutter: Secondary | ICD-10-CM | POA: Insufficient documentation

## 2023-06-29 DIAGNOSIS — I2510 Atherosclerotic heart disease of native coronary artery without angina pectoris: Secondary | ICD-10-CM | POA: Diagnosis not present

## 2023-06-29 DIAGNOSIS — Z950 Presence of cardiac pacemaker: Secondary | ICD-10-CM | POA: Diagnosis not present

## 2023-06-29 DIAGNOSIS — I4819 Other persistent atrial fibrillation: Secondary | ICD-10-CM | POA: Insufficient documentation

## 2023-06-29 DIAGNOSIS — Z8249 Family history of ischemic heart disease and other diseases of the circulatory system: Secondary | ICD-10-CM | POA: Insufficient documentation

## 2023-06-29 DIAGNOSIS — N1831 Chronic kidney disease, stage 3a: Secondary | ICD-10-CM

## 2023-06-29 DIAGNOSIS — Z87891 Personal history of nicotine dependence: Secondary | ICD-10-CM | POA: Insufficient documentation

## 2023-06-29 HISTORY — PX: ATRIAL FIBRILLATION ABLATION: EP1191

## 2023-06-29 LAB — POCT ACTIVATED CLOTTING TIME
Activated Clotting Time: 220 seconds
Activated Clotting Time: 281 seconds

## 2023-06-29 SURGERY — ATRIAL FIBRILLATION ABLATION
Anesthesia: General

## 2023-06-29 MED ORDER — FENTANYL CITRATE (PF) 250 MCG/5ML IJ SOLN
INTRAMUSCULAR | Status: DC | PRN
  Administered 2023-06-29: 100 ug via INTRAVENOUS

## 2023-06-29 MED ORDER — SUGAMMADEX SODIUM 200 MG/2ML IV SOLN
INTRAVENOUS | Status: DC | PRN
  Administered 2023-06-29: 200 mg via INTRAVENOUS

## 2023-06-29 MED ORDER — HEPARIN (PORCINE) IN NACL 1000-0.9 UT/500ML-% IV SOLN
INTRAVENOUS | Status: DC | PRN
  Administered 2023-06-29 (×4): 500 mL

## 2023-06-29 MED ORDER — ROCURONIUM BROMIDE 10 MG/ML (PF) SYRINGE
PREFILLED_SYRINGE | INTRAVENOUS | Status: DC | PRN
  Administered 2023-06-29: 10 mg via INTRAVENOUS
  Administered 2023-06-29: 40 mg via INTRAVENOUS

## 2023-06-29 MED ORDER — HEPARIN SODIUM (PORCINE) 1000 UNIT/ML IJ SOLN
INTRAMUSCULAR | Status: DC | PRN
  Administered 2023-06-29: 6000 [IU] via INTRAVENOUS
  Administered 2023-06-29: 8000 [IU] via INTRAVENOUS
  Administered 2023-06-29: 14000 [IU] via INTRAVENOUS

## 2023-06-29 MED ORDER — DEXAMETHASONE SODIUM PHOSPHATE 10 MG/ML IJ SOLN
INTRAMUSCULAR | Status: DC | PRN
  Administered 2023-06-29: 10 mg via INTRAVENOUS

## 2023-06-29 MED ORDER — PROPOFOL 10 MG/ML IV BOLUS
INTRAVENOUS | Status: DC | PRN
  Administered 2023-06-29: 100 mg via INTRAVENOUS

## 2023-06-29 MED ORDER — SODIUM CHLORIDE 0.9 % IV SOLN: INTRAVENOUS | Status: DC

## 2023-06-29 MED ORDER — PROTAMINE SULFATE 10 MG/ML IV SOLN
INTRAVENOUS | Status: DC | PRN
  Administered 2023-06-29: 40 mg via INTRAVENOUS

## 2023-06-29 MED ORDER — SODIUM CHLORIDE 0.9% FLUSH: 3.0000 mL | INTRAVENOUS | Status: DC | PRN

## 2023-06-29 MED ORDER — CEFAZOLIN SODIUM-DEXTROSE 2-4 GM/100ML-% IV SOLN
INTRAVENOUS | Status: AC
  Filled 2023-06-29: qty 100

## 2023-06-29 MED ORDER — EPHEDRINE SULFATE-NACL 50-0.9 MG/10ML-% IV SOSY
PREFILLED_SYRINGE | INTRAVENOUS | Status: DC | PRN
  Administered 2023-06-29: 10 mg via INTRAVENOUS

## 2023-06-29 MED ORDER — HEPARIN SODIUM (PORCINE) 1000 UNIT/ML IJ SOLN
INTRAMUSCULAR | Status: DC | PRN
  Administered 2023-06-29: 1000 [IU] via INTRAVENOUS

## 2023-06-29 MED ORDER — CEFAZOLIN SODIUM-DEXTROSE 2-3 GM-%(50ML) IV SOLR
INTRAVENOUS | Status: DC | PRN
  Administered 2023-06-29: 2 g via INTRAVENOUS

## 2023-06-29 MED ORDER — LIDOCAINE 2% (20 MG/ML) 5 ML SYRINGE
INTRAMUSCULAR | Status: DC | PRN
  Administered 2023-06-29: 60 mg via INTRAVENOUS

## 2023-06-29 MED ORDER — ACETAMINOPHEN 325 MG PO TABS: 650.0000 mg | ORAL_TABLET | ORAL | Status: DC | PRN

## 2023-06-29 MED ORDER — HEPARIN SODIUM (PORCINE) 1000 UNIT/ML IJ SOLN
INTRAMUSCULAR | Status: AC
  Filled 2023-06-29: qty 10

## 2023-06-29 MED ORDER — ACETAMINOPHEN 325 MG PO TABS
ORAL_TABLET | ORAL | Status: AC
  Filled 2023-06-29: qty 2

## 2023-06-29 MED ORDER — SODIUM CHLORIDE 0.9 % IV SOLN: 250.0000 mL | INTRAVENOUS | Status: DC | PRN

## 2023-06-29 MED ORDER — PHENYLEPHRINE HCL-NACL 20-0.9 MG/250ML-% IV SOLN
INTRAVENOUS | Status: DC | PRN
  Administered 2023-06-29: 25 ug/min via INTRAVENOUS

## 2023-06-29 SURGICAL SUPPLY — 23 items
BAG SNAP BAND KOVER 36X36 (MISCELLANEOUS) IMPLANT
BLANKET WARM UNDERBOD FULL ACC (MISCELLANEOUS) ×1 IMPLANT
CATH 8FR REPROCESSED SOUNDSTAR (CATHETERS) ×1 IMPLANT
CATH 8FR SOUNDSTAR REPROCESSED (CATHETERS) IMPLANT
CATH ABLAT QDOT MICRO BI TC DF (CATHETERS) IMPLANT
CATH OCTARAY 2.0 F 3-3-3-3-3 (CATHETERS) IMPLANT
CATH PIGTAIL STEERABLE D1 8.7 (WIRE) IMPLANT
CATH S-M CIRCA TEMP PROBE (CATHETERS) IMPLANT
CATH WEB BI DIR CSDF CRV REPRO (CATHETERS) IMPLANT
CATH WEBSTER BI DIR CS D-F CRV (CATHETERS) IMPLANT
CLOSURE MYNX CONTROL 6F/7F (Vascular Products) IMPLANT
CLOSURE PERCLOSE PROSTYLE (VASCULAR PRODUCTS) IMPLANT
COVER SWIFTLINK CONNECTOR (BAG) ×1 IMPLANT
PACK EP LATEX FREE (CUSTOM PROCEDURE TRAY) ×1
PACK EP LF (CUSTOM PROCEDURE TRAY) ×1 IMPLANT
PAD DEFIB RADIO PHYSIO CONN (PAD) ×1 IMPLANT
PATCH CARTO3 (PAD) IMPLANT
SHEATH CARTO VIZIGO SM CVD (SHEATH) IMPLANT
SHEATH PINNACLE 7F 10CM (SHEATH) IMPLANT
SHEATH PINNACLE 8F 10CM (SHEATH) IMPLANT
SHEATH PINNACLE 9F 10CM (SHEATH) IMPLANT
SHEATH PROBE COVER 6X72 (BAG) IMPLANT
TUBING SMART ABLATE COOLFLOW (TUBING) IMPLANT

## 2023-06-29 NOTE — Anesthesia Preprocedure Evaluation (Addendum)
Anesthesia Evaluation  Patient identified by MRN, date of birth, ID band Patient awake    Reviewed: Allergy & Precautions, NPO status , Patient's Chart, lab work & pertinent test results  Airway Mallampati: II  TM Distance: >3 FB Neck ROM: Full    Dental  (+) Upper Dentures, Edentulous Lower   Pulmonary former smoker   Pulmonary exam normal        Cardiovascular hypertension, Pt. on medications + CAD  Normal cardiovascular exam+ dysrhythmias Atrial Fibrillation + pacemaker      Neuro/Psych   Anxiety      Neuromuscular disease    GI/Hepatic negative GI ROS, Neg liver ROS,,,  Endo/Other  negative endocrine ROS    Renal/GU negative Renal ROS     Musculoskeletal  (+) Arthritis ,    Abdominal   Peds  Hematology  (+) Blood dyscrasia (Eliquis)   Anesthesia Other Findings A-fib  Reproductive/Obstetrics                             Anesthesia Physical Anesthesia Plan  ASA: 3  Anesthesia Plan: General   Post-op Pain Management:    Induction: Intravenous  PONV Risk Score and Plan: 2 and Ondansetron, Dexamethasone and Treatment may vary due to age or medical condition  Airway Management Planned: Oral ETT  Additional Equipment:   Intra-op Plan:   Post-operative Plan: Extubation in OR  Informed Consent: I have reviewed the patients History and Physical, chart, labs and discussed the procedure including the risks, benefits and alternatives for the proposed anesthesia with the patient or authorized representative who has indicated his/her understanding and acceptance.     Dental advisory given  Plan Discussed with: CRNA  Anesthesia Plan Comments:         Anesthesia Quick Evaluation

## 2023-06-29 NOTE — Transfer of Care (Signed)
Immediate Anesthesia Transfer of Care Note  Patient: Todd Salazar  Procedure(s) Performed: ATRIAL FIBRILLATION ABLATION  Patient Location: Cath Lab  Anesthesia Type:General  Level of Consciousness: awake, drowsy, and patient cooperative  Airway & Oxygen Therapy: Patient Spontanous Breathing and Patient connected to nasal cannula oxygen  Post-op Assessment: Report given to RN and Post -op Vital signs reviewed and stable  Post vital signs: Reviewed and stable  Last Vitals:  Vitals Value Taken Time  BP    Temp    Pulse 79 06/29/23 1512  Resp 16 06/29/23 1512  SpO2 95 % 06/29/23 1512  Vitals shown include unfiled device data.  Last Pain:  Vitals:   06/29/23 1130  TempSrc:   PainSc: 3          Complications: There were no known notable events for this encounter.

## 2023-06-29 NOTE — Discharge Instructions (Signed)

## 2023-06-29 NOTE — Anesthesia Procedure Notes (Signed)
Procedure Name: Intubation Date/Time: 06/29/2023 1:12 PM  Performed by: Gus Puma, CRNAPre-anesthesia Checklist: Patient identified, Emergency Drugs available, Suction available and Patient being monitored Patient Re-evaluated:Patient Re-evaluated prior to induction Oxygen Delivery Method: Circle System Utilized Preoxygenation: Pre-oxygenation with 100% oxygen Induction Type: IV induction Ventilation: Mask ventilation without difficulty Laryngoscope Size: Mac and 4 Grade View: Grade II Tube type: Oral Tube size: 7.5 mm Number of attempts: 1 Airway Equipment and Method: Stylet Placement Confirmation: ETT inserted through vocal cords under direct vision, positive ETCO2 and breath sounds checked- equal and bilateral Secured at: 23 cm Tube secured with: Tape Dental Injury: Teeth and Oropharynx as per pre-operative assessment

## 2023-06-29 NOTE — H&P (Signed)
Electrophysiology Office Note   Date:  06/29/2023   ID:  Todd Salazar, DOB 1936/04/02, MRN [id number]  PCP:  Patient, No Pcp Per  Cardiologist:   Primary Electrophysiologist:  Todd Khurana [name], MD    Chief Complaint: AF   History of Present Illness: Todd Salazar is a 87 y.o. male who is being seen today for the evaluation of AF at the request of No ref. provider found. Presenting today for electrophysiology evaluation.  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  He presented to the hospital September 2022 in atrial fibrillation and was loaded on dofetilide.  He is post atrial fibrillation ablation 10/29/2021.  Post ablation he presented to the emergency room with pericarditis and was started on colchicine.  Today, denies symptoms of palpitations, chest pain, shortness of breath, orthopnea, PND, lower extremity edema, claudication, dizziness, presyncope, syncope, bleeding, or neurologic sequela. The patient is tolerating medications without difficulties. Plan ablation today.    Past Medical History:  Diagnosis Date   Arthritis    Atrial fibrillation (HCC)    Cancer (HCC)    prostate   Dysrhythmia    afib   History of kidney stones    Past Surgical History:  Procedure Laterality Date   ATRIAL FIBRILLATION ABLATION N/A 10/29/2021   Procedure: ATRIAL FIBRILLATION ABLATION;  Surgeon: Regan Lemming, MD;  Location: [facility] INVASIVE CV LAB;  Service: Cardiovascular;  Laterality: N/A;   Bone spur     CARDIOVERSION N/A 06/01/2022   Procedure: CARDIOVERSION;  Surgeon: Little Ishikawa, MD;  Location: [facility] ENDOSCOPY;  Service: Cardiovascular;  Laterality: N/A;   CHOLECYSTECTOMY     HERNIA REPAIR     JOINT REPLACEMENT     kidney stones     LAMINECTOMY WITH POSTERIOR LATERAL ARTHRODESIS  LEVEL 1 Right 11/25/2019   Procedure: Laminectomy and Foraminotomy  Lumbar Four-Lumbar Five - right, with instrumented fusion;  Surgeon: Tia Alert, MD;  Location: [facility] OR;  Service: Neurosurgery;  Laterality: Right;  Laminectomy and Foraminotomy  Lumbar Four-Lumbar Five - right, with instrumented fusion   PROSTATE SURGERY     TEE WITHOUT CARDIOVERSION N/A 06/01/2022   Procedure: TRANSESOPHAGEAL ECHOCARDIOGRAM (TEE);  Surgeon: Little Ishikawa, MD;  Location: [facility] ENDOSCOPY;  Service: Cardiovascular;  Laterality: N/A;     Current Facility-Administered Medications  Medication Dose Route Frequency Provider Last Rate Last Admin   0.9 %  sodium chloride infusion   Intravenous Continuous Regan Lemming, MD 50 mL/hr at 06/29/23 1141 New Bag at 06/29/23 1141    Allergies:   Ciprofloxacin, Tramadol, Duloxetine, Hydrocodone-acetaminophen, Nisoldipine, Benadryl [diphenhydramine], Methocarbamol, Ondansetron hcl, and Sulfa antibiotics   Social History:  The patient  reports that he quit smoking about 26 years ago. His smoking use included cigarettes. He has never used smokeless tobacco. He reports that he does not drink alcohol and does not use drugs.   Family History:  The patient's family history includes Hypertension in his father.   ROS:  Please see the history of present illness.   Otherwise, review of systems is positive for none.   All other systems are reviewed and negative.   PHYSICAL EXAM: VS:  BP 118/60   Pulse 64   Temp 98.4 F (36.9 C) (Temporal)   Resp 16   Ht 6\' 3"  (1.905 m)   Wt 86.2 kg   SpO2 97%   BMI 23.75 kg/m  ,  BMI Body mass index is 23.75 kg/m. GEN: Well nourished, well developed, in no acute distress  HEENT: normal  Neck: no JVD, carotid bruits, or masses Cardiac: RRR; no murmurs, rubs, or gallops,no edema  Respiratory:  clear to auscultation bilaterally, normal work of breathing GI: soft, nontender, nondistended, + BS MS: no deformity or atrophy  Skin:  warm and dry Neuro:  Strength and sensation are intact Psych: euthymic mood, full affect    Recent Labs: 10/28/2022: Magnesium 2.1 06/12/2023: Hemoglobin 10.4; Platelets 255 06/22/2023: BUN 18; Creatinine, Ser 1.23; Potassium 5.0; Sodium 139    Lipid Panel     Component Value Date/Time   CHOL 174 12/11/2011 0500   TRIG 123 12/11/2011 0500   HDL 29 (L) 12/11/2011 0500   CHOLHDL 6.0 12/11/2011 0500   VLDL 25 12/11/2011 0500   LDLCALC 120 (H) 12/11/2011 0500     Wt Readings from Last 3 Encounters:  06/29/23 86.2 kg  03/20/23 88 kg  12/15/22 87.5 kg      Other studies Reviewed: Additional studies/ records that were reviewed today include: TTE 03/17/23  Review of the above records today demonstrates:  There is mild concentric left ventricular hypertrophy with normal wall motion, normal systolic  function and ejection fraction  55-60% .  Device lead in the right ventricle  There is mild tricuspid regurgitation.  Estimated right ventricular systolic pressure is 49 mmHg.  Moderate pulmonary hypertension.  Probably no significant change in comparison with the prior study noted   Cardiac monitor 10/04/2022 personally reviewed Dominant rhythm is sinus rhythm with intermittent atrial and ventricular pacing 110 SVT episodes, longest and fastest 40 minutes at 125 bpm 11% PVC burden No triggered episodes recorded  ASSESSMENT AND PLAN:  1.  Persistent atrial fibrillation/flutter: [name] has presented today for surgery, with the diagnosis of AF.  The various methods of treatment have been discussed with the patient and family. After consideration of risks, benefits and other options for treatment, the patient has consented to  Procedure(s): Catheter ablation as a surgical intervention .  Risks include but not limited to complete heart block, stroke, esophageal damage, nerve damage, bleeding, vascular damage, tamponade, perforation, MI, and death. The patient's history has been  reviewed, patient examined, no change in status, stable for surgery.  I have reviewed the patient's chart and labs.  Questions were answered to the patient's satisfaction.    Lariah Fleer Elberta Fortis, MD 06/29/2023 12:13 PM

## 2023-07-01 ENCOUNTER — Emergency Department (HOSPITAL_COMMUNITY): Payer: PPO

## 2023-07-01 ENCOUNTER — Encounter (HOSPITAL_COMMUNITY): Payer: Self-pay

## 2023-07-01 ENCOUNTER — Inpatient Hospital Stay (HOSPITAL_BASED_OUTPATIENT_CLINIC_OR_DEPARTMENT_OTHER): Payer: PPO

## 2023-07-01 ENCOUNTER — Other Ambulatory Visit: Payer: Self-pay

## 2023-07-01 ENCOUNTER — Observation Stay (HOSPITAL_COMMUNITY)
Admission: EM | Admit: 2023-07-01 | Discharge: 2023-07-02 | Disposition: A | Discharge status: Home / Self Care | Payer: PPO | Attending: Cardiovascular Disease | Admitting: Cardiovascular Disease

## 2023-07-01 DIAGNOSIS — R091 Pleurisy: Secondary | ICD-10-CM | POA: Diagnosis present

## 2023-07-01 DIAGNOSIS — I480 Paroxysmal atrial fibrillation: Secondary | ICD-10-CM

## 2023-07-01 DIAGNOSIS — I319 Disease of pericardium, unspecified: Secondary | ICD-10-CM | POA: Diagnosis present

## 2023-07-01 DIAGNOSIS — I308 Other forms of acute pericarditis: Secondary | ICD-10-CM

## 2023-07-01 DIAGNOSIS — I3139 Other pericardial effusion (noninflammatory): Secondary | ICD-10-CM | POA: Diagnosis not present

## 2023-07-01 DIAGNOSIS — D500 Iron deficiency anemia secondary to blood loss (chronic): Secondary | ICD-10-CM | POA: Insufficient documentation

## 2023-07-01 DIAGNOSIS — I4892 Unspecified atrial flutter: Secondary | ICD-10-CM | POA: Diagnosis not present

## 2023-07-01 DIAGNOSIS — Z950 Presence of cardiac pacemaker: Secondary | ICD-10-CM | POA: Diagnosis not present

## 2023-07-01 DIAGNOSIS — Z87891 Personal history of nicotine dependence: Secondary | ICD-10-CM | POA: Diagnosis not present

## 2023-07-01 DIAGNOSIS — I495 Sick sinus syndrome: Secondary | ICD-10-CM

## 2023-07-01 DIAGNOSIS — I309 Acute pericarditis, unspecified: Principal | ICD-10-CM | POA: Insufficient documentation

## 2023-07-01 DIAGNOSIS — Z79899 Other long term (current) drug therapy: Secondary | ICD-10-CM | POA: Diagnosis not present

## 2023-07-01 DIAGNOSIS — Z8546 Personal history of malignant neoplasm of prostate: Secondary | ICD-10-CM | POA: Diagnosis not present

## 2023-07-01 DIAGNOSIS — D649 Anemia, unspecified: Secondary | ICD-10-CM | POA: Diagnosis present

## 2023-07-01 DIAGNOSIS — Z7901 Long term (current) use of anticoagulants: Secondary | ICD-10-CM | POA: Insufficient documentation

## 2023-07-01 LAB — HEPATIC FUNCTION PANEL
ALT: 12 U/L (ref 0–44)
AST: 20 U/L (ref 15–41)
Albumin: 3.9 g/dL (ref 3.5–5.0)
Alkaline Phosphatase: 69 U/L (ref 38–126)
Bilirubin, Direct: 0.3 mg/dL — ABNORMAL HIGH (ref 0.0–0.2)
Indirect Bilirubin: 1.1 mg/dL — ABNORMAL HIGH (ref 0.3–0.9)
Total Bilirubin: 1.4 mg/dL — ABNORMAL HIGH (ref 0.3–1.2)
Total Protein: 6.4 g/dL — ABNORMAL LOW (ref 6.5–8.1)

## 2023-07-01 LAB — ECHOCARDIOGRAM COMPLETE
AR max vel: 3.43 cm2
AV Area VTI: 3.32 cm2
AV Area mean vel: 3.4 cm2
AV Mean grad: 3 mmHg
AV Peak grad: 5.3 mmHg
Ao pk vel: 1.15 m/s
Area-P 1/2: 5.02 cm2
Height: 75 in
S' Lateral: 2.8 cm
Weight: 3068.8 oz

## 2023-07-01 LAB — BASIC METABOLIC PANEL
Anion gap: 9 (ref 5–15)
BUN: 19 mg/dL (ref 8–23)
CO2: 22 mmol/L (ref 22–32)
Calcium: 8.8 mg/dL — ABNORMAL LOW (ref 8.9–10.3)
Chloride: 109 mmol/L (ref 98–111)
Creatinine, Ser: 1.28 mg/dL — ABNORMAL HIGH (ref 0.61–1.24)
GFR, Estimated: 55 mL/min — ABNORMAL LOW (ref >60)
Glucose, Bld: 115 mg/dL — ABNORMAL HIGH (ref 70–99)
Potassium: 4 mmol/L (ref 3.5–5.1)
Sodium: 140 mmol/L (ref 135–145)

## 2023-07-01 LAB — PROTIME-INR
INR: 1.4 — ABNORMAL HIGH (ref 0.8–1.2)
Prothrombin Time: 17.5 seconds — ABNORMAL HIGH (ref 11.4–15.2)

## 2023-07-01 LAB — CBC
HCT: 26.3 % — ABNORMAL LOW (ref 39.0–52.0)
Hemoglobin: 8.5 g/dL — ABNORMAL LOW (ref 13.0–17.0)
MCH: 33.3 pg (ref 26.0–34.0)
MCHC: 32.3 g/dL (ref 30.0–36.0)
MCV: 103.1 fL — ABNORMAL HIGH (ref 80.0–100.0)
Platelets: 270 10*3/uL (ref 150–400)
RBC: 2.55 MIL/uL — ABNORMAL LOW (ref 4.22–5.81)
RDW: 25.1 % — ABNORMAL HIGH (ref 11.5–15.5)
WBC: 12.6 10*3/uL — ABNORMAL HIGH (ref 4.0–10.5)
nRBC: 0.5 % — ABNORMAL HIGH (ref 0.0–0.2)

## 2023-07-01 LAB — LIPASE, BLOOD: Lipase: 29 U/L (ref 11–51)

## 2023-07-01 LAB — SEDIMENTATION RATE: Sed Rate: 3 mm/hr (ref 0–16)

## 2023-07-01 LAB — TROPONIN I (HIGH SENSITIVITY)
Troponin I (High Sensitivity): 556 ng/L (ref <18)
Troponin I (High Sensitivity): 505 ng/L (ref <18)

## 2023-07-01 LAB — C-REACTIVE PROTEIN: CRP: 2.8 mg/dL — ABNORMAL HIGH (ref <1.0)

## 2023-07-01 MED ORDER — FUROSEMIDE 10 MG/ML IJ SOLN
20.0000 mg | Freq: Two times a day (BID) | INTRAMUSCULAR | Status: DC
  Administered 2023-07-01 – 2023-07-02 (×3): 20 mg via INTRAVENOUS
  Filled 2023-07-01 (×3): qty 2

## 2023-07-01 MED ORDER — COLCHICINE 0.6 MG PO TABS
0.6000 mg | ORAL_TABLET | Freq: Once | ORAL | Status: AC
  Administered 2023-07-01: 0.6 mg via ORAL
  Filled 2023-07-01: qty 1

## 2023-07-01 MED ORDER — PROCHLORPERAZINE EDISYLATE 10 MG/2ML IJ SOLN
10.0000 mg | Freq: Once | INTRAMUSCULAR | Status: AC
  Administered 2023-07-01: 10 mg via INTRAVENOUS
  Filled 2023-07-01: qty 2

## 2023-07-01 MED ORDER — APIXABAN 5 MG PO TABS
5.0000 mg | ORAL_TABLET | Freq: Two times a day (BID) | ORAL | Status: DC
  Administered 2023-07-01 – 2023-07-02 (×3): 5 mg via ORAL
  Filled 2023-07-01 (×3): qty 1

## 2023-07-01 MED ORDER — OXYCODONE-ACETAMINOPHEN 5-325 MG PO TABS: 1.0000 | ORAL_TABLET | ORAL | Status: DC | PRN

## 2023-07-01 MED ORDER — HYDROMORPHONE HCL 1 MG/ML IJ SOLN
1.0000 mg | Freq: Once | INTRAMUSCULAR | Status: AC
  Administered 2023-07-01: 1 mg via INTRAVENOUS
  Filled 2023-07-01: qty 1

## 2023-07-01 MED ORDER — SODIUM CHLORIDE 0.9 % IV SOLN
100.0000 mg | Freq: Once | INTRAVENOUS | Status: AC
  Administered 2023-07-01: 100 mg via INTRAVENOUS
  Filled 2023-07-01: qty 100

## 2023-07-01 MED ORDER — HYDROMORPHONE HCL 1 MG/ML IJ SOLN
1.0000 mg | INTRAMUSCULAR | Status: DC | PRN
  Administered 2023-07-01: 1 mg via INTRAVENOUS
  Filled 2023-07-01: qty 1

## 2023-07-01 MED ORDER — ONDANSETRON HCL 4 MG/2ML IJ SOLN
4.0000 mg | Freq: Four times a day (QID) | INTRAMUSCULAR | Status: DC | PRN
  Administered 2023-07-01: 4 mg via INTRAVENOUS
  Filled 2023-07-01: qty 2

## 2023-07-01 MED ORDER — COLCHICINE 0.6 MG PO TABS
0.6000 mg | ORAL_TABLET | Freq: Every day | ORAL | Status: DC
  Administered 2023-07-02: 0.6 mg via ORAL
  Filled 2023-07-01: qty 1

## 2023-07-01 MED ORDER — METOCLOPRAMIDE HCL 5 MG/ML IJ SOLN
10.0000 mg | Freq: Once | INTRAMUSCULAR | Status: AC
  Administered 2023-07-01: 10 mg via INTRAVENOUS
  Filled 2023-07-01: qty 2

## 2023-07-01 MED ORDER — KETOROLAC TROMETHAMINE 30 MG/ML IJ SOLN
30.0000 mg | Freq: Once | INTRAMUSCULAR | Status: AC
  Administered 2023-07-01: 30 mg via INTRAVENOUS
  Filled 2023-07-01: qty 1

## 2023-07-01 MED ORDER — NITROGLYCERIN 0.4 MG SL SUBL: 0.4000 mg | SUBLINGUAL_TABLET | SUBLINGUAL | Status: DC | PRN

## 2023-07-01 MED ORDER — ACETAMINOPHEN 325 MG PO TABS: 650.0000 mg | ORAL_TABLET | ORAL | Status: DC | PRN

## 2023-07-01 MED ORDER — LACTATED RINGERS IV BOLUS
1000.0000 mL | Freq: Once | INTRAVENOUS | Status: AC
  Administered 2023-07-01: 1000 mL via INTRAVENOUS

## 2023-07-01 MED ORDER — IOHEXOL 350 MG/ML SOLN
100.0000 mL | Freq: Once | INTRAVENOUS | Status: AC | PRN
  Administered 2023-07-01: 100 mL via INTRAVENOUS

## 2023-07-01 NOTE — ED Provider Notes (Signed)
[place] EMERGENCY DEPARTMENT AT [facility] Provider Note   CSN: [id number] Arrival date & time: 07/01/23  0128     History  Chief Complaint  Patient presents with   Chest Pain    Todd Salazar is a 87 y.o. male.  87 year old male presents with chest pain.  Patient had a cardiac ablation yesterday, July 18, for A-fib.  Reviewing the notes he did get general anesthesia for that and there was no obvious complications.  Patient went home was doing fine but then on Friday, July 19, had acute onset of pleuritic central chest pain that is sharp and goes mostly in retrosternal area. Persisted with mild dyspnea so came here for eval. No h/o MI. No LE edema, nausea, diaphoresis. Tried tylenol which did not seem to help. On eliquis    Chest Pain      Home Medications Prior to Admission medications   Medication Sig Start Date End Date Taking? Authorizing Provider  amiodarone (PACERONE) 200 MG tablet Take 1 tablet (200 mg total) TWICE a day for one month, then reduce and take 1 tablet ONCE a day thereafter Patient not taking: Reported on 06/26/2023 03/20/23   Regan Lemming, MD  diltiazem (CARDIZEM CD) 180 MG 24 hr capsule Take 1 capsule (180 mg total) by mouth daily. Patient not taking: Reported on 06/26/2023 04/14/23   Regan Lemming, MD  ELIQUIS 5 MG TABS tablet TAKE 1 TABLET BY MOUTH 2 TIMES A DAY 04/11/23   Camnitz, Andree Coss, MD  gabapentin (NEURONTIN) 100 MG capsule Take 100 mg by mouth See admin instructions. Take with 300 mg for a total of 400 mg at bedtime 01/24/22   [provider]  gabapentin (NEURONTIN) 300 MG capsule Take 300 mg by mouth See admin instructions. Take with 100 mg for a total of 400 mg at bedtime    [provider]  nitroGLYCERIN (NITROSTAT) 0.4 MG SL tablet Place 0.4 mg under the tongue every 5 (five) minutes as needed for chest pain. 03/18/22   [provider]      Allergies    Ciprofloxacin, Tramadol, Duloxetine,  Hydrocodone-acetaminophen, Nisoldipine, Benadryl [diphenhydramine], Methocarbamol, Ondansetron hcl, and Sulfa antibiotics    Review of Systems   Review of Systems  Cardiovascular:  Positive for chest pain.    Physical Exam Updated Vital Signs BP 112/63   Pulse 76   Temp 98.1 F (36.7 C)   Resp 17   Ht 6\' 3"  (1.905 m)   Wt 87 kg   SpO2 99%   BMI 23.97 kg/m  Physical Exam Vitals and nursing note reviewed.  Constitutional:      Appearance: He is well-developed.  HENT:     Head: Normocephalic and atraumatic.  Cardiovascular:     Rate and Rhythm: Normal rate.  Pulmonary:     Effort: Pulmonary effort is normal. Tachypnea present. No respiratory distress.     Breath sounds: No decreased breath sounds.     Comments: Breath sounds ok, but severe pain when taking deep breath Abdominal:     General: There is no distension.  Musculoskeletal:        General: Normal range of motion.     Cervical back: Normal range of motion.     Right lower leg: No edema.     Left lower leg: No edema.  Skin:    General: Skin is warm and dry.  Neurological:     Mental Status: He is alert.     ED Results /  Procedures / Treatments   Labs (all labs ordered are listed, but only abnormal results are displayed) Labs Reviewed  BASIC METABOLIC PANEL - Abnormal; Notable for the following components:      Result Value   Glucose, Bld 115 (*)    Creatinine, Ser 1.28 (*)    Calcium 8.8 (*)    GFR, Estimated 55 (*)    All other components within normal limits  CBC - Abnormal; Notable for the following components:   WBC 12.6 (*)    RBC 2.55 (*)    Hemoglobin 8.5 (*)    HCT 26.3 (*)    MCV 103.1 (*)    RDW 25.1 (*)    nRBC 0.5 (*)    All other components within normal limits  PROTIME-INR - Abnormal; Notable for the following components:   Prothrombin Time 17.5 (*)    INR 1.4 (*)    All other components within normal limits  HEPATIC FUNCTION PANEL - Abnormal; Notable for the following components:    Total Protein 6.4 (*)    Total Bilirubin 1.4 (*)    Bilirubin, Direct 0.3 (*)    Indirect Bilirubin 1.1 (*)    All other components within normal limits  TROPONIN I (HIGH SENSITIVITY) - Abnormal; Notable for the following components:   Troponin I (High Sensitivity) 556 (*)    All other components within normal limits  TROPONIN I (HIGH SENSITIVITY) - Abnormal; Notable for the following components:   Troponin I (High Sensitivity) 505 (*)    All other components within normal limits  LIPASE, BLOOD    EKG EKG Interpretation Date/Time:  Saturday July 01 2023 01:35:29 EDT Ventricular Rate:  80 PR Interval:  206 QRS Duration:  119 QT Interval:  393 QTC Calculation: 454 R Axis:   11  Text Interpretation: Sinus rhythm Nonspecific intraventricular conduction delay mostly same as yesterday after  ablation Confirmed by Marily Memos [phone]) on 07/01/2023 1:58:55 AM   EKG Interpretation Date/Time:  Saturday July 01 2023 02:39:40 EDT Ventricular Rate:  81 PR Interval:  213 QRS Duration:  121 QT Interval:  397 QTC Calculation: 461 R Axis:   15  Text Interpretation: Sinus rhythm Borderline prolonged PR interval Nonspecific intraventricular conduction delay No significant change since last tracing Confirmed by Marily Memos [phone]) on 07/01/2023 2:58:57 AM          Radiology CT Angio Chest/Abd/Pel for Dissection W and/or Wo Contrast  Result Date: 07/01/2023 CLINICAL DATA:  Acute aortic syndrome suspected. Chest pain, ablation on Friday. EXAM: CT ANGIOGRAPHY CHEST, ABDOMEN AND PELVIS TECHNIQUE: Non-contrast CT of the chest was initially obtained. Multidetector CT imaging through the chest, abdomen and pelvis was performed using the standard protocol during bolus administration of intravenous contrast. Multiplanar reconstructed images and MIPs were obtained and reviewed to evaluate the vascular anatomy. RADIATION DOSE REDUCTION: This exam was performed according to the departmental  dose-optimization program which includes automated exposure control, adjustment of the mA and/or kV according to patient size and/or use of iterative reconstruction technique. CONTRAST:  OMNIPAQUE IOHEXOL 350 MG/ML SOLN COMPARISON:  04/11/2022. FINDINGS: CTA CHEST FINDINGS Cardiovascular: The heart is normal in size and there is a small pericardial effusion. Pacemaker leads are noted in the heart. Scattered coronary artery calcifications are present. There is atherosclerotic calcification of the aorta without evidence of aneurysm or dissection. The pulmonary trunk is mildly distended suggesting underlying pulmonary artery hypertension. Mediastinum/Nodes: No mediastinal, hilar, or axillary lymphadenopathy. The thyroid gland, trachea, and esophagus are within normal  limits. Lungs/Pleura: Biapical pleural and parenchymal scarring is noted bilaterally. Mild bronchial wall thickening is noted bilaterally. Focal consolidation is noted in the left lower lobe, axial image 120. Atelectasis is present bilaterally. No effusion or pneumothorax. A 4 mm nodule is noted in the right middle lobe, axial image 97. There is a 4 mm nodule in the right middle lobe, axial image 110. Musculoskeletal: Degenerative changes are present in the thoracic spine. No acute or suspicious osseous abnormality. A pacemaker device is noted in the anterior chest wall on the left. Review of the MIP images confirms the above findings. CTA ABDOMEN AND PELVIS FINDINGS VASCULAR Aorta: Normal caliber aorta without aneurysm, dissection, vasculitis or significant stenosis. Aortic atherosclerosis. Celiac: Patent without evidence of aneurysm, dissection, vasculitis or significant stenosis. SMA: Patent without evidence of aneurysm, dissection, vasculitis or significant stenosis. Renals: Both renal arteries are patent without evidence of aneurysm, dissection, vasculitis, fibromuscular dysplasia or significant stenosis. IMA: Patent without evidence of  aneurysm, dissection, vasculitis or significant stenosis. Inflow: Patent without evidence of aneurysm, dissection, vasculitis or significant stenosis. Veins: No obvious venous abnormality within the limitations of this arterial phase study. Review of the MIP images confirms the above findings. NON-VASCULAR Hepatobiliary: No focal liver abnormality is seen. Status post cholecystectomy. No biliary dilatation. Pancreas: Unremarkable. No pancreatic ductal dilatation or surrounding inflammatory changes. Spleen: Normal in size without focal abnormality. Adrenals/Urinary Tract: The adrenal glands are within normal limits. The kidneys enhance symmetrically. Cysts are present in the kidneys bilaterally. Nonobstructive renal calculi are noted on the left. No hydronephrosis bilaterally. The bladder is not seen due to streak artifact. Stomach/Bowel: Stomach is within normal limits. Appendix appears normal. No evidence of bowel wall thickening, distention, or inflammatory changes. No free air or pneumatosis. Scattered diverticula are present along the colon without evidence of diverticulitis. Lymphatic: No abdominal or pelvic lymphadenopathy. Reproductive: Prostate gland is not seen due to hardware artifact. A penile prosthesis is noted with reservoir in the low anterior pelvis. Other: No abdominopelvic ascites. Musculoskeletal: Degenerative changes are present in the lumbar spine. Spinal fusion hardware is present from L3-L5. Total hip arthroplasty changes are present bilaterally. No acute osseous abnormality. Review of the MIP images confirms the above findings. IMPRESSION: 1. No evidence of aortic aneurysm or dissection. 2. Focal consolidation in the right lower lobe, possible pneumonia. Follow-up is recommended until resolution. 3. Small pericardial effusion with coronary artery calcifications. 4. 4 mm right lower lobe pulmonary nodules. No follow-up needed if patient is low-risk (and has no known or suspected primary  neoplasm). Non-contrast chest CT can be considered in 12 months if patient is high-risk. This recommendation follows the consensus statement: Guidelines for Management of Incidental Pulmonary Nodules Detected on CT Images: From the Fleischner Society 2017; Radiology 2017; 284:228-243. 5. Nonobstructive left renal calculi. Electronically Signed   By: Thornell Sartorius M.D.   On: 07/01/2023 03:47   DG Chest 2 View  Result Date: 07/01/2023 CLINICAL DATA:  Chest pain. EXAM: CHEST - 2 VIEW COMPARISON:  05/30/2022. FINDINGS: The heart is enlarged and the mediastinal contour is within normal limits. Hyperinflation of the lungs is noted. Patchy airspace disease is noted at the lung bases bilaterally, greater on the left than on the right. No effusion or pneumothorax. Apical pleural thickening is noted bilaterally. A dual lead pacemaker is present over the left chest. No acute osseous abnormality. IMPRESSION: 1. Patchy airspace disease at the lung bases, suspicious for developing pneumonia. 2. Cardiomegaly. Electronically Signed   By: Charlestine Night.D.  On: 07/01/2023 02:47   EP STUDY  Result Date: 06/29/2023 SURGEON:  Loman Brooklyn, MD PREPROCEDURE DIAGNOSES: 1. Persistent atrial fibrillation. 2.  Atypical atrial flutter POSTPROCEDURE DIAGNOSES: 1. Persistent atrial fibrillation. 2.  Atypical atrial flutter PROCEDURES: 1. Comprehensive electrophysiologic study. 2. Coronary sinus pacing and recording. 3. Three-dimensional mapping of atrial fibrillation (with additional mapping and ablation within the left atrium due to persistence of afib) 4. Ablation of atrial fibrillation (with additional mapping and ablation within the left atrium due to persistence of afib) 5. Intracardiac echocardiography. 6. Transseptal puncture of an intact septum. 7. Arrhythmia induction with pacing 8. External cardioversion. INTRODUCTION:  Todd Salazar is a 87 y.o. male with a history of persistent atrial fibrillation who now presents for EP  study and radiofrequency ablation.  The patient reports initially being diagnosed with atrial fibrillation after presenting with symptomatic palpitations and fatgiue.  He is a prior ablation but has had more episodes of atrial fibrillation and flutter.  The patient therefore presents today for catheter ablation of atrial fibrillation. DESCRIPTION OF PROCEDURE:  Informed written consent was obtained, and the patient was brought to the electrophysiology lab in a fasting state.  The patient was adequately sedated with intravenous medications as outlined in the anesthesia report.  The patient's left and right groins were prepped and draped in the usual sterile fashion by the EP lab staff.  Using a percutaneous Seldinger technique, two 8-French hemostasis sheaths were placed in the right femoral vein, and one 7 [place] and one 11-French hemostasis sheaths were placed into the left common femoral vein. An esophageal temperature probe was inserted to monitor for heating of the esophagus during the procedure.  Each sheath site was preclosed using an Abbott Perclose and was closed at the end of the case. Direct ultrasound guidance is used for right and left femoral veins with normal vessel patency. Ultrasound images are captured and stored in the patient's chart. Using ultrasound guidance, the Brockenbrough needle and wire were visualized entering the vessel. Catheter Placement:  A 7-French Biosense Webster Decapolar coronary sinus catheter was introduced through the right common femoral vein and advanced into the coronary sinus for recording and pacing from this location.  A luminal esophageal temperature probe was placed and used for continuous monitoring of the luminal esophageal temperature throughout the procedure as well as to localize the esophagus on fluoroscopy. In addition, the esophagus was directly visualized with intracardiac echo and its positioned marked on Carto.  During ablation at the posterior wall there  was limited esophageal heating noted during RF energy delivery with the maximal temperature recorded by the luminal temperature probe of < 38.5 degrees C. Initial Measurements: The patient presented to the electrophysiology lab in sinus rhythm. his  PR interval measured 197 msec with a QRS duration of 110 msec and a QT interval of 412 msec.   Intracardiac Echocardiography: An 8-French Biosense Webster AcuNav intracardiac echocardiography catheter was introduced through the right common femoral vein and advanced into the right atrium. Intracardiac echocardiography was performed of the left atrium, and a three-dimensional anatomical rendering of the left atrium was performed using CARTO sound technology.  The patient was noted to have a moderate sized left atrium.  The interatrial septum was prominent but not aneurysmal. All 4 pulmonary veins were visualized and noted to have separate ostia.  The pulmonary veins were moderate in size.  The left atrial appendage was visualized and did not reveal thrombus.   There was no evidence of pulmonary vein stenosis. Transseptal Puncture:  The right common femoral vein sheaths were exchanged for one 8.5 [employer] and one Bayless sheath and transseptal access was achieved with the Bayless in a standard fashion using a Bayless needle under fluoroscopy with intracardiac echocardiography confirmation of the transseptal puncture.  Once transseptal access had been achieved, heparin was administered intravenously and intra- arterially in order to maintain an ACT of greater than 350 seconds throughout the procedure.  3D Mapping and Ablation: A 3.5 mm Biosense [employer] ST/SF Thermocool ablation catheter was advanced into the right atrium through the Visigo sheath.  The transseptal sheath was pulled back into the IVC over a guidewire.  The ablation catheter was advanced across the transseptal hole using the wire as a guide.  The transseptal sheath was then  re-advanced over the guidewire into the left atrium.  A Biosense [employer] mapping catheter was introduced through the transseptal sheath and positioned over the mouth of all 4 pulmonary veins.  Three-dimensional electroanatomical mapping was performed using CARTO technology.  This demonstrated electrical activity within the right-sided pulmonary veins at baseline. The patient underwent successful sequential electrical isolation and anatomical encircling of the right sided pulmonary veins using radiofrequency current with a circular mapping catheter as a guide. A WACA approach was used. Due to persistence of atrial fibrillation, additional left atrial mapping and ablation was performed.  A series of radiofrequency lesions were delivered along the roof and floor of the left atrium in order to create a "standard box" lesion along the posterior wall of the left atrium. The patient went into atrial flutter with catheter movement.  3D mapping of atrial flutter showed an atrial flutter circuit that appeared to be along the anterior wall.  Ablation was performed from the left superior pulmonary vein to the mitral valve.  Tachycardia continued.  Ablation was then performed from the right superior pulmonary vein to the mitral valve.  Tachycardia lengthened, but did not terminate.  Due to a high burden of scar, the patient was cardioverted as below.  Bidirectional block was confirmed across the anterior ablation lines. Cardioversion: The patient was then cardioverted to sinus rhythm with a single synchronized 150-J biphasic shock with cardioversion electrodes in the anterior-posterior thoracic configuration.  he remained in sinus rhythm thereafter. Measurements Following Ablation: In sinus rhythm the RR interval was , with PR 199 msec, QRS 131 msec, and QT 439 msec.  Following ablation the AH interval measured 127 msec with an HV interval of 37 msec. Ventricular pacing was performed, which revealed VA  dissociation at 600 msec. Rapid atrial pacing was performed, which revealed an AV Wenckebach cycle length of 400 msec.  Electroisolation was then again confirmed in all four pulmonary veins. The procedure was therefore considered completed.  All catheters were removed, and the sheaths were aspirated and flushed.  The patient was transferred to the recovery area for sheath removal per protocol.  Intracardiac echocardiogram revealed no pericardial effusion. EBL<67ml.  There were no early apparent complications. CONCLUSIONS: 1.  Sinus rhythm upon presentation.  2. Successful electrical isolation and anatomical encircling of all four pulmonary veins with radiofrequency current.  A WACA approach was used 3. Additional left atrial ablation was performed with a standard box lesion created along the posterior wall of the left atrium 4.  Atrial flutter induced with catheter movement, ablation along the anterior wall of the left atrium 5.  Cardioversion from atrial flutter to sinus rhythm 6.  No early apparent complications. Will Martin Camnitz,MD 2:59 PM 06/29/2023  Procedures Procedures    Medications Ordered in ED Medications  doxycycline (VIBRAMYCIN) 100 mg in sodium chloride 0.9 % 250 mL IVPB (100 mg Intravenous New Bag/Given 07/01/23 4098)  HYDROmorphone (DILAUDID) injection 1 mg (has no administration in time range)  HYDROmorphone (DILAUDID) injection 1 mg (1 mg Intravenous Given 07/01/23 0205)  iohexol (OMNIPAQUE) 350 MG/ML injection 100 mL (100 mLs Intravenous Contrast Given 07/01/23 0320)  lactated ringers bolus 1,000 mL (0 mLs Intravenous Stopped 07/01/23 0626)  prochlorperazine (COMPAZINE) injection 10 mg (10 mg Intravenous Given 07/01/23 0538)    ED Course/ Medical Decision Making/ A&P                             Medical Decision Making Amount and/or Complexity of Data Reviewed Labs: ordered. Radiology: ordered.  Risk Prescription drug management.  Pleuritic chest pain, severe sharp and  stabbing in nature s/p ablation yesterday. Will rule out dissection, pneumothorax, pericardial effusion or other abnormality. Will check troponins with his medical history and mild ST-E on ECG but I expect them to be somewhat elevated but should be stable/declining.   First troponin elevated, pending second. Pain improved with dilaudid. Xr c/w bilateral LL opacities, pending ct.   Ct without dissection or obvious PE or other complication from procedure. Does redemonstrate possible pneumonia and small pericardial effusion. Still could be cardiac? PNA causing all the pain? Will hold on abx until second troponin and cardiology discussion.  Still with 4/10 chest pain. Second troponin slightly lower. Cardiology paged at 0527.  No call from cardiology, re-paged [phone].   0710: no call back still. Paged PA, recommended speaking to Dr. Royann Shivers, pending call back.   After further thought, consider pericarditis. Esr/crp added, colchicine ordered. Care transferred pending cardiology call back.     Final Clinical Impression(s) / ED Diagnoses Final diagnoses:  None    Rx / DC Orders ED Discharge Orders     None         Brennen Camper, Barbara Cower, MD 07/01/23 [phone]

## 2023-07-01 NOTE — ED Notes (Signed)
Patient transported to X-ray 

## 2023-07-01 NOTE — Progress Notes (Signed)
*  PRELIMINARY RESULTS* Echocardiogram 2D Echocardiogram has been performed.  Todd Salazar 07/01/2023, 11:42 AM

## 2023-07-01 NOTE — ED Notes (Signed)
EDP notified of critical lab values   Troponin 556

## 2023-07-01 NOTE — ED Notes (Signed)
Md at bedside to reassess patient

## 2023-07-01 NOTE — ED Provider Notes (Signed)
9:43 AM Assumed care of patient from off-going team. For more details, please see note from same day.  In brief, this is a 87 y.o. male - per note from same day = "Patient had a cardiac ablation yesterday, July 18, for A-fib. Reviewing the notes he did get general anesthesia for that and there was no obvious complications. Patient went home was doing fine but then on Friday, July 19, had acute onset of pleuritic central chest pain that is sharp and goes mostly in retrosternal area. Persisted with mild dyspnea so came here for eval. No h/o MI. No LE edema, nausea, diaphoresis. Tried tylenol which did not seem to help. On eliquis " Found to have elevated troponins - downtrending Pain is worse w/ lying down, consider pericarditis   Plan/Dispo at time of sign-out & ED Course since sign-out: [ ]  cards [ ]  admit  BP 131/84   Pulse 77   Temp 98.1 F (36.7 C)   Resp 13   Ht 6\' 3"  (1.905 m)   Wt 87 kg   SpO2 96%   BMI 23.97 kg/m    ED Course:   Clinical Course as of 07/01/23 0943  Sat Jul 01, 2023  4098 Repaged cardiology and discussed with them.  He does have an elevated CRP and elevated troponins.  Cardiology states that it is extremely common complication of ablation to have pericarditis.  His symptoms are consistent and cardiology agrees that his EKG is consistent with pericarditis as well.  He did not get provided of colchicine but will receive colchicine here in the emergency department.  Patient did have pericardial effusion on bedside ultrasound per off going physician and patient will be admitted to cardiology with formal echo planned as well as pain control. [HN]    Clinical Course User Index [HN] Loetta Rough, MD    Dispo: Admit to cardiology ------------------------------- Vivi Barrack, MD Emergency Medicine  This note was created using dictation software, which may contain spelling or grammatical errors.   Loetta Rough, MD 07/01/23 [phone]

## 2023-07-01 NOTE — ED Notes (Signed)
Pt resting with eyes closed; respirations spontaneous, even, unlabored 

## 2023-07-01 NOTE — Heart Team MDD (Signed)
Cardiology Admission History and Physical   Patient ID: Todd Salazar MRN: [id number]; DOB: 02/16/1936   Admission date: 07/01/2023  PCP:  Patient, No Pcp Per   [facility] Providers Cardiologist:  None  Electrophysiologist:  Will [name], MD       Chief Complaint:  chest pain  Patient Profile:   Todd Salazar is a 87 y.o. male with history of atrial fibrillation status post recent ablation who is being seen 07/01/2023 for the evaluation of pleuritic chest pain.  History of Present Illness:   Mr. 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   He had a previous atrial fibrillation ablation 10/29/2021, then had a redo ablation procedure 06/29/2023.  Note that that previous ablation procedure was complicated by pericarditis and resolved with treatment with colchicine.  Previously failed treatment with sotalol, flecainide, propafenone and was poorly tolerant of AV nodal blocking agents.  Was treated with dofetilide in September 2022 which actually led to spontaneous conversion to sinus rhythm, but he subsequently had antiarrhythmic failure with recurrent A-fib with RVR.  He was treated with amiodarone in September 2022 but this was subsequently discontinued after he had pulmonary vein isolation.  He was last hospitalized for atrial fibrillation rapid ventricular sponsor and underwent cardioversion 06/01/2022.  Starting at 6 PM yesterday, roughly 24 hours after his ablation procedure he began experiencing worsening chest pain which is described as worsened by laying supine or taking deep breaths.  It is somewhat improved by sitting up.  The pain escalated in severity reaching 9/10 and he came to  the emergency room.  He has obtained relief with Dilaudid, but each dose causes nausea and vomiting.  Has a history of nausea and vomiting with hydrocodone as well.  He has a history of laryngeal edema with tramadol.  He denies shortness of breath, dizziness, syncope or palpitations.  Right now he is fairly comfortable when sitting up and not taking a deep breath.  His ECG shows diffuse ST segment elevation, upwardly convex, evident in most leads with the exception of V1-V2 and aVR.  There is evidence of PR depression most obvious in leads II.  He has moderately elevated high-sensitivity troponin around 500, with a flat profile.  His C-reactive protein is elevated, but interestingly sedimentation rate is normal.  CT was reported as showing a small pericardial effusion, but by echocardiographic evaluation this is trivial, even physiological.  He does have evidence of increased right atrial pressures and elevated PA pressure.  Hard to estimate left atrial filling pressures due to what appears to be atrial stunning with diminutive A waves. E/e' is borderline at 12-15.   Past Medical History:  Diagnosis Date   Arthritis    Atrial fibrillation (HCC)    Cancer (HCC)    prostate   Dysrhythmia    afib   History of kidney stones     Past Surgical History:  Procedure Laterality Date   ATRIAL FIBRILLATION ABLATION N/A 10/29/2021   Procedure: ATRIAL FIBRILLATION ABLATION;  Surgeon: Regan Lemming, MD;  Location: [facility] INVASIVE CV LAB;  Service: Cardiovascular;  Laterality: N/A;   Bone spur     CARDIOVERSION N/A 06/01/2022   Procedure: CARDIOVERSION;  Surgeon: Little Ishikawa, MD;  Location: [facility] ENDOSCOPY;  Service: Cardiovascular;  Laterality: N/A;   CHOLECYSTECTOMY  HERNIA REPAIR     JOINT REPLACEMENT     kidney stones     LAMINECTOMY WITH POSTERIOR LATERAL ARTHRODESIS LEVEL 1 Right 11/25/2019   Procedure: Laminectomy and Foraminotomy  Lumbar Four-Lumbar Five - right, with instrumented  fusion;  Surgeon: Tia Alert, MD;  Location: [facility] OR;  Service: Neurosurgery;  Laterality: Right;  Laminectomy and Foraminotomy  Lumbar Four-Lumbar Five - right, with instrumented fusion   PROSTATE SURGERY     TEE WITHOUT CARDIOVERSION N/A 06/01/2022   Procedure: TRANSESOPHAGEAL ECHOCARDIOGRAM (TEE);  Surgeon: Little Ishikawa, MD;  Location[facility] ENDOSCOPY;  Service: Cardiovascular;  Laterality: N/A;     Medications Prior to Admission: Prior to Admission medications   Medication Sig Start Date End Date Taking? Authorizing Provider  acetaminophen (TYLENOL) 500 MG tablet Take 1,000 mg by mouth as needed for moderate pain.   Yes [provider]  Calcium Carbonate Antacid (TUMS PO) Take 4 tablets by mouth as needed (discomfort).   Yes [provider]  ELIQUIS 5 MG TABS tablet TAKE 1 TABLET BY MOUTH 2 TIMES A DAY 04/11/23  Yes Camnitz, Will Daphine Deutscher, MD  gabapentin (NEURONTIN) 100 MG capsule Take 100 mg by mouth See admin instructions. Take with 300 mg for a total of 400 mg at bedtime 01/24/22  Yes [provider]  gabapentin (NEURONTIN) 300 MG capsule Take 300 mg by mouth See admin instructions. Take with 100 mg for a total of 400 mg at bedtime   Yes [provider]  nitroGLYCERIN (NITROSTAT) 0.4 MG SL tablet Place 0.4 mg under the tongue every 5 (five) minutes as needed for chest pain. 03/18/22  Yes [provider]  amiodarone (PACERONE) 200 MG tablet Take 1 tablet (200 mg total) TWICE a day for one month, then reduce and take 1 tablet ONCE a day thereafter Patient not taking: Reported on 06/26/2023 03/20/23   Regan Lemming, MD  diltiazem (CARDIZEM CD) 180 MG 24 hr capsule Take 1 capsule (180 mg total) by mouth daily. Patient not taking: Reported on 06/26/2023 04/14/23   Regan Lemming, MD  famotidine (PEPCID) 20 MG tablet Take 20 mg by mouth 2 (two) times daily. Patient not taking: Reported on 07/01/2023 03/20/23   [provider]      Allergies:    Allergies  Allergen Reactions   Ciprofloxacin Anaphylaxis and Other (See Comments)    Dizziness, also   Tramadol Swelling and Other (See Comments)    Laryngeal edema   Duloxetine Other (See Comments)    Delusions    Hydrocodone-Acetaminophen Nausea And Vomiting   Nisoldipine Other (See Comments)    Pt unsure of reaction    Benadryl [Diphenhydramine] Anxiety and Other (See Comments)    Panic attack    Methocarbamol Nausea Only   Ondansetron Hcl Nausea Only   Sulfa Antibiotics Rash    Social History:   Social History   Socioeconomic History   Marital status: Widowed    Spouse name: Not on file   Number of children: Not on file   Years of education: Not on file   Highest education level: Not on file  Occupational History   Not on file  Tobacco Use   Smoking status: Former    Current packs/day: 0.00    Types: Cigarettes    Quit date: 12/10/1996    Years since quitting: 26.5   Smokeless tobacco: Never   Tobacco comments:    Former smoker (08/27/2021)  Vaping Use   Vaping status: Never Used  Substance and Sexual Activity   Alcohol use: No   Drug use: No   Sexual activity: Never  Other Topics Concern   Not on file  Social History Narrative   Not on file   Social Determinants of Health   Financial Resource Strain: Not on file  Food Insecurity: Low Risk  (03/18/2023)   [facility]   Food vital sign    Within the past 12 months, you worried that your food would run out before you got money to buy more: Never true    Within the past 12 months, the food you bought just didn't last and you didn't have money to get more. : Never true  Transportation Needs: No Transportation Needs (03/18/2023)   [facility]   Transportation    In the past 12 months, has lack of reliable transportation kept you from medical appointments, meetings, work or from getting things needed for daily living? : No   Physical Activity: Not on file  Stress: Not on file  Social Connections: Not on file  Intimate Partner Violence: Not on file    Family History:   The patient's family history includes Hypertension in his father. There is no history of CAD, Heart failure, or Stroke.    ROS:  Please see the history of present illness.  All other ROS reviewed and negative.     Physical Exam/Data:   Vitals:   07/01/23 1515 07/01/23 1530 07/01/23 1545 07/01/23 1627  BP: (!) 105/58 (!) 108/59 114/69 123/72  Pulse: 73 74 88 79  Resp: 14 14 20    Temp:    97.7 F (36.5 C)  TempSrc:    Oral  SpO2: 94% 94%  92%  Weight:      Height:       No intake or output data in the 24 hours ending 07/01/23 1810    07/01/2023    1:37 AM 06/29/2023   10:56 AM 03/20/2023    9:47 AM  Last 3 Weights  Weight (lbs) 191 lb 12.8 oz 190 lb 194 lb  Weight (kg) 87 kg 86.183 kg 87.998 kg     Body mass index is 23.97 kg/m.  General:  Well nourished, well developed, in no acute distress.  Left subclavian pacemaker site appears healthy HEENT: normal Neck: no JVD Vascular: No carotid bruits; Distal pulses 2+ bilaterally   Cardiac:  normal S1, S2; RRR; no murmur .  A 2 component pericardial rub is heard. Lungs:  clear to auscultation bilaterally, no wheezing, rhonchi or rales  Abd: soft, nontender, no hepatomegaly  Ext: no edema, no groin hematoma Musculoskeletal:  No deformities, BUE and BLE strength normal and equal Skin: warm and dry  Neuro:  CNs 2-12 intact, no focal abnormalities noted Psych:  Normal affect    EKG:  The ECG that was done today was personally reviewed and demonstrates sinus rhythm with diffuse ST segment elevation consistent with acute pericarditis  Relevant CV Studies:  TTE 03/17/23   There is mild concentric left ventricular hypertrophy with normal wall motion, normal systolic  function and ejection fraction  55-60% .  Device lead in the right ventricle  There is mild tricuspid regurgitation.   Estimated right ventricular systolic pressure is 49 mmHg.  Moderate pulmonary hypertension.  Probably no significant change in comparison with the prior study noted    Cardiac monitor 10/04/2022 personally reviewed Dominant rhythm is sinus rhythm with intermittent atrial and ventricular pacing 110 SVT  episodes, longest and fastest 40 minutes at 125 bpm 11% PVC burden No triggered episodes recorded  Laboratory Data:  High Sensitivity Troponin:   Recent Labs  Lab 07/01/23 0130 07/01/23 0345  TROPONINIHS 556* 505*      Chemistry Recent Labs  Lab 07/01/23 0130  NA 140  K 4.0  CL 109  CO2 22  GLUCOSE 115*  BUN 19  CREATININE 1.28*  CALCIUM 8.8*  GFRNONAA 55*  ANIONGAP 9    Recent Labs  Lab 07/01/23 0130  PROT 6.4*  ALBUMIN 3.9  AST 20  ALT 12  ALKPHOS 69  BILITOT 1.4*   Lipids No results for input(s): "CHOL", "TRIG", "HDL", "LABVLDL", "LDLCALC", "CHOLHDL" in the last 168 hours. Hematology Recent Labs  Lab 07/01/23 0130  WBC 12.6*  RBC 2.55*  HGB 8.5*  HCT 26.3*  MCV 103.1*  MCH 33.3  MCHC 32.3  RDW 25.1*  PLT 270   Thyroid No results for input(s): "TSH", "FREET4" in the last 168 hours. BNPNo results for input(s): "BNP", "PROBNP" in the last 168 hours.  DDimer No results for input(s): "DDIMER" in the last 168 hours.   Radiology/Studies:  ECHOCARDIOGRAM COMPLETE  Result Date: 07/01/2023    ECHOCARDIOGRAM REPORT   Patient Name:   VICKI PASQUAL Date of Exam: 07/01/2023 Medical Rec #:  [id number]    Height:       75.0 in Accession #:    [id number]   Weight:       191.8 lb Date of Birth:  01-22-1936    BSA:          2.156 m Patient Age:    87 years     BP:           107/60 mmHg Patient Gender: M            HR:           81 bpm. Exam Location:  Inpatient Procedure: 2D Echo, Cardiac Doppler and Color Doppler Indications:    Pericardial effusion I31.3  History:        Patient has prior history of Echocardiogram examinations, most                 recent 06/01/2022.  CAD, Pacemaker, CKD3, Signs/Symptoms:Chest                 Pain; Risk Factors:Hypertension and Former Smoker.  Sonographer:    Dondra Prader RVT RCS Referring Phys: [id number] ANGELA NICOLE DUKE IMPRESSIONS  1. Left ventricular ejection fraction, by estimation, is 60 to 65%. The left ventricle has normal function. The left ventricle has no regional wall motion abnormalities. Left ventricular diastolic parameters were normal.  2. Right ventricular systolic function is normal. The right ventricular size is normal. There is severely elevated pulmonary artery systolic pressure.  3. Left atrial size was mildly dilated.  4. Right atrial size was mildly dilated.  5. There is no evidence of cardiac tamponade.  6. The mitral valve is normal in structure. No evidence of mitral valve regurgitation. No evidence of mitral stenosis.  7. Tricuspid valve regurgitation is mild to moderate.  8. The aortic valve is tricuspid. There is mild calcification of the aortic valve. Aortic valve regurgitation is not visualized. Aortic valve sclerosis is present, with no evidence of aortic valve stenosis.  9. There is borderline dilatation of the ascending aorta, measuring 39 mm. 10. The inferior vena cava is dilated in size with <50% respiratory variability, suggesting right atrial pressure of 15 mmHg. Comparison(s):  Prior images reviewed side by side. RA and systolic PA pressure are higher. FINDINGS  Left Ventricle: The mitral inflow A wave is diminutive, suggesting atrial mechanical failure or stunning, but diastolic mitral annulus e' velocities are normal. Left ventricular ejection fraction, by estimation, is 60 to 65%. The left ventricle has normal function. The left ventricle has no regional wall motion abnormalities. The left ventricular internal cavity size was normal in size. There is no left ventricular hypertrophy. Left ventricular diastolic parameters were normal. Right Ventricle: The right ventricular size is normal. No increase in  right ventricular wall thickness. Right ventricular systolic function is normal. There is severely elevated pulmonary artery systolic pressure. The tricuspid regurgitant velocity is 3.38 m/s, and with an assumed right atrial pressure of 15 mmHg, the estimated right ventricular systolic pressure is 60.7 mmHg. Left [facility]: Left atrial size was mildly dilated. Right [facility]: Right atrial size was mildly dilated. Pericardium: Trivial pericardial effusion is present. There is no evidence of cardiac tamponade. Mitral Valve: The mitral valve is normal in structure. No evidence of mitral valve regurgitation. No evidence of mitral valve stenosis. Tricuspid Valve: The tricuspid valve is normal in structure. Tricuspid valve regurgitation is mild to moderate. No evidence of tricuspid stenosis. Aortic Valve: The aortic valve is tricuspid. There is mild calcification of the aortic valve. Aortic valve regurgitation is not visualized. Aortic valve sclerosis is present, with no evidence of aortic valve stenosis. Aortic valve mean gradient measures 3.0 mmHg. Aortic valve peak gradient measures 5.3 mmHg. Aortic valve area, by VTI measures 3.32 cm. Pulmonic Valve: The pulmonic valve was normal in structure. Pulmonic valve regurgitation is not visualized. No evidence of pulmonic stenosis. Aorta: The aortic root is normal in size and structure. There is borderline dilatation of the ascending aorta, measuring 39 mm. Venous: The inferior vena cava is dilated in size with less than 50% respiratory variability, suggesting right atrial pressure of 15 mmHg. IAS/Shunts: No atrial level shunt detected by color flow Doppler.  LEFT VENTRICLE PLAX 2D LVIDd:         4.90 cm   Diastology LVIDs:         2.80 cm   LV e' medial:    6.42 cm/s LV PW:         1.10 cm   LV E/e' medial:  15.0 LV IVS:        1.20 cm   LV e' lateral:   12.00 cm/s LVOT diam:     2.10 cm   LV E/e' lateral: 8.0 LV SV:         73 LV SV Index:   34 LVOT Area:     3.46 cm  RIGHT  VENTRICLE             IVC RV S prime:     17.20 cm/s  IVC diam: 2.60 cm TAPSE (M-mode): 2.4 cm LEFT ATRIUM             Index        RIGHT ATRIUM           Index LA diam:        3.70 cm 1.72 cm/m   RA Area:     23.90 cm LA Vol (A2C):   61.0 ml 28.30 ml/m  RA Volume:   85.30 ml  39.57 ml/m LA Vol (A4C):   39.7 ml 18.42 ml/m LA Biplane Vol: 55.4 ml 25.70 ml/m  AORTIC VALVE  PULMONIC VALVE AV Area (Vmax):    3.43 cm     PV Vmax:       0.99 m/s AV Area (Vmean):   3.40 cm     PV Peak grad:  3.9 mmHg AV Area (VTI):     3.32 cm AV Vmax:           115.00 cm/s AV Vmean:          74.700 cm/s AV VTI:            0.220 m AV Peak Grad:      5.3 mmHg AV Mean Grad:      3.0 mmHg LVOT Vmax:         114.00 cm/s LVOT Vmean:        73.400 cm/s LVOT VTI:          0.211 m LVOT/AV VTI ratio: 0.96  AORTA Ao Root diam: 3.40 cm Ao Asc diam:  3.90 cm MITRAL VALVE               TRICUSPID VALVE MV Area (PHT): 5.02 cm    TR Peak grad:   45.7 mmHg MV Decel Time: 151 msec    TR Vmax:        338.00 cm/s MV E velocity: 96.40 cm/s MV A velocity: 40.10 cm/s  SHUNTS MV E/A ratio:  2.40        Systemic VTI:  0.21 m                            Systemic Diam: 2.10 cm Rachelle Hora [place] Souter MD Electronically signed by Thurmon Fair MD Signature Date/Time: 07/01/2023/12:10:46 PM    Final    CT Angio Chest/Abd/Pel for Dissection W and/or Wo Contrast  Result Date: 07/01/2023 CLINICAL DATA:  Acute aortic syndrome suspected. Chest pain, ablation on Friday. EXAM: CT ANGIOGRAPHY CHEST, ABDOMEN AND PELVIS TECHNIQUE: Non-contrast CT of the chest was initially obtained. Multidetector CT imaging through the chest, abdomen and pelvis was performed using the standard protocol during bolus administration of intravenous contrast. Multiplanar reconstructed images and MIPs were obtained and reviewed to evaluate the vascular anatomy. RADIATION DOSE REDUCTION: This exam was performed according to the departmental dose-optimization program which includes  automated exposure control, adjustment of the mA and/or kV according to patient size and/or use of iterative reconstruction technique. CONTRAST:  OMNIPAQUE IOHEXOL 350 MG/ML SOLN COMPARISON:  04/11/2022. FINDINGS: CTA CHEST FINDINGS Cardiovascular: The heart is normal in size and there is a small pericardial effusion. Pacemaker leads are noted in the heart. Scattered coronary artery calcifications are present. There is atherosclerotic calcification of the aorta without evidence of aneurysm or dissection. The pulmonary trunk is mildly distended suggesting underlying pulmonary artery hypertension. Mediastinum/Nodes: No mediastinal, hilar, or axillary lymphadenopathy. The thyroid gland, trachea, and esophagus are within normal limits. Lungs/Pleura: Biapical pleural and parenchymal scarring is noted bilaterally. Mild bronchial wall thickening is noted bilaterally. Focal consolidation is noted in the left lower lobe, axial image 120. Atelectasis is present bilaterally. No effusion or pneumothorax. A 4 mm nodule is noted in the right middle lobe, axial image 97. There is a 4 mm nodule in the right middle lobe, axial image 110. Musculoskeletal: Degenerative changes are present in the thoracic spine. No acute or suspicious osseous abnormality. A pacemaker device is noted in the anterior chest wall on the left. Review of the MIP images confirms the above findings. CTA ABDOMEN AND PELVIS FINDINGS VASCULAR Aorta: Normal  caliber aorta without aneurysm, dissection, vasculitis or significant stenosis. Aortic atherosclerosis. Celiac: Patent without evidence of aneurysm, dissection, vasculitis or significant stenosis. SMA: Patent without evidence of aneurysm, dissection, vasculitis or significant stenosis. Renals: Both renal arteries are patent without evidence of aneurysm, dissection, vasculitis, fibromuscular dysplasia or significant stenosis. IMA: Patent without evidence of aneurysm, dissection, vasculitis or significant  stenosis. Inflow: Patent without evidence of aneurysm, dissection, vasculitis or significant stenosis. Veins: No obvious venous abnormality within the limitations of this arterial phase study. Review of the MIP images confirms the above findings. NON-VASCULAR Hepatobiliary: No focal liver abnormality is seen. Status post cholecystectomy. No biliary dilatation. Pancreas: Unremarkable. No pancreatic ductal dilatation or surrounding inflammatory changes. Spleen: Normal in size without focal abnormality. Adrenals/Urinary Tract: The adrenal glands are within normal limits. The kidneys enhance symmetrically. Cysts are present in the kidneys bilaterally. Nonobstructive renal calculi are noted on the left. No hydronephrosis bilaterally. The bladder is not seen due to streak artifact. Stomach/Bowel: Stomach is within normal limits. Appendix appears normal. No evidence of bowel wall thickening, distention, or inflammatory changes. No free air or pneumatosis. Scattered diverticula are present along the colon without evidence of diverticulitis. Lymphatic: No abdominal or pelvic lymphadenopathy. Reproductive: Prostate gland is not seen due to hardware artifact. A penile prosthesis is noted with reservoir in the low anterior pelvis. Other: No abdominopelvic ascites. Musculoskeletal: Degenerative changes are present in the lumbar spine. Spinal fusion hardware is present from L3-L5. Total hip arthroplasty changes are present bilaterally. No acute osseous abnormality. Review of the MIP images confirms the above findings. IMPRESSION: 1. No evidence of aortic aneurysm or dissection. 2. Focal consolidation in the right lower lobe, possible pneumonia. Follow-up is recommended until resolution. 3. Small pericardial effusion with coronary artery calcifications. 4. 4 mm right lower lobe pulmonary nodules. No follow-up needed if patient is low-risk (and has no known or suspected primary neoplasm). Non-contrast chest CT can be considered in  12 months if patient is high-risk. This recommendation follows the consensus statement: Guidelines for Management of Incidental Pulmonary Nodules Detected on CT Images: From the Fleischner Society 2017; Radiology 2017; 284:228-243. 5. Nonobstructive left renal calculi. Electronically Signed   By: Thornell Sartorius M.D.   On: 07/01/2023 03:47   DG Chest 2 View  Result Date: 07/01/2023 CLINICAL DATA:  Chest pain. EXAM: CHEST - 2 VIEW COMPARISON:  05/30/2022. FINDINGS: The heart is enlarged and the mediastinal contour is within normal limits. Hyperinflation of the lungs is noted. Patchy airspace disease is noted at the lung bases bilaterally, greater on the left than on the right. No effusion or pneumothorax. Apical pleural thickening is noted bilaterally. A dual lead pacemaker is present over the left chest. No acute osseous abnormality. IMPRESSION: 1. Patchy airspace disease at the lung bases, suspicious for developing pneumonia. 2. Cardiomegaly. Electronically Signed   By: Thornell Sartorius M.D.   On: 07/01/2023 02:47     Assessment and Plan:   Postprocedural acute pericarditis: Opiates provide pain relief, but also lead to severe nausea and vomiting.  I have given him 1 dose of Toradol and he has started oral colchicine.  Echo does not show a large pericardial effusion, on the contrary he probably has a physiological amount of fluid.  Will continue Eliquis. A-fib: Status post redo ablation.  Currently in sinus rhythm.  Anticoagulated. SSS: Has a dual-chamber permanent pacemaker that was functioning normally at last interrogation 06/08/2023.   Risk Assessment/Risk Scores:         CHA2DS2-VASc Score =  2   This indicates a 2.2% annual risk of stroke. The patient's score is based upon: CHF History: 0 HTN History: 0 Diabetes History: 0 Stroke History: 0 Vascular Disease History: 0 Age Score: 2 Gender Score: 0     Code Status: Full Code  Severity of Illness: The appropriate patient status for  this patient is OBSERVATION. Observation status is judged to be reasonable and necessary in order to provide the required intensity of service to ensure the patient's safety. The patient's presenting symptoms, physical exam findings, and initial radiographic and laboratory data in the context of their medical condition is felt to place them at decreased risk for further clinical deterioration. Furthermore, it is anticipated that the patient will be medically stable for discharge from the hospital within 2 midnights of admission.    For questions or updates, please contact [facility] Please consult [email or web address] for contact info under     Signed, Thurmon Fair, MD  07/01/2023 6:10 PM

## 2023-07-01 NOTE — ED Notes (Addendum)
Documented in error.

## 2023-07-01 NOTE — ED Notes (Signed)
Messaged Dr Clayborne Dana about pain and nausea was given orders for dilaudid and compazine.

## 2023-07-01 NOTE — ED Triage Notes (Signed)
Patient bib [facility] from home. Patient has been having worsening chest pain since 6pm. Patient states its a 9/10 pain and is left chest and up to his jaw. EMS reports patient had a ablation here at [facility] on Thursday for a-fib and the EKG enroute was unremarkable. He was given of fentanyl 324mg  aspirin. Patient takes eliquis for his a-fib.

## 2023-07-01 NOTE — ED Notes (Signed)
ED TO INPATIENT HANDOFF REPORT  ED Nurse Name and Phone #:  Shadee Rathod [phone]  S Name/Age/Gender Elnoria Howard 87 y.o. male Room/Bed: 041C/041C  Code Status   Code Status: Full Code  Home/SNF/Other Home Patient oriented to: self, place, time, and situation Is this baseline? Yes   Triage Complete: Triage complete  Chief Complaint Pericarditis [I31.9]  Triage Note Patient bib [facility] from home. Patient has been having worsening chest pain since 6pm. Patient states its a 9/10 pain and is left chest and up to his jaw. EMS reports patient had a ablation here at [facility] on Thursday for a-fib and the EKG enroute was unremarkable. He was given of fentanyl 324mg  aspirin. Patient takes eliquis for his a-fib.     Allergies Allergies  Allergen Reactions   Ciprofloxacin Anaphylaxis and Other (See Comments)    Dizziness, also   Tramadol Swelling and Other (See Comments)    Laryngeal edema   Duloxetine Other (See Comments)    Delusions    Hydrocodone-Acetaminophen Nausea And Vomiting   Nisoldipine Other (See Comments)    Pt unsure of reaction    Benadryl [Diphenhydramine] Anxiety and Other (See Comments)    Panic attack    Methocarbamol Nausea Only   Ondansetron Hcl Nausea Only   Sulfa Antibiotics Rash    Level of Care/Admitting Diagnosis ED Disposition     ED Disposition  Admit   Condition  --   Comment  Hospital Area: [facility] [[id number]]  Level of Care: Telemetry Cardiac [[id number]]  May admit patient to [facility] or [facility] if equivalent level of care is available:: No  Covid Evaluation: Asymptomatic - no recent exposure (last 10 days) testing not required  Diagnosis: Pericarditis [203047]  Admitting Physician: Thurmon Fair [[id number]]  Attending Physician: Thurmon Fair [[id number]]  Certification:: I certify this patient will need inpatient services for at least 2 midnights  Estimated Length of Stay: 2          B Medical/Surgery History Past  Medical History:  Diagnosis Date   Arthritis    Atrial fibrillation (HCC)    Cancer (HCC)    prostate   Dysrhythmia    afib   History of kidney stones    Past Surgical History:  Procedure Laterality Date   ATRIAL FIBRILLATION ABLATION N/A 10/29/2021   Procedure: ATRIAL FIBRILLATION ABLATION;  Surgeon: Regan Lemming, MD;  Location: [facility] INVASIVE CV LAB;  Service: Cardiovascular;  Laterality: N/A;   Bone spur     CARDIOVERSION N/A 06/01/2022   Procedure: CARDIOVERSION;  Surgeon: Little Ishikawa, MD;  Location: [facility] ENDOSCOPY;  Service: Cardiovascular;  Laterality: N/A;   CHOLECYSTECTOMY     HERNIA REPAIR     JOINT REPLACEMENT     kidney stones     LAMINECTOMY WITH POSTERIOR LATERAL ARTHRODESIS LEVEL 1 Right 11/25/2019   Procedure: Laminectomy and Foraminotomy  Lumbar Four-Lumbar Five - right, with instrumented fusion;  Surgeon: Tia Alert, MD;  Location: [facility];  Service: Neurosurgery;  Laterality: Right;  Laminectomy and Foraminotomy  Lumbar Four-Lumbar Five - right, with instrumented fusion   PROSTATE SURGERY     TEE WITHOUT CARDIOVERSION N/A 06/01/2022   Procedure: TRANSESOPHAGEAL ECHOCARDIOGRAM (TEE);  Surgeon: Little Ishikawa, MD;  Location: [facility] ENDOSCOPY;  Service: Cardiovascular;  Laterality: N/A;     A IV Location/Drains/Wounds Patient Lines/Drains/Airways Status     Active Line/Drains/Airways     Name Placement date Placement time Site Days   Peripheral IV 07/01/23 18  G Anterior;Distal;Right;Upper Arm 07/01/23  0110  Arm  less than 1            Intake/Output Last 24 hours No intake or output data in the 24 hours ending 07/01/23 1544  Labs/Imaging Results for orders placed or performed during the hospital encounter of 07/01/23 (from the past 48 hour(s))  Basic metabolic panel     Status: Abnormal   Collection Time: 07/01/23  1:30 AM  Result Value Ref Range   Sodium 140 135 - 145 mmol/L   Potassium 4.0 3.5 - 5.1 mmol/L   Chloride 109 98 -  111 mmol/L   CO2 22 22 - 32 mmol/L   Glucose, Bld 115 (H) 70 - 99 mg/dL    Comment: Glucose reference range applies only to samples taken after fasting for at least 8 hours.   BUN 19 8 - 23 mg/dL   Creatinine, Ser 0.10 (H) 0.61 - 1.24 mg/dL   Calcium 8.8 (L) 8.9 - 10.3 mg/dL   GFR, Estimated 55 (L) >60 mL/min    Comment: (NOTE) Calculated using the CKD-EPI Creatinine Equation (2021)    Anion gap 9 5 - 15    Comment: Performed at [facility], [address], [place], [place] [address]  CBC     Status: Abnormal   Collection Time: 07/01/23  1:30 AM  Result Value Ref Range   WBC 12.6 (H) 4.0 - 10.5 K/uL   RBC 2.55 (L) 4.22 - 5.81 MIL/uL   Hemoglobin 8.5 (L) 13.0 - 17.0 g/dL   HCT 66.4 (L) 40.3 - 47.4 %   MCV 103.1 (H) 80.0 - 100.0 fL   MCH 33.3 26.0 - 34.0 pg   MCHC 32.3 30.0 - 36.0 g/dL   RDW 25.9 (H) 56.3 - 87.5 %   Platelets 270 150 - 400 K/uL   nRBC 0.5 (H) 0.0 - 0.2 %    Comment: Performed at [facility], [address] [place], [place] [address]  Troponin I (High Sensitivity)     Status: Abnormal   Collection Time: 07/01/23  1:30 AM  Result Value Ref Range   Troponin I (High Sensitivity) 556 (HH) <18 ng/L    Comment: CRITICAL RESULT CALLED TO, READ BACK BY AND VERIFIED WITH ROSS, K. RN @ [phone] 07/01/23 JBUTLER (NOTE) Elevated high sensitivity troponin I (hsTnI) values and significant  changes across serial measurements may suggest ACS but many other  chronic and acute conditions are known to elevate hsTnI results.  Refer to the "Links" section for chest pain algorithms and additional  guidance. Performed at [facility], [address], [place], [place] [address]   Protime-INR (order if Patient is taking Coumadin / Warfarin)     Status: Abnormal   Collection Time: 07/01/23  1:30 AM  Result Value Ref Range   Prothrombin Time 17.5 (H) 11.4 - 15.2 seconds   INR 1.4 (H) 0.8 - 1.2    Comment: (NOTE) INR goal varies based on device and disease  states. Performed at [facility], [address] [place], [place] [address]   Hepatic function panel     Status: Abnormal   Collection Time: 07/01/23  1:30 AM  Result Value Ref Range   Total Protein 6.4 (L) 6.5 - 8.1 g/dL   Albumin 3.9 3.5 - 5.0 g/dL   AST 20 15 - 41 U/L   ALT 12 0 - 44 U/L   Alkaline Phosphatase 69 38 - 126 U/L  Total Bilirubin 1.4 (H) 0.3 - 1.2 mg/dL   Bilirubin, Direct 0.3 (H) 0.0 - 0.2 mg/dL   Indirect Bilirubin 1.1 (H) 0.3 - 0.9 mg/dL    Comment: Performed at [facility], [address], [place], [place] [address]  Lipase, blood     Status: None   Collection Time: 07/01/23  1:30 AM  Result Value Ref Range   Lipase 29 11 - 51 U/L    Comment: Performed at [facility], [address], [place], [place] [address]  Troponin I (High Sensitivity)     Status: Abnormal   Collection Time: 07/01/23  3:45 AM  Result Value Ref Range   Troponin I (High Sensitivity) 505 (HH) <18 ng/L    Comment: CRITICAL VALUE NOTED. VALUE IS CONSISTENT WITH PREVIOUSLY REPORTED/CALLED VALUE (NOTE) Elevated high sensitivity troponin I (hsTnI) values and significant  changes across serial measurements may suggest ACS but many other  chronic and acute conditions are known to elevate hsTnI results.  Refer to the "Links" section for chest pain algorithms and additional  guidance. Performed at [facility], [address] [place], [place] [address]   Sedimentation rate     Status: None   Collection Time: 07/01/23  8:01 AM  Result Value Ref Range   Sed Rate 3 0 - 16 mm/hr    Comment: Performed at [facility], [address], [place], [place] [address]  C-reactive protein     Status: Abnormal   Collection Time: 07/01/23  8:01 AM  Result Value Ref Range   CRP 2.8 (H) <1.0 mg/dL    Comment: Performed at [facility], [address], [place], [place] [address]   ECHOCARDIOGRAM COMPLETE  Result Date: 07/01/2023    ECHOCARDIOGRAM REPORT   Patient  Name:   MYLON MABEY Date of Exam: 07/01/2023 Medical Rec #:  [id number]    Height:       75.0 in Accession #:    [id number]   Weight:       191.8 lb Date of Birth:  07-11-1936    BSA:          2.156 m Patient Age:    87 years     BP:           107/60 mmHg Patient Gender: M            HR:           81 bpm. Exam Location:  Inpatient Procedure: 2D Echo, Cardiac Doppler and Color Doppler Indications:    Pericardial effusion I31.3  History:        Patient has prior history of Echocardiogram examinations, most                 recent 06/01/2022. CAD, Pacemaker, CKD3, Signs/Symptoms:Chest                 Pain; Risk Factors:Hypertension and Former Smoker.  Sonographer:    Dondra Prader RVT RCS Referring Phys: [id number] ANGELA NICOLE [facility] IMPRESSIONS  1. Left ventricular ejection fraction, by estimation, is 60 to 65%. The left ventricle has normal function. The left ventricle has no regional wall motion abnormalities. Left ventricular diastolic parameters were normal.  2. Right ventricular systolic function is normal. The right ventricular size is normal. There is severely elevated pulmonary artery systolic pressure.  3. Left atrial size was mildly dilated.  4. Right atrial size was mildly dilated.  5. There is no evidence of cardiac  tamponade.  6. The mitral valve is normal in structure. No evidence of mitral valve regurgitation. No evidence of mitral stenosis.  7. Tricuspid valve regurgitation is mild to moderate.  8. The aortic valve is tricuspid. There is mild calcification of the aortic valve. Aortic valve regurgitation is not visualized. Aortic valve sclerosis is present, with no evidence of aortic valve stenosis.  9. There is borderline dilatation of the ascending aorta, measuring 39 mm. 10. The inferior vena cava is dilated in size with <50% respiratory variability, suggesting right atrial pressure of 15 mmHg. Comparison(s): Prior images reviewed side by side. RA and systolic PA pressure are higher. FINDINGS  Left  Ventricle: The mitral inflow A wave is diminutive, suggesting atrial mechanical failure or stunning, but diastolic mitral annulus e' velocities are normal. Left ventricular ejection fraction, by estimation, is 60 to 65%. The left ventricle has normal function. The left ventricle has no regional wall motion abnormalities. The left ventricular internal cavity size was normal in size. There is no left ventricular hypertrophy. Left ventricular diastolic parameters were normal. Right Ventricle: The right ventricular size is normal. No increase in right ventricular wall thickness. Right ventricular systolic function is normal. There is severely elevated pulmonary artery systolic pressure. The tricuspid regurgitant velocity is 3.38 m/s, and with an assumed right atrial pressure of 15 mmHg, the estimated right ventricular systolic pressure is 60.7 mmHg. Left Atrium: Left atrial size was mildly dilated. Right Atrium: Right atrial size was mildly dilated. Pericardium: Trivial pericardial effusion is present. There is no evidence of cardiac tamponade. Mitral Valve: The mitral valve is normal in structure. No evidence of mitral valve regurgitation. No evidence of mitral valve stenosis. Tricuspid Valve: The tricuspid valve is normal in structure. Tricuspid valve regurgitation is mild to moderate. No evidence of tricuspid stenosis. Aortic Valve: The aortic valve is tricuspid. There is mild calcification of the aortic valve. Aortic valve regurgitation is not visualized. Aortic valve sclerosis is present, with no evidence of aortic valve stenosis. Aortic valve mean gradient measures 3.0 mmHg. Aortic valve peak gradient measures 5.3 mmHg. Aortic valve area, by VTI measures 3.32 cm. Pulmonic Valve: The pulmonic valve was normal in structure. Pulmonic valve regurgitation is not visualized. No evidence of pulmonic stenosis. Aorta: The aortic root is normal in size and structure. There is borderline dilatation of the ascending aorta,  measuring 39 mm. Venous: The inferior vena cava is dilated in size with less than 50% respiratory variability, suggesting right atrial pressure of 15 mmHg. IAS/Shunts: No atrial level shunt detected by color flow Doppler.  LEFT VENTRICLE PLAX 2D LVIDd:         4.90 cm   Diastology LVIDs:         2.80 cm   LV e' medial:    6.42 cm/s LV PW:         1.10 cm   LV E/e' medial:  15.0 LV IVS:        1.20 cm   LV e' lateral:   12.00 cm/s LVOT diam:     2.10 cm   LV E/e' lateral: 8.0 LV SV:         73 LV SV Index:   34 LVOT Area:     3.46 cm  RIGHT VENTRICLE             IVC RV S prime:     17.20 cm/s  IVC diam: 2.60 cm TAPSE (M-mode): 2.4 cm LEFT ATRIUM  Index        RIGHT ATRIUM           Index LA diam:        3.70 cm 1.72 cm/m   RA Area:     23.90 cm LA Vol (A2C):   61.0 ml 28.30 ml/m  RA Volume:   85.30 ml  39.57 ml/m LA Vol (A4C):   39.7 ml 18.42 ml/m LA Biplane Vol: 55.4 ml 25.70 ml/m  AORTIC VALVE                    PULMONIC VALVE AV Area (Vmax):    3.43 cm     PV Vmax:       0.99 m/s AV Area (Vmean):   3.40 cm     PV Peak grad:  3.9 mmHg AV Area (VTI):     3.32 cm AV Vmax:           115.00 cm/s AV Vmean:          74.700 cm/s AV VTI:            0.220 m AV Peak Grad:      5.3 mmHg AV Mean Grad:      3.0 mmHg LVOT Vmax:         114.00 cm/s LVOT Vmean:        73.400 cm/s LVOT VTI:          0.211 m LVOT/AV VTI ratio: 0.96  AORTA Ao Root diam: 3.40 cm Ao Asc diam:  3.90 cm MITRAL VALVE               TRICUSPID VALVE MV Area (PHT): 5.02 cm    TR Peak grad:   45.7 mmHg MV Decel Time: 151 msec    TR Vmax:        338.00 cm/s MV E velocity: 96.40 cm/s MV A velocity: 40.10 cm/s  SHUNTS MV E/A ratio:  2.40        Systemic VTI:  0.21 m                            Systemic Diam: 2.10 cm Rachelle Hora Croitoru MD Electronically signed by Thurmon Fair MD Signature Date/Time: 07/01/2023/12:10:46 PM    Final    CT Angio Chest/Abd/Pel for Dissection W and/or Wo Contrast  Result Date: 07/01/2023 CLINICAL DATA:  Acute  aortic syndrome suspected. Chest pain, ablation on Friday. EXAM: CT ANGIOGRAPHY CHEST, ABDOMEN AND PELVIS TECHNIQUE: Non-contrast CT of the chest was initially obtained. Multidetector CT imaging through the chest, abdomen and pelvis was performed using the standard protocol during bolus administration of intravenous contrast. Multiplanar reconstructed images and MIPs were obtained and reviewed to evaluate the vascular anatomy. RADIATION DOSE REDUCTION: This exam was performed according to the departmental dose-optimization program which includes automated exposure control, adjustment of the mA and/or kV according to patient size and/or use of iterative reconstruction technique. CONTRAST:  OMNIPAQUE IOHEXOL 350 MG/ML SOLN COMPARISON:  04/11/2022. FINDINGS: CTA CHEST FINDINGS Cardiovascular: The heart is normal in size and there is a small pericardial effusion. Pacemaker leads are noted in the heart. Scattered coronary artery calcifications are present. There is atherosclerotic calcification of the aorta without evidence of aneurysm or dissection. The pulmonary trunk is mildly distended suggesting underlying pulmonary artery hypertension. Mediastinum/Nodes: No mediastinal, hilar, or axillary lymphadenopathy. The thyroid gland, trachea, and esophagus are within normal limits. Lungs/Pleura: Biapical pleural and parenchymal scarring is noted  bilaterally. Mild bronchial wall thickening is noted bilaterally. Focal consolidation is noted in the left lower lobe, axial image 120. Atelectasis is present bilaterally. No effusion or pneumothorax. A 4 mm nodule is noted in the right middle lobe, axial image 97. There is a 4 mm nodule in the right middle lobe, axial image 110. Musculoskeletal: Degenerative changes are present in the thoracic spine. No acute or suspicious osseous abnormality. A pacemaker device is noted in the anterior chest wall on the left. Review of the MIP images confirms the above findings. CTA ABDOMEN  AND PELVIS FINDINGS VASCULAR Aorta: Normal caliber aorta without aneurysm, dissection, vasculitis or significant stenosis. Aortic atherosclerosis. Celiac: Patent without evidence of aneurysm, dissection, vasculitis or significant stenosis. SMA: Patent without evidence of aneurysm, dissection, vasculitis or significant stenosis. Renals: Both renal arteries are patent without evidence of aneurysm, dissection, vasculitis, fibromuscular dysplasia or significant stenosis. IMA: Patent without evidence of aneurysm, dissection, vasculitis or significant stenosis. Inflow: Patent without evidence of aneurysm, dissection, vasculitis or significant stenosis. Veins: No obvious venous abnormality within the limitations of this arterial phase study. Review of the MIP images confirms the above findings. NON-VASCULAR Hepatobiliary: No focal liver abnormality is seen. Status post cholecystectomy. No biliary dilatation. Pancreas: Unremarkable. No pancreatic ductal dilatation or surrounding inflammatory changes. Spleen: Normal in size without focal abnormality. Adrenals/Urinary Tract: The adrenal glands are within normal limits. The kidneys enhance symmetrically. Cysts are present in the kidneys bilaterally. Nonobstructive renal calculi are noted on the left. No hydronephrosis bilaterally. The bladder is not seen due to streak artifact. Stomach/Bowel: Stomach is within normal limits. Appendix appears normal. No evidence of bowel wall thickening, distention, or inflammatory changes. No free air or pneumatosis. Scattered diverticula are present along the colon without evidence of diverticulitis. Lymphatic: No abdominal or pelvic lymphadenopathy. Reproductive: Prostate gland is not seen due to hardware artifact. A penile prosthesis is noted with reservoir in the low anterior pelvis. Other: No abdominopelvic ascites. Musculoskeletal: Degenerative changes are present in the lumbar spine. Spinal fusion hardware is present from L3-L5. Total  hip arthroplasty changes are present bilaterally. No acute osseous abnormality. Review of the MIP images confirms the above findings. IMPRESSION: 1. No evidence of aortic aneurysm or dissection. 2. Focal consolidation in the right lower lobe, possible pneumonia. Follow-up is recommended until resolution. 3. Small pericardial effusion with coronary artery calcifications. 4. 4 mm right lower lobe pulmonary nodules. No follow-up needed if patient is low-risk (and has no known or suspected primary neoplasm). Non-contrast chest CT can be considered in 12 months if patient is high-risk. This recommendation follows the consensus statement: Guidelines for Management of Incidental Pulmonary Nodules Detected on CT Images: From the Fleischner Society 2017; Radiology 2017; 284:228-243. 5. Nonobstructive left renal calculi. Electronically Signed   By: Thornell Sartorius M.D.   On: 07/01/2023 03:47   DG Chest 2 View  Result Date: 07/01/2023 CLINICAL DATA:  Chest pain. EXAM: CHEST - 2 VIEW COMPARISON:  05/30/2022. FINDINGS: The heart is enlarged and the mediastinal contour is within normal limits. Hyperinflation of the lungs is noted. Patchy airspace disease is noted at the lung bases bilaterally, greater on the left than on the right. No effusion or pneumothorax. Apical pleural thickening is noted bilaterally. A dual lead pacemaker is present over the left chest. No acute osseous abnormality. IMPRESSION: 1. Patchy airspace disease at the lung bases, suspicious for developing pneumonia. 2. Cardiomegaly. Electronically Signed   By: Thornell Sartorius M.D.   On: 07/01/2023 02:47    Pending  Labs [employer] (From admission, onward)    None       Vitals/Pain Today's Vitals   07/01/23 1445 07/01/23 1500 07/01/23 1515 07/01/23 1530  BP: (!) 108/58 (!) 111/57 (!) 105/58 (!) 108/59  Pulse: 77 78 73 74  Resp: 17 13 14 14   Temp:      TempSrc:      SpO2: 92% 93% 94% 94%  Weight:      Height:      PainSc:         Isolation Precautions No active isolations  Medications Medications  nitroGLYCERIN (NITROSTAT) SL tablet 0.4 mg (has no administration in time range)  acetaminophen (TYLENOL) tablet 650 mg (has no administration in time range)  ondansetron (ZOFRAN) injection 4 mg (4 mg Intravenous Given 07/01/23 1202)  apixaban (ELIQUIS) tablet 5 mg (has no administration in time range)  colchicine tablet 0.6 mg (has no administration in time range)  oxyCODONE-acetaminophen (PERCOCET/ROXICET) 5-325 MG per tablet 1-2 tablet (has no administration in time range)  furosemide (LASIX) injection 20 mg (20 mg Intravenous Given 07/01/23 1259)  HYDROmorphone (DILAUDID) injection 1 mg (1 mg Intravenous Given 07/01/23 0205)  iohexol (OMNIPAQUE) 350 MG/ML injection 100 mL (100 mLs Intravenous Contrast Given 07/01/23 0320)  lactated ringers bolus 1,000 mL (0 mLs Intravenous Stopped 07/01/23 0626)  prochlorperazine (COMPAZINE) injection 10 mg (10 mg Intravenous Given 07/01/23 0538)  doxycycline (VIBRAMYCIN) 100 mg in sodium chloride 0.9 % 250 mL IVPB (0 mg Intravenous Stopped 07/01/23 0916)  HYDROmorphone (DILAUDID) injection 1 mg (1 mg Intravenous Given 07/01/23 0748)  colchicine tablet 0.6 mg (0.6 mg Oral Given 07/01/23 0752)  metoCLOPramide (REGLAN) injection 10 mg (10 mg Intravenous Given 07/01/23 0814)  ketorolac (TORADOL) 30 MG/ML injection 30 mg (30 mg Intravenous Given 07/01/23 1259)    Mobility walks     Focused Assessments Cardiac Assessment Handoff:  Cardiac Rhythm: Normal sinus rhythm Lab Results  Component Value Date   CKTOTAL 41 12/11/2011   CKMB 2.3 12/11/2011   TROPONINI <0.03 02/26/2017   Lab Results  Component Value Date   DDIMER 0.23 12/10/2011   Does the Patient currently have chest pain? Yes    R Recommendations: See Admitting Provider Note  Report given to:   Additional Notes:

## 2023-07-01 NOTE — H&P (Signed)
Cardiology Admission History and Physical    Patient ID: Todd Salazar MRN: [id number]; DOB: 08-Mar-1936    Admission date: 07/01/2023   PCP:  Patient, No Pcp Per              [facility] Providers Cardiologist:  None  Electrophysiologist:  Will [name], MD         Chief Complaint:  chest pain   Patient Profile:    Todd Salazar is a 87 y.o. male with history of atrial fibrillation status post recent ablation who is being seen 07/01/2023 for the evaluation of pleuritic chest pain.   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    He had a previous atrial fibrillation ablation 10/29/2021, then had a redo ablation procedure 06/29/2023.  Note that that previous ablation procedure was complicated by pericarditis and resolved with treatment with colchicine.   Previously failed treatment with sotalol, flecainide, propafenone and was poorly tolerant of AV nodal blocking agents.  Was treated with dofetilide in September 2022 which actually led to spontaneous conversion to sinus rhythm, but he subsequently had antiarrhythmic failure with recurrent A-fib with RVR.  He was treated with amiodarone in September 2022 but this was subsequently discontinued after he had pulmonary vein isolation.  He was last hospitalized for atrial fibrillation rapid ventricular sponsor and underwent cardioversion 06/01/2022.   Starting at 6 PM yesterday, roughly 24 hours after his ablation procedure he began experiencing worsening chest pain which is described as worsened by laying supine or taking deep breaths.  It is somewhat improved by sitting up.  The pain escalated in severity  reaching 9/10 and he came to the emergency room.  He has obtained relief with Dilaudid, but each dose causes nausea and vomiting.  Has a history of nausea and vomiting with hydrocodone as well.  He has a history of laryngeal edema with tramadol.  He denies shortness of breath, dizziness, syncope or palpitations.  Right now he is fairly comfortable when sitting up and not taking a deep breath.   His ECG shows diffuse ST segment elevation, upwardly convex, evident in most leads with the exception of V1-V2 and aVR.  There is evidence of PR depression most obvious in leads II.  He has moderately elevated high-sensitivity troponin around 500, with a flat profile.  His C-reactive protein is elevated, but interestingly sedimentation rate is normal.   CT was reported as showing a small pericardial effusion, but by echocardiographic evaluation this is trivial, even physiological.  He does have evidence of increased right atrial pressures and elevated PA pressure.  Hard to estimate left atrial filling pressures due to what appears to be atrial stunning with diminutive A waves. E/e' is borderline at 12-15.         Past Medical History:  Diagnosis Date   Arthritis     Atrial fibrillation (HCC)     Cancer (HCC)      prostate   Dysrhythmia      afib   History of kidney stones                 Past Surgical History:  Procedure Laterality Date   ATRIAL FIBRILLATION ABLATION N/A 10/29/2021    Procedure: ATRIAL FIBRILLATION  ABLATION;  Surgeon: Regan Lemming, MD;  Location: [facility] INVASIVE CV LAB;  Service: Cardiovascular;  Laterality: N/A;   Bone spur       CARDIOVERSION N/A 06/01/2022    Procedure: CARDIOVERSION;  Surgeon: Little Ishikawa, MD;  Location: [facility] ENDOSCOPY;  Service: Cardiovascular;  Laterality: N/A;   CHOLECYSTECTOMY       HERNIA REPAIR       JOINT REPLACEMENT       kidney stones       LAMINECTOMY WITH POSTERIOR LATERAL ARTHRODESIS LEVEL 1 Right 11/25/2019    Procedure:  Laminectomy and Foraminotomy  Lumbar Four-Lumbar Five - right, with instrumented fusion;  Surgeon: Tia Alert, MD;  Location: [facility] OR;  Service: Neurosurgery;  Laterality: Right;  Laminectomy and Foraminotomy  Lumbar Four-Lumbar Five - right, with instrumented fusion   PROSTATE SURGERY       TEE WITHOUT CARDIOVERSION N/A 06/01/2022    Procedure: TRANSESOPHAGEAL ECHOCARDIOGRAM (TEE);  Surgeon: Little Ishikawa, MD;  Location: [facility] ENDOSCOPY;  Service: Cardiovascular;  Laterality: N/A;          Medications Prior to Admission:        Prior to Admission medications   Medication Sig Start Date End Date Taking? Authorizing Provider  acetaminophen (TYLENOL) 500 MG tablet Take 1,000 mg by mouth as needed for moderate pain.     Yes [provider]  Calcium Carbonate Antacid (TUMS PO) Take 4 tablets by mouth as needed (discomfort).     Yes [provider]  ELIQUIS 5 MG TABS tablet TAKE 1 TABLET BY MOUTH 2 TIMES A DAY 04/11/23   Yes Camnitz, Will Daphine Deutscher, MD  gabapentin (NEURONTIN) 100 MG capsule Take 100 mg by mouth See admin instructions. Take with 300 mg for a total of 400 mg at bedtime 01/24/22   Yes [provider]  gabapentin (NEURONTIN) 300 MG capsule Take 300 mg by mouth See admin instructions. Take with 100 mg for a total of 400 mg at bedtime     Yes [provider]  nitroGLYCERIN (NITROSTAT) 0.4 MG SL tablet Place 0.4 mg under the tongue every 5 (five) minutes as needed for chest pain. 03/18/22   Yes [provider]  amiodarone (PACERONE) 200 MG tablet Take 1 tablet (200 mg total) TWICE a day for one month, then reduce and take 1 tablet ONCE a day thereafter Patient not taking: Reported on 06/26/2023 03/20/23     Regan Lemming, MD  diltiazem (CARDIZEM CD) 180 MG 24 hr capsule Take 1 capsule (180 mg total) by mouth daily. Patient not taking: Reported on 06/26/2023 04/14/23     Regan Lemming, MD  famotidine (PEPCID) 20 MG tablet Take 20 mg by  mouth 2 (two) times daily. Patient not taking: Reported on 07/01/2023 03/20/23     [provider]      Allergies:    Allergies       Allergies  Allergen Reactions   Ciprofloxacin Anaphylaxis and Other (See Comments)      Dizziness, also   Tramadol Swelling and Other (See Comments)      Laryngeal edema   Duloxetine Other (See Comments)      Delusions     Hydrocodone-Acetaminophen Nausea And Vomiting   Nisoldipine Other (See Comments)      Pt unsure of reaction    Benadryl [Diphenhydramine] Anxiety and Other (See Comments)      Panic attack    Methocarbamol Nausea Only   Ondansetron Hcl Nausea Only  Sulfa Antibiotics Rash        Social History:   Social History         Socioeconomic History   Marital status: Widowed      Spouse name: Not on file   Number of children: Not on file   Years of education: Not on file   Highest education level: Not on file  Occupational History   Not on file  Tobacco Use   Smoking status: Former      Current packs/day: 0.00      Types: Cigarettes      Quit date: 12/10/1996      Years since quitting: 26.5   Smokeless tobacco: Never   Tobacco comments:      Former smoker (08/27/2021)  Vaping Use   Vaping status: Never Used  Substance and Sexual Activity   Alcohol use: No   Drug use: No   Sexual activity: Never  Other Topics Concern   Not on file  Social History Narrative   Not on file    Social Determinants of Health        Financial Resource Strain: Not on file  Food Insecurity: Low Risk  (03/18/2023)    [facility]    Food vital sign     Within the past 12 months, you worried that your food would run out before you got money to buy more: Never true     Within the past 12 months, the food you bought just didn't last and you didn't have money to get more. : Never true  Transportation Needs: No Transportation Needs (03/18/2023)    [facility]    Transportation      In the past 12 months, has lack of reliable transportation kept you from medical appointments, meetings, work or from getting things needed for daily living? : No  Physical Activity: Not on file  Stress: Not on file  Social Connections: Not on file  Intimate Partner Violence: Not on file    Family History:   The patient's family history includes Hypertension in his father. There is no history of CAD, Heart failure, or Stroke.     ROS:  Please see the history of present illness.  All other ROS reviewed and negative.      Physical Exam/Data:          Vitals:    07/01/23 1515 07/01/23 1530 07/01/23 1545 07/01/23 1627  BP: (!) 105/58 (!) 108/59 114/69 123/72  Pulse: 73 74 88 79  Resp: 14 14 20     Temp:       97.7 F (36.5 C)  TempSrc:       Oral  SpO2: 94% 94%   92%  Weight:          Height:            No intake or output data in the 24 hours ending 07/01/23 1810     07/01/2023    1:37 AM 06/29/2023   10:56 AM 03/20/2023    9:47 AM  Last 3 Weights  Weight (lbs) 191 lb 12.8 oz 190 lb 194 lb  Weight (kg) 87 kg 86.183 kg 87.998 kg     Body mass index is 23.97 kg/m.  General:  Well nourished, well developed, in no acute distress.  Left subclavian pacemaker site appears healthy HEENT: normal Neck: no JVD Vascular: No carotid bruits; Distal pulses 2+ bilaterally   Cardiac:  normal S1, S2; RRR;  no murmur .  A 2 component pericardial rub is heard. Lungs:  clear to auscultation bilaterally, no wheezing, rhonchi or rales  Abd: soft, nontender, no hepatomegaly  Ext: no edema, no groin hematoma Musculoskeletal:  No deformities, BUE and BLE strength normal and equal Skin: warm and dry  Neuro:  CNs 2-12 intact, no focal abnormalities noted Psych:  Normal affect      EKG:  The ECG that was done today was personally reviewed and demonstrates sinus rhythm with diffuse ST segment elevation consistent with acute pericarditis   Relevant CV Studies:  TTE 03/17/23    There is mild  concentric left ventricular hypertrophy with normal wall motion, normal systolic  function and ejection fraction  55-60% .  Device lead in the right ventricle  There is mild tricuspid regurgitation.  Estimated right ventricular systolic pressure is 49 mmHg.  Moderate pulmonary hypertension.  Probably no significant change in comparison with the prior study noted    Cardiac monitor 10/04/2022 personally reviewed Dominant rhythm is sinus rhythm with intermittent atrial and ventricular pacing 110 SVT episodes, longest and fastest 40 minutes at 125 bpm 11% PVC burden No triggered episodes recorded   Laboratory Data:   High Sensitivity Troponin:   Last Labs      Recent Labs  Lab 07/01/23 0130 07/01/23 0345  TROPONINIHS 556* 505*        Chemistry Last Labs     Recent Labs  Lab 07/01/23 0130  NA 140  K 4.0  CL 109  CO2 22  GLUCOSE 115*  BUN 19  CREATININE 1.28*  CALCIUM 8.8*  GFRNONAA 55*  ANIONGAP 9      Last Labs     Recent Labs  Lab 07/01/23 0130  PROT 6.4*  ALBUMIN 3.9  AST 20  ALT 12  ALKPHOS 69  BILITOT 1.4*      Lipids  Last Labs  No results for input(s): "CHOL", "TRIG", "HDL", "LABVLDL", "LDLCALC", "CHOLHDL" in the last 168 hours.   Hematology Last Labs     Recent Labs  Lab 07/01/23 0130  WBC 12.6*  RBC 2.55*  HGB 8.5*  HCT 26.3*  MCV 103.1*  MCH 33.3  MCHC 32.3  RDW 25.1*  PLT 270      Thyroid  Last Labs  No results for input(s): "TSH", "FREET4" in the last 168 hours.   BNP Last Labs  No results for input(s): "BNP", "PROBNP" in the last 168 hours.    DDimer  Last Labs  No results for input(s): "DDIMER" in the last 168 hours.       Radiology/Studies:  ECHOCARDIOGRAM COMPLETE   Result Date: 07/01/2023    ECHOCARDIOGRAM REPORT   Patient Name:   AYDRIAN HALPIN Date of Exam: 07/01/2023 Medical Rec #:  [id number]    Height:       75.0 in Accession #:    [id number]   Weight:       191.8 lb Date of Birth:  [birth year]    BSA:           2.156 m Patient Age:    87 years     BP:           107/60 mmHg Patient Gender: M            HR:           81 bpm. Exam Location:  Inpatient Procedure: 2D Echo, Cardiac Doppler and Color Doppler Indications:    Pericardial effusion I31.3  History:  Patient has prior history of Echocardiogram examinations, most                 recent 06/01/2022. CAD, Pacemaker, CKD3, Signs/Symptoms:Chest                 Pain; Risk Factors:Hypertension and Former Smoker.  Sonographer:    Dondra Prader RVT RCS Referring Phys: [id number] ANGELA NICOLE DUKE IMPRESSIONS  1. Left ventricular ejection fraction, by estimation, is 60 to 65%. The left ventricle has normal function. The left ventricle has no regional wall motion abnormalities. Left ventricular diastolic parameters were normal.  2. Right ventricular systolic function is normal. The right ventricular size is normal. There is severely elevated pulmonary artery systolic pressure.  3. Left atrial size was mildly dilated.  4. Right atrial size was mildly dilated.  5. There is no evidence of cardiac tamponade.  6. The mitral valve is normal in structure. No evidence of mitral valve regurgitation. No evidence of mitral stenosis.  7. Tricuspid valve regurgitation is mild to moderate.  8. The aortic valve is tricuspid. There is mild calcification of the aortic valve. Aortic valve regurgitation is not visualized. Aortic valve sclerosis is present, with no evidence of aortic valve stenosis.  9. There is borderline dilatation of the ascending aorta, measuring 39 mm. 10. The inferior vena cava is dilated in size with <50% respiratory variability, suggesting right atrial pressure of 15 mmHg. Comparison(s): Prior images reviewed side by side. RA and systolic PA pressure are higher. FINDINGS  Left Ventricle: The mitral inflow A wave is diminutive, suggesting atrial mechanical failure or stunning, but diastolic mitral annulus e' velocities are normal. Left ventricular ejection fraction, by  estimation, is 60 to 65%. The left ventricle has normal function. The left ventricle has no regional wall motion abnormalities. The left ventricular internal cavity size was normal in size. There is no left ventricular hypertrophy. Left ventricular diastolic parameters were normal. Right Ventricle: The right ventricular size is normal. No increase in right ventricular wall thickness. Right ventricular systolic function is normal. There is severely elevated pulmonary artery systolic pressure. The tricuspid regurgitant velocity is 3.38 m/s, and with an assumed right atrial pressure of 15 mmHg, the estimated right ventricular systolic pressure is 60.7 mmHg. Left [facility]: Left atrial size was mildly dilated. Right [facility]: Right atrial size was mildly dilated. Pericardium: Trivial pericardial effusion is present. There is no evidence of cardiac tamponade. Mitral Valve: The mitral valve is normal in structure. No evidence of mitral valve regurgitation. No evidence of mitral valve stenosis. Tricuspid Valve: The tricuspid valve is normal in structure. Tricuspid valve regurgitation is mild to moderate. No evidence of tricuspid stenosis. Aortic Valve: The aortic valve is tricuspid. There is mild calcification of the aortic valve. Aortic valve regurgitation is not visualized. Aortic valve sclerosis is present, with no evidence of aortic valve stenosis. Aortic valve mean gradient measures 3.0 mmHg. Aortic valve peak gradient measures 5.3 mmHg. Aortic valve area, by VTI measures 3.32 cm. Pulmonic Valve: The pulmonic valve was normal in structure. Pulmonic valve regurgitation is not visualized. No evidence of pulmonic stenosis. Aorta: The aortic root is normal in size and structure. There is borderline dilatation of the ascending aorta, measuring 39 mm. Venous: The inferior vena cava is dilated in size with less than 50% respiratory variability, suggesting right atrial pressure of 15 mmHg. IAS/Shunts: No atrial level shunt  detected by color flow Doppler.  LEFT VENTRICLE PLAX 2D LVIDd:         4.90  cm   Diastology LVIDs:         2.80 cm   LV e' medial:    6.42 cm/s LV PW:         1.10 cm   LV E/e' medial:  15.0 LV IVS:        1.20 cm   LV e' lateral:   12.00 cm/s LVOT diam:     2.10 cm   LV E/e' lateral: 8.0 LV SV:         73 LV SV Index:   34 LVOT Area:     3.46 cm  RIGHT VENTRICLE             IVC RV S prime:     17.20 cm/s  IVC diam: 2.60 cm TAPSE (M-mode): 2.4 cm LEFT ATRIUM             Index        RIGHT ATRIUM           Index LA diam:        3.70 cm 1.72 cm/m   RA Area:     23.90 cm LA Vol (A2C):   61.0 ml 28.30 ml/m  RA Volume:   85.30 ml  39.57 ml/m LA Vol (A4C):   39.7 ml 18.42 ml/m LA Biplane Vol: 55.4 ml 25.70 ml/m  AORTIC VALVE                    PULMONIC VALVE AV Area (Vmax):    3.43 cm     PV Vmax:       0.99 m/s AV Area (Vmean):   3.40 cm     PV Peak grad:  3.9 mmHg AV Area (VTI):     3.32 cm AV Vmax:           115.00 cm/s AV Vmean:          74.700 cm/s AV VTI:            0.220 m AV Peak Grad:      5.3 mmHg AV Mean Grad:      3.0 mmHg LVOT Vmax:         114.00 cm/s LVOT Vmean:        73.400 cm/s LVOT VTI:          0.211 m LVOT/AV VTI ratio: 0.96  AORTA Ao Root diam: 3.40 cm Ao Asc diam:  3.90 cm MITRAL VALVE               TRICUSPID VALVE MV Area (PHT): 5.02 cm    TR Peak grad:   45.7 mmHg MV Decel Time: 151 msec    TR Vmax:        338.00 cm/s MV E velocity: 96.40 cm/s MV A velocity: 40.10 cm/s  SHUNTS MV E/A ratio:  2.40        Systemic VTI:  0.21 m                            Systemic Diam: 2.10 cm Rachelle Hora Nataleah Scioneaux MD Electronically signed by Thurmon Fair MD Signature Date/Time: 07/01/2023/12:10:46 PM    Final     CT Angio Chest/Abd/Pel for Dissection W and/or Wo Contrast   Result Date: 07/01/2023 CLINICAL DATA:  Acute aortic syndrome suspected. Chest pain, ablation on Friday. EXAM: CT ANGIOGRAPHY CHEST, ABDOMEN AND PELVIS TECHNIQUE: Non-contrast CT of the chest was initially obtained. Multidetector CT  imaging through the chest, abdomen  and pelvis was performed using the standard protocol during bolus administration of intravenous contrast. Multiplanar reconstructed images and MIPs were obtained and reviewed to evaluate the vascular anatomy. RADIATION DOSE REDUCTION: This exam was performed according to the departmental dose-optimization program which includes automated exposure control, adjustment of the mA and/or kV according to patient size and/or use of iterative reconstruction technique. CONTRAST:  OMNIPAQUE IOHEXOL 350 MG/ML SOLN COMPARISON:  04/11/2022. FINDINGS: CTA CHEST FINDINGS Cardiovascular: The heart is normal in size and there is a small pericardial effusion. Pacemaker leads are noted in the heart. Scattered coronary artery calcifications are present. There is atherosclerotic calcification of the aorta without evidence of aneurysm or dissection. The pulmonary trunk is mildly distended suggesting underlying pulmonary artery hypertension. Mediastinum/Nodes: No mediastinal, hilar, or axillary lymphadenopathy. The thyroid gland, trachea, and esophagus are within normal limits. Lungs/Pleura: Biapical pleural and parenchymal scarring is noted bilaterally. Mild bronchial wall thickening is noted bilaterally. Focal consolidation is noted in the left lower lobe, axial image 120. Atelectasis is present bilaterally. No effusion or pneumothorax. A 4 mm nodule is noted in the right middle lobe, axial image 97. There is a 4 mm nodule in the right middle lobe, axial image 110. Musculoskeletal: Degenerative changes are present in the thoracic spine. No acute or suspicious osseous abnormality. A pacemaker device is noted in the anterior chest wall on the left. Review of the MIP images confirms the above findings. CTA ABDOMEN AND PELVIS FINDINGS VASCULAR Aorta: Normal caliber aorta without aneurysm, dissection, vasculitis or significant stenosis. Aortic atherosclerosis. Celiac: Patent without evidence of  aneurysm, dissection, vasculitis or significant stenosis. SMA: Patent without evidence of aneurysm, dissection, vasculitis or significant stenosis. Renals: Both renal arteries are patent without evidence of aneurysm, dissection, vasculitis, fibromuscular dysplasia or significant stenosis. IMA: Patent without evidence of aneurysm, dissection, vasculitis or significant stenosis. Inflow: Patent without evidence of aneurysm, dissection, vasculitis or significant stenosis. Veins: No obvious venous abnormality within the limitations of this arterial phase study. Review of the MIP images confirms the above findings. NON-VASCULAR Hepatobiliary: No focal liver abnormality is seen. Status post cholecystectomy. No biliary dilatation. Pancreas: Unremarkable. No pancreatic ductal dilatation or surrounding inflammatory changes. Spleen: Normal in size without focal abnormality. Adrenals/Urinary Tract: The adrenal glands are within normal limits. The kidneys enhance symmetrically. Cysts are present in the kidneys bilaterally. Nonobstructive renal calculi are noted on the left. No hydronephrosis bilaterally. The bladder is not seen due to streak artifact. Stomach/Bowel: Stomach is within normal limits. Appendix appears normal. No evidence of bowel wall thickening, distention, or inflammatory changes. No free air or pneumatosis. Scattered diverticula are present along the colon without evidence of diverticulitis. Lymphatic: No abdominal or pelvic lymphadenopathy. Reproductive: Prostate gland is not seen due to hardware artifact. A penile prosthesis is noted with reservoir in the low anterior pelvis. Other: No abdominopelvic ascites. Musculoskeletal: Degenerative changes are present in the lumbar spine. Spinal fusion hardware is present from L3-L5. Total hip arthroplasty changes are present bilaterally. No acute osseous abnormality. Review of the MIP images confirms the above findings. IMPRESSION: 1. No evidence of aortic aneurysm or  dissection. 2. Focal consolidation in the right lower lobe, possible pneumonia. Follow-up is recommended until resolution. 3. Small pericardial effusion with coronary artery calcifications. 4. 4 mm right lower lobe pulmonary nodules. No follow-up needed if patient is low-risk (and has no known or suspected primary neoplasm). Non-contrast chest CT can be considered in 12 months if patient is high-risk. This recommendation follows the consensus statement: Guidelines for  Management of Incidental Pulmonary Nodules Detected on CT Images: From the Fleischner Society 2017; Radiology 2017; 284:228-243. 5. Nonobstructive left renal calculi. Electronically Signed   By: Thornell Sartorius M.D.   On: 07/01/2023 03:47    DG Chest 2 View   Result Date: 07/01/2023 CLINICAL DATA:  Chest pain. EXAM: CHEST - 2 VIEW COMPARISON:  05/30/2022. FINDINGS: The heart is enlarged and the mediastinal contour is within normal limits. Hyperinflation of the lungs is noted. Patchy airspace disease is noted at the lung bases bilaterally, greater on the left than on the right. No effusion or pneumothorax. Apical pleural thickening is noted bilaterally. A dual lead pacemaker is present over the left chest. No acute osseous abnormality. IMPRESSION: 1. Patchy airspace disease at the lung bases, suspicious for developing pneumonia. 2. Cardiomegaly. Electronically Signed   By: Thornell Sartorius M.D.   On: 07/01/2023 02:47       Assessment and Plan:    Postprocedural acute pericarditis: Opiates provide pain relief, but also lead to severe nausea and vomiting.  I have given him 1 dose of Toradol and he has started oral colchicine.  Echo does not show a large pericardial effusion, on the contrary he probably has a physiological amount of fluid.  Will continue Eliquis. A-fib: Status post redo ablation.  Currently in sinus rhythm.  Anticoagulated. SSS: Has a dual-chamber permanent pacemaker that was functioning normally at last interrogation 06/08/2023.      Risk Assessment/Risk Scores:          CHA2DS2-VASc Score = 2   This indicates a 2.2% annual risk of stroke. The patient's score is based upon: CHF History: 0 HTN History: 0 Diabetes History: 0 Stroke History: 0 Vascular Disease History: 0 Age Score: 2 Gender Score: 0       Code Status: Full Code   Severity of Illness: The appropriate patient status for this patient is OBSERVATION. Observation status is judged to be reasonable and necessary in order to provide the required intensity of service to ensure the patient's safety. The patient's presenting symptoms, physical exam findings, and initial radiographic and laboratory data in the context of their medical condition is felt to place them at decreased risk for further clinical deterioration. Furthermore, it is anticipated that the patient will be medically stable for discharge from the hospital within 2 midnights of admission.     For questions or updates, please contact [facility] Please consult [email or web address] for contact info under      Signed, Thurmon Fair, MD  07/01/2023 6:10 PM

## 2023-07-01 NOTE — ED Notes (Signed)
Md at bedside to assess patient

## 2023-07-02 ENCOUNTER — Other Ambulatory Visit: Payer: Self-pay | Admitting: Physician Assistant

## 2023-07-02 DIAGNOSIS — I480 Paroxysmal atrial fibrillation: Secondary | ICD-10-CM | POA: Diagnosis not present

## 2023-07-02 DIAGNOSIS — D539 Nutritional anemia, unspecified: Secondary | ICD-10-CM

## 2023-07-02 DIAGNOSIS — I308 Other forms of acute pericarditis: Secondary | ICD-10-CM | POA: Diagnosis not present

## 2023-07-02 DIAGNOSIS — Z950 Presence of cardiac pacemaker: Secondary | ICD-10-CM | POA: Diagnosis not present

## 2023-07-02 DIAGNOSIS — I495 Sick sinus syndrome: Secondary | ICD-10-CM | POA: Diagnosis not present

## 2023-07-02 DIAGNOSIS — D649 Anemia, unspecified: Secondary | ICD-10-CM

## 2023-07-02 LAB — CBC
HCT: 24.2 % — ABNORMAL LOW (ref 39.0–52.0)
HCT: 23.8 % — ABNORMAL LOW (ref 39.0–52.0)
Hemoglobin: 7.7 g/dL — ABNORMAL LOW (ref 13.0–17.0)
Hemoglobin: 7.8 g/dL — ABNORMAL LOW (ref 13.0–17.0)
MCH: 32.6 pg (ref 26.0–34.0)
MCH: 33.5 pg (ref 26.0–34.0)
MCHC: 31.8 g/dL (ref 30.0–36.0)
MCHC: 32.8 g/dL (ref 30.0–36.0)
MCV: 102.5 fL — ABNORMAL HIGH (ref 80.0–100.0)
MCV: 102.1 fL — ABNORMAL HIGH (ref 80.0–100.0)
Platelets: 226 10*3/uL (ref 150–400)
Platelets: 213 10*3/uL (ref 150–400)
RBC: 2.36 MIL/uL — ABNORMAL LOW (ref 4.22–5.81)
RBC: 2.33 MIL/uL — ABNORMAL LOW (ref 4.22–5.81)
RDW: 25.3 % — ABNORMAL HIGH (ref 11.5–15.5)
RDW: 25.2 % — ABNORMAL HIGH (ref 11.5–15.5)
WBC: 6.7 10*3/uL (ref 4.0–10.5)
WBC: 7.4 10*3/uL (ref 4.0–10.5)
nRBC: 0.4 % — ABNORMAL HIGH (ref 0.0–0.2)
nRBC: 0.5 % — ABNORMAL HIGH (ref 0.0–0.2)

## 2023-07-02 LAB — BASIC METABOLIC PANEL
Anion gap: 7 (ref 5–15)
BUN: 21 mg/dL (ref 8–23)
CO2: 28 mmol/L (ref 22–32)
Calcium: 8.7 mg/dL — ABNORMAL LOW (ref 8.9–10.3)
Chloride: 105 mmol/L (ref 98–111)
Creatinine, Ser: 1.43 mg/dL — ABNORMAL HIGH (ref 0.61–1.24)
GFR, Estimated: 48 mL/min — ABNORMAL LOW (ref >60)
Glucose, Bld: 116 mg/dL — ABNORMAL HIGH (ref 70–99)
Potassium: 3.9 mmol/L (ref 3.5–5.1)
Sodium: 140 mmol/L (ref 135–145)

## 2023-07-02 MED ORDER — COLCHICINE 0.6 MG PO TABS: 0.6000 mg | ORAL_TABLET | Freq: Every day | ORAL | 0 refills | Status: DC

## 2023-07-02 NOTE — Care Management CC44 (Signed)
Condition Code 44 Documentation Completed  Patient Details  Name: Todd Salazar MRN: [id number] Date of Birth: 1936-12-03   Condition Code 44 given:  Yes Patient signature on Condition Code 44 notice:  Yes Documentation of 2 MD's agreement:  Yes Code 44 added to claim:  Yes    Ronny Bacon, RN 07/02/2023, 1:38 PM

## 2023-07-02 NOTE — Plan of Care (Signed)
  Problem: Education: Goal: Understanding of cardiac disease, CV risk reduction, and recovery process will improve Outcome: Progressing   Problem: Cardiac: Goal: Ability to achieve and maintain adequate cardiovascular perfusion will improve Outcome: Progressing   Problem: Clinical Measurements: Goal: Respiratory complications will improve Outcome: Progressing   Problem: Elimination: Goal: Will not experience complications related to bowel motility Outcome: Progressing   Problem: Pain Managment: Goal: General experience of comfort will improve Outcome: Progressing   Problem: Safety: Goal: Ability to remain free from injury will improve Outcome: Progressing   Problem: Skin Integrity: Goal: Risk for impaired skin integrity will decrease Outcome: Progressing

## 2023-07-02 NOTE — Discharge Summary (Signed)
Discharge Summary    Patient ID: Todd Salazar MRN: [id number]; DOB: 25-Feb-1936  Admit date: 07/01/2023 Discharge date: 07/02/2023  PCP:  Patient, No Pcp Per   [facility] Providers Cardiologist:  Todd [name], MD  Electrophysiologist:  Todd Lemming, MD  {  Discharge Diagnoses    Principal Problem:   Pericarditis Active Problems:   Normocytic anemia   Tachycardia-bradycardia syndrome (HCC)   Pacemaker   Diagnostic Studies/Procedures    Echo 07/01/23: 1. Left ventricular ejection fraction, by estimation, is 60 to 65%. The  left ventricle has normal function. The left ventricle has no regional  wall motion abnormalities. Left ventricular diastolic parameters were  normal.   2. Right ventricular systolic function is normal. The right ventricular  size is normal. There is severely elevated pulmonary artery systolic  pressure.   3. Left atrial size was mildly dilated.   4. Right atrial size was mildly dilated.   5. There is no evidence of cardiac tamponade.   6. The mitral valve is normal in structure. No evidence of mitral valve  regurgitation. No evidence of mitral stenosis.   7. Tricuspid valve regurgitation is mild to moderate.   8. The aortic valve is tricuspid. There is mild calcification of the  aortic valve. Aortic valve regurgitation is not visualized. Aortic valve  sclerosis is present, with no evidence of aortic valve stenosis.   9. There is borderline dilatation of the ascending aorta, measuring 39  mm.  10. The inferior vena cava is dilated in size with <50% respiratory  variability, suggesting right atrial pressure of 15 mmHg.  _____________   History of Present Illness     Todd Salazar is a 87 y.o. male with a history of atrial fibrillation and atrial flutter, bronchiectasis with restrictive lung disease, tachycardia-bradycardia syndrome status post [employer] Jude dual-chamber permanent pacemaker, orthostatic hypotension, CKD stage III,  prostate cancer, frequent PVCs (5-6% by pacemaker recording).    He had a previous atrial fibrillation ablation 10/29/2021, then had a redo ablation procedure 06/29/2023.  Note that that previous ablation procedure was complicated by pericarditis and resolved with treatment with colchicine.   Previously failed treatment with sotalol, flecainide, propafenone and was poorly tolerant of AV nodal blocking agents.  Was treated with dofetilide in September 2022 which actually led to spontaneous conversion to sinus rhythm, but he subsequently had antiarrhythmic failure with recurrent A-fib with RVR.  He was treated with amiodarone in September 2022 but this was subsequently discontinued after he had pulmonary vein isolation.  He was last hospitalized for atrial fibrillation rapid ventricular sponsor and underwent cardioversion 06/01/2022.   Starting at 6 PM yesterday, roughly 24 hours after his ablation procedure he began experiencing worsening chest pain which is described as worsened by laying supine or taking deep breaths.  It is somewhat improved by sitting up.  The pain escalated in severity reaching 9/10 and he came to the emergency room.  He has obtained relief with Dilaudid, but each dose causes nausea and vomiting.  Has a history of nausea and vomiting with hydrocodone as well.  He has a history of laryngeal edema with tramadol.  He denies shortness of breath, dizziness, syncope or palpitations.  Right now he is fairly comfortable when sitting up and not taking a deep breath.   His ECG shows diffuse ST segment elevation, upwardly convex, evident in most leads with the exception of V1-V2 and aVR.  There is evidence of PR depression most obvious in leads II.  He has moderately elevated high-sensitivity troponin around 500, with a flat profile.  His C-reactive protein is elevated, but interestingly sedimentation rate is normal.   CT was reported as showing a small pericardial effusion, but by  echocardiographic evaluation this is trivial, even physiological.  He does have evidence of increased right atrial pressures and elevated PA pressure.  Hard to estimate left atrial filling pressures due to what appears to be atrial stunning with diminutive A waves. E/e' is borderline at 12-15.  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 - echo with trivial pericardial effusion - treated with toradol x 1 and colchicine 0.6 mg - CP improved, ambulated in hall - Todd treat with colchicine for 3 months   Anemia Hb 8.5 yesterday - on recheck this morning 7.7-7.8 No signs of active GI bleeding No large effusion on echo CT C/A/P negative for RP bleed Groin site without hematoma Todd discharge with close follow up of hemoglobin    Chronic anticoagulation Continue eliquis   Atrial fibrillation Now s/p Afib ablation x 2 Maintaining sinus rhythm on telemetry Not on amiodarone or cardizem, have taken these off med list   SSS s/p dual chamber PPM Not pacing on telemetry Normal device function on last interrogation   Question of PNA on CT chest Mild leukocytosis could be due to pericarditis Has received lasix 20 mg IV x 2 doses with improved crackles in bases, appears euvolemic Do not suspect he has active PNA, no indication for additional ABX   Pt seen and examined by Todd Salazar and deemed stable for discharge. Follow up has been arranged. CBC in 1 week      Did the patient have an acute coronary syndrome (MI, NSTEMI, STEMI, etc) this admission?:  No                               Did the patient have a percutaneous coronary intervention (stent / angioplasty)?:  No.        The patient Todd be scheduled for a TOC follow up appointment in 7-14 days.  A message has been sent to the [facility] and Scheduling Pool at the office where the  patient should be seen for follow up.  _____________  Discharge Vitals Blood pressure (!) 109/58, pulse (!) 118, temperature 98.7 F (37.1 C), temperature source Oral, resp. rate 15, height 6\' 3"  (1.905 m), weight 90.2 kg, SpO2 90%.  Filed Weights   07/01/23 0137 07/02/23 0447  Weight: 87 kg 90.2 kg    Labs & Radiologic Studies    CBC Recent Labs    07/02/23 0834 07/02/23 1141  WBC 6.7 7.4  HGB 7.7* 7.8*  HCT 24.2* 23.8*  MCV 102.5* 102.1*  PLT 226 213   Basic Metabolic Panel Recent Labs    40/98/11 0130 07/02/23 0834  NA 140 140  K 4.0 3.9  CL 109 105  CO2 22 28  GLUCOSE 115* 116*  BUN 19 21  CREATININE 1.28* 1.43*  CALCIUM 8.8* 8.7*   Liver Function Tests Recent Labs    07/01/23 0130  AST 20  ALT 12  ALKPHOS 69  BILITOT 1.4*  PROT 6.4*  ALBUMIN 3.9   Recent Labs    07/01/23 0130  LIPASE 29   High Sensitivity Troponin:   Recent Labs  Lab 07/01/23 0130 07/01/23 0345  TROPONINIHS 556* 505*    BNP Invalid input(s): "POCBNP" D-Dimer No results for input(s): "DDIMER" in the last 72 hours. Hemoglobin A1C No results for input(s): "HGBA1C" in the last 72 hours. Fasting Lipid Panel No results for input(s): "CHOL", "HDL", "LDLCALC", "TRIG", "CHOLHDL", "LDLDIRECT" in the last 72 hours. Thyroid Function Tests No results for input(s): "TSH", "T4TOTAL", "T3FREE", "THYROIDAB" in the last 72 hours.  Invalid input(s): "FREET3" _____________  ECHOCARDIOGRAM COMPLETE  Result Date: 07/01/2023    ECHOCARDIOGRAM REPORT   Patient Name:   Todd Salazar Date of Exam: 07/01/2023 Medical Rec #:  [id number]    Height:       75.0 in Accession #:    [id number]   Weight:       191.8 lb Date of Birth:  1935/12/17    BSA:          2.156 m Patient Age:    87 years     BP:           107/60 mmHg Patient Gender: M            HR:           81 bpm. Exam Location:  Inpatient Procedure: 2D Echo, Cardiac Doppler and Color Doppler Indications:    Pericardial effusion I31.3  History:         Patient has prior history of Echocardiogram examinations, most                 recent 06/01/2022. CAD, Pacemaker, CKD3, Signs/Symptoms:Chest                 Pain; Risk Factors:Hypertension and Former Smoker.  Sonographer:    Todd Salazar RVT RCS Referring Phys: [id number] Todd Salazar Todd Salazar IMPRESSIONS  1. Left ventricular ejection fraction, by estimation, is 60 to 65%. The left ventricle has normal function. The left ventricle has no regional wall motion abnormalities. Left ventricular diastolic parameters were normal.  2. Right ventricular systolic function is normal. The right ventricular size is normal. There is severely elevated pulmonary artery systolic pressure.  3. Left atrial size was mildly dilated.  4. Right atrial size was mildly dilated.  5. There is no evidence of cardiac tamponade.  6. The mitral valve is normal in structure. No evidence of mitral valve regurgitation. No evidence of mitral stenosis.  7. Tricuspid valve regurgitation is mild to moderate.  8. The aortic valve is tricuspid. There is mild calcification of the aortic valve. Aortic valve regurgitation is not visualized. Aortic valve sclerosis is present, with no evidence of aortic valve stenosis.  9. There is borderline dilatation of the ascending aorta, measuring 39 mm. 10. The inferior vena cava is dilated in size with <50% respiratory variability, suggesting right atrial pressure of 15 mmHg. Comparison(s): Prior images reviewed side by side. RA and systolic PA pressure are higher. FINDINGS  Left Ventricle: The mitral inflow A wave is diminutive, suggesting atrial mechanical failure or stunning, but diastolic mitral annulus e' velocities are normal. Left ventricular ejection fraction, by estimation, is 60 to 65%. The left ventricle has normal function. The left ventricle has no regional wall motion abnormalities. The left ventricular internal cavity size was normal in size. There is no left ventricular hypertrophy. Left ventricular  diastolic parameters were normal. Right Ventricle: The right ventricular size is normal. No increase in right ventricular wall thickness. Right ventricular systolic function is normal. There is severely elevated pulmonary artery systolic pressure. The tricuspid regurgitant velocity is  3.38 m/s, and with an assumed right atrial pressure of 15 mmHg, the estimated right ventricular systolic pressure is 60.7 mmHg. Left Atrium: Left atrial size was mildly dilated. Right Atrium: Right atrial size was mildly dilated. Pericardium: Trivial pericardial effusion is present. There is no evidence of cardiac tamponade. Mitral Valve: The mitral valve is normal in structure. No evidence of mitral valve regurgitation. No evidence of mitral valve stenosis. Tricuspid Valve: The tricuspid valve is normal in structure. Tricuspid valve regurgitation is mild to moderate. No evidence of tricuspid stenosis. Aortic Valve: The aortic valve is tricuspid. There is mild calcification of the aortic valve. Aortic valve regurgitation is not visualized. Aortic valve sclerosis is present, with no evidence of aortic valve stenosis. Aortic valve mean gradient measures 3.0 mmHg. Aortic valve peak gradient measures 5.3 mmHg. Aortic valve area, by VTI measures 3.32 cm. Pulmonic Valve: The pulmonic valve was normal in structure. Pulmonic valve regurgitation is not visualized. No evidence of pulmonic stenosis. Aorta: The aortic root is normal in size and structure. There is borderline dilatation of the ascending aorta, measuring 39 mm. Venous: The inferior vena cava is dilated in size with less than 50% respiratory variability, suggesting right atrial pressure of 15 mmHg. IAS/Shunts: No atrial level shunt detected by color flow Doppler.  LEFT VENTRICLE PLAX 2D LVIDd:         4.90 cm   Diastology LVIDs:         2.80 cm   LV e' medial:    6.42 cm/s LV PW:         1.10 cm   LV E/e' medial:  15.0 LV IVS:        1.20 cm   LV e' lateral:   12.00 cm/s LVOT diam:      2.10 cm   LV E/e' lateral: 8.0 LV SV:         73 LV SV Index:   34 LVOT Area:     3.46 cm  RIGHT VENTRICLE             IVC RV S prime:     17.20 cm/s  IVC diam: 2.60 cm TAPSE (M-mode): 2.4 cm LEFT ATRIUM             Index        RIGHT ATRIUM           Index LA diam:        3.70 cm 1.72 cm/m   RA Area:     23.90 cm LA Vol (A2C):   61.0 ml 28.30 ml/m  RA Volume:   85.30 ml  39.57 ml/m LA Vol (A4C):   39.7 ml 18.42 ml/m LA Biplane Vol: 55.4 ml 25.70 ml/m  AORTIC VALVE                    PULMONIC VALVE AV Area (Vmax):    3.43 cm     PV Vmax:       0.99 m/s AV Area (Vmean):   3.40 cm     PV Peak grad:  3.9 mmHg AV Area (VTI):     3.32 cm AV Vmax:           115.00 cm/s AV Vmean:          74.700 cm/s AV VTI:            0.220 m AV Peak Grad:      5.3 mmHg AV Mean Grad:      3.0 mmHg  LVOT Vmax:         114.00 cm/s LVOT Vmean:        73.400 cm/s LVOT VTI:          0.211 m LVOT/AV VTI ratio: 0.96  AORTA Ao Root diam: 3.40 cm Ao Asc diam:  3.90 cm MITRAL VALVE               TRICUSPID VALVE MV Area (PHT): 5.02 cm    TR Peak grad:   45.7 mmHg MV Decel Time: 151 msec    TR Vmax:        338.00 cm/s MV E velocity: 96.40 cm/s MV A velocity: 40.10 cm/s  SHUNTS MV E/A ratio:  2.40        Systemic VTI:  0.21 m                            Systemic Diam: 2.10 cm Todd Fair MD Electronically signed by Todd Fair MD Signature Date/Time: 07/01/2023/12:10:46 PM    Final    CT Angio Chest/Abd/Pel for Dissection W and/or Wo Contrast  Result Date: 07/01/2023 CLINICAL DATA:  Acute aortic syndrome suspected. Chest pain, ablation on Friday. EXAM: CT ANGIOGRAPHY CHEST, ABDOMEN AND PELVIS TECHNIQUE: Non-contrast CT of the chest was initially obtained. Multidetector CT imaging through the chest, abdomen and pelvis was performed using the standard protocol during bolus administration of intravenous contrast. Multiplanar reconstructed images and MIPs were obtained and reviewed to evaluate the vascular anatomy. RADIATION DOSE  REDUCTION: This exam was performed according to the departmental dose-optimization program which includes automated exposure control, adjustment of the mA and/or kV according to patient size and/or use of iterative reconstruction technique. CONTRAST:  OMNIPAQUE IOHEXOL 350 MG/ML SOLN COMPARISON:  04/11/2022. FINDINGS: CTA CHEST FINDINGS Cardiovascular: The heart is normal in size and there is a small pericardial effusion. Pacemaker leads are noted in the heart. Scattered coronary artery calcifications are present. There is atherosclerotic calcification of the aorta without evidence of aneurysm or dissection. The pulmonary trunk is mildly distended suggesting underlying pulmonary artery hypertension. Mediastinum/Nodes: No mediastinal, hilar, or axillary lymphadenopathy. The thyroid gland, trachea, and esophagus are within normal limits. Lungs/Pleura: Biapical pleural and parenchymal scarring is noted bilaterally. Mild bronchial wall thickening is noted bilaterally. Focal consolidation is noted in the left lower lobe, axial image 120. Atelectasis is present bilaterally. No effusion or pneumothorax. A 4 mm nodule is noted in the right middle lobe, axial image 97. There is a 4 mm nodule in the right middle lobe, axial image 110. Musculoskeletal: Degenerative changes are present in the thoracic spine. No acute or suspicious osseous abnormality. A pacemaker device is noted in the anterior chest wall on the left. Review of the MIP images confirms the above findings. CTA ABDOMEN AND PELVIS FINDINGS VASCULAR Aorta: Normal caliber aorta without aneurysm, dissection, vasculitis or significant stenosis. Aortic atherosclerosis. Celiac: Patent without evidence of aneurysm, dissection, vasculitis or significant stenosis. SMA: Patent without evidence of aneurysm, dissection, vasculitis or significant stenosis. Renals: Both renal arteries are patent without evidence of aneurysm, dissection, vasculitis, fibromuscular dysplasia  or significant stenosis. IMA: Patent without evidence of aneurysm, dissection, vasculitis or significant stenosis. Inflow: Patent without evidence of aneurysm, dissection, vasculitis or significant stenosis. Veins: No obvious venous abnormality within the limitations of this arterial phase study. Review of the MIP images confirms the above findings. NON-VASCULAR Hepatobiliary: No focal liver abnormality is seen. Status post cholecystectomy. No biliary dilatation. Pancreas: Unremarkable. No  pancreatic ductal dilatation or surrounding inflammatory changes. Spleen: Normal in size without focal abnormality. Adrenals/Urinary Tract: The adrenal glands are within normal limits. The kidneys enhance symmetrically. Cysts are present in the kidneys bilaterally. Nonobstructive renal calculi are noted on the left. No hydronephrosis bilaterally. The bladder is not seen due to streak artifact. Stomach/Bowel: Stomach is within normal limits. Appendix appears normal. No evidence of bowel wall thickening, distention, or inflammatory changes. No free air or pneumatosis. Scattered diverticula are present along the colon without evidence of diverticulitis. Lymphatic: No abdominal or pelvic lymphadenopathy. Reproductive: Prostate gland is not seen due to hardware artifact. A penile prosthesis is noted with reservoir in the low anterior pelvis. Other: No abdominopelvic ascites. Musculoskeletal: Degenerative changes are present in the lumbar spine. Spinal fusion hardware is present from L3-L5. Total hip arthroplasty changes are present bilaterally. No acute osseous abnormality. Review of the MIP images confirms the above findings. IMPRESSION: 1. No evidence of aortic aneurysm or dissection. 2. Focal consolidation in the right lower lobe, possible pneumonia. Follow-up is recommended until resolution. 3. Small pericardial effusion with coronary artery calcifications. 4. 4 mm right lower lobe pulmonary nodules. No follow-up needed if patient  is low-risk (and has no known or suspected primary neoplasm). Non-contrast chest CT can be considered in 12 months if patient is high-risk. This recommendation follows the consensus statement: Guidelines for Management of Incidental Pulmonary Nodules Detected on CT Images: From the Fleischner Society 2017; Radiology 2017; 284:228-243. 5. Nonobstructive left renal calculi. Electronically Signed   By: Thornell Sartorius M.D.   On: 07/01/2023 03:47   DG Chest 2 View  Result Date: 07/01/2023 CLINICAL DATA:  Chest pain. EXAM: CHEST - 2 VIEW COMPARISON:  05/30/2022. FINDINGS: The heart is enlarged and the mediastinal contour is within normal limits. Hyperinflation of the lungs is noted. Patchy airspace disease is noted at the lung bases bilaterally, greater on the left than on the right. No effusion or pneumothorax. Apical pleural thickening is noted bilaterally. A dual lead pacemaker is present over the left chest. No acute osseous abnormality. IMPRESSION: 1. Patchy airspace disease at the lung bases, suspicious for developing pneumonia. 2. Cardiomegaly. Electronically Signed   By: Thornell Sartorius M.D.   On: 07/01/2023 02:47   EP STUDY  Result Date: 06/29/2023 SURGEON:  Todd Brooklyn, MD PREPROCEDURE DIAGNOSES: 1. Persistent atrial fibrillation. 2.  Atypical atrial flutter POSTPROCEDURE DIAGNOSES: 1. Persistent atrial fibrillation. 2.  Atypical atrial flutter PROCEDURES: 1. Comprehensive electrophysiologic study. 2. Coronary sinus pacing and recording. 3. Three-dimensional mapping of atrial fibrillation (with additional mapping and ablation within the left atrium due to persistence of afib) 4. Ablation of atrial fibrillation (with additional mapping and ablation within the left atrium due to persistence of afib) 5. Intracardiac echocardiography. 6. Transseptal puncture of an intact septum. 7. Arrhythmia induction with pacing 8. External cardioversion. INTRODUCTION:  Todd Salazar is a 87 y.o. male with a history of  persistent atrial fibrillation who now presents for EP study and radiofrequency ablation.  The patient reports initially being diagnosed with atrial fibrillation after presenting with symptomatic palpitations and fatgiue.  He is a prior ablation but has had more episodes of atrial fibrillation and flutter.  The patient therefore presents today for catheter ablation of atrial fibrillation. DESCRIPTION OF PROCEDURE:  Informed written consent was obtained, and the patient was brought to the electrophysiology lab in a fasting state.  The patient was adequately sedated with intravenous medications as outlined in the anesthesia report.  The patient's left and right  groins were prepped and draped in the usual sterile fashion by the EP lab staff.  Using a percutaneous Seldinger technique, two 8-French hemostasis sheaths were placed in the right femoral vein, and one 7 [place] and one 11-French hemostasis sheaths were placed into the left common femoral vein. An esophageal temperature probe was inserted to monitor for heating of the esophagus during the procedure.  Each sheath site was preclosed using an Abbott Perclose and was closed at the end of the case. Direct ultrasound guidance is used for right and left femoral veins with normal vessel patency. Ultrasound images are captured and stored in the patient's chart. Using ultrasound guidance, the Brockenbrough needle and wire were visualized entering the vessel. Catheter Placement:  A 7-French Biosense [employer] Decapolar coronary sinus catheter was introduced through the right common femoral vein and advanced into the coronary sinus for recording and pacing from this location.  A luminal esophageal temperature probe was placed and used for continuous monitoring of the luminal esophageal temperature throughout the procedure as well as to localize the esophagus on fluoroscopy. In addition, the esophagus was directly visualized with intracardiac echo and its positioned marked on  Carto.  During ablation at the posterior wall there was limited esophageal heating noted during RF energy delivery with the maximal temperature recorded by the luminal temperature probe of < 38.5 degrees C. Initial Measurements: The patient presented to the electrophysiology lab in sinus rhythm. his  PR interval measured 197 msec with a QRS duration of 110 msec and a QT interval of 412 msec.   Intracardiac Echocardiography: An 8-French [employer] AcuNav intracardiac echocardiography catheter was introduced through the right common femoral vein and advanced into the right atrium. Intracardiac echocardiography was performed of the left atrium, and a three-dimensional anatomical rendering of the left atrium was performed using CARTO sound technology.  The patient was noted to have a moderate sized left atrium.  The interatrial septum was prominent but not aneurysmal. All 4 pulmonary veins were visualized and noted to have separate ostia.  The pulmonary veins were moderate in size.  The left atrial appendage was visualized and did not reveal thrombus.   There was no evidence of pulmonary vein stenosis. Transseptal Puncture: The right common femoral vein sheaths were exchanged for one 8.5 [employer] and one Bayless sheath and transseptal access was achieved with the Bayless in a standard fashion using a Bayless needle under fluoroscopy with intracardiac echocardiography confirmation of the transseptal puncture.  Once transseptal access had been achieved, heparin was administered intravenously and intra- arterially in order to maintain an ACT of greater than 350 seconds throughout the procedure.  3D Mapping and Ablation: A 3.5 mm Biosense [employer] ST/SF Thermocool ablation catheter was advanced into the right atrium through the Visigo sheath.  The transseptal sheath was pulled back into the IVC over a guidewire.  The ablation catheter was advanced across the transseptal hole using the  wire as a guide.  The transseptal sheath was then re-advanced over the guidewire into the left atrium.  A Biosense [employer] mapping catheter was introduced through the transseptal sheath and positioned over the mouth of all 4 pulmonary veins.  Three-dimensional electroanatomical mapping was performed using CARTO technology.  This demonstrated electrical activity within the right-sided pulmonary veins at baseline. The patient underwent successful sequential electrical isolation and anatomical encircling of the right sided pulmonary veins using radiofrequency current with a circular mapping catheter as a guide. A WACA approach was used. Due  to persistence of atrial fibrillation, additional left atrial mapping and ablation was performed.  A series of radiofrequency lesions were delivered along the roof and floor of the left atrium in order to create a "standard box" lesion along the posterior wall of the left atrium. The patient went into atrial flutter with catheter movement.  3D mapping of atrial flutter showed an atrial flutter circuit that appeared to be along the anterior wall.  Ablation was performed from the left superior pulmonary vein to the mitral valve.  Tachycardia continued.  Ablation was then performed from the right superior pulmonary vein to the mitral valve.  Tachycardia lengthened, but did not terminate.  Due to a high burden of scar, the patient was cardioverted as below.  Bidirectional block was confirmed across the anterior ablation lines. Cardioversion: The patient was then cardioverted to sinus rhythm with a single synchronized 150-J biphasic shock with cardioversion electrodes in the anterior-posterior thoracic configuration.  he remained in sinus rhythm thereafter. Measurements Following Ablation: In sinus rhythm the RR interval was , with PR 199 msec, QRS 131 msec, and QT 439 msec.  Following ablation the AH interval measured 127 msec with an HV interval of 37 msec. Ventricular  pacing was performed, which revealed VA dissociation at 600 msec. Rapid atrial pacing was performed, which revealed an AV Wenckebach cycle length of 400 msec.  Electroisolation was then again confirmed in all four pulmonary veins. The procedure was therefore considered completed.  All catheters were removed, and the sheaths were aspirated and flushed.  The patient was transferred to the recovery area for sheath removal per protocol.  Intracardiac echocardiogram revealed no pericardial effusion. EBL<55ml.  There were no early apparent complications. CONCLUSIONS: 1.  Sinus rhythm upon presentation.  2. Successful electrical isolation and anatomical encircling of all four pulmonary veins with radiofrequency current.  A WACA approach was used 3. Additional left atrial ablation was performed with a standard box lesion created along the posterior wall of the left atrium 4.  Atrial flutter induced with catheter movement, ablation along the anterior wall of the left atrium 5.  Cardioversion from atrial flutter to sinus rhythm 6.  No early apparent complications. Todd Daphine Deutscher Camnitz,MD 2:59 PM 06/29/2023   CT CARDIAC MORPH/PULM VEIN W/CM&W/O CA SCORE  Addendum Date: 06/27/2023   ADDENDUM REPORT: 06/27/2023 21:54 EXAM: OVER-READ INTERPRETATION  CT CHEST The following report is an over-read performed by radiologist Dr. Curly Shores [facility] on 06/27/2023. This over-read does not include interpretation of cardiac or coronary anatomy or pathology. The coronary CTA interpretation by the cardiologist is attached. COMPARISON:  05/30/2022. FINDINGS: Cardiovascular:  Findings discussed in the body of the report. Mediastinum/Nodes: No suspicious adenopathy identified. Imaged mediastinal structures are unremarkable. Lungs/Pleura: There is dependent basilar subsegmental atelectasis. No pneumonia or pulmonary edema. No pleural effusion or pneumothorax. Stable right middle lobe nodule measuring 4 mm. Upper Abdomen:  No acute abnormality. Musculoskeletal: No chest wall abnormality. No acute or significant osseous findings. IMPRESSION: Right middle lobe unchanged 4 mm lung nodule. Otherwise no significant extracardiac incidental findings identified. Electronically Signed   By: Layla Maw M.D.   On: 06/27/2023 21:54   Result Date: 06/27/2023 CLINICAL DATA:  Atrial fibrillation scheduled for ablation. EXAM: Cardiac CTA TECHNIQUE: A non-contrast, gated CT scan was obtained with axial slices of 3 mm through the heart for calcium scoring. Calcium scoring was performed using the Agatston method. A 120 kV retrospective, gated, contrast cardiac scan was obtained. Gantry rotation speed was 250 msecs and  collimation was 0.6 mm. Nitroglycerin was not given. A delayed scan was obtained to exclude left atrial appendage thrombus. The 3D dataset was reconstructed in 5% intervals of the 0-95% of the R-R cycle. Late systolic phases were analyzed on a dedicated workstation using MPR, MIP, and VRT modes. The patient received 80 cc of contrast. FINDINGS: Image quality: Excellent. Noise artifact is: Limited. Pulmonary Veins: There is normal pulmonary vein drainage into the left atrium (2 on the right and 2 on the left) with ostial measurements as follows: RUPV: Ostium 19.1 mm x 16.1 mm  area 2.23 cm2 RLPV:  Ostium 25.7 mm x 16.4 mm   area 3.30 cm2 LUPV:  Ostium 24.6 mm x 22.5 mm area 4.32 cm2 LLPV:  Ostium 27 mm x 21.6 mm  area 4.28 cm2 Left Atrium: The left atrial size is normal. There is no PFO/ASD. The left atrial appendage is large chicken wing type. There is no thrombus in the left atrial appendage on contrast or delayed imaging. The esophagus runs in the left atrial midline and is not in proximity to any of the pulmonary vein ostia. Coronary Arteries: CAC score of 50.1, which is 3 percentile for age-, race-, and sex-matched controls. Normal coronary origin. Right dominance. The study was performed without use of NTG and is insufficient  for plaque evaluation. Right Atrium: Right atrial size is within normal limits. Right Ventricle: The right ventricular cavity is within normal limits. Left Ventricle: The ventricular cavity size is within normal limits. There are no stigmata of prior infarction. There is no abnormal filling defect. Pericardium: Normal thickness with no significant effusion or calcium present. Pulmonary Artery: Normal caliber without proximal filling defect. Cardiac valves: The aortic valve is trileaflet without significant calcification. The mitral valve is normal structure without significant calcification. Aorta: Normal caliber with no significant disease. Pacemaker noted. Extra-cardiac findings: See attached radiology report for non-cardiac structures. IMPRESSION: 1. There is normal pulmonary vein drainage into the left atrium with ostial measurements above. 2. There is no thrombus in the left atrial appendage. 3. The esophagus runs in the left atrial midline and is not in proximity to any of the pulmonary vein ostia. 4. No PFO/ASD. 5. Normal coronary origin. Right dominance. 6. CAC score of 50.1 Which is 3 percentile for age-, race-, and sex-matched controls. 7.  Status post pacemaker implantation. Todd Ripple, DO The noncardiac portion of this study Todd be interpreted in separate report by the radiologist. Electronically Signed: By: Todd Salazar D.O. On: 06/22/2023 11:37   CUP PACEART REMOTE DEVICE CHECK  Result Date: 06/08/2023 Scheduled remote reviewed. Normal device function.  Next remote 91 days. LA, CVRS  Disposition   Pt is being discharged home today in good condition.  Follow-up Plans & Appointments     Discharge Instructions     Diet - low sodium heart healthy   Complete by: As directed    Discharge instructions   Complete by: As directed    Please present to [facility] office for a blood draw next Thursday 07/06/23. You do not need to be fasting.   Increase activity slowly   Complete by: As directed          Discharge Medications   Allergies as of 07/02/2023       Reactions   Ciprofloxacin Anaphylaxis, Other (See Comments)   Dizziness, also   Tramadol Swelling, Other (See Comments)   Laryngeal edema   Duloxetine Other (See Comments)   Delusions   Hydrocodone-acetaminophen Nausea And Vomiting  Nisoldipine Other (See Comments)   Pt unsure of reaction    Benadryl [diphenhydramine] Anxiety, Other (See Comments)   Panic attack    Methocarbamol Nausea Only   Ondansetron Hcl Nausea Only   Sulfa Antibiotics Rash        Medication List     STOP taking these medications    amiodarone 200 MG tablet Commonly known as: PACERONE   diltiazem 180 MG 24 hr capsule Commonly known as: CARDIZEM CD   famotidine 20 MG tablet Commonly known as: PEPCID       TAKE these medications    acetaminophen 500 MG tablet Commonly known as: TYLENOL Take 1,000 mg by mouth as needed for moderate pain.   colchicine 0.6 MG tablet Take 1 tablet (0.6 mg total) by mouth daily. Start taking on: July 03, 2023   Eliquis 5 MG Tabs tablet Generic drug: apixaban TAKE 1 TABLET BY MOUTH 2 TIMES A DAY   gabapentin 300 MG capsule Commonly known as: NEURONTIN Take 300 mg by mouth See admin instructions. Take with 100 mg for a total of 400 mg at bedtime   gabapentin 100 MG capsule Commonly known as: NEURONTIN Take 100 mg by mouth See admin instructions. Take with 300 mg for a total of 400 mg at bedtime   nitroGLYCERIN 0.4 MG SL tablet Commonly known as: NITROSTAT Place 0.4 mg under the tongue every 5 (five) minutes as needed for chest pain.   TUMS PO Take 4 tablets by mouth as needed (discomfort).           Outstanding Labs/Studies   CBC next week  Duration of Discharge Encounter   Greater than 30 minutes including physician time.  Signed, Roe Rutherford Cordale Manera, PA 07/02/2023, 1:08 PM

## 2023-07-02 NOTE — Progress Notes (Addendum)
Rounding Note    Patient Name: Todd Salazar Date of Encounter: 07/02/2023  [facility] Cardiologist: Will [name], MD   Subjective   Pt states CP is improved, has not walked in the hall yet  Inpatient Medications    Scheduled Meds:  apixaban  5 mg Oral BID   colchicine  0.6 mg Oral Daily   furosemide  20 mg Intravenous Q12H   Continuous Infusions:  PRN Meds: acetaminophen, nitroGLYCERIN, ondansetron (ZOFRAN) IV, oxyCODONE-acetaminophen   Vital Signs    Vitals:   07/01/23 1627 07/01/23 2023 07/01/23 2347 07/02/23 0447  BP: 123/72 108/64 102/65 (!) 102/47  Pulse: 79 81 84 83  Resp:  16 19 18   Temp: 97.7 F (36.5 C) 97.7 F (36.5 C)  98.7 F (37.1 C)  TempSrc: Oral Oral  Oral  SpO2: 92% 92% 99% 99%  Weight:    90.2 kg  Height:        Intake/Output Summary (Last 24 hours) at 07/02/2023 0802 Last data filed at 07/02/2023 0515 Gross per 24 hour  Intake 300 ml  Output 1355 ml  Net -1055 ml      07/02/2023    4:47 AM 07/01/2023    1:37 AM 06/29/2023   10:56 AM  Last 3 Weights  Weight (lbs) 198 lb 12.8 oz 191 lb 12.8 oz 190 lb  Weight (kg) 90.175 kg 87 kg 86.183 kg      Telemetry    Sinus rhythm in the 80s - Personally Reviewed  ECG    No new tracings - Personally Reviewed  Physical Exam   GEN: No acute distress.   Neck: No JVD Cardiac: RRR, no murmurs, rubs, or gallops.  Respiratory: crackles in left base GI: Soft, nontender, non-distended  MS: No edema; No deformity. Neuro:  Nonfocal  Psych: Normal affect   Labs    High Sensitivity Troponin:   Recent Labs  Lab 07/01/23 0130 07/01/23 0345  TROPONINIHS 556* 505*     Chemistry Recent Labs  Lab 07/01/23 0130  NA 140  K 4.0  CL 109  CO2 22  GLUCOSE 115*  BUN 19  CREATININE 1.28*  CALCIUM 8.8*  PROT 6.4*  ALBUMIN 3.9  AST 20  ALT 12  ALKPHOS 69  BILITOT 1.4*  GFRNONAA 55*  ANIONGAP 9    Lipids No results for input(s): "CHOL", "TRIG", "HDL", "LABVLDL",  "LDLCALC", "CHOLHDL" in the last 168 hours.  Hematology Recent Labs  Lab 07/01/23 0130  WBC 12.6*  RBC 2.55*  HGB 8.5*  HCT 26.3*  MCV 103.1*  MCH 33.3  MCHC 32.3  RDW 25.1*  PLT 270   Thyroid No results for input(s): "TSH", "FREET4" in the last 168 hours.  BNPNo results for input(s): "BNP", "PROBNP" in the last 168 hours.  DDimer No results for input(s): "DDIMER" in the last 168 hours.   Radiology    ECHOCARDIOGRAM COMPLETE  Result Date: 07/01/2023    ECHOCARDIOGRAM REPORT   Patient Name:   TYMEIR WEATHINGTON Date of Exam: 07/01/2023 Medical Rec #:  [id number]    Height:       75.0 in Accession #:    [id number]   Weight:       191.8 lb Date of Birth:  15-Oct-1936    BSA:          2.156 m Patient Age:    87 years     BP:           107/60 mmHg Patient Gender: M  HR:           81 bpm. Exam Location:  Inpatient Procedure: 2D Echo, Cardiac Doppler and Color Doppler Indications:    Pericardial effusion I31.3  History:        Patient has prior history of Echocardiogram examinations, most                 recent 06/01/2022. CAD, Pacemaker, CKD3, Signs/Symptoms:Chest                 Pain; Risk Factors:Hypertension and Former Smoker.  Sonographer:    Dondra Prader RVT RCS Referring Phys: [id number] ANGELA NICOLE DUKE IMPRESSIONS  1. Left ventricular ejection fraction, by estimation, is 60 to 65%. The left ventricle has normal function. The left ventricle has no regional wall motion abnormalities. Left ventricular diastolic parameters were normal.  2. Right ventricular systolic function is normal. The right ventricular size is normal. There is severely elevated pulmonary artery systolic pressure.  3. Left atrial size was mildly dilated.  4. Right atrial size was mildly dilated.  5. There is no evidence of cardiac tamponade.  6. The mitral valve is normal in structure. No evidence of mitral valve regurgitation. No evidence of mitral stenosis.  7. Tricuspid valve regurgitation is mild to moderate.  8. The  aortic valve is tricuspid. There is mild calcification of the aortic valve. Aortic valve regurgitation is not visualized. Aortic valve sclerosis is present, with no evidence of aortic valve stenosis.  9. There is borderline dilatation of the ascending aorta, measuring 39 mm. 10. The inferior vena cava is dilated in size with <50% respiratory variability, suggesting right atrial pressure of 15 mmHg. Comparison(s): Prior images reviewed side by side. RA and systolic PA pressure are higher. FINDINGS  Left Ventricle: The mitral inflow A wave is diminutive, suggesting atrial mechanical failure or stunning, but diastolic mitral annulus e' velocities are normal. Left ventricular ejection fraction, by estimation, is 60 to 65%. The left ventricle has normal function. The left ventricle has no regional wall motion abnormalities. The left ventricular internal cavity size was normal in size. There is no left ventricular hypertrophy. Left ventricular diastolic parameters were normal. Right Ventricle: The right ventricular size is normal. No increase in right ventricular wall thickness. Right ventricular systolic function is normal. There is severely elevated pulmonary artery systolic pressure. The tricuspid regurgitant velocity is 3.38 m/s, and with an assumed right atrial pressure of 15 mmHg, the estimated right ventricular systolic pressure is 60.7 mmHg. Left Atrium: Left atrial size was mildly dilated. Right Atrium: Right atrial size was mildly dilated. Pericardium: Trivial pericardial effusion is present. There is no evidence of cardiac tamponade. Mitral Valve: The mitral valve is normal in structure. No evidence of mitral valve regurgitation. No evidence of mitral valve stenosis. Tricuspid Valve: The tricuspid valve is normal in structure. Tricuspid valve regurgitation is mild to moderate. No evidence of tricuspid stenosis. Aortic Valve: The aortic valve is tricuspid. There is mild calcification of the aortic valve. Aortic  valve regurgitation is not visualized. Aortic valve sclerosis is present, with no evidence of aortic valve stenosis. Aortic valve mean gradient measures 3.0 mmHg. Aortic valve peak gradient measures 5.3 mmHg. Aortic valve area, by VTI measures 3.32 cm. Pulmonic Valve: The pulmonic valve was normal in structure. Pulmonic valve regurgitation is not visualized. No evidence of pulmonic stenosis. Aorta: The aortic root is normal in size and structure. There is borderline dilatation of the ascending aorta, measuring 39 mm. Venous: The inferior vena cava is  dilated in size with less than 50% respiratory variability, suggesting right atrial pressure of 15 mmHg. IAS/Shunts: No atrial level shunt detected by color flow Doppler.  LEFT VENTRICLE PLAX 2D LVIDd:         4.90 cm   Diastology LVIDs:         2.80 cm   LV e' medial:    6.42 cm/s LV PW:         1.10 cm   LV E/e' medial:  15.0 LV IVS:        1.20 cm   LV e' lateral:   12.00 cm/s LVOT diam:     2.10 cm   LV E/e' lateral: 8.0 LV SV:         73 LV SV Index:   34 LVOT Area:     3.46 cm  RIGHT VENTRICLE             IVC RV S prime:     17.20 cm/s  IVC diam: 2.60 cm TAPSE (M-mode): 2.4 cm LEFT ATRIUM             Index        RIGHT ATRIUM           Index LA diam:        3.70 cm 1.72 cm/m   RA Area:     23.90 cm LA Vol (A2C):   61.0 ml 28.30 ml/m  RA Volume:   85.30 ml  39.57 ml/m LA Vol (A4C):   39.7 ml 18.42 ml/m LA Biplane Vol: 55.4 ml 25.70 ml/m  AORTIC VALVE                    PULMONIC VALVE AV Area (Vmax):    3.43 cm     PV Vmax:       0.99 m/s AV Area (Vmean):   3.40 cm     PV Peak grad:  3.9 mmHg AV Area (VTI):     3.32 cm AV Vmax:           115.00 cm/s AV Vmean:          74.700 cm/s AV VTI:            0.220 m AV Peak Grad:      5.3 mmHg AV Mean Grad:      3.0 mmHg LVOT Vmax:         114.00 cm/s LVOT Vmean:        73.400 cm/s LVOT VTI:          0.211 m LVOT/AV VTI ratio: 0.96  AORTA Ao Root diam: 3.40 cm Ao Asc diam:  3.90 cm MITRAL VALVE                TRICUSPID VALVE MV Area (PHT): 5.02 cm    TR Peak grad:   45.7 mmHg MV Decel Time: 151 msec    TR Vmax:        338.00 cm/s MV E velocity: 96.40 cm/s MV A velocity: 40.10 cm/s  SHUNTS MV E/A ratio:  2.40        Systemic VTI:  0.21 m                            Systemic Diam: 2.10 cm Rachelle Hora Blakeleigh Domek MD Electronically signed by Thurmon Fair MD Signature Date/Time: 07/01/2023/12:10:46 PM    Final    CT Angio Chest/Abd/Pel for Dissection W and/or Wo Contrast  Result Date: 07/01/2023 CLINICAL DATA:  Acute aortic syndrome suspected. Chest pain, ablation on Friday. EXAM: CT ANGIOGRAPHY CHEST, ABDOMEN AND PELVIS TECHNIQUE: Non-contrast CT of the chest was initially obtained. Multidetector CT imaging through the chest, abdomen and pelvis was performed using the standard protocol during bolus administration of intravenous contrast. Multiplanar reconstructed images and MIPs were obtained and reviewed to evaluate the vascular anatomy. RADIATION DOSE REDUCTION: This exam was performed according to the departmental dose-optimization program which includes automated exposure control, adjustment of the mA and/or kV according to patient size and/or use of iterative reconstruction technique. CONTRAST:  OMNIPAQUE IOHEXOL 350 MG/ML SOLN COMPARISON:  04/11/2022. FINDINGS: CTA CHEST FINDINGS Cardiovascular: The heart is normal in size and there is a small pericardial effusion. Pacemaker leads are noted in the heart. Scattered coronary artery calcifications are present. There is atherosclerotic calcification of the aorta without evidence of aneurysm or dissection. The pulmonary trunk is mildly distended suggesting underlying pulmonary artery hypertension. Mediastinum/Nodes: No mediastinal, hilar, or axillary lymphadenopathy. The thyroid gland, trachea, and esophagus are within normal limits. Lungs/Pleura: Biapical pleural and parenchymal scarring is noted bilaterally. Mild bronchial wall thickening is noted bilaterally. Focal  consolidation is noted in the left lower lobe, axial image 120. Atelectasis is present bilaterally. No effusion or pneumothorax. A 4 mm nodule is noted in the right middle lobe, axial image 97. There is a 4 mm nodule in the right middle lobe, axial image 110. Musculoskeletal: Degenerative changes are present in the thoracic spine. No acute or suspicious osseous abnormality. A pacemaker device is noted in the anterior chest wall on the left. Review of the MIP images confirms the above findings. CTA ABDOMEN AND PELVIS FINDINGS VASCULAR Aorta: Normal caliber aorta without aneurysm, dissection, vasculitis or significant stenosis. Aortic atherosclerosis. Celiac: Patent without evidence of aneurysm, dissection, vasculitis or significant stenosis. SMA: Patent without evidence of aneurysm, dissection, vasculitis or significant stenosis. Renals: Both renal arteries are patent without evidence of aneurysm, dissection, vasculitis, fibromuscular dysplasia or significant stenosis. IMA: Patent without evidence of aneurysm, dissection, vasculitis or significant stenosis. Inflow: Patent without evidence of aneurysm, dissection, vasculitis or significant stenosis. Veins: No obvious venous abnormality within the limitations of this arterial phase study. Review of the MIP images confirms the above findings. NON-VASCULAR Hepatobiliary: No focal liver abnormality is seen. Status post cholecystectomy. No biliary dilatation. Pancreas: Unremarkable. No pancreatic ductal dilatation or surrounding inflammatory changes. Spleen: Normal in size without focal abnormality. Adrenals/Urinary Tract: The adrenal glands are within normal limits. The kidneys enhance symmetrically. Cysts are present in the kidneys bilaterally. Nonobstructive renal calculi are noted on the left. No hydronephrosis bilaterally. The bladder is not seen due to streak artifact. Stomach/Bowel: Stomach is within normal limits. Appendix appears normal. No evidence of bowel wall  thickening, distention, or inflammatory changes. No free air or pneumatosis. Scattered diverticula are present along the colon without evidence of diverticulitis. Lymphatic: No abdominal or pelvic lymphadenopathy. Reproductive: Prostate gland is not seen due to hardware artifact. A penile prosthesis is noted with reservoir in the low anterior pelvis. Other: No abdominopelvic ascites. Musculoskeletal: Degenerative changes are present in the lumbar spine. Spinal fusion hardware is present from L3-L5. Total hip arthroplasty changes are present bilaterally. No acute osseous abnormality. Review of the MIP images confirms the above findings. IMPRESSION: 1. No evidence of aortic aneurysm or dissection. 2. Focal consolidation in the right lower lobe, possible pneumonia. Follow-up is recommended until resolution. 3. Small pericardial effusion with coronary artery calcifications. 4. 4 mm right lower lobe  pulmonary nodules. No follow-up needed if patient is low-risk (and has no known or suspected primary neoplasm). Non-contrast chest CT can be considered in 12 months if patient is high-risk. This recommendation follows the consensus statement: Guidelines for Management of Incidental Pulmonary Nodules Detected on CT Images: From the Fleischner Society 2017; Radiology 2017; 284:228-243. 5. Nonobstructive left renal calculi. Electronically Signed   By: Thornell Sartorius M.D.   On: 07/01/2023 03:47   DG Chest 2 View  Result Date: 07/01/2023 CLINICAL DATA:  Chest pain. EXAM: CHEST - 2 VIEW COMPARISON:  05/30/2022. FINDINGS: The heart is enlarged and the mediastinal contour is within normal limits. Hyperinflation of the lungs is noted. Patchy airspace disease is noted at the lung bases bilaterally, greater on the left than on the right. No effusion or pneumothorax. Apical pleural thickening is noted bilaterally. A dual lead pacemaker is present over the left chest. No acute osseous abnormality. IMPRESSION: 1. Patchy airspace disease  at the lung bases, suspicious for developing pneumonia. 2. Cardiomegaly. Electronically Signed   By: Thornell Sartorius M.D.   On: 07/01/2023 02:47    Cardiac Studies   Echo 07/01/23: 1. Left ventricular ejection fraction, by estimation, is 60 to 65%. The  left ventricle has normal function. The left ventricle has no regional  wall motion abnormalities. Left ventricular diastolic parameters were  normal.   2. Right ventricular systolic function is normal. The right ventricular  size is normal. There is severely elevated pulmonary artery systolic  pressure.   3. Left atrial size was mildly dilated.   4. Right atrial size was mildly dilated.   5. There is no evidence of cardiac tamponade.   6. The mitral valve is normal in structure. No evidence of mitral valve  regurgitation. No evidence of mitral stenosis.   7. Tricuspid valve regurgitation is mild to moderate.   8. The aortic valve is tricuspid. There is mild calcification of the  aortic valve. Aortic valve regurgitation is not visualized. Aortic valve  sclerosis is present, with no evidence of aortic valve stenosis.   9. There is borderline dilatation of the ascending aorta, measuring 39  mm.  10. The inferior vena cava is dilated in size with <50% respiratory  variability, suggesting right atrial pressure of 15 mmHg.    Patient Profile     87 y.o. male with history of atrial fibrillation status post recent ablation who is being seen 07/01/2023 for the evaluation of pleuritic chest pain.   Assessment & Plan     Post procedural pericarditis Chest pain following ablation - CP better after toradol - sed rate 3, CRP 2.8 - started 0.6 mg colchicine yesterday - he reports improved chest pain this morning but has not yet walked in the hall   Anemia - Hb 8.5 - no large effusion on echo - will repeat a stat CBC this morning - no signs of active GI bleeding   Atrial fibrillation - s/p second Afib ablation - he remains in sinus  rhythm on telemetry - amiodarone and cardizem both listed on home medications prior to admission, pt states he is not taking either agent, will discontinue these on his med list   Chronic anticoagulation - on home eliquis dose - anemia as above - CBC pending   SSS s/p dual chamber PPM Normal function on last interrogation 05/2023, not pacing on telemetry   ?PNA Focal consolidation in RLL on CT chest - unclear if related to PNA - received one dose of doxycycline -  pt denies SOB or lateral chest pain - crackles on left base on exam - has received 20 mg lasix x 2 doses - leukocytosis of 12.6 yesterday, repeat CBC pending   If Hb stable or improved, consider discharging home with colchicine 0.6 mg x 3 months. Will hold off on ibuprofen given eliquis - if ibuprofen needed, would add PPI.    For questions or updates, please contact [facility] Please consult [email or web address] for contact info under        Signed, Marcelino Duster, PA  07/02/2023, 8:02 AM     I have seen and examined the patient along with Marcelino Duster, PA .  I have reviewed the chart, notes and new data.  I agree with PA/NP's note.  Key new complaints: Chest pain markedly improved.  Only present with very deep breathing. Key examination changes: No pericardial rub cardiac exam.  Remains in regular rhythm.  A few crackles in the left lung base. Key new findings / data: Maintaining normal sinus rhythm on telemetry.  Repeat CBC pending.  PLAN: Acute postprocedural pericarditis, improved with a single dose of intravenous NSAIDs and oral colchicine.  No longer requiring opiates (which cause severe nausea and vomiting). Also with improved signs of hypervolemia. He has been afebrile has a very mild increase in WBC. He is not coughing. Not sure of the significance of the very limited area of focal consolidation in the left lower lobe.  I do not think antibiotics are indicated.  Plan discharge today,  assuming there is no evidence of further drop in hemoglobin levels.  Thurmon Fair, MD, [facility] [phone] 07/02/2023, 9:01 AM

## 2023-07-03 ENCOUNTER — Encounter (HOSPITAL_COMMUNITY): Payer: Self-pay | Admitting: Cardiology

## 2023-07-05 MED FILL — Cefazolin Sodium-Dextrose IV Solution 2 GM/100ML-4%: INTRAVENOUS | Qty: 100 | Status: AC

## 2023-07-05 NOTE — Addendum Note (Signed)
Addended by: Baird Lyons on: 07/05/2023 06:00 PM   Modules accepted: Orders

## 2023-07-06 ENCOUNTER — Ambulatory Visit: Payer: PPO | Attending: Physician Assistant

## 2023-07-06 DIAGNOSIS — D539 Nutritional anemia, unspecified: Secondary | ICD-10-CM

## 2023-07-09 NOTE — Anesthesia Postprocedure Evaluation (Signed)
Anesthesia Post Note  Patient: Todd Salazar  Procedure(s) Performed: ATRIAL FIBRILLATION ABLATION     Patient location during evaluation: Cath Lab Anesthesia Type: General Level of consciousness: awake Pain management: pain level controlled Vital Signs Assessment: post-procedure vital signs reviewed and stable Respiratory status: spontaneous breathing, nonlabored ventilation and respiratory function stable Cardiovascular status: blood pressure returned to baseline and stable Postop Assessment: no apparent nausea or vomiting Anesthetic complications: no   There were no known notable events for this encounter.  Last Vitals:  Vitals:   06/29/23 1905 06/29/23 1910  BP: 106/62 110/62  Pulse: 89 87  Resp:    Temp:    SpO2: 92% 93%    Last Pain:  Vitals:   06/30/23 1424  TempSrc:   PainSc: 0-No pain                 Jolena Kittle P Gissel Keilman

## 2023-07-10 ENCOUNTER — Inpatient Hospital Stay (HOSPITAL_COMMUNITY)
Admission: EM | Admit: 2023-07-10 | Discharge: 2023-07-13 | DRG: 315 | Disposition: A | Discharge status: Home / Self Care | Payer: PPO | Attending: Internal Medicine | Admitting: Internal Medicine

## 2023-07-10 ENCOUNTER — Encounter (HOSPITAL_COMMUNITY): Payer: Self-pay | Admitting: Emergency Medicine

## 2023-07-10 ENCOUNTER — Emergency Department (HOSPITAL_COMMUNITY): Payer: PPO

## 2023-07-10 ENCOUNTER — Telehealth: Payer: Self-pay | Admitting: Cardiology

## 2023-07-10 ENCOUNTER — Other Ambulatory Visit: Payer: Self-pay

## 2023-07-10 DIAGNOSIS — R52 Pain, unspecified: Secondary | ICD-10-CM

## 2023-07-10 DIAGNOSIS — N1831 Chronic kidney disease, stage 3a: Secondary | ICD-10-CM | POA: Diagnosis present

## 2023-07-10 DIAGNOSIS — Z9049 Acquired absence of other specified parts of digestive tract: Secondary | ICD-10-CM

## 2023-07-10 DIAGNOSIS — Z79899 Other long term (current) drug therapy: Secondary | ICD-10-CM

## 2023-07-10 DIAGNOSIS — I129 Hypertensive chronic kidney disease with stage 1 through stage 4 chronic kidney disease, or unspecified chronic kidney disease: Secondary | ICD-10-CM | POA: Diagnosis present

## 2023-07-10 DIAGNOSIS — J189 Pneumonia, unspecified organism: Secondary | ICD-10-CM

## 2023-07-10 DIAGNOSIS — I2510 Atherosclerotic heart disease of native coronary artery without angina pectoris: Secondary | ICD-10-CM | POA: Diagnosis not present

## 2023-07-10 DIAGNOSIS — Z87891 Personal history of nicotine dependence: Secondary | ICD-10-CM

## 2023-07-10 DIAGNOSIS — Z888 Allergy status to other drugs, medicaments and biological substances status: Secondary | ICD-10-CM

## 2023-07-10 DIAGNOSIS — Z7901 Long term (current) use of anticoagulants: Secondary | ICD-10-CM

## 2023-07-10 DIAGNOSIS — I308 Other forms of acute pericarditis: Secondary | ICD-10-CM | POA: Diagnosis not present

## 2023-07-10 DIAGNOSIS — I480 Paroxysmal atrial fibrillation: Secondary | ICD-10-CM | POA: Diagnosis present

## 2023-07-10 DIAGNOSIS — Z882 Allergy status to sulfonamides status: Secondary | ICD-10-CM

## 2023-07-10 DIAGNOSIS — Z881 Allergy status to other antibiotic agents status: Secondary | ICD-10-CM

## 2023-07-10 DIAGNOSIS — Z8249 Family history of ischemic heart disease and other diseases of the circulatory system: Secondary | ICD-10-CM

## 2023-07-10 DIAGNOSIS — I441 Atrioventricular block, second degree: Secondary | ICD-10-CM | POA: Diagnosis present

## 2023-07-10 DIAGNOSIS — I495 Sick sinus syndrome: Secondary | ICD-10-CM | POA: Diagnosis present

## 2023-07-10 DIAGNOSIS — I4510 Unspecified right bundle-branch block: Secondary | ICD-10-CM | POA: Diagnosis present

## 2023-07-10 DIAGNOSIS — R0789 Other chest pain: Secondary | ICD-10-CM

## 2023-07-10 DIAGNOSIS — J188 Other pneumonia, unspecified organism: Secondary | ICD-10-CM

## 2023-07-10 DIAGNOSIS — Z885 Allergy status to narcotic agent status: Secondary | ICD-10-CM

## 2023-07-10 DIAGNOSIS — I4819 Other persistent atrial fibrillation: Secondary | ICD-10-CM | POA: Diagnosis present

## 2023-07-10 DIAGNOSIS — R1110 Vomiting, unspecified: Secondary | ICD-10-CM | POA: Insufficient documentation

## 2023-07-10 DIAGNOSIS — Z8546 Personal history of malignant neoplasm of prostate: Secondary | ICD-10-CM

## 2023-07-10 DIAGNOSIS — Z1152 Encounter for screening for COVID-19: Secondary | ICD-10-CM

## 2023-07-10 DIAGNOSIS — Z950 Presence of cardiac pacemaker: Secondary | ICD-10-CM

## 2023-07-10 DIAGNOSIS — I319 Disease of pericardium, unspecified: Secondary | ICD-10-CM | POA: Diagnosis not present

## 2023-07-10 DIAGNOSIS — D649 Anemia, unspecified: Principal | ICD-10-CM

## 2023-07-10 DIAGNOSIS — I3139 Other pericardial effusion (noninflammatory): Secondary | ICD-10-CM | POA: Diagnosis present

## 2023-07-10 DIAGNOSIS — R112 Nausea with vomiting, unspecified: Secondary | ICD-10-CM

## 2023-07-10 LAB — BASIC METABOLIC PANEL
Anion gap: 11 (ref 5–15)
BUN: 15 mg/dL (ref 8–23)
CO2: 24 mmol/L (ref 22–32)
Calcium: 8.7 mg/dL — ABNORMAL LOW (ref 8.9–10.3)
Chloride: 109 mmol/L (ref 98–111)
Creatinine, Ser: 1.2 mg/dL (ref 0.61–1.24)
GFR, Estimated: 59 mL/min — ABNORMAL LOW (ref >60)
Glucose, Bld: 107 mg/dL — ABNORMAL HIGH (ref 70–99)
Potassium: 4.2 mmol/L (ref 3.5–5.1)
Sodium: 144 mmol/L (ref 135–145)

## 2023-07-10 LAB — I-STAT CG4 LACTIC ACID, ED: Lactic Acid, Venous: 0.8 mmol/L (ref 0.5–1.9)

## 2023-07-10 LAB — CBC
HCT: 25.5 % — ABNORMAL LOW (ref 39.0–52.0)
Hemoglobin: 8.1 g/dL — ABNORMAL LOW (ref 13.0–17.0)
MCH: 33.1 pg (ref 26.0–34.0)
MCHC: 31.8 g/dL (ref 30.0–36.0)
MCV: 104.1 fL — ABNORMAL HIGH (ref 80.0–100.0)
Platelets: 291 10*3/uL (ref 150–400)
RBC: 2.45 MIL/uL — ABNORMAL LOW (ref 4.22–5.81)
RDW: 25.8 % — ABNORMAL HIGH (ref 11.5–15.5)
WBC: 4.2 10*3/uL (ref 4.0–10.5)
nRBC: 0.7 % — ABNORMAL HIGH (ref 0.0–0.2)

## 2023-07-10 LAB — TROPONIN I (HIGH SENSITIVITY)
Troponin I (High Sensitivity): 10 ng/L (ref <18)
Troponin I (High Sensitivity): 10 ng/L (ref <18)

## 2023-07-10 MED ORDER — SODIUM CHLORIDE 0.9 % IV SOLN
1.0000 g | Freq: Once | INTRAVENOUS | Status: AC
  Administered 2023-07-10: 1 g via INTRAVENOUS
  Filled 2023-07-10: qty 10

## 2023-07-10 MED ORDER — MORPHINE SULFATE (PF) 4 MG/ML IV SOLN
4.0000 mg | Freq: Once | INTRAVENOUS | Status: AC
  Administered 2023-07-10: 4 mg via INTRAVENOUS
  Filled 2023-07-10: qty 1

## 2023-07-10 MED ORDER — IOHEXOL 350 MG/ML SOLN
75.0000 mL | Freq: Once | INTRAVENOUS | Status: AC | PRN
  Administered 2023-07-10: 75 mL via INTRAVENOUS

## 2023-07-10 MED ORDER — HYDROMORPHONE HCL 1 MG/ML IJ SOLN
1.0000 mg | Freq: Once | INTRAMUSCULAR | Status: AC
  Administered 2023-07-10: 1 mg via INTRAVENOUS
  Filled 2023-07-10: qty 1

## 2023-07-10 MED ORDER — SODIUM CHLORIDE 0.9 % IV SOLN
500.0000 mg | Freq: Once | INTRAVENOUS | Status: AC
  Administered 2023-07-11: 500 mg via INTRAVENOUS
  Filled 2023-07-10: qty 5

## 2023-07-10 NOTE — H&P (Signed)
History and Physical    Patient: Todd Salazar DOB: [birth year] DOA: 07/10/2023 DOS: the patient was seen and examined on 07/11/2023 PCP: Patient, No Pcp Per  Patient coming from: Home  Chief Complaint:  Chief Complaint  Patient presents with   Chest Pain   HPI: Todd Salazar is a 87 y.o. male with medical history significant of CAD, paroxysmal atrial fibrillation on Eliquis, HTN Tachycardia-bradycardia syndrome s/p pacemaker, CKD3a, restrictive lung disease who presents with chest pain.   Pt had re-do ablation for atrial fibrillation on 7/18 and subsequently readmitted on 7/20 for chest pain found to be post procedure pericarditis. Echo showed trivial pericardial effusion.Treated with NSAIDS and placed on colchicine for 3 months. CTA chest at the time showed focal consolidation in right lower lobe possible pneumonia. However antibiotics was deferred since leukocytosis though more related to pericarditis. He was also given IV lasix 20mg  x2 with improve to bibasilar crackles.   He presents today with worsening persistent chest pain. Chest pain is mid-sternal and non-radiating. Worse with deep respirations. Better when he lays on his side and holds a pillow to his chest. No cough. Has some increasing shortness of breath. No fever. Initially denies nausea but had an episode of severe vomiting shortly after during my evaluation. He thinks maybe due to IV contrast or the opioids he has received. He took just two doses of Aleve for pain today. No alcohol use. He is s/p cholecystectomy. Had bowel movement earlier today.  Troponin reassuring at 10 down from 505 on 7/20.  EKG with SR, incomplete RBBB not significantly changed from prior.   CTA chest negative for PE, shows likely improving multifocal pneumonia with persistent lingular airspace opacity.   Patient started on IV Rocephin and Azithromcyin for pneumonia and given IV opioids for pain. Hospitalist consulted for admission.   Review  of Systems: As mentioned in the history of present illness. All other systems reviewed and are negative. Past Medical History:  Diagnosis Date   Arthritis    Atrial fibrillation (HCC)    Cancer (HCC)    prostate   Dysrhythmia    afib   History of kidney stones    Past Surgical History:  Procedure Laterality Date   ATRIAL FIBRILLATION ABLATION N/A 10/29/2021   Procedure: ATRIAL FIBRILLATION ABLATION;  Surgeon: Regan Lemming, MD;  Location: [facility];  Service: Cardiovascular;  Laterality: N/A;   ATRIAL FIBRILLATION ABLATION N/A 06/29/2023   Procedure: ATRIAL FIBRILLATION ABLATION;  Surgeon: Regan Lemming, MD;  Location: [facility] INVASIVE CV LAB;  Service: Cardiovascular;  Laterality: N/A;   Bone spur     CARDIOVERSION N/A 06/01/2022   Procedure: CARDIOVERSION;  Surgeon: Little Ishikawa, MD;  Location[facility] ENDOSCOPY;  Service: Cardiovascular;  Laterality: N/A;   CHOLECYSTECTOMY     HERNIA REPAIR     JOINT REPLACEMENT     kidney stones     LAMINECTOMY WITH POSTERIOR LATERAL ARTHRODESIS LEVEL 1 Right 11/25/2019   Procedure: Laminectomy and Foraminotomy  Lumbar Four-Lumbar Five - right, with instrumented fusion;  Surgeon: Tia Alert, MD;  Location: [facility] OR;  Service: Neurosurgery;  Laterality: Right;  Laminectomy and Foraminotomy  Lumbar Four-Lumbar Five - right, with instrumented fusion   PROSTATE SURGERY     TEE WITHOUT CARDIOVERSION N/A 06/01/2022   Procedure: TRANSESOPHAGEAL ECHOCARDIOGRAM (TEE);  Surgeon: Little Ishikawa, MD;  Location: [facility] ENDOSCOPY;  Service: Cardiovascular;  Laterality: N/A;   Social History:  reports that he quit smoking about 26 years ago.  His smoking use included cigarettes. He has never used smokeless tobacco. He reports that he does not drink alcohol and does not use drugs.  Allergies  Allergen Reactions   Ciprofloxacin Anaphylaxis and Other (See Comments)    Dizziness, also   Tramadol Swelling and Other (See Comments)     Laryngeal edema   Duloxetine Other (See Comments)    Delusions    Hydrocodone-Acetaminophen Nausea And Vomiting   Nisoldipine Other (See Comments)    Pt unsure of reaction    Benadryl [Diphenhydramine] Anxiety and Other (See Comments)    Panic attack    Methocarbamol Nausea Only   Ondansetron Hcl Nausea Only   Sulfa Antibiotics Rash    Family History  Problem Relation Age of Onset   Hypertension Father    CAD Neg Hx    Heart failure Neg Hx    Stroke Neg Hx     Prior to Admission medications   Medication Sig Start Date End Date Taking? Authorizing Provider  acetaminophen (TYLENOL) 500 MG tablet Take 1,000 mg by mouth as needed for moderate pain.    [provider]  Calcium Carbonate Antacid (TUMS PO) Take 4 tablets by mouth as needed (discomfort).    [provider]  colchicine 0.6 MG tablet Take 1 tablet (0.6 mg total) by mouth daily. 07/03/23   [facility]uke, Roe Rutherford, PA  ELIQUIS 5 MG TABS tablet TAKE 1 TABLET BY MOUTH 2 TIMES A DAY 04/11/23   Camnitz, Andree Coss, MD  gabapentin (NEURONTIN) 100 MG capsule Take 100 mg by mouth See admin instructions. Take with 300 mg for a total of 400 mg at bedtime 01/24/22   [provider]  gabapentin (NEURONTIN) 300 MG capsule Take 300 mg by mouth See admin instructions. Take with 100 mg for a total of 400 mg at bedtime    [provider]  nitroGLYCERIN (NITROSTAT) 0.4 MG SL tablet Place 0.4 mg under the tongue every 5 (five) minutes as needed for chest pain. 03/18/22   [provider]    Physical Exam: Vitals:   07/10/23 2122 07/10/23 2130 07/10/23 2337 07/10/23 2356  BP: (!) 92/52 (!) 100/47  118/61  Pulse: 68 67  64  Resp: 11 12  16   Temp:   97.8 F (36.6 C)   TempSrc:   Oral   SpO2: 97% 99%  97%  Weight:      Height:       Constitutional: NAD, calm, comfortable, well-appearing elderly male appearing much younger than stated age laying in bed Eyes: lids and conjunctivae normal ENMT: Mucous  membranes are moist.  Neck: normal, supple Respiratory: clear to auscultation bilaterally, no wheezing, no crackles. Normal respiratory effort. No accessory muscle use.  Cardiovascular: Regular rate and rhythm, no murmurs / rubs / gallops. No extremity edema.  No reproducible chest wall pain. Abdomen: no epigastric tenderness or any tenderness thoughout, nondistended, no rebound tenderness or guarding, bowel sounds positive.  Musculoskeletal: no clubbing / cyanosis. No joint deformity upper and lower extremities. Good ROM, no contractures. Normal muscle tone.  Skin: no rashes, lesions, ulcers. No induration Neurologic: CN 2-12 grossly intact. Strength 5/5 in all 4.  Psychiatric: Normal judgment and insight. Alert and oriented x 3. Normal mood.   Data Reviewed:  See HPI  Assessment and Plan: * Multifocal pneumonia -seen on CT chest on 7/20 and repeat CTA chest today shows resolving multifocal pneumonia -continue IV Rocephin and Azithromycin   CAD (coronary artery disease), native coronary artery -on Eliquis  Paroxysmal atrial fibrillation (HCC) -Currently in sinus rhythm s/p ablation x2 -continue Eliquis  Tachycardia-bradycardia syndrome (HCC) -s/p PPM  Pericarditis -post procedure pericarditis following ablation for a.fib on 7/18 -echo on 7/20 with trivial pericardial effusion -troponin and EKG today is reassuring  -continue colchicine   Vomiting -Had new acute episode of vomiting in ED. Possibly from IV opioids -will check LFTs and lipase. He has hx of cholecystectomy. No alcohol use. Only took occasional NSAIDS this week.  -PRN antiemetics      Advance Care Planning:   Code Status: Full Code   Consults: none  Family Communication: none at bedside  Severity of Illness: The appropriate patient status for this patient is OBSERVATION. Observation status is judged to be reasonable and necessary in order to provide the required intensity of service to ensure the patient's  safety. The patient's presenting symptoms, physical exam findings, and initial radiographic and laboratory data in the context of their medical condition is felt to place them at decreased risk for further clinical deterioration. Furthermore, it is anticipated that the patient will be medically stable for discharge from the hospital within 2 midnights of admission.   Author: Anselm Jungling, DO 07/11/2023 12:59 AM  For on call review [email or web address].

## 2023-07-10 NOTE — Telephone Encounter (Signed)
Spoke with pt's daughter,DPR with pt in the background of phone call yelling and moaning in pain..  She states pt is grabbing his chest and trying to take nitroglycerin.  RN advised pt's daughter to discontinue call now and call 911 immediately.  Pt's daughter verbalizes understanding and agrees to call 911 now.

## 2023-07-10 NOTE — H&P (Incomplete)
History and Physical    Patient: Todd Salazar XLK:[id number] DOB: 13-Jun-1936 DOA: 07/10/2023 DOS: the patient was seen and examined on 07/10/2023 PCP: Patient, No Pcp Per  Patient coming from: Home  Chief Complaint:  Chief Complaint  Patient presents with  . Chest Pain   HPI: Todd Salazar is a 87 y.o. male with medical history significant of CAD, paroxysmal atrial fibrillation on Eliquis, HTN Tachycardia-bradycardia syndrome s/p pacemaker, CKD3a, restrictive lung disease who presents with chest pain.   Pt had re-do ablation for atrial fibrillation on 7/18 and subsequently readmitted on 7/20 for chest pain f Review of Systems: {ROS_Text:26778} Past Medical History:  Diagnosis Date  . Arthritis   . Atrial fibrillation (HCC)   . Cancer [facility])    prostate  . Dysrhythmia    afib  . History of kidney stones    Past Surgical History:  Procedure Laterality Date  . ATRIAL FIBRILLATION ABLATION N/A 10/29/2021   Procedure: ATRIAL FIBRILLATION ABLATION;  Surgeon: Regan Lemming, MD;  Location: [facility] INVASIVE CV LAB;  Service: Cardiovascular;  Laterality: N/A;  . ATRIAL FIBRILLATION ABLATION N/A 06/29/2023   Procedure: ATRIAL FIBRILLATION ABLATION;  Surgeon: Regan Lemming, MD;  Location: [facility] INVASIVE CV LAB;  Service: Cardiovascular;  Laterality: N/A;  . Bone spur    . CARDIOVERSION N/A 06/01/2022   Procedure: CARDIOVERSION;  Surgeon: Little Ishikawa, MD;  Location: [facility] ENDOSCOPY;  Service: Cardiovascular;  Laterality: N/A;  . CHOLECYSTECTOMY    . HERNIA REPAIR    . JOINT REPLACEMENT    . kidney stones    . LAMINECTOMY WITH POSTERIOR LATERAL ARTHRODESIS LEVEL 1 Right 11/25/2019   Procedure: Laminectomy and Foraminotomy  Lumbar Four-Lumbar Five - right, with instrumented fusion;  Surgeon: Tia Alert, MD;  Location: [facility] OR;  Service: Neurosurgery;  Laterality: Right;  Laminectomy and Foraminotomy  Lumbar Four-Lumbar Five - right, with instrumented fusion  . PROSTATE SURGERY     . TEE WITHOUT CARDIOVERSION N/A 06/01/2022   Procedure: TRANSESOPHAGEAL ECHOCARDIOGRAM (TEE);  Surgeon: Little Ishikawa, MD;  Location: [facility] ENDOSCOPY;  Service: Cardiovascular;  Laterality: N/A;   Social History:  reports that he quit smoking about 26 years ago. His smoking use included cigarettes. He has never used smokeless tobacco. He reports that he does not drink alcohol and does not use drugs.  Allergies  Allergen Reactions  . Ciprofloxacin Anaphylaxis and Other (See Comments)    Dizziness, also  . Tramadol Swelling and Other (See Comments)    Laryngeal edema  . Duloxetine Other (See Comments)    Delusions   . Hydrocodone-Acetaminophen Nausea And Vomiting  . Nisoldipine Other (See Comments)    Pt unsure of reaction   . Benadryl [Diphenhydramine] Anxiety and Other (See Comments)    Panic attack   . Methocarbamol Nausea Only  . Ondansetron Hcl Nausea Only  . Sulfa Antibiotics Rash    Family History  Problem Relation Age of Onset  . Hypertension Father   . CAD Neg Hx   . Heart failure Neg Hx   . Stroke Neg Hx     Prior to Admission medications   Medication Sig Start Date End Date Taking? Authorizing Provider  acetaminophen (TYLENOL) 500 MG tablet Take 1,000 mg by mouth as needed for moderate pain.    [provider]  Calcium Carbonate Antacid (TUMS PO) Take 4 tablets by mouth as needed (discomfort).    [provider]  colchicine 0.6 MG tablet Take 1 tablet (0.6 mg total) by mouth daily.  07/03/23   [facility], Roe Rutherford, PA  ELIQUIS 5 MG TABS tablet TAKE 1 TABLET BY MOUTH 2 TIMES A DAY 04/11/23   Camnitz, Andree Coss, MD  gabapentin (NEURONTIN) 100 MG capsule Take 100 mg by mouth See admin instructions. Take with 300 mg for a total of 400 mg at bedtime 01/24/22   [provider]  gabapentin (NEURONTIN) 300 MG capsule Take 300 mg by mouth See admin instructions. Take with 100 mg for a total of 400 mg at bedtime    [provider]   nitroGLYCERIN (NITROSTAT) 0.4 MG SL tablet Place 0.4 mg under the tongue every 5 (five) minutes as needed for chest pain. 03/18/22   [provider]    Physical Exam: Vitals:   07/10/23 2122 07/10/23 2130 07/10/23 2337 07/10/23 2356  BP: (!) 92/52 (!) 100/47  118/61  Pulse: 68 67  64  Resp: 11 12  16   Temp:   97.8 F (36.6 C)   TempSrc:   Oral   SpO2: 97% 99%  97%  Weight:      Height:       *** Data Reviewed: {Tip this will not be part of the note when signed- Document your independent interpretation of telemetry tracing, EKG, lab, Radiology test or any other diagnostic tests. Add any new diagnostic test ordered today. (Optional):26781} {Results:26384}  Assessment and Plan: No notes have been filed under this hospital service. Service: Hospitalist     Advance Care Planning:   Code Status: Prior ***  Consults: ***  Family Communication: ***  Severity of Illness: {Observation/Inpatient:21159}  Author: Anselm Jungling, DO 07/10/2023 11:59 PM  For on call review [email or web address].

## 2023-07-10 NOTE — ED Triage Notes (Signed)
Pt arrives via EMS from home with reports of chest pain with any movement since having a ablation 3 weeks ago.

## 2023-07-10 NOTE — ED Notes (Signed)
Phlebotomy is coming to draw blood cultures, holding antibiotics until first set is drawn.

## 2023-07-10 NOTE — Telephone Encounter (Signed)
Pt c/o of Chest Pain: STAT if CP now or developed within 24 hours  1. Are you having CP right now?  Yes, patient's daughter states the patient is hugging a pillow in pain right now  2. Are you experiencing any other symptoms (ex. SOB, nausea, vomiting, sweating)?  Right knee pain    3. How long have you been experiencing CP?  Has been going on the past 2 weeks, got significantly worse yesterday  4. Is your CP continuous or coming and going?  Continuous   5. Have you taken Nitroglycerin?  No  ?

## 2023-07-10 NOTE — ED Provider Notes (Signed)
[facility] [place] [facility]ERGENCY DEPARTMENT AT [facility] Provider Note   CSN: [id number] Arrival date & time: 07/10/23  1802     History  Chief Complaint  Patient presents with   Chest Pain    Todd Salazar is a 87 y.o. male.  Pt is a 87 yo male with pmhx significant for afib (on eliquis), prostate cancer, tachy-brady syndrome s/p pacemaker, and CKD.  Pt failed prior treatment for afib, so he had a cardioversion on 7/18.  He has been having sharp cp since procedure.  He was admitted from 7/20-21 for pericarditis and was d/c with colchicine.  He has continued to have cp.  Today, the pain was severe.         Home Medications Prior to Admission medications   Medication Sig Start Date End Date Taking? Authorizing Provider  acetaminophen (TYLENOL) 500 MG tablet Take 1,000 mg by mouth as needed for moderate pain.    [provider]  Calcium Carbonate Antacid (TUMS PO) Take 4 tablets by mouth as needed (discomfort).    [provider]  colchicine 0.6 MG tablet Take 1 tablet (0.6 mg total) by mouth daily. 07/03/23   [facility], Roe Rutherford, PA  ELIQUIS 5 MG TABS tablet TAKE 1 TABLET BY MOUTH 2 TIMES A DAY 04/11/23   Camnitz, Andree Coss, MD  gabapentin (NEURONTIN) 100 MG capsule Take 100 mg by mouth See admin instructions. Take with 300 mg for a total of 400 mg at bedtime 01/24/22   [provider]  gabapentin (NEURONTIN) 300 MG capsule Take 300 mg by mouth See admin instructions. Take with 100 mg for a total of 400 mg at bedtime    [provider]  nitroGLYCERIN (NITROSTAT) 0.4 MG SL tablet Place 0.4 mg under the tongue every 5 (five) minutes as needed for chest pain. 03/18/22   [provider]      Allergies    Ciprofloxacin, Tramadol, Duloxetine, Hydrocodone-acetaminophen, Nisoldipine, Benadryl [diphenhydramine], Methocarbamol, Ondansetron hcl, and Sulfa antibiotics    Review of Systems   Review of Systems  Cardiovascular:  Positive for chest  pain.  All other systems reviewed and are negative.   Physical Exam Updated Vital Signs BP (!) 100/47 (BP Location: Right Arm)   Pulse 67   Temp 98 F (36.7 C) (Oral)   Resp 12   Ht 6\' 3"  (1.905 m)   Wt 90 kg   SpO2 99%   BMI 24.80 kg/m  Physical Exam Vitals and nursing note reviewed.  Constitutional:      Appearance: He is well-developed.  HENT:     Head: Normocephalic and atraumatic.  Eyes:     Extraocular Movements: Extraocular movements intact.     Pupils: Pupils are equal, round, and reactive to light.  Cardiovascular:     Rate and Rhythm: Normal rate and regular rhythm.     Heart sounds: Normal heart sounds.  Pulmonary:     Effort: Pulmonary effort is normal.     Breath sounds: Normal breath sounds.  Abdominal:     General: Bowel sounds are normal.     Palpations: Abdomen is soft.  Musculoskeletal:        General: Normal range of motion.     Cervical back: Normal range of motion and neck supple.  Skin:    General: Skin is warm.     Capillary Refill: Capillary refill takes less than 2 seconds.  Neurological:     General: No focal deficit present.     Mental  Status: He is alert and oriented to person, place, and time.  Psychiatric:        Mood and Affect: Mood normal.        Behavior: Behavior normal.     ED Results / Procedures / Treatments   Labs (all labs ordered are listed, but only abnormal results are displayed) Labs Reviewed  BASIC METABOLIC PANEL - Abnormal; Notable for the following components:      Result Value   Glucose, Bld 107 (*)    Calcium 8.7 (*)    GFR, Estimated 59 (*)    All other components within normal limits  CBC - Abnormal; Notable for the following components:   RBC 2.45 (*)    Hemoglobin 8.1 (*)    HCT 25.5 (*)    MCV 104.1 (*)    RDW 25.8 (*)    nRBC 0.7 (*)    All other components within normal limits  CULTURE, BLOOD (ROUTINE X 2)  CULTURE, BLOOD (ROUTINE X 2)  SARS CORONAVIRUS 2 BY RT PCR  I-STAT CG4 LACTIC ACID,  ED  TROPONIN I (HIGH SENSITIVITY)  TROPONIN I (HIGH SENSITIVITY)    EKG EKG Interpretation Date/Time:  Monday July 10 2023 18:01:57 EDT Ventricular Rate:  73 PR Interval:  248 QRS Duration:  106 QT Interval:  412 QTC Calculation: 453 R Axis:   34  Text Interpretation: Sinus rhythm with sinus arrhythmia with 1st degree A-V block Incomplete right bundle branch block Borderline ECG When compared with ECG of 01-Jul-2023 09:36, PREVIOUS ECG IS PRESENT No significant change since last tracing Confirmed by Jacalyn Lefevre [phone]) on 07/10/2023 8:26:06 PM  Radiology CT Angio Chest PE W and/or Wo Contrast  Result Date: 07/10/2023 CLINICAL DATA:  Pulmonary embolism (PE) suspected, high prob EXAM: CT ANGIOGRAPHY CHEST WITH CONTRAST TECHNIQUE: Multidetector CT imaging of the chest was performed using the standard protocol during bolus administration of intravenous contrast. Multiplanar CT image reconstructions and MIPs were obtained to evaluate the vascular anatomy. RADIATION DOSE REDUCTION: This exam was performed according to the departmental dose-optimization program which includes automated exposure control, adjustment of the mA and/or kV according to patient size and/or use of iterative reconstruction technique. CONTRAST:  75mL OMNIPAQUE IOHEXOL 350 MG/ML SOLN COMPARISON:  CT angio chest 07/01/2023, CT angio chest 05/30/2022, CT heart 06/22/2023 FINDINGS: Cardiovascular: Left chest wall dual lead pacemaker in grossly appropriate position. Satisfactory opacification of the pulmonary arteries to the segmental level. No evidence of pulmonary embolism. Enlarged heart size. No significant pericardial effusion. The thoracic aorta is normal in caliber. Mild atherosclerotic plaque of the thoracic aorta. At least 2 vessel coronary artery calcifications. Mediastinum/Nodes: No enlarged mediastinal, hilar, or axillary lymph nodes. Thyroid gland, trachea, and esophagus demonstrate no significant findings.  Lungs/Pleura: Diffuse mild bronchial wall thickening. No focal consolidation. Stable subpleural and pleural right middle lobe pulmonary micronodules (6:81-82). Persistent lingular airspace opacity with underlying nodule not excluded (6:104). Interval decrease in size of a subpleural right lower lobe spiculated airspace opacity measuring 1.5 x 0.9 cm (from 2.8 x 1.8 cm). No pulmonary mass. No pleural effusion. No pneumothorax. Upper Abdomen: Punctate nonobstructive left renal calcification. Colonic diverticulosis. Musculoskeletal: No chest wall abnormality. No suspicious lytic or blastic osseous lesions. No acute displaced fracture. Multilevel degenerative changes of the spine. Review of the MIP images confirms the above findings. IMPRESSION: 1. No pulmonary embolus. 2. Likely improving multifocal pneumonia with persistent lingular airspace opacity with underlying nodule not excluded. Interval decrease in size of a subpleural right lower lobe  airspace opacity measuring 1.5 x 0.9 cm (from 2.8 x 1.8 cm). Recommend attention on follow-up. 3. Cardiomegaly. 4. Nonobstructive punctate left nephrolithiasis. Electronically Signed   By: Tish Frederickson M.D.   On: 07/10/2023 21:56   DG Chest 1 View  Result Date: 07/10/2023 CLINICAL DATA:  Chest pain.  Heart ablation 3 weeks ago. EXAM: CHEST  1 VIEW COMPARISON:  07/01/2023. FINDINGS: The heart size and mediastinal contours are stable. There is atherosclerotic calcification of the aorta. A dual lead pacemaker is present over the left chest. Hyperinflation of the lungs is noted. Mild atelectasis or infiltrate is noted at the lung bases. No effusion or pneumothorax. No acute osseous abnormality. IMPRESSION: Mild atelectasis at the lung bases. Electronically Signed   By: Thornell Sartorius M.D.   On: 07/10/2023 20:47    Procedures Procedures    Medications Ordered in ED Medications  cefTRIAXone (ROCEPHIN) 1 g in sodium chloride 0.9 % 100 mL IVPB (has no administration in  time range)  azithromycin (ZITHROMAX) 500 mg in sodium chloride 0.9 % 250 mL IVPB (has no administration in time range)  HYDROmorphone (DILAUDID) injection 1 mg (has no administration in time range)  morphine (PF) 4 MG/ML injection 4 mg (4 mg Intravenous Given 07/10/23 2024)  iohexol (OMNIPAQUE) 350 MG/ML injection 75 mL (75 mLs Intravenous Contrast Given 07/10/23 2101)    ED Course/ Medical Decision Making/ A&P                             Medical Decision Making Amount and/or Complexity of Data Reviewed Labs: ordered. Radiology: ordered.  Risk Prescription drug management. Decision regarding hospitalization.   This patient presents to the ED for concern of cp, this involves an extensive number of treatment options, and is a complaint that carries with it a high risk of complications and morbidity.  The differential diagnosis includes cad, pericarditis   Co morbidities that complicate the patient evaluation  afib (on eliquis), prostate cancer, tachy-brady syndrome s/p pacemaker, and CKD   Additional history obtained:  Additional history obtained from epic chart review External records from outside source obtained and reviewed including EMS report   Lab Tests:  I Ordered, and personally interpreted labs.  The pertinent results include:  cbc with hgb 8.1 (stable), bmp nl, trop nl   Imaging Studies ordered:  I ordered imaging studies including cta chest, cxr  I independently visualized and interpreted imaging which showed  CXR: Mild atelectasis at the lung bases.  CTA chest: No pulmonary embolus.  2. Likely improving multifocal pneumonia with persistent lingular  airspace opacity with underlying nodule not excluded. Interval  decrease in size of a subpleural right lower lobe airspace opacity  measuring 1.5 x 0.9 cm (from 2.8 x 1.8 cm). Recommend attention on  follow-up.  3. Cardiomegaly.  4. Nonobstructive punctate left nephrolithiasis.   I agree with the  radiologist interpretation   Cardiac Monitoring:  The patient was maintained on a cardiac monitor.  I personally viewed and interpreted the cardiac monitored which showed an underlying rhythm of: nsr   Medicines ordered and prescription drug management:  I ordered medication including morphine/dilaudid  for pain and rocephin/zithromax for pna  Reevaluation of the patient after these medicines showed that the patient improved I have reviewed the patients home medicines and have made adjustments as needed   Test Considered:  ct   Critical Interventions:  Pain control/abx   Consultations Obtained:  I requested consultation with  the hospitalist (Dr. Cyndia Bent),  and discussed lab and imaging findings as well as pertinent plan - she will admit   Problem List / ED Course:  Multifocal pneumonia:  it looks like this was there on his CT on 7/20, but he was never treated. Rocephin and zithromax started. Cp:  likely from the pneumonia.  Cardiac eval neg.  Pain is difficult to control.  Pt and family said he is unable to tolerate any oral pain meds.  They make him vomit.  Family worried about pain control at home.     Reevaluation:  After the interventions noted above, I reevaluated the patient and found that they have :improved   Social Determinants of Health:  Lives at home   Dispostion:  After consideration of the diagnostic results and the patients response to treatment, I feel that the patent would benefit from admission.          Final Clinical Impression(s) / ED Diagnoses Final diagnoses:  Anemia, unspecified type  Atypical chest pain  Multifocal pneumonia  Intractable pain    Rx / DC Orders ED Discharge Orders     None         Jacalyn Lefevre, MD 07/10/23 2247

## 2023-07-11 DIAGNOSIS — J189 Pneumonia, unspecified organism: Secondary | ICD-10-CM | POA: Diagnosis not present

## 2023-07-11 DIAGNOSIS — I309 Acute pericarditis, unspecified: Secondary | ICD-10-CM | POA: Diagnosis not present

## 2023-07-11 DIAGNOSIS — J188 Other pneumonia, unspecified organism: Secondary | ICD-10-CM

## 2023-07-11 LAB — HEPATIC FUNCTION PANEL
ALT: 13 U/L (ref 0–44)
AST: 15 U/L (ref 15–41)
Albumin: 3.6 g/dL (ref 3.5–5.0)
Alkaline Phosphatase: 63 U/L (ref 38–126)
Bilirubin, Direct: 0.2 mg/dL (ref 0.0–0.2)
Indirect Bilirubin: 0.9 mg/dL (ref 0.3–0.9)
Total Bilirubin: 1.1 mg/dL (ref 0.3–1.2)
Total Protein: 6.2 g/dL — ABNORMAL LOW (ref 6.5–8.1)

## 2023-07-11 LAB — BASIC METABOLIC PANEL
Anion gap: 8 (ref 5–15)
BUN: 13 mg/dL (ref 8–23)
CO2: 21 mmol/L — ABNORMAL LOW (ref 22–32)
Calcium: 8.2 mg/dL — ABNORMAL LOW (ref 8.9–10.3)
Chloride: 110 mmol/L (ref 98–111)
Creatinine, Ser: 1.18 mg/dL (ref 0.61–1.24)
GFR, Estimated: >60 mL/min (ref >60)
Glucose, Bld: 97 mg/dL (ref 70–99)
Potassium: 3.9 mmol/L (ref 3.5–5.1)
Sodium: 139 mmol/L (ref 135–145)

## 2023-07-11 LAB — CBC
HCT: 26.5 % — ABNORMAL LOW (ref 39.0–52.0)
Hemoglobin: 8.4 g/dL — ABNORMAL LOW (ref 13.0–17.0)
MCH: 33.6 pg (ref 26.0–34.0)
MCHC: 31.7 g/dL (ref 30.0–36.0)
MCV: 106 fL — ABNORMAL HIGH (ref 80.0–100.0)
Platelets: 266 10*3/uL (ref 150–400)
RBC: 2.5 MIL/uL — ABNORMAL LOW (ref 4.22–5.81)
RDW: 25.6 % — ABNORMAL HIGH (ref 11.5–15.5)
WBC: 5.1 10*3/uL (ref 4.0–10.5)
nRBC: 0.4 % — ABNORMAL HIGH (ref 0.0–0.2)

## 2023-07-11 LAB — LIPASE, BLOOD: Lipase: 32 U/L (ref 11–51)

## 2023-07-11 LAB — PROCALCITONIN: Procalcitonin: <0.1 ng/mL

## 2023-07-11 MED ORDER — HYDROMORPHONE HCL 1 MG/ML IJ SOLN
1.0000 mg | INTRAMUSCULAR | Status: DC | PRN
  Administered 2023-07-11 (×2): 1 mg via INTRAVENOUS
  Filled 2023-07-11 (×2): qty 1

## 2023-07-11 MED ORDER — COLCHICINE 0.6 MG PO TABS
0.6000 mg | ORAL_TABLET | Freq: Two times a day (BID) | ORAL | Status: DC
  Administered 2023-07-11 – 2023-07-13 (×4): 0.6 mg via ORAL
  Filled 2023-07-11 (×5): qty 1

## 2023-07-11 MED ORDER — GABAPENTIN 400 MG PO CAPS
400.0000 mg | ORAL_CAPSULE | Freq: Every day | ORAL | Status: DC
  Administered 2023-07-11 – 2023-07-12 (×2): 400 mg via ORAL
  Filled 2023-07-11: qty 4
  Filled 2023-07-11 (×2): qty 1

## 2023-07-11 MED ORDER — SODIUM CHLORIDE 0.9 % IV SOLN
1.0000 g | INTRAVENOUS | Status: DC
  Administered 2023-07-11: 1 g via INTRAVENOUS
  Filled 2023-07-11: qty 10

## 2023-07-11 MED ORDER — SODIUM CHLORIDE 0.9 % IV SOLN: INTRAVENOUS | Status: DC | PRN

## 2023-07-11 MED ORDER — ACETAMINOPHEN 325 MG PO TABS
650.0000 mg | ORAL_TABLET | Freq: Four times a day (QID) | ORAL | Status: DC | PRN
  Administered 2023-07-12: 650 mg via ORAL
  Filled 2023-07-11: qty 2

## 2023-07-11 MED ORDER — SODIUM CHLORIDE 0.9 % IV SOLN
500.0000 mg | INTRAVENOUS | Status: DC
  Administered 2023-07-11: 500 mg via INTRAVENOUS
  Filled 2023-07-11 (×2): qty 5

## 2023-07-11 MED ORDER — KETOROLAC TROMETHAMINE 30 MG/ML IJ SOLN
30.0000 mg | Freq: Three times a day (TID) | INTRAMUSCULAR | Status: AC
  Administered 2023-07-11 – 2023-07-12 (×3): 30 mg via INTRAVENOUS
  Filled 2023-07-11 (×3): qty 1

## 2023-07-11 MED ORDER — SODIUM CHLORIDE 0.9 % IV SOLN
12.5000 mg | Freq: Once | INTRAVENOUS | Status: AC
  Administered 2023-07-11: 12.5 mg via INTRAVENOUS
  Filled 2023-07-11: qty 0.5

## 2023-07-11 MED ORDER — APIXABAN 5 MG PO TABS
5.0000 mg | ORAL_TABLET | Freq: Two times a day (BID) | ORAL | Status: DC
  Administered 2023-07-11 – 2023-07-13 (×6): 5 mg via ORAL
  Filled 2023-07-11 (×6): qty 1

## 2023-07-11 MED ORDER — COLCHICINE 0.6 MG PO TABS
0.6000 mg | ORAL_TABLET | Freq: Every day | ORAL | Status: DC
  Administered 2023-07-11: 0.6 mg via ORAL
  Filled 2023-07-11: qty 1

## 2023-07-11 NOTE — Assessment & Plan Note (Addendum)
-  Currently in sinus rhythm s/p ablation x2 -continue Eliquis

## 2023-07-11 NOTE — Consult Note (Addendum)
Cardiology Consultation   Patient ID: Todd Salazar MRN: [id number]; DOB: 1936/11/01  Admit date: 07/10/2023 Date of Consult: 07/11/2023  PCP:  Patient, No Pcp Per   [facility] Providers Cardiologist:  Will [name], MD  Electrophysiologist:  Regan Lemming, MD       Patient Profile:   Todd Salazar is a 87 y.o. male with a hx of   CAD, paroxysmal atrial fibrillation on Eliquis, HTN Tachycardia-bradycardia syndrome s/p pacemaker, CKD3a, restrictive lung disease.    He had an atrial fibrillation ablation in 2022, followed by pericarditis which was successfully treated with colchicine.  Pt had re-do ablation for atrial fibrillation on 7/18 and subsequently readmitted on 7/20 for chest pain found to be post procedure pericarditis. Echo showed trivial pericardial effusion.Treated with NSAIDS and placed on colchicine for 3 months. CTA chest at the time showed focal consolidation in right lower lobe possible pneumonia. However antibiotics were deferred since leukocytosis though more related to pericarditis. He was also given IV lasix 20mg  x2 with improvement in bibasilar crackles. D/c 07/21.   He is being seen 07/11/2023 for the evaluation of chest pain and possible pericarditis at the request of Dr. Allena Katz.  History of Present Illness:   Todd Salazar presented early this a.m. with worsening persistent chest pain. Chest pain is mid-sternal and non-radiating. Worse with deep respirations. Better when he lays on his side and holds a pillow to his chest. No cough. Has some increasing shortness of breath, mainly because it hurts to breathe.   No fever. Initially denies nausea but had an episode of severe vomiting shortly after during my evaluation. He thinks maybe due to IV contrast or the opioids he has received. He took just two doses of Aleve for pain today. No alcohol use. He is s/p cholecystectomy. Had bowel movement earlier today.  Troponin reassuring at 10 down from 505 on  7/20.   [name] says the pain never went away.  When he left the hospital on 7/21, it was down to a 5/10.  It had been 10/10 when he was admitted.  Over the week or so since he left the hospital, the pain is gradually increased and last night, it got to a 10/10.  That is what made him come back to the hospital.  He is having the same symptoms as before with the pain eased up slightly by leaning forward and made worse by lying back although once he gets that he is okay.  It is also better if he lays on his side and holds a pillow to his chest.  It is worse with deep inspiration.  At first, during his last admission, he had some diarrhea after receiving colchicine twice daily.  He was discharged on colchicine 0.6 milligram daily and was compliant with this.  He did not have any additional diarrhea.  Since being in the hospital again, he has gotten Dilaudid and morphine as well as a dose of colchicine 0.6 mg and has been started on antibiotics for possible pneumonia.  The medications have decreased his chest pain to a 5/10.   Past Medical History:  Diagnosis Date   Arthritis    Atrial fibrillation (HCC)    Cancer (HCC)    prostate   Dysrhythmia    afib   History of kidney stones     Past Surgical History:  Procedure Laterality Date   ATRIAL FIBRILLATION ABLATION N/A 10/29/2021   Procedure: ATRIAL FIBRILLATION ABLATION;  Surgeon: Regan Lemming, MD;  Location: [facility] INVASIVE CV LAB;  Service: Cardiovascular;  Laterality: N/A;   ATRIAL FIBRILLATION ABLATION N/A 06/29/2023   Procedure: ATRIAL FIBRILLATION ABLATION;  Surgeon: Regan Lemming, MD;  Location: [facility] INVASIVE CV LAB;  Service: Cardiovascular;  Laterality: N/A;   Bone spur     CARDIOVERSION N/A 06/01/2022   Procedure: CARDIOVERSION;  Surgeon: Little Ishikawa, MD;  Location: [facility] ENDOSCOPY;  Service: Cardiovascular;  Laterality: N/A;   CHOLECYSTECTOMY     HERNIA REPAIR     JOINT REPLACEMENT     kidney stones      LAMINECTOMY WITH POSTERIOR LATERAL ARTHRODESIS LEVEL 1 Right 11/25/2019   Procedure: Laminectomy and Foraminotomy  Lumbar Four-Lumbar Five - right, with instrumented fusion;  Surgeon: Tia Alert, MD;  Location: [facility] OR;  Service: Neurosurgery;  Laterality: Right;  Laminectomy and Foraminotomy  Lumbar Four-Lumbar Five - right, with instrumented fusion   PROSTATE SURGERY     TEE WITHOUT CARDIOVERSION N/A 06/01/2022   Procedure: TRANSESOPHAGEAL ECHOCARDIOGRAM (TEE);  Surgeon: Little Ishikawa, MD;  Location: [facility] ENDOSCOPY;  Service: Cardiovascular;  Laterality: N/A;     Home Medications:  Prior to Admission medications   Medication Sig Start Date End Date Taking? Authorizing Provider  acetaminophen (TYLENOL) 500 MG tablet Take 1,000 mg by mouth as needed for moderate pain.   Yes [provider]  Calcium Carbonate Antacid (TUMS PO) Take 4 tablets by mouth as needed (discomfort).   Yes [provider]  colchicine 0.6 MG tablet Take 1 tablet (0.6 mg total) by mouth daily. 07/03/23  Yes Duke, Roe Rutherford, PA  ELIQUIS 5 MG TABS tablet TAKE 1 TABLET BY MOUTH 2 TIMES A DAY 04/11/23  Yes Camnitz, Will Daphine Deutscher, MD  gabapentin (NEURONTIN) 100 MG capsule Take 100 mg by mouth See admin instructions. Take with 300 mg for a total of 400 mg at bedtime 01/24/22  Yes [provider]  gabapentin (NEURONTIN) 300 MG capsule Take 300 mg by mouth See admin instructions. Take with 100 mg for a total of 400 mg at bedtime   Yes [provider]  nitroGLYCERIN (NITROSTAT) 0.4 MG SL tablet Place 0.4 mg under the tongue every 5 (five) minutes as needed for chest pain. 03/18/22  Yes [provider]    Inpatient Medications: Scheduled Meds:  apixaban  5 mg Oral BID   colchicine  0.6 mg Oral Daily   gabapentin  400 mg Oral QHS   Continuous Infusions:  azithromycin     cefTRIAXone (ROCEPHIN)  IV     PRN Meds: acetaminophen, HYDROmorphone (DILAUDID) injection  Allergies:     Allergies  Allergen Reactions   Ciprofloxacin Anaphylaxis and Other (See Comments)    Dizziness, also   Tramadol Swelling and Other (See Comments)    Laryngeal edema   Duloxetine Other (See Comments)    Delusions    Hydrocodone-Acetaminophen Nausea And Vomiting   Nisoldipine Other (See Comments)    Pt unsure of reaction    Benadryl [Diphenhydramine] Anxiety and Other (See Comments)    Panic attack    Methocarbamol Nausea Only   Ondansetron Hcl Nausea Only   Sulfa Antibiotics Rash    Social History:   Social History   Socioeconomic History   Marital status: Widowed    Spouse name: Not on file   Number of children: Not on file   Years of education: Not on file   Highest education level: Not on file  Occupational History   Not on file  Tobacco Use  Smoking status: Former    Current packs/day: 0.00    Types: Cigarettes    Quit date: 12/10/1996    Years since quitting: 26.6   Smokeless tobacco: Never   Tobacco comments:    Former smoker (08/27/2021)  Vaping Use   Vaping status: Never Used  Substance and Sexual Activity   Alcohol use: No   Drug use: No   Sexual activity: Never  Other Topics Concern   Not on file  Social History Narrative   Not on file   Social Determinants of Health   Financial Resource Strain: Not on file  Food Insecurity: Low Risk  (03/18/2023)   [facility]   Food vital sign    Within the past 12 months, you worried that your food would run out before you got money to buy more: Never true    Within the past 12 months, the food you bought just didn't last and you didn't have money to get more. : Never true  Transportation Needs: No Transportation Needs (03/18/2023)   [facility]   Transportation    In the past 12 months, has lack of reliable transportation kept you from medical appointments, meetings, work or from getting things needed for daily living? : No  Physical Activity: Not  on file  Stress: Not on file  Social Connections: Not on file  Intimate Partner Violence: Not on file    Family History:   Family History  Problem Relation Age of Onset   Hypertension Father    CAD Neg Hx    Heart failure Neg Hx    Stroke Neg Hx      ROS:  Please see the history of present illness.  All other ROS reviewed and negative.     Physical Exam/Data:   Vitals:   07/11/23 0945 07/11/23 1056 07/11/23 1315 07/11/23 1400  BP: (!) 105/59 (!) 98/57 (!) 105/56 (!) 103/53  Pulse: 69 70 70 71  Resp: 10 12 14 15   Temp:      TempSrc:      SpO2: 96% 91% 94% 94%  Weight:      Height:        Intake/Output Summary (Last 24 hours) at 07/11/2023 1413 Last data filed at 07/11/2023 0833 Gross per 24 hour  Intake --  Output 300 ml  Net -300 ml      07/10/2023    6:05 PM 07/02/2023    4:47 AM 07/01/2023    1:37 AM  Last 3 Weights  Weight (lbs) 198 lb 6.6 oz 198 lb 12.8 oz 191 lb 12.8 oz  Weight (kg) 90 kg 90.175 kg 87 kg     Body mass index is 24.8 kg/m.  General:  Well nourished, well developed, in no acute distress at rest HEENT: normal Neck: JVD 10 cm Vascular: No carotid bruits; Distal pulses 2+ bilaterally Cardiac:  normal S1, S2; RRR; no murmur, +rub Lungs:  rales L>R, no wheezing, rhonchi   Abd: soft, nontender, no hepatomegaly  Ext: no edema Musculoskeletal:  No deformities, BUE and BLE strength normal and equal Skin: warm and dry  Neuro:  CNs 2-12 intact, no focal abnormalities noted Psych:  Normal affect   EKG:  The EKG was personally reviewed and demonstrates:  SR, HR 73,  inc RBBB w/ QRS duration 106 mm, no PR depression or ST elevation noted Telemetry:  Telemetry was personally reviewed and demonstrates: Sinus rhythm  Relevant CV Studies:  ECHO: 07/01/23  1. Left ventricular ejection fraction, by estimation, is 60 to 65%. The left ventricle has normal function. The left ventricle has no regional wall motion abnormalities. Left ventricular diastolic  parameters were normal.   2. Right ventricular systolic function is normal. The right ventricular size is normal. There is severely elevated pulmonary artery systolic pressure.   3. Left atrial size was mildly dilated.   4. Right atrial size was mildly dilated.   5. There is no evidence of cardiac tamponade.   6. The mitral valve is normal in structure. No evidence of mitral valve regurgitation. No evidence of mitral stenosis.   7. Tricuspid valve regurgitation is mild to moderate.   8. The aortic valve is tricuspid. There is mild calcification of the aortic valve. Aortic valve regurgitation is not visualized. Aortic valve sclerosis is present, with no evidence of aortic valve stenosis.   9. There is borderline dilatation of the ascending aorta, measuring 39 mm.  10. The inferior vena cava is dilated in size with <50% respiratory variability, suggesting right atrial pressure of 15 mmHg.   Laboratory Data:  High Sensitivity Troponin:   Recent Labs  Lab 07/01/23 0130 07/01/23 0345 07/10/23 1813 07/10/23 2026  TROPONINIHS 556* 505* 10 10     Chemistry Recent Labs  Lab 07/10/23 1813 07/11/23 0429  NA 144 139  K 4.2 3.9  CL 109 110  CO2 24 21*  GLUCOSE 107* 97  BUN 15 13  CREATININE 1.20 1.18  CALCIUM 8.7* 8.2*  GFRNONAA 59* >60  ANIONGAP 11 8    Recent Labs  Lab 07/11/23 0039  PROT 6.2*  ALBUMIN 3.6  AST 15  ALT 13  ALKPHOS 63  BILITOT 1.1   Hematology Recent Labs  Lab 07/06/23 0857 07/10/23 1813 07/11/23 0429  WBC 5.0 4.2 5.1  RBC 2.56* 2.45* 2.50*  HGB 8.5* 8.1* 8.4*  HCT 25.2* 25.5* 26.5*  MCV 98* 104.1* 106.0*  MCH 33.2* 33.1 33.6  MCHC 33.7 31.8 31.7  RDW 23.2* 25.8* 25.6*  PLT 327 291 266   Lab Results  Component Value Date   ESRSEDRATE 3 07/01/2023   C-Reactive Protein     Component Value Date/Time   CRP 2.8 (H) 07/01/2023 0801     Radiology/Studies:  CT Angio Chest PE W and/or Wo Contrast  Result Date: 07/10/2023 CLINICAL DATA:   Pulmonary embolism (PE) suspected, high prob EXAM: CT ANGIOGRAPHY CHEST WITH CONTRAST TECHNIQUE: Multidetector CT imaging of the chest was performed using the standard protocol during bolus administration of intravenous contrast. Multiplanar CT image reconstructions and MIPs were obtained to evaluate the vascular anatomy. RADIATION DOSE REDUCTION: This exam was performed according to the departmental dose-optimization program which includes automated exposure control, adjustment of the mA and/or kV according to patient size and/or use of iterative reconstruction technique. CONTRAST:  75mL OMNIPAQUE IOHEXOL 350 MG/ML SOLN COMPARISON:  CT angio chest 07/01/2023, CT angio chest 05/30/2022, CT heart 06/22/2023 FINDINGS: Cardiovascular: Left chest wall dual lead pacemaker in grossly appropriate position. Satisfactory opacification of the pulmonary arteries to the segmental level. No evidence of pulmonary embolism. Enlarged heart size. No significant pericardial effusion. The thoracic aorta is normal in caliber. Mild atherosclerotic plaque of the thoracic aorta. At least 2 vessel coronary artery calcifications. Mediastinum/Nodes: No enlarged mediastinal, hilar, or axillary lymph nodes. Thyroid gland, trachea, and esophagus demonstrate no significant findings. Lungs/Pleura: Diffuse mild bronchial wall thickening. No focal consolidation. Stable subpleural and pleural right middle lobe pulmonary micronodules (6:81-82). Persistent lingular airspace opacity with underlying  nodule not excluded (6:104). Interval decrease in size of a subpleural right lower lobe spiculated airspace opacity measuring 1.5 x 0.9 cm (from 2.8 x 1.8 cm). No pulmonary mass. No pleural effusion. No pneumothorax. Upper Abdomen: Punctate nonobstructive left renal calcification. Colonic diverticulosis. Musculoskeletal: No chest wall abnormality. No suspicious lytic or blastic osseous lesions. No acute displaced fracture. Multilevel degenerative changes of  the spine. Review of the MIP images confirms the above findings. IMPRESSION: 1. No pulmonary embolus. 2. Likely improving multifocal pneumonia with persistent lingular airspace opacity with underlying nodule not excluded. Interval decrease in size of a subpleural right lower lobe airspace opacity measuring 1.5 x 0.9 cm (from 2.8 x 1.8 cm). Recommend attention on follow-up. 3. Cardiomegaly. 4. Nonobstructive punctate left nephrolithiasis. Electronically Signed   By: Tish Frederickson M.D.   On: 07/10/2023 21:56   DG Chest 1 View  Result Date: 07/10/2023 CLINICAL DATA:  Chest pain.  Heart ablation 3 weeks ago. EXAM: CHEST  1 VIEW COMPARISON:  07/01/2023. FINDINGS: The heart size and mediastinal contours are stable. There is atherosclerotic calcification of the aorta. A dual lead pacemaker is present over the left chest. Hyperinflation of the lungs is noted. Mild atelectasis or infiltrate is noted at the lung bases. No effusion or pneumothorax. No acute osseous abnormality. IMPRESSION: Mild atelectasis at the lung bases. Electronically Signed   By: Thornell Sartorius M.D.   On: 07/10/2023 20:47     Assessment and Plan:   Recurrent pericarditis - colchicine 0.6 mg every day was not effective. -He reported diarrhea after being on colchicine twice daily. -NSAIDs were not added to the colchicine because of him being on Eliquis -Will try IV Toradol for 24 hours, Pharmacy contacted to see if there is an NSAID he can relatively safely take - their recommendation is to use steroids instead of an oral NSAID, Toradol overnight is ok. -Continue colchicine daily -Recheck ESR and CRP, do limited echo to make sure no restrictive physiology as his blood pressure is a little on the low side -If his pain is improved tomorrow and the echo is okay, okay for discharge -Keep follow-up appointment with the [facility] on 8/15   Risk Assessment/Risk Scores:          CHA2DS2-VASc Score = 2   This indicates a 2.2% annual  risk of stroke. The patient's score is based upon: CHF History: 0 HTN History: 0 Diabetes History: 0 Stroke History: 0 Vascular Disease History: 0 Age Score: 2 Gender Score: 0   For questions or updates, please contact [facility] Please consult [email or web address] for contact info under    Signed, Theodore Demark, PA-C  07/11/2023 2:13 PM  Patient seen and examined with Theodore Demark PA-C.  Agree as above, with the following exceptions and changes as noted below. Pt with better controlled chest pain at this time after receiving narcotic pain meds and toradol. Gen: NAD, CV: RRR, no murmurs, Lungs: clear, Abd: soft, Extrem: Warm, well perfused, no edema, Neuro/Psych: alert and oriented x 3, normal mood and affect. All available labs, radiology testing, previous records reviewed. Recheck esr and crp, limited echo in am and increase colchicine to 0.6 mg BID and see if diarrhea recurs.   Parke Poisson, MD 07/11/23 6:58 PM

## 2023-07-11 NOTE — Assessment & Plan Note (Signed)
-  Had new acute episode of vomiting in ED. Possibly from IV opioids -will check LFTs and lipase. He has hx of cholecystectomy. No alcohol use. Only took occasional NSAIDS this week.  -PRN antiemetics

## 2023-07-11 NOTE — Progress Notes (Signed)
New Admission Note: 88M-11   Arrival Method: ED stretcher Mental Orientation: a/ox4 Telemetry: box 11 Assessment: completed Skin: intact IV: left forearm, right forearm Pain:n/a Tubes: n/a Safety Measures: non-skid socks, bed alarm Admission:  Orientation: Patient has been oriented to the room, unit and staff.  Family: n/a Belongings: kept at bedside  Orders have been reviewed and implemented. Will continue to monitor the patient. Call light has been placed within reach and bed alarm has been activated.   Fabian Sharp BSN, RN-BC Phone number: [phone]

## 2023-07-11 NOTE — ED Notes (Signed)
ED TO INPATIENT HANDOFF REPORT  ED Nurse Name and Phone #: Cat [phone]  S Name/Age/Gender Todd Salazar 87 y.o. male Room/Bed: 043C/043C  Code Status   Code Status: Full Code  Home/SNF/Other Home Patient oriented to: self, place, time, and situation Is this baseline? Yes   Triage Complete: Triage complete  Chief Complaint Multifocal pneumonia [J18.9]  Triage Note Pt arrives via EMS from home with reports of chest pain with any movement since having a ablation 3 weeks ago.    Allergies Allergies  Allergen Reactions   Ciprofloxacin Anaphylaxis and Other (See Comments)    Dizziness, also   Tramadol Swelling and Other (See Comments)    Laryngeal edema   Duloxetine Other (See Comments)    Delusions    Hydrocodone-Acetaminophen Nausea And Vomiting   Nisoldipine Other (See Comments)    Pt unsure of reaction    Benadryl [Diphenhydramine] Anxiety and Other (See Comments)    Panic attack    Methocarbamol Nausea Only   Ondansetron Hcl Nausea Only   Sulfa Antibiotics Rash    Level of Care/Admitting Diagnosis ED Disposition     ED Disposition  Admit   Condition  --   Comment  Hospital Area: [facility] [[id number]]  Level of Care: Telemetry Medical [104]  May place patient in observation at [facility] or [facility] if equivalent level of care is available:: No  Covid Evaluation: Asymptomatic - no recent exposure (last 10 days) testing not required  Diagnosis: Multifocal pneumonia [[id number]]  Admitting Physician: Anselm Jungling [[id number]]  Attending Physician: Anselm Jungling [[id number]]          B Medical/Surgery History Past Medical History:  Diagnosis Date   Arthritis    Atrial fibrillation (HCC)    Cancer (HCC)    prostate   Dysrhythmia    afib   History of kidney stones    Past Surgical History:  Procedure Laterality Date   ATRIAL FIBRILLATION ABLATION N/A 10/29/2021   Procedure: ATRIAL FIBRILLATION ABLATION;  Surgeon: Regan Lemming,  MD;  Location: [facility] INVASIVE CV LAB;  Service: Cardiovascular;  Laterality: N/A;   ATRIAL FIBRILLATION ABLATION N/A 06/29/2023   Procedure: ATRIAL FIBRILLATION ABLATION;  Surgeon: Regan Lemming, MD;  Location: [facility] INVASIVE CV LAB;  Service: Cardiovascular;  Laterality: N/A;   Bone spur     CARDIOVERSION N/A 06/01/2022   Procedure: CARDIOVERSION;  Surgeon: Little Ishikawa, MD;  Location: [facility] ENDOSCOPY;  Service: Cardiovascular;  Laterality: N/A;   CHOLECYSTECTOMY     HERNIA REPAIR     JOINT REPLACEMENT     kidney stones     LAMINECTOMY WITH POSTERIOR LATERAL ARTHRODESIS LEVEL 1 Right 11/25/2019   Procedure: Laminectomy and Foraminotomy  Lumbar Four-Lumbar Five - right, with instrumented fusion;  Surgeon: Tia Alert, MD;  Location: [facility] OR;  Service: Neurosurgery;  Laterality: Right;  Laminectomy and Foraminotomy  Lumbar Four-Lumbar Five - right, with instrumented fusion   PROSTATE SURGERY     TEE WITHOUT CARDIOVERSION N/A 06/01/2022   Procedure: TRANSESOPHAGEAL ECHOCARDIOGRAM (TEE);  Surgeon: Little Ishikawa, MD;  Location: [facility] ENDOSCOPY;  Service: Cardiovascular;  Laterality: N/A;     A IV Location/Drains/Wounds Patient Lines/Drains/Airways Status     Active Line/Drains/Airways     Name Placement date Placement time Site Days   Peripheral IV 07/10/23 20 G Anterior;Left;Proximal Forearm 07/10/23  1814  Forearm  1   Peripheral IV 07/11/23 20 G Distal;Right;Posterior Forearm 07/11/23  0051  Forearm  less than 1  Intake/Output Last 24 hours  Intake/Output Summary (Last 24 hours) at 07/11/2023 1550 Last data filed at 07/11/2023 4008 Gross per 24 hour  Intake --  Output 300 ml  Net -300 ml    Labs/Imaging Results for orders placed or performed during the hospital encounter of 07/10/23 (from the past 48 hour(s))  Basic metabolic panel     Status: Abnormal   Collection Time: 07/10/23  6:13 PM  Result Value Ref Range   Sodium 144 135 - 145 mmol/L    Potassium 4.2 3.5 - 5.1 mmol/L   Chloride 109 98 - 111 mmol/L   CO2 24 22 - 32 mmol/L   Glucose, Bld 107 (H) 70 - 99 mg/dL    Comment: Glucose reference range applies only to samples taken after fasting for at least 8 hours.   BUN 15 8 - 23 mg/dL   Creatinine, Ser 6.76 0.61 - 1.24 mg/dL   Calcium 8.7 (L) 8.9 - 10.3 mg/dL   GFR, Estimated 59 (L) >60 mL/min    Comment: (NOTE) Calculated using the CKD-EPI Creatinine Equation (2021)    Anion gap 11 5 - 15    Comment: Performed at [facility] Lab, [address], [place], [place] [address]  CBC     Status: Abnormal   Collection Time: 07/10/23  6:13 PM  Result Value Ref Range   WBC 4.2 4.0 - 10.5 K/uL   RBC 2.45 (L) 4.22 - 5.81 MIL/uL   Hemoglobin 8.1 (L) 13.0 - 17.0 g/dL   HCT 32.6 (L) 71.2 - 45.8 %   MCV 104.1 (H) 80.0 - 100.0 fL   MCH 33.1 26.0 - 34.0 pg   MCHC 31.8 30.0 - 36.0 g/dL   RDW 09.9 (H) 83.3 - 82.5 %   Platelets 291 150 - 400 K/uL   nRBC 0.7 (H) 0.0 - 0.2 %    Comment: Performed at [facility] Lab, [address], [place], [place] [address]  Troponin I (High Sensitivity)     Status: None   Collection Time: 07/10/23  6:13 PM  Result Value Ref Range   Troponin I (High Sensitivity) 10 <18 ng/L    Comment: (NOTE) Elevated high sensitivity troponin I (hsTnI) values and significant  changes across serial measurements may suggest ACS but many other  chronic and acute conditions are known to elevate hsTnI results.  Refer to the "Links" section for chest pain algorithms and additional  guidance. Performed at [facility], [address], [place], [place] [address]   Troponin I (High Sensitivity)     Status: None   Collection Time: 07/10/23  8:26 PM  Result Value Ref Range   Troponin I (High Sensitivity) 10 <18 ng/L    Comment: (NOTE) Elevated high sensitivity troponin I (hsTnI) values and significant  changes across serial measurements may suggest ACS but many other  chronic and acute conditions are known to  elevate hsTnI results.  Refer to the "Links" section for chest pain algorithms and additional  guidance. Performed at [facility], [address], [place], [place] [address]   Culture, blood (routine x 2)     Status: None (Preliminary result)   Collection Time: 07/10/23 11:15 PM   Specimen: BLOOD LEFT HAND  Result Value Ref Range   Specimen Description BLOOD LEFT HAND    Special Requests      BOTTLES DRAWN AEROBIC AND ANAEROBIC Blood Culture results may not be optimal due to an excessive volume of blood received  in culture bottles   Culture      NO GROWTH < 12 HOURS Performed at [facility], [address], [place], [place] [address]    Report Status PENDING   I-Stat CG4 Lactic Acid     Status: None   Collection Time: 07/10/23 11:22 PM  Result Value Ref Range   Lactic Acid, Venous 0.8 0.5 - 1.9 mmol/L  Culture, blood (routine x 2)     Status: None (Preliminary result)   Collection Time: 07/10/23 11:51 PM   Specimen: BLOOD  Result Value Ref Range   Specimen Description BLOOD BLOOD LEFT HAND    Special Requests      BOTTLES DRAWN AEROBIC AND ANAEROBIC Blood Culture adequate volume   Culture      NO GROWTH < 12 HOURS Performed at [facility], [address], [place], [place] [address]    Report Status PENDING   SARS Coronavirus 2 by RT PCR (hospital order, performed in [facility] hospital lab) *cepheid single result test* Anterior Nasal Swab     Status: None   Collection Time: 07/10/23 11:51 PM   Specimen: Anterior Nasal Swab  Result Value Ref Range   SARS Coronavirus 2 by RT PCR NEGATIVE NEGATIVE    Comment: Performed at [facility] Lab, [address], [place], [place] [address]  Lipase, blood     Status: None   Collection Time: 07/11/23 12:39 AM  Result Value Ref Range   Lipase 32 11 - 51 U/L    Comment: Performed at [facility], [address], [place], [place] [address]  Hepatic function panel     Status: Abnormal   Collection Time:  07/11/23 12:39 AM  Result Value Ref Range   Total Protein 6.2 (L) 6.5 - 8.1 g/dL   Albumin 3.6 3.5 - 5.0 g/dL   AST 15 15 - 41 U/L   ALT 13 0 - 44 U/L   Alkaline Phosphatase 63 38 - 126 U/L   Total Bilirubin 1.1 0.3 - 1.2 mg/dL   Bilirubin, Direct 0.2 0.0 - 0.2 mg/dL   Indirect Bilirubin 0.9 0.3 - 0.9 mg/dL    Comment: Performed at [facility] Lab, [address] [place], [place] [address]  I-Stat CG4 Lactic Acid     Status: None   Collection Time: 07/11/23 12:48 AM  Result Value Ref Range   Lactic Acid, Venous 1.0 0.5 - 1.9 mmol/L  CBC     Status: Abnormal   Collection Time: 07/11/23  4:29 AM  Result Value Ref Range   WBC 5.1 4.0 - 10.5 K/uL   RBC 2.50 (L) 4.22 - 5.81 MIL/uL   Hemoglobin 8.4 (L) 13.0 - 17.0 g/dL   HCT 69.6 (L) 29.5 - 28.4 %   MCV 106.0 (H) 80.0 - 100.0 fL   MCH 33.6 26.0 - 34.0 pg   MCHC 31.7 30.0 - 36.0 g/dL   RDW 13.2 (H) 44.0 - 10.2 %   Platelets 266 150 - 400 K/uL   nRBC 0.4 (H) 0.0 - 0.2 %    Comment: Performed at [facility] Lab, [address], [place], [place] [address]  Basic metabolic panel     Status: Abnormal   Collection Time: 07/11/23  4:29 AM  Result Value Ref Range   Sodium 139 135 - 145 mmol/L   Potassium 3.9 3.5 - 5.1 mmol/L   Chloride 110 98 - 111 mmol/L   CO2 21 (L) 22 - 32  mmol/L   Glucose, Bld 97 70 - 99 mg/dL    Comment: Glucose reference range applies only to samples taken after fasting for at least 8 hours.   BUN 13 8 - 23 mg/dL   Creatinine, Ser 1.61 0.61 - 1.24 mg/dL   Calcium 8.2 (L) 8.9 - 10.3 mg/dL   GFR, Estimated >09 >60 mL/min    Comment: (NOTE) Calculated using the CKD-EPI Creatinine Equation (2021)    Anion gap 8 5 - 15    Comment: Performed at [facility], [address], [place], [place] [address]  Procalcitonin     Status: None   Collection Time: 07/11/23  8:30 AM  Result Value Ref Range   Procalcitonin <0.10 ng/mL    Comment:        Interpretation: PCT (Procalcitonin) <= 0.5 ng/mL: Systemic  infection (sepsis) is not likely. Local bacterial infection is possible. (NOTE)       Sepsis PCT Algorithm           Lower Respiratory Tract                                      Infection PCT Algorithm    ----------------------------     ----------------------------         PCT < 0.25 ng/mL                PCT < 0.10 ng/mL          Strongly encourage             Strongly discourage   discontinuation of antibiotics    initiation of antibiotics    ----------------------------     -----------------------------       PCT 0.25 - 0.50 ng/mL            PCT 0.10 - 0.25 ng/mL               OR       >80% decrease in PCT            Discourage initiation of                                            antibiotics      Encourage discontinuation           of antibiotics    ----------------------------     -----------------------------         PCT >= 0.50 ng/mL              PCT 0.26 - 0.50 ng/mL               AND        <80% decrease in PCT             Encourage initiation of                                             antibiotics       Encourage continuation           of antibiotics    ----------------------------     -----------------------------        PCT >= 0.50 ng/mL  PCT > 0.50 ng/mL               AND         increase in PCT                  Strongly encourage                                      initiation of antibiotics    Strongly encourage escalation           of antibiotics                                     -----------------------------                                           PCT <= 0.25 ng/mL                                                 OR                                        > 80% decrease in PCT                                      Discontinue / Do not initiate                                             antibiotics  Performed at [facility] Lab, [address], [place], [place] [address]    CT Angio Chest PE W and/or Wo Contrast  Result Date:  07/10/2023 CLINICAL DATA:  Pulmonary embolism (PE) suspected, high prob EXAM: CT ANGIOGRAPHY CHEST WITH CONTRAST TECHNIQUE: Multidetector CT imaging of the chest was performed using the standard protocol during bolus administration of intravenous contrast. Multiplanar CT image reconstructions and MIPs were obtained to evaluate the vascular anatomy. RADIATION DOSE REDUCTION: This exam was performed according to the departmental dose-optimization program which includes automated exposure control, adjustment of the mA and/or kV according to patient size and/or use of iterative reconstruction technique. CONTRAST:  75mL OMNIPAQUE IOHEXOL 350 MG/ML SOLN COMPARISON:  CT angio chest 07/01/2023, CT angio chest 05/30/2022, CT heart 06/22/2023 FINDINGS: Cardiovascular: Left chest wall dual lead pacemaker in grossly appropriate position. Satisfactory opacification of the pulmonary arteries to the segmental level. No evidence of pulmonary embolism. Enlarged heart size. No significant pericardial effusion. The thoracic aorta is normal in caliber. Mild atherosclerotic plaque of the thoracic aorta. At least 2 vessel coronary artery calcifications. Mediastinum/Nodes: No enlarged mediastinal, hilar, or axillary lymph nodes. Thyroid gland, trachea, and esophagus demonstrate no significant findings. Lungs/Pleura: Diffuse mild bronchial wall thickening. No focal consolidation. Stable subpleural and pleural right middle lobe pulmonary micronodules (6:81-82). Persistent lingular airspace opacity with underlying  nodule not excluded (6:104). Interval decrease in size of a subpleural right lower lobe spiculated airspace opacity measuring 1.5 x 0.9 cm (from 2.8 x 1.8 cm). No pulmonary mass. No pleural effusion. No pneumothorax. Upper Abdomen: Punctate nonobstructive left renal calcification. Colonic diverticulosis. Musculoskeletal: No chest wall abnormality. No suspicious lytic or blastic osseous lesions. No acute displaced fracture.  Multilevel degenerative changes of the spine. Review of the MIP images confirms the above findings. IMPRESSION: 1. No pulmonary embolus. 2. Likely improving multifocal pneumonia with persistent lingular airspace opacity with underlying nodule not excluded. Interval decrease in size of a subpleural right lower lobe airspace opacity measuring 1.5 x 0.9 cm (from 2.8 x 1.8 cm). Recommend attention on follow-up. 3. Cardiomegaly. 4. Nonobstructive punctate left nephrolithiasis. Electronically Signed   By: Tish Frederickson M.D.   On: 07/10/2023 21:56   DG Chest 1 View  Result Date: 07/10/2023 CLINICAL DATA:  Chest pain.  Heart ablation 3 weeks ago. EXAM: CHEST  1 VIEW COMPARISON:  07/01/2023. FINDINGS: The heart size and mediastinal contours are stable. There is atherosclerotic calcification of the aorta. A dual lead pacemaker is present over the left chest. Hyperinflation of the lungs is noted. Mild atelectasis or infiltrate is noted at the lung bases. No effusion or pneumothorax. No acute osseous abnormality. IMPRESSION: Mild atelectasis at the lung bases. Electronically Signed   By: Thornell Sartorius M.D.   On: 07/10/2023 20:47    Pending Labs Unresulted Labs (From admission, onward)    None       Vitals/Pain Today's Vitals   07/11/23 0945 07/11/23 1056 07/11/23 1315 07/11/23 1400  BP: (!) 105/59 (!) 98/57 (!) 105/56 (!) 103/53  Pulse: 69 70 70 71  Resp: 10 12 14 15   Temp:   97.8 F (36.6 C)   TempSrc:      SpO2: 96% 91% 94% 94%  Weight:      Height:      PainSc:        Isolation Precautions No active isolations  Medications Medications  colchicine tablet 0.6 mg (0.6 mg Oral Given 07/11/23 0909)  apixaban (ELIQUIS) tablet 5 mg (5 mg Oral Given 07/11/23 0909)  gabapentin (NEURONTIN) capsule 400 mg (400 mg Oral Patient Refused/Not Given 07/11/23 0055)  acetaminophen (TYLENOL) tablet 650 mg (has no administration in time range)  cefTRIAXone (ROCEPHIN) 1 g in sodium chloride 0.9 % 100 mL IVPB  (has no administration in time range)  azithromycin (ZITHROMAX) 500 mg in sodium chloride 0.9 % 250 mL IVPB (has no administration in time range)  HYDROmorphone (DILAUDID) injection 1 mg (1 mg Intravenous Given 07/11/23 0827)  ketorolac (TORADOL) 30 MG/ML injection 30 mg (30 mg Intravenous Given 07/11/23 1431)  morphine (PF) 4 MG/ML injection 4 mg (4 mg Intravenous Given 07/10/23 2024)  iohexol (OMNIPAQUE) 350 MG/ML injection 75 mL (75 mLs Intravenous Contrast Given 07/10/23 2101)  cefTRIAXone (ROCEPHIN) 1 g in sodium chloride 0.9 % 100 mL IVPB (0 g Intravenous Stopped 07/11/23 0029)  azithromycin (ZITHROMAX) 500 mg in sodium chloride 0.9 % 250 mL IVPB (0 mg Intravenous Stopped 07/11/23 0205)  HYDROmorphone (DILAUDID) injection 1 mg (1 mg Intravenous Given 07/10/23 2300)  promethazine (PHENERGAN) 12.5 mg in sodium chloride 0.9 % 50 mL IVPB (0 mg Intravenous Stopped 07/11/23 0205)    Mobility walks     Focused Assessments Pulmonary Assessment Handoff:  Lung sounds:   O2 Device: Room Air      R Recommendations: See Admitting Provider Note  Report given to:  Additional Notes:

## 2023-07-11 NOTE — Assessment & Plan Note (Addendum)
-  seen on CT chest on 7/20 and repeat CTA chest today shows resolving multifocal pneumonia -continue IV Rocephin and Azithromycin

## 2023-07-11 NOTE — Assessment & Plan Note (Signed)
s/p PPM

## 2023-07-11 NOTE — Assessment & Plan Note (Signed)
on Eliquis 

## 2023-07-11 NOTE — Hospital Course (Signed)
Brief hospital course: PMH of CAD, PAF on Eliquis, HTN, tachybradycardia syndrome, CKD 3A presented to hospital with complaints of chest pain. Patient had a redo ablation for A-fib on 7/18 outpatient. Present to ER in 7/24 complaining of chest pain.  Was admitted to the hospital for concerns for pericarditis.  Discharged on 7/21 on colchicine therapy. Presents again on 7/29 with complaints of worsening chest pain specifically on last 2 days.  Associated with nausea.  Denies having any fever chills or cough or shortness of breath.  Assessment and Plan: Post ablation pericarditis. Echocardiogram recently showed trivial effusion. Was started on colchicine. Patient continues to have on and off chest pain.  While the troponins are reassuring. Will discuss with cardiology for further assistance as unsure if colchicine is being effective in controlling his pericarditis.  Multifocal pneumonia. CT scan on 7/11 shows dependent atelectasis CT scan on 7/20 shows left lower lobe consolidation. CT of the chest on 7/29 actually shows improving left lower lobe opacity. Procalcitonin level is negative. PE is negative. At present my suspicion for a multifocal pneumonia causing his chest pain is very low. COVID test is negative. Currently on antibiotic which I will continue.  CAD. Management per cardiology. Troponins are unremarkable.  Vomiting after contrast. Suspect this is reaction to contrast. At present no further vomiting or nausea. Will monitor.  Did not take any narcotic pain medications at home.  Paroxysmal A-fib. Not on any rate control medication. On Eliquis.  Which I will continue.

## 2023-07-11 NOTE — Progress Notes (Signed)
[facility] Progress Note Patient: Todd Salazar ZOX:[id number] DOB: 05-22-1936 DOA: 07/10/2023  DOS: the patient was seen and examined on 07/11/2023  Brief hospital course: PMH of CAD, PAF on Eliquis, HTN, tachybradycardia syndrome, CKD 3A presented to hospital with complaints of chest pain. Patient had a redo ablation for A-fib on 7/18 outpatient. Present to ER in 7/24 complaining of chest pain.  Was admitted to the hospital for concerns for pericarditis.  Discharged on 7/21 on colchicine therapy. Presents again on 7/29 with complaints of worsening chest pain specifically on last 2 days.  Associated with nausea.  Denies having any fever chills or cough or shortness of breath.  Assessment and Plan: Post ablation pericarditis. Echocardiogram recently showed trivial effusion. Was started on colchicine. Patient continues to have on and off chest pain.  While the troponins are reassuring. Will discuss with cardiology for further assistance as unsure if colchicine is being effective in controlling his pericarditis.  Multifocal pneumonia. CT scan on 7/11 shows dependent atelectasis CT scan on 7/20 shows left lower lobe consolidation. CT of the chest on 7/29 actually shows improving left lower lobe opacity. Procalcitonin level is negative. PE is negative. At present my suspicion for a multifocal pneumonia causing his chest pain is very low. COVID test is negative. Currently on antibiotic which I will continue.  CAD. Management per cardiology. Troponins are unremarkable.  Vomiting after contrast. Suspect this is reaction to contrast. At present no further vomiting or nausea. Will monitor.  Did not take any narcotic pain medications at home.  Paroxysmal A-fib. Not on any rate control medication. On Eliquis.  Which I will continue.   Subjective: Continues to have some chest pain but improving.  No nausea no vomiting.  No abdominal pain.  Passing gas.  Had a BM.  No fever no chills  reported.  No cough.  Physical Exam: General: in Mild distress, No Rash Cardiovascular: S1 and S2 Present, No Murmur Respiratory: Good respiratory effort, Bilateral Air entry present.  Faint basal crackles, No wheezes Abdomen: Bowel Sound present, No tenderness Extremities: No edema Neuro: Alert and oriented x3, no new focal deficit  Data Reviewed: I have Reviewed nursing notes, Vitals, and Lab results. Since last encounter, pertinent lab results CBC and BMP   . I have ordered test including procalcitonin  . I have discussed pt's care plan and test results with cardiology  .   Disposition: Status is: Observation  apixaban (ELIQUIS) tablet 5 mg   Family Communication: No one at bedside Level of care: Telemetry Medical   Vitals:   07/11/23 1315 07/11/23 1400 07/11/23 1530 07/11/23 1615  BP: (!) 105/56 (!) 103/53 100/60 (!) 104/50  Pulse: 70 71 69 64  Resp: 14 15 13 15   Temp: 97.8 F (36.6 C)     TempSrc:      SpO2: 94% 94% 96% 95%  Weight:      Height:         Author: Lynden Oxford, MD 07/11/2023 5:34 PM  Please look on [email or web address] to find out who is on call.

## 2023-07-11 NOTE — Assessment & Plan Note (Signed)
-  post procedure pericarditis following ablation for a.fib on 7/18 -echo on 7/20 with trivial pericardial effusion -troponin and EKG today is reassuring  -continue colchicine

## 2023-07-12 ENCOUNTER — Observation Stay (HOSPITAL_COMMUNITY): Payer: PPO

## 2023-07-12 DIAGNOSIS — I308 Other forms of acute pericarditis: Secondary | ICD-10-CM | POA: Diagnosis not present

## 2023-07-12 DIAGNOSIS — I480 Paroxysmal atrial fibrillation: Secondary | ICD-10-CM | POA: Diagnosis not present

## 2023-07-12 DIAGNOSIS — I3139 Other pericardial effusion (noninflammatory): Secondary | ICD-10-CM | POA: Diagnosis not present

## 2023-07-12 DIAGNOSIS — J189 Pneumonia, unspecified organism: Secondary | ICD-10-CM | POA: Diagnosis not present

## 2023-07-12 MED ORDER — PANTOPRAZOLE SODIUM 40 MG PO TBEC
40.0000 mg | DELAYED_RELEASE_TABLET | Freq: Every day | ORAL | Status: DC
  Administered 2023-07-12 – 2023-07-13 (×2): 40 mg via ORAL
  Filled 2023-07-12 (×2): qty 1

## 2023-07-12 MED ORDER — HYDROMORPHONE HCL 1 MG/ML IJ SOLN: 1.0000 mg | INTRAMUSCULAR | Status: DC | PRN

## 2023-07-12 MED ORDER — PREDNISONE 20 MG PO TABS
20.0000 mg | ORAL_TABLET | Freq: Every day | ORAL | Status: DC
  Administered 2023-07-12 – 2023-07-13 (×2): 20 mg via ORAL
  Filled 2023-07-12 (×2): qty 1

## 2023-07-12 MED ORDER — MORPHINE SULFATE (PF) 2 MG/ML IV SOLN: 1.0000 mg | INTRAVENOUS | Status: DC | PRN

## 2023-07-12 MED ORDER — OXYCODONE HCL 5 MG PO TABS
5.0000 mg | ORAL_TABLET | ORAL | Status: DC | PRN
  Filled 2023-07-12: qty 2

## 2023-07-12 NOTE — Plan of Care (Signed)
  Problem: Education: Goal: Knowledge of General Education information will improve Description: Including pain rating scale, medication(s)/side effects and non-pharmacologic comfort measures Outcome: Completed/Met

## 2023-07-12 NOTE — Care Management Obs Status (Signed)
MEDICARE OBSERVATION STATUS NOTIFICATION   Patient Details  Name: Todd Salazar MRN: [id number] Date of Birth: 02-09-1936   Medicare Observation Status Notification Given:  Yes    Tom-Johnson, Hershal Coria, RN 07/12/2023, 3:28 PM

## 2023-07-12 NOTE — TOC CM/SW Note (Signed)
Transition of Care [facility]) - Inpatient Brief Assessment   Patient Details  Name: Todd Salazar MRN: [id number] Date of Birth: [birth year]  [facility] of [facility]) CM/SW Contact:    Tom-Johnson, Hershal Coria, RN Phone Number: 07/12/2023, 3:37 PM   Clinical Narrative:  Patient presented to the ED with Chest Pain, found to have Multifocal Pneumonia. On IV abx. Recently admitted with Pericarditis from 07/01/23-/07/02/23. Cardiology following. Has a Pacemaker. On Eliquis for A-fib.   From home alone, has three supportive children. Retired, independent with care prior to admission. Has a cane, walker, w/c,shower seat, grab bars at home.  PCP is Florentina Addison, NP and uses [facility].  Medical workup continues.   CM will continue to follow as patient progresses with care towards discharge.       Transition of Care Asessment: Insurance and Status: Insurance coverage has been reviewed Patient has primary care physician: Yes Home environment has been reviewed: Yes Prior level of function:: Independent Prior/Current Home Services: No current home services Social Determinants of Health Reivew: SDOH reviewed no interventions necessary Readmission risk has been reviewed: Yes Transition of care needs: no transition of care needs at this time

## 2023-07-12 NOTE — Plan of Care (Signed)
  Problem: Activity: Goal: Ability to tolerate increased activity will improve Outcome: Progressing   Problem: Health Behavior/Discharge Planning: Goal: Ability to safely manage health-related needs after discharge will improve Outcome: Progressing   

## 2023-07-12 NOTE — Progress Notes (Signed)
Todd Salazar  ZOX:[id number] DOB: [birth year] DOA: 07/10/2023 PCP: Patient, No Pcp Per    Brief Narrative:  87 year old with a history of coronary artery disease, paroxysmal atrial fibrillation on Eliquis, HTN, tachybradycardia syndrome, and CKD stage III AA who underwent a redo ablation for atrial fibrillation 7/18 as an outpatient and required admission to the hospital 7/24 with concerns for pericarditis.  She was discharged 7/21 on colchicine therapy but returned 7/29 for this admission with complaints of ongoing chest pain.  CT chest 7/29 actually noted improving left lower lobe opacity compared to scan 7/20 which initially noted the left lower lobe consolidation.  Goals of Care:   Code Status: Full Code   DVT prophylaxis: apixaban (ELIQUIS) tablet 5 mg   Interim Hx: Afebrile.  Vital signs stable.  Oxygen saturation 96% on room air.  Continues to have uncontrolled chest pain, especially now that he has received his last dose of Toradol.  Reports severe intolerance to oral narcotics.  Denies shortness of breath.  Assessment & Plan:  Post ablation pericarditis -recurrent pericarditis Recent TTE noted only trivial effusion -was treated with colchicine without significant improvement -colchicine dose increased by cardiology -follow-up TTE this admission without new findings -we wish to avoid ongoing high-dose NSAID due to his concomitant use of Eliquis -add prednisone today as he is having persisting unimproved pain and there are no other reasonable options  Multifocal pneumonia - ruled out There was initially concern that this patient was suffering with ongoing multifocal pneumonia -follow-up CT chest actually notes improvement of her previously noted left lower lobe opacity -there is no evidence of pulmonary embolism -procalcitonin level is not suggestive of an acute bacterial infection -COVID-negative -discontinue antibiotic  CAD Asymptomatic presently  Paroxysmal atrial  fibrillation Continue usual Eliquis dose   Family Communication: No family present at time of exam Disposition: Adjust medical therapy -hopeful for discharge home 8/1 if pain improved/stabilized   Objective: Blood pressure (!) 101/50, pulse 72, temperature 98.4 F (36.9 C), temperature source Oral, resp. rate 18, height 6\' 3"  (1.905 m), weight 90 kg, SpO2 96%.  Intake/Output Summary (Last 24 hours) at 07/12/2023 0938 Last data filed at 07/12/2023 0300 Gross per 24 hour  Intake 374.05 ml  Output 0 ml  Net 374.05 ml   Filed Weights   07/10/23 1805  Weight: 90 kg    Examination: General: No acute respiratory distress Lungs: Clear to auscultation bilaterally without wheezes or crackles Cardiovascular: Regular rate and rhythm without murmur gallop or rub normal S1 and S2 Abdomen: Nontender, nondistended, soft, bowel sounds positive, no rebound, no ascites, no appreciable mass Extremities: No significant cyanosis, clubbing, or edema bilateral lower extremities  CBC: Recent Labs  Lab 07/10/23 1813 07/11/23 0429 07/12/23 0344  WBC 4.2 5.1 4.0  HGB 8.1* 8.4* 8.0*  HCT 25.5* 26.5* 24.2*  MCV 104.1* 106.0* 104.3*  PLT 291 266 244   Basic Metabolic Panel: Recent Labs  Lab 07/10/23 1813 07/11/23 0429 07/12/23 0344  NA 144 139 140  K 4.2 3.9 4.2  CL 109 110 109  CO2 24 21* 23  GLUCOSE 107* 97 92  BUN 15 13 14   CREATININE 1.20 1.18 1.26*  CALCIUM 8.7* 8.2* 8.0*  MG  --   --  1.8   GFR: Estimated Creatinine Clearance: 51.2 mL/min (A) (by C-G formula based on SCr of 1.26 mg/dL (H)).   Scheduled Meds:  apixaban  5 mg Oral BID   colchicine  0.6 mg Oral BID   gabapentin  400  mg Oral QHS   Continuous Infusions:  sodium chloride 10 mL/hr at 07/11/23 2221   azithromycin 500 mg (07/11/23 2318)   cefTRIAXone (ROCEPHIN)  IV 1 g (07/11/23 2222)     LOS: 0 days   Lonia Blood, MD [facility] Office  [phone] Pager - Text Page per Loretha Stapler  If 7PM-7AM,  please contact night-coverage per Amion 07/12/2023, 9:38 AM

## 2023-07-12 NOTE — Progress Notes (Signed)
Pt c/o 7/10 mid chest pain. States it is similar to when he came to the hospital on admission. Jetty Duhamel, MD notified. See new orders. Pt refused PRN Tylenol and new PRN oxycodone stating "Tylenol does not work for me. I cannot take any kind of codeine or pain medicine by mouth because it makes me sick". Jetty Duhamel, MD notified. See new order for prednisone.    1657: Per CCMD, pt had brief ST elevation for "a couple of seconds". Now back into sinus rhythm. Strip saved for MD to evaluate. CCMD also stated pt is pacing into the T wave. Jetty Duhamel, MD notified. No new orders at this time. Pt remains on telemetry.

## 2023-07-12 NOTE — Progress Notes (Signed)
   Patient Name: Todd Salazar Date of Encounter: 07/12/2023 [facility] Cardiologist: Will [name], MD   Interval Summary  .    87 y.o. male with medical history significant of CAD, paroxysmal atrial fibrillation/flutter on Eliquis, tachycardia-bradycardia syndrome s/p ST Jude dual chamber pacemaker, CKD IIIa, restrictive lung disease, who is admitted for pericarditis. He recently underwent re-do a fib ablation on 06/29/23 outpatient, admitted for post ablation pericarditis and discharged on 07/02/23 with colchicine 0.6mg  daily. He presented again 07/10/23 for worsening chest pain. CTA chest no PE, improving multifocal pneumonia (noted possible RLL pneumonia on CT 07/01/23 but antibiotic treatment was felt not indicated at the time). CRP  0.5 from 2.8 on 7/20. He was started on antibiotic for pneumonia this admission, cardiology is following for pericarditis.   Patient states he feels much improved, pain remains of his chest but less, no diarrhea, fever. He has not ambulated so far.    Vital Signs .    Vitals:   07/11/23 2010 07/12/23 0000 07/12/23 0442 07/12/23 0907  BP: 120/67 122/65 (!) 111/49 (!) 101/50  Pulse: 70 72 66 72  Resp: 20 16 18 18   Temp: 98.3 F (36.8 C) 98 F (36.7 C) 98 F (36.7 C) 98.4 F (36.9 C)  TempSrc: Oral Oral Oral Oral  SpO2: 96% 95% 96% 96%  Weight:      Height:        Intake/Output Summary (Last 24 hours) at 07/12/2023 0926 Last data filed at 07/12/2023 0300 Gross per 24 hour  Intake 374.05 ml  Output 0 ml  Net 374.05 ml      07/10/2023    6:05 PM 07/02/2023    4:47 AM 07/01/2023    1:37 AM  Last 3 Weights  Weight (lbs) 198 lb 6.6 oz 198 lb 12.8 oz 191 lb 12.8 oz  Weight (kg) 90 kg 90.175 kg 87 kg      Telemetry/ECG   -Sinus rhythm 70-80s, first degree AVB  - Personally Reviewed  Physical Exam .   GEN: No acute distress.   Neck: No JVD Cardiac: RRR,  no murmur  Respiratory: Clear to auscultation bilaterally. GI: Soft,  nontender, non-distended  MS: No leg edema  Assessment & Plan .     Chest pain  Pericarditis 2/2 recent A fib ablation  - Hs trop  10>10, not consistent with ACS, 2022 CT with normal coronary origin  - chest pain is likely from post ablation pericarditis + multifocal pneumonia  - CRP has normalized 0.5, from 2.8 on 07/01/23  - given persistent pain, colchicine increased to 0.6mg  BID this time for total duration of 3 months, tolerating   - unable to use NSAIDs due to concurrent use of eliquis; avoid steroid if able  - repeat limited Echo pending today   Persistent A fib - s/p re-do ablation 7/18/, remains in sinus now  - remains on Eliquis 5mg  BID for anticoagulation   Tachy-brady syndrome - St Jude dual chamber PPM in place, device interrogation from 06/08/23 normal     For questions or updates, please contact [facility] Please consult [email or web address] for contact info under        Signed, Cyndi Bender, NP

## 2023-07-13 ENCOUNTER — Other Ambulatory Visit (HOSPITAL_COMMUNITY): Payer: Self-pay

## 2023-07-13 DIAGNOSIS — Z79899 Other long term (current) drug therapy: Secondary | ICD-10-CM | POA: Diagnosis not present

## 2023-07-13 DIAGNOSIS — Z87891 Personal history of nicotine dependence: Secondary | ICD-10-CM | POA: Diagnosis not present

## 2023-07-13 DIAGNOSIS — Z950 Presence of cardiac pacemaker: Secondary | ICD-10-CM | POA: Diagnosis not present

## 2023-07-13 DIAGNOSIS — I495 Sick sinus syndrome: Secondary | ICD-10-CM | POA: Diagnosis present

## 2023-07-13 DIAGNOSIS — Z888 Allergy status to other drugs, medicaments and biological substances status: Secondary | ICD-10-CM | POA: Diagnosis not present

## 2023-07-13 DIAGNOSIS — I129 Hypertensive chronic kidney disease with stage 1 through stage 4 chronic kidney disease, or unspecified chronic kidney disease: Secondary | ICD-10-CM | POA: Diagnosis present

## 2023-07-13 DIAGNOSIS — I4819 Other persistent atrial fibrillation: Secondary | ICD-10-CM | POA: Diagnosis present

## 2023-07-13 DIAGNOSIS — Z8546 Personal history of malignant neoplasm of prostate: Secondary | ICD-10-CM | POA: Diagnosis not present

## 2023-07-13 DIAGNOSIS — J189 Pneumonia, unspecified organism: Secondary | ICD-10-CM | POA: Diagnosis not present

## 2023-07-13 DIAGNOSIS — I319 Disease of pericardium, unspecified: Secondary | ICD-10-CM

## 2023-07-13 DIAGNOSIS — Z882 Allergy status to sulfonamides status: Secondary | ICD-10-CM | POA: Diagnosis not present

## 2023-07-13 DIAGNOSIS — Z7901 Long term (current) use of anticoagulants: Secondary | ICD-10-CM | POA: Diagnosis not present

## 2023-07-13 DIAGNOSIS — Z8249 Family history of ischemic heart disease and other diseases of the circulatory system: Secondary | ICD-10-CM | POA: Diagnosis not present

## 2023-07-13 DIAGNOSIS — N1831 Chronic kidney disease, stage 3a: Secondary | ICD-10-CM | POA: Diagnosis present

## 2023-07-13 DIAGNOSIS — I4510 Unspecified right bundle-branch block: Secondary | ICD-10-CM | POA: Diagnosis present

## 2023-07-13 DIAGNOSIS — I2510 Atherosclerotic heart disease of native coronary artery without angina pectoris: Secondary | ICD-10-CM | POA: Diagnosis present

## 2023-07-13 DIAGNOSIS — R0789 Other chest pain: Secondary | ICD-10-CM | POA: Diagnosis present

## 2023-07-13 DIAGNOSIS — I441 Atrioventricular block, second degree: Secondary | ICD-10-CM | POA: Diagnosis present

## 2023-07-13 DIAGNOSIS — Z1152 Encounter for screening for COVID-19: Secondary | ICD-10-CM | POA: Diagnosis not present

## 2023-07-13 DIAGNOSIS — Z9049 Acquired absence of other specified parts of digestive tract: Secondary | ICD-10-CM | POA: Diagnosis not present

## 2023-07-13 DIAGNOSIS — Z885 Allergy status to narcotic agent status: Secondary | ICD-10-CM | POA: Diagnosis not present

## 2023-07-13 DIAGNOSIS — Z881 Allergy status to other antibiotic agents status: Secondary | ICD-10-CM | POA: Diagnosis not present

## 2023-07-13 DIAGNOSIS — I3139 Other pericardial effusion (noninflammatory): Secondary | ICD-10-CM | POA: Diagnosis present

## 2023-07-13 MED ORDER — MORPHINE SULFATE (PF) 2 MG/ML IV SOLN: 1.0000 mg | INTRAVENOUS | Status: DC | PRN

## 2023-07-13 MED ORDER — COLCHICINE 0.6 MG PO TABS
0.6000 mg | ORAL_TABLET | Freq: Two times a day (BID) | ORAL | 0 refills | Status: DC
  Filled 2023-07-13: qty 180, 90d supply, fill #0

## 2023-07-13 MED ORDER — PREDNISONE 20 MG PO TABS
ORAL_TABLET | ORAL | 0 refills | Status: DC
  Filled 2023-07-13: qty 21, 28d supply, fill #0

## 2023-07-13 MED ORDER — IBUPROFEN 400 MG PO TABS: 200.0000 mg | ORAL_TABLET | Freq: Four times a day (QID) | ORAL | Status: DC | PRN

## 2023-07-13 MED ORDER — IBUPROFEN 200 MG PO TABS: 200.0000 mg | ORAL_TABLET | Freq: Three times a day (TID) | ORAL | Status: DC | PRN

## 2023-07-13 MED ORDER — PANTOPRAZOLE SODIUM 40 MG PO TBEC
40.0000 mg | DELAYED_RELEASE_TABLET | Freq: Every day | ORAL | 0 refills | Status: DC
  Filled 2023-07-13: qty 60, 60d supply, fill #0

## 2023-07-13 MED ORDER — ACETAMINOPHEN 325 MG PO TABS: 650.0000 mg | ORAL_TABLET | Freq: Four times a day (QID) | ORAL | Status: DC | PRN

## 2023-07-13 MED ORDER — IBUPROFEN 400 MG PO TABS: 200.0000 mg | ORAL_TABLET | Freq: Three times a day (TID) | ORAL | Status: DC | PRN

## 2023-07-13 NOTE — Progress Notes (Signed)
  EP asked to interrogate device due to apparent Atrial pacing spikes on T waves on telemetry.   Interrogation reveals upper rate behavior with overly sensitive rate response and 2:1 AV block at rates as low as 90 bpm.   PVAB sensing adjusted Sensor turned off Trigger to record tachycardia changed to 150 bpm.   Underlying is NSR in 70s.   Changes made and EP follow up as scheduled.   Reviewed with Dr. Nelly Laurence and Dr. Eden Emms and OK to use NSAIDs in addition to colchicine to get pt through this acute period.   Please call with any further questions.   Casimiro Needle [address]" [place], PA-C  07/13/2023 8:02 AM

## 2023-07-13 NOTE — Discharge Summary (Signed)
DISCHARGE SUMMARY  Todd Salazar  MR#: [id number]  DOB:Dec 07, 1936  Date of Admission: 07/10/2023 Date of Discharge: 07/13/2023  Attending Physician:Devonna Oboyle Silvestre Gunner, MD  Patient's YQM:VHQIONG, No Pcp Per  Consults: Cardiology / EP  Disposition: D/C home   Follow-up Appts:  Follow-up Information     Electrophysiology Team Follow up.   Why: Keep your previously arranged appointment in the Electrophysiology/Cardiology clinic.                Discharge Diagnoses: Post ablation pericarditis -recurrent pericarditis - chest pain  Multifocal pneumonia - ruled out CAD Paroxysmal atrial fibrillation  Initial presentation: 87 year old with a history of coronary artery disease, paroxysmal atrial fibrillation on Eliquis, HTN, tachybradycardia syndrome, and CKD stage III AA who underwent a redo ablation for atrial fibrillation 7/18 as an outpatient and required admission to the hospital 7/24 with concerns for pericarditis. He was discharged 7/21 on colchicine therapy but returned 7/29 for this admission with complaints of ongoing chest pain. CT chest 7/29 actually noted improving left lower lobe opacity compared to scan 7/20 which initially noted the left lower lobe consolidation.   Hospital Course:  Post ablation pericarditis -recurrent pericarditis Recent TTE noted only trivial effusion -was treated with colchicine without significant improvement -colchicine dose increased by cardiology this admit  -follow-up TTE this admission without new findings -we wish to avoid ongoing high-dose NSAID due to his concomitant use of Eliquis - added prednisone with good results as he was having persisting unimproved pain - allowed use of low dose NSAID for a limited course, along with a PPI   Multifocal pneumonia - ruled out There was initially concern that this patient was suffering with ongoing multifocal pneumonia -follow-up CT chest actually notes improvement of her previously noted left lower  lobe opacity -there is no evidence of pulmonary embolism -procalcitonin level was not suggestive of an acute bacterial infection -COVID-negative -discontinued antibiotic   CAD Asymptomatic presently   Paroxysmal atrial fibrillation Continue usual Eliquis dose    Allergies as of 07/13/2023       Reactions   Ciprofloxacin Anaphylaxis, Other (See Comments)   Dizziness, also   Tramadol Swelling, Other (See Comments)   Laryngeal edema   Duloxetine Other (See Comments)   Delusions   Hydrocodone-acetaminophen Nausea And Vomiting   Nisoldipine Other (See Comments)   Pt unsure of reaction    Benadryl [diphenhydramine] Anxiety, Other (See Comments)   Panic attack    Methocarbamol Nausea Only   Ondansetron Hcl Nausea Only   Sulfa Antibiotics Rash        Medication List     TAKE these medications    acetaminophen 325 MG tablet Commonly known as: TYLENOL Take 2 tablets (650 mg total) by mouth every 6 (six) hours as needed for mild pain, fever or headache. What changed:  medication strength how much to take when to take this reasons to take this   colchicine 0.6 MG tablet Take 1 tablet (0.6 mg total) by mouth 2 (two) times daily. What changed: when to take this   Eliquis 5 MG Tabs tablet Generic drug: apixaban TAKE 1 TABLET BY MOUTH 2 TIMES A DAY   gabapentin 300 MG capsule Commonly known as: NEURONTIN Take 300 mg by mouth See admin instructions. Take with 100 mg for a total of 400 mg at bedtime   gabapentin 100 MG capsule Commonly known as: NEURONTIN Take 100 mg by mouth See admin instructions. Take with 300 mg for a total of 400 mg at bedtime  ibuprofen 200 MG tablet Commonly known as: ADVIL Take 1 tablet (200 mg total) by mouth every 8 (eight) hours as needed for moderate pain.   nitroGLYCERIN 0.4 MG SL tablet Commonly known as: NITROSTAT Place 0.4 mg under the tongue every 5 (five) minutes as needed for chest pain.   pantoprazole 40 MG tablet Commonly known  as: PROTONIX Take 1 tablet (40 mg total) by mouth daily. Start taking on: July 14, 2023   predniSONE 20 MG tablet Commonly known as: DELTASONE Take 1 tablet a day for 14 days, then 1/2 tablet a day for 14 days, then stop Start taking on: July 14, 2023   TUMS PO Take 4 tablets by mouth as needed (discomfort).        Day of Discharge BP (!) 113/54 (BP Location: Right Arm)   Pulse 75   Temp 97.8 F (36.6 C)   Resp 18   Ht 6\' 3"  (1.905 m)   Wt 90 kg   SpO2 95%   BMI 24.80 kg/m   Physical Exam: General: No acute respiratory distress Lungs: Clear to auscultation bilaterally without wheezes or crackles Cardiovascular: Regular rate and rhythm without murmur gallop or rub normal S1 and S2 Abdomen: Nontender, nondistended, soft, bowel sounds positive, no rebound, no ascites, no appreciable mass Extremities: No significant cyanosis, clubbing, or edema bilateral lower extremities  Basic Metabolic Panel: Recent Labs  Lab 07/10/23 1813 07/11/23 0429 07/12/23 0344 07/13/23 0641  NA 144 139 140 137  K 4.2 3.9 4.2 4.7  CL 109 110 109 107  CO2 24 21* 23 24  GLUCOSE 107* 97 92 124*  BUN 15 13 14 17   CREATININE 1.20 1.18 1.26* 1.26*  CALCIUM 8.7* 8.2* 8.0* 8.4*  MG  --   --  1.8  --    CBC: Recent Labs  Lab 07/10/23 1813 07/11/23 0429 07/12/23 0344  WBC 4.2 5.1 4.0  HGB 8.1* 8.4* 8.0*  HCT 25.5* 26.5* 24.2*  MCV 104.1* 106.0* 104.3*  PLT 291 266 244   Time spent in discharge (includes decision making & examination of pt): 35 minutes  07/13/2023, 11:34 AM   Lonia Blood, MD [facility] Office  [phone]

## 2023-07-13 NOTE — Progress Notes (Signed)
   Patient Name: Todd Salazar Date of Encounter: 07/13/2023 [facility] Cardiologist: Will [name], MD   Interval Summary  .    Doing well wants to go home Does not want narcotics for pain PPM reprogrammed   Vital Signs .    Vitals:   07/12/23 0907 07/12/23 1710 07/12/23 2121 07/13/23 0423  BP: (!) 101/50 122/63 117/65 122/66  Pulse: 72 75 68 77  Resp: 18 18 18 18   Temp: 98.4 F (36.9 C) 98.6 F (37 C) 98.2 F (36.8 C) 97.6 F (36.4 C)  TempSrc: Oral   Oral  SpO2: 96% 100%  95%  Weight:      Height:        Intake/Output Summary (Last 24 hours) at 07/13/2023 0821 Last data filed at 07/13/2023 0600 Gross per 24 hour  Intake 600 ml  Output 900 ml  Net -300 ml      07/10/2023    6:05 PM 07/02/2023    4:47 AM 07/01/2023    1:37 AM  Last 3 Weights  Weight (lbs) 198 lb 6.6 oz 198 lb 12.8 oz 191 lb 12.8 oz  Weight (kg) 90 kg 90.175 kg 87 kg      Telemetry/ECG   -Sinus rhythm 70-80s, first degree AVB  - Personally Reviewed  Physical Exam .   GEN: No acute distress.   Neck: No JVD Cardiac: RRR,  no murmur  Respiratory: Clear to auscultation bilaterally. GI: Soft, nontender, non-distended  MS: No leg edema  Assessment & Plan .     Chest pain  Pericarditis 2/2 recent A fib ablation  - Hs trop  10>10, not consistent with ACS, 2022 CT with normal coronary origin  - chest pain is likely from post ablation pericarditis + multifocal pneumonia  - CRP has normalized 0.5, from 2.8 on 07/01/23  - given persistent pain, colchicine increased to 0.6mg  BID this time for total duration of 3 months, tolerating   - discussed with EP ok to use motrin low dose 200 mg tid and patient ok with this would do this for 4 weeks with protonix   Persistent A fib - s/p re-do ablation 7/18/, remains in sinus now  - remains on Eliquis 5mg  BID for anticoagulation   Tachy-brady syndrome - St Jude dual chamber PPM in place Had abnormal upper atrial rate behavior. Not dangerous as  it was atrial not ventricular spike on T waves PPM reprogrammed this am Appreciate EP help  Ok for d/c from cardiology perspective Already has EP f/u     For questions or updates, please contact  [facility] Please consult [email or web address] for contact info under        Signed, Charlton Haws, MD

## 2023-07-13 NOTE — TOC Transition Note (Signed)
Transition of Care [facility]) - CM/SW Discharge Note   Patient Details  Name: Todd Salazar MRN: [id number] Date of Birth: 12-15-1935  [facility] CM/SW Contact:  Tom-Johnson, Hershal Coria, RN Phone Number: 07/13/2023, 12:35 PM   Clinical Narrative:     Patient is scheduled for discharge today.  Readmission Risk Assessment done. Outpatient f/u, hospital f/u and discharge instructions on AVS. Prescriptions sent to [facility] pharmacy and meds will be delivered to patient at bedside prior discharge. No TOC needs or recommendations noted. Family to transport at discharge.  No further TOC needs noted.       Final next level of care: Home/Self Care Barriers to Discharge: Barriers Resolved   Patient Goals and CMS Choice CMS Medicare.gov Compare Post Acute Care list provided to:: Patient Choice offered to / list presented to : NA  Discharge Placement                  Patient to be transferred to facility by: Family      Discharge Plan and Services Additional resources added to the After Visit Summary for                  DME Arranged: N/A DME Agency: NA       HH Arranged: NA HH Agency: NA        Social Determinants of Health (SDOH) Interventions SDOH Screenings   Food Insecurity: Low Risk  (03/18/2023)   Received from [facility], [facility]  Transportation Needs: No Transportation Needs (03/18/2023)   [facility]  Utilities: Low Risk  (03/18/2023)   [facility]  Tobacco Use: Medium Risk (07/10/2023)     Readmission Risk Interventions    07/13/2023   12:34 PM  Readmission Risk Prevention Plan  Post Dischage Appt Complete  Medication Screening Complete  Transportation Screening Complete

## 2023-07-21 ENCOUNTER — Other Ambulatory Visit: Payer: Self-pay | Admitting: Student

## 2023-07-21 ENCOUNTER — Telehealth: Payer: Self-pay | Admitting: Cardiology

## 2023-07-21 NOTE — Telephone Encounter (Addendum)
Spoke to Ruby, [place] who ordered this last. Says pt needs to be seen before we will agree to restart Lasix. (Can see gen card app/ep app) Will forward to triage to address w/ pt (call dtr) next week.

## 2023-07-21 NOTE — Telephone Encounter (Signed)
Left message to call back  

## 2023-07-21 NOTE — Telephone Encounter (Signed)
Pt c/o medication issue:  1. Name of Medication:   colchicine 0.6 MG tablet   2. How are you currently taking this medication (dosage and times per day)?    Daughter stated patient is taking this medication once daily instead of twice daily  3. Are you having a reaction (difficulty breathing--STAT)?   4. What is your medication issue?   Daughter returned staff call wanting to report how patient is taking this medication.

## 2023-07-21 NOTE — Telephone Encounter (Signed)
Patient's daughter is returning call.

## 2023-07-21 NOTE — Telephone Encounter (Signed)
Pt's daughter would like a callback regarding pt retaining fluid. She states that pt was discharged from the hospital last week weighing 192, today she says he weighs 198. Daughter states that she did give pt one of her fluid pills being that the hospital did not prescribe him any. Please advise

## 2023-07-21 NOTE — Telephone Encounter (Signed)
Dtr spoke to dad. Dad stopped taking his Prednisone b/c if was making him jittery. Complaining of chest fullness/discomfort.  Reviewed w/ DOD, Dr. Nelly Laurence. Per MD: Advised dtr to have pt restart Prednisone today.  Educated that its purpose is greater than the Colchicine, although both would be helpful in his current condition. Dtr reported pt having a lot of diarrhea.  Advised to cut Colchicine down to once daily to see if this helps. Advised can take Ibuprofen 200 mg every 4 hours PRN for next several days if needed to aid in discomfort.  Dtr verbalized understanding and agreeable to plan.

## 2023-07-27 ENCOUNTER — Encounter (HOSPITAL_COMMUNITY): Payer: Self-pay | Admitting: Physician Assistant

## 2023-07-27 ENCOUNTER — Ambulatory Visit (HOSPITAL_COMMUNITY)
Admission: RE | Admit: 2023-07-27 | Discharge: 2023-07-27 | Disposition: A | Discharge status: Home / Self Care | Payer: PPO | Source: Ambulatory Visit | Attending: Physician Assistant | Admitting: Physician Assistant

## 2023-07-27 VITALS — BP 102/66 | HR 76 | Ht 75.0 in | Wt 200.0 lb

## 2023-07-27 DIAGNOSIS — Z950 Presence of cardiac pacemaker: Secondary | ICD-10-CM | POA: Diagnosis not present

## 2023-07-27 DIAGNOSIS — I959 Hypotension, unspecified: Secondary | ICD-10-CM | POA: Diagnosis not present

## 2023-07-27 DIAGNOSIS — I319 Disease of pericardium, unspecified: Secondary | ICD-10-CM | POA: Diagnosis not present

## 2023-07-27 DIAGNOSIS — I4819 Other persistent atrial fibrillation: Secondary | ICD-10-CM | POA: Insufficient documentation

## 2023-07-27 DIAGNOSIS — Z7952 Long term (current) use of systemic steroids: Secondary | ICD-10-CM | POA: Diagnosis not present

## 2023-07-27 DIAGNOSIS — Z7901 Long term (current) use of anticoagulants: Secondary | ICD-10-CM | POA: Diagnosis not present

## 2023-07-27 DIAGNOSIS — R001 Bradycardia, unspecified: Secondary | ICD-10-CM | POA: Diagnosis not present

## 2023-07-27 DIAGNOSIS — Z79899 Other long term (current) drug therapy: Secondary | ICD-10-CM | POA: Diagnosis not present

## 2023-07-27 DIAGNOSIS — I4892 Unspecified atrial flutter: Secondary | ICD-10-CM | POA: Insufficient documentation

## 2023-07-27 DIAGNOSIS — D6869 Other thrombophilia: Secondary | ICD-10-CM | POA: Insufficient documentation

## 2023-07-27 NOTE — Progress Notes (Signed)
Primary Care Physician: Todd Salazar, No Pcp Per Primary Cardiologist: Dr Rudolpho Sevin Primary Electrophysiologist: Dr Elberta Fortis  Referring Physician: Dr Dara Hoyer Dobos is a 87 y.o. male with a history of bronchiectasis/restrictive lung disease, symptomatic bradycardia s/p PPM, orthostatic hypotension, prostate cancer, CKD, atrial tachycardia, atrial fibrillation who presents for follow up in the [facility]. Todd Salazar is on Eliquis for a CHADS2VASC score of 2. Todd Salazar recently hospitalized for afib with RVR and loaded on dofetilide. He converted with the medication and did not require DCCV. However, he presented to the ED 08/23/21 with  rapid afib. He was about to undergo DCCV when he spontaneously converted to SR.   Todd Salazar is s/p afib ablation with Dr Elberta Fortis on 10/29/21. He did present to the ED 10/31/21 with CP and was diagnosed with pericarditis, started on colchicine. His CP has resolved.   He had several ED visits May and June 2023 for hip and back pain and had back surgery on 05/16/22. He presented to the ED 05/30/22 with afib with RVR and underwent TEE guided DCCV on 06/01/22. He continued to have symptomatic breakthrough episodes of afib and underwent repeat ablation on 06/29/23. He presented to the ED two days after his ablation with acute pericarditis and was started on colchicine. He returned to the ED one week later with continued chest pain and was started on prednisone as well.  On follow up today, Todd Salazar reports that he has done well since then. His chest discomfort is still present but it has greatly improved. He feels he has been staying in SR. He denies groin issues or swallowing pain.   Today, he denies symptoms of palpitations, shortness of breath, orthopnea, PND, lower extremity edema, presyncope, syncope, snoring, daytime somnolence, bleeding, or neurologic sequela. The Todd Salazar is tolerating medications without difficulties and is otherwise without  complaint today.    Atrial Fibrillation Risk Factors:  he does not have symptoms or diagnosis of sleep apnea. he does not have a history of rheumatic fever.   Atrial Fibrillation Management history:  Previous antiarrhythmic drugs: flecainide, sotalol, propafenone, amiodarone, dofetilide   Previous cardioversions: 06/01/22 Previous ablations: 10/29/21, 06/29/23 Anticoagulation history: Eliquis   Past Medical History:  Diagnosis Date   Arthritis    Atrial fibrillation (HCC)    Cancer (HCC)    prostate   Dysrhythmia    afib   History of kidney stones     Current Outpatient Medications  Medication Sig Dispense Refill   acetaminophen (TYLENOL) 325 MG tablet Take 2 tablets (650 mg total) by mouth every 6 (six) hours as needed for mild pain, fever or headache.     Calcium Carbonate Antacid (TUMS PO) Take 4 tablets by mouth as needed (discomfort).     colchicine 0.6 MG tablet Take 1 tablet (0.6 mg total) by mouth 2 (two) times daily. 180 tablet 0   ELIQUIS 5 MG TABS tablet TAKE 1 TABLET BY MOUTH 2 TIMES A DAY 180 tablet 1   gabapentin (NEURONTIN) 100 MG capsule Take 100 mg by mouth See admin instructions. Take with 300 mg for a total of 400 mg at bedtime     gabapentin (NEURONTIN) 300 MG capsule Take 300 mg by mouth See admin instructions. Take with 100 mg for a total of 400 mg at bedtime     ibuprofen (ADVIL) 200 MG tablet Take 1 tablet (200 mg total) by mouth every 8 (eight) hours as needed for moderate pain.     nitroGLYCERIN (NITROSTAT) 0.4  MG SL tablet Place 0.4 mg under the tongue every 5 (five) minutes as needed for chest pain.     pantoprazole (PROTONIX) 40 MG tablet Take 1 tablet (40 mg total) by mouth daily. 60 tablet 0   predniSONE (DELTASONE) 20 MG tablet Take 1 tablet a day for 14 days, then 1/2 tablet a day for 14 days, then stop 21 tablet 0   No current facility-administered medications for this encounter.    ROS- All systems are reviewed and negative except as per  the HPI above.  Physical Exam: Vitals:   07/27/23 0827  BP: 102/66  Pulse: 76  Weight: 90.7 kg  Height: 6\' 3"  (1.905 m)    GEN: Well nourished, well developed in no acute distress NECK: No JVD; No carotid bruits CARDIAC: Regular rate and rhythm, no murmurs, rubs, gallops RESPIRATORY:  Clear to auscultation without rales, wheezing or rhonchi  ABDOMEN: Soft, non-tender, non-distended EXTREMITIES:  No edema; No deformity    Wt Readings from Last 3 Encounters:  07/27/23 90.7 kg  07/10/23 90 kg  07/02/23 90.2 kg    EKG today demonstrates  SR, PAC Vent. rate 76 BPM PR interval 186 ms QRS duration 108 ms QT/QTcB 414/465 ms  Echo 07/12/23 demonstrated   1. Trivial pericardial effusion.   2. Left ventricular ejection fraction, by estimation, is 55 to 60%. The  left ventricle has normal function. The left ventricular internal cavity  size was mildly dilated.   3. There is no evidence of cardiac tamponade.   4. Mild mitral valve regurgitation.   5. Tricuspid valve regurgitation is moderate to severe.   6. There is mildly elevated pulmonary artery systolic pressure. The  estimated right ventricular systolic pressure is 44.0 mmHg.   7. The inferior vena cava is dilated in size with <50% respiratory  variability, suggesting right atrial pressure of 15 mmHg.   Epic records are reviewed at length today  CHA2DS2-VASc Score = 2  The Todd Salazar's score is based upon: CHF History: 0 HTN History: 0 Diabetes History: 0 Stroke History: 0 Vascular Disease History: 0 Age Score: 2 Gender Score: 0       ASSESSMENT AND PLAN: Persistent Atrial Fibrillation/atrial flutter The Todd Salazar's CHA2DS2-VASc score is 2, indicating a 2.2% annual risk of stroke.   Recall he has not tolerated rate control in the past due to hypotension. S/p afib ablation 10/29/21 and 06/29/23 Todd Salazar appears to be maintaining SR Continue Eliquis 5 mg BID  Secondary Hypercoagulable State (ICD10:  D68.69) The  Todd Salazar is at significant risk for stroke/thromboembolism based upon his CHA2DS2-VASc Score of 2.  Continue Apixaban (Eliquis).   Symptomatic bradycardia S/p PPM, followed by Dr Elberta Fortis and the device clinic  Pericarditis  Continue colchicine and prednisone  Chest pain greatly improved.    Follow up with Dr Elberta Fortis as scheduled.    [name] PA-C [facility] [address] [place], [place] [address] [phone] 07/27/2023 9:00 AM

## 2023-07-28 NOTE — Telephone Encounter (Signed)
Pt not currently taking.

## 2023-09-07 ENCOUNTER — Ambulatory Visit (INDEPENDENT_AMBULATORY_CARE_PROVIDER_SITE_OTHER): Payer: PPO

## 2023-09-07 DIAGNOSIS — I495 Sick sinus syndrome: Secondary | ICD-10-CM | POA: Diagnosis not present

## 2023-09-07 LAB — CUP PACEART REMOTE DEVICE CHECK
Battery Remaining Longevity: 94 mo
Battery Remaining Percentage: 74 %
Battery Voltage: 3.01 V
Brady Statistic AP VP Percent: 1 %
Brady Statistic AP VS Percent: 5.7 %
Brady Statistic AS VP Percent: 1 %
Brady Statistic AS VS Percent: 94 %
Brady Statistic RA Percent Paced: 4.8 %
Brady Statistic RV Percent Paced: 1 %
Date Time Interrogation Session: 20240926040035
Implantable Lead Connection Status: 753985
Implantable Lead Connection Status: 753985
Implantable Lead Implant Date: 20211011
Implantable Lead Implant Date: 20211011
Implantable Lead Location: 753859
Implantable Lead Location: 753860
Implantable Pulse Generator Implant Date: 20211011
Lead Channel Impedance Value: 360 Ohm
Lead Channel Impedance Value: 490 Ohm
Lead Channel Pacing Threshold Amplitude: 0.75 V
Lead Channel Pacing Threshold Amplitude: 0.875 V
Lead Channel Pacing Threshold Pulse Width: 0.5 ms
Lead Channel Pacing Threshold Pulse Width: 0.5 ms
Lead Channel Sensing Intrinsic Amplitude: 2.8 mV
Lead Channel Sensing Intrinsic Amplitude: 9.5 mV
Lead Channel Setting Pacing Amplitude: 1 V
Lead Channel Setting Pacing Amplitude: 1.875
Lead Channel Setting Pacing Pulse Width: 0.5 ms
Lead Channel Setting Sensing Sensitivity: 2 mV
Pulse Gen Model: 2272
Pulse Gen Serial Number: 3869163

## 2023-09-19 NOTE — Progress Notes (Signed)
Remote pacemaker transmission.   

## 2023-10-01 NOTE — Progress Notes (Deleted)
Electrophysiology Office Note:   Date:  10/01/2023  ID:  Cahlil Dennie, DOB May 06, 1936, MRN [id number]  Primary Cardiologist: None Electrophysiologist: Avanti Jetter [name], MD  {Click to update primary MD,subspecialty MD or APP then REFRESH:1}    History of Present Illness:   Todd Salazar is a 87 y.o. male with h/o atrial fibrillation/flutter, bronchiectasis with restrictive disease, symptomatic bradycardia, orthostatic hypotension, prostate cancer, CKD stage III seen today for routine electrophysiology followup.   Since last being seen in our clinic the patient reports doing ***.  he denies chest pain, palpitations, dyspnea, PND, orthopnea, nausea, vomiting, dizziness, syncope, edema, weight gain, or early satiety.   Review of systems complete and found to be negative unless listed in HPI.      EP Information / Studies Reviewed:    {EKGtoday:28818}      PPM Interrogation-  reviewed in detail today,  See PACEART report.  Device History: Abbott Dual Chamber PPM implanted *** for Symptomatic bradycardia  Risk Assessment/Calculations:   {Does this patient have ATRIAL FIBRILLATION?:[phone]} No BP recorded.  {Refresh Note OR Click here to enter BP  :1}***        Physical Exam:   VS:  There were no vitals taken for this visit.   Wt Readings from Last 3 Encounters:  07/27/23 200 lb (90.7 kg)  07/10/23 198 lb 6.6 oz (90 kg)  07/02/23 198 lb 12.8 oz (90.2 kg)     GEN: Well nourished, well developed in no acute distress NECK: No JVD; No carotid bruits CARDIAC: {EPRHYTHM:28826}, no murmurs, rubs, gallops RESPIRATORY:  Clear to auscultation without rales, wheezing or rhonchi  ABDOMEN: Soft, non-tender, non-distended EXTREMITIES:  No edema; No deformity   ASSESSMENT AND PLAN:    Symptomatic bradycardia s/p Abbott PPM  Normal PPM function See Pace Art report No changes today  2.  Persistent atrial fibrillation/flutter: Post ablation 06/29/2023.***  3.  Restrictive lung  disease: No shortness of breath.  Avoiding amiodarone  4.  Secondary hypercoagulable state: Currently on Eliquis for atrial fibrillation  5.  PVCs: 11% burden on monitor.  No changes.  Disposition:   Follow up with {EPPROVIDERS:28135} {EPFOLLOW IH:47425}  Signed, Graylin Sperling [name], MD

## 2023-10-03 ENCOUNTER — Ambulatory Visit: Payer: PPO | Attending: Cardiology | Admitting: Cardiology

## 2023-10-04 ENCOUNTER — Encounter: Payer: Self-pay | Admitting: Cardiology

## 2023-10-11 ENCOUNTER — Other Ambulatory Visit: Payer: Self-pay | Admitting: Cardiology

## 2023-10-11 DIAGNOSIS — I480 Paroxysmal atrial fibrillation: Secondary | ICD-10-CM

## 2023-10-11 NOTE — Telephone Encounter (Signed)
Prescription refill request for Eliquis received. Indication: AF Last office visit: 07/27/23  C Fenton PA Scr: 1.26 on 10/03/23  Epic Age: 87 Weight: 90.7kg  Based on above findings Eliquis 5ng twice daily is the appropriate dose.  Refill approved.

## 2023-11-05 NOTE — Progress Notes (Unsigned)
Cardiology Office Note Date:  11/05/2023  Patient ID:  Todd Salazar, DOB December 02, 1936, MRN [id number] PCP:  Patient, No Pcp Per  Cardiologist:  Dr. Rudolpho Sevin Electrophysiologist: Dr. Elberta Fortis     Chief Complaint:   post ablation visit  History of Present Illness: Todd Salazar is a 87 y.o. male with history of of persistent Afib, Atach, bronchiectasis/restrictive lung disease, symptomatic bradycardia with PPM, orthostatic hypotension, prostate cancer, CKD (III).  -----  I saw him 10/13/22 About June or so, just after his last DCCV he has felt unusually fatigued, tired. He has a constant sense of feeling weak, tired, sometimes perhaps off balance. Not near syncopal, no syncope, not dizzy. No CP Perhaps some palpitations on occasion Gets winded with longer walks, maybe not new for him He tried to walk every day for exercise No bleeding or signs of bleeding. He can not recall specifics on how prior meds failed or were not tolerated, "has been tried on so many" Discussed with Dr. Elberta Fortis, could consider retrying flecainide, try mexiletine perhaps for his PVCs Ultimately decided to try low dose Toprol in effort to reduce his PVC burden He established with new PMD team 11/30/22, started on Requip for RLS, started pepcid and protonix  I saw him 12/15/22,  He is accompanied by family today He continues to feel poor. Tired, no energy, day-in-day-out exhausted, persistent sens of just not feeling right, maybe lightheaded. He did not tolerate Toprol, with low BP He did not tolerate the Requip his PMD prescribed, made him feel very SOB, almost called 911. No bleeding or signs of bleeding After more discussion, he is not sleeping.  Goes to sleep about 10pm, wakes about midnight and is up until 10pm again the next night. He is not napping/sleeping during the day He does not know what wakes him or why he can't get back to sleep. 1% AFib burden 5% PVCs, not felt to contribute to his fatigue, no  changes were made.  Saw Dr Elberta Fortis 03/20/23, had a hospitalization at [facility] for AFib, felt poorly with it, though low burden 1.7% Amiodarone started and planned for re-do ablation with efforts to avoid amiodarone given his restrictive lung disease  PVI ablation 06/29/23 > amiodarone stopped post procedure   TODAY He is accompanied by his daughter in law, he is doing well, does not think he has had any Afib Has a terrible R knee, got gel injection last week, hoping for some relief soon.  Tweaked his back yesterday, orthopedic issues are his primary limiter.  Was admitted for PNA post ablation a few days, otherwise no post ablation concerns, groin sites healed well, no trouble swallowing  Has an occasional transient heaviness in his low chest when he first gets into bed (for quite a while, pre-date ablation), none with exertion. No unusual SOB No near syncope or syncope. No bleeding or signs of bleeding   Device information Abbott dual chamber PPM implanted 09/21/2020  AAD Hx failed sotalol, flecainide, and propafenone, and apparently intolerant of nodal blocking agents Tikosyn Sept 2022 >>failed to maintain SR stopped Sept 2022 PVI ablation 10/29/2021 (tx with colchicine post procedure for pericarditis) Amiodarone started Sept 2022 >> stopped post PVI ablation Amiodarone restarted 03/20/23 >> stopped July post ablation PVI ablation 06/29/23   Past Medical History:  Diagnosis Date   Arthritis    Atrial fibrillation (HCC)    Cancer (HCC)    prostate   Dysrhythmia    afib   History of kidney stones  Past Surgical History:  Procedure Laterality Date   ATRIAL FIBRILLATION ABLATION N/A 10/29/2021   Procedure: ATRIAL FIBRILLATION ABLATION;  Surgeon: Regan Lemming, MD;  Location: [facility] INVASIVE CV LAB;  Service: Cardiovascular;  Laterality: N/A;   ATRIAL FIBRILLATION ABLATION N/A 06/29/2023   Procedure: ATRIAL FIBRILLATION ABLATION;  Surgeon: Regan Lemming, MD;  Location:  [facility] INVASIVE CV LAB;  Service: Cardiovascular;  Laterality: N/A;   Bone spur     CARDIOVERSION N/A 06/01/2022   Procedure: CARDIOVERSION;  Surgeon: Little Ishikawa, MD;  Location: [facility] ENDOSCOPY;  Service: Cardiovascular;  Laterality: N/A;   CHOLECYSTECTOMY     HERNIA REPAIR     JOINT REPLACEMENT     kidney stones     LAMINECTOMY WITH POSTERIOR LATERAL ARTHRODESIS LEVEL 1 Right 11/25/2019   Procedure: Laminectomy and Foraminotomy  Lumbar Four-Lumbar Five - right, with instrumented fusion;  Surgeon: Tia Alert, MD;  Location: [facility] OR;  Service: Neurosurgery;  Laterality: Right;  Laminectomy and Foraminotomy  Lumbar Four-Lumbar Five - right, with instrumented fusion   PROSTATE SURGERY     TEE WITHOUT CARDIOVERSION N/A 06/01/2022   Procedure: TRANSESOPHAGEAL ECHOCARDIOGRAM (TEE);  Surgeon: Little Ishikawa, MD;  Location: [facility] ENDOSCOPY;  Service: Cardiovascular;  Laterality: N/A;    Current Outpatient Medications  Medication Sig Dispense Refill   acetaminophen (TYLENOL) 325 MG tablet Take 2 tablets (650 mg total) by mouth every 6 (six) hours as needed for mild pain, fever or headache.     Calcium Carbonate Antacid (TUMS PO) Take 4 tablets by mouth as needed (discomfort).     colchicine 0.6 MG tablet Take 1 tablet (0.6 mg total) by mouth 2 (two) times daily. 180 tablet 0   ELIQUIS 5 MG TABS tablet TAKE 1 TABLET BY MOUTH 2 TIMES A DAY 180 tablet 1   gabapentin (NEURONTIN) 100 MG capsule Take 100 mg by mouth See admin instructions. Take with 300 mg for a total of 400 mg at bedtime     gabapentin (NEURONTIN) 300 MG capsule Take 300 mg by mouth See admin instructions. Take with 100 mg for a total of 400 mg at bedtime     ibuprofen (ADVIL) 200 MG tablet Take 1 tablet (200 mg total) by mouth every 8 (eight) hours as needed for moderate pain.     nitroGLYCERIN (NITROSTAT) 0.4 MG SL tablet Place 0.4 mg under the tongue every 5 (five) minutes as needed for chest pain.     pantoprazole  (PROTONIX) 40 MG tablet Take 1 tablet (40 mg total) by mouth daily. 60 tablet 0   predniSONE (DELTASONE) 20 MG tablet Take 1 tablet a day for 14 days, then 1/2 tablet a day for 14 days, then stop 21 tablet 0   No current facility-administered medications for this visit.    Allergies:   Ciprofloxacin, Tramadol, Duloxetine, Hydrocodone-acetaminophen, Nisoldipine, Benadryl [diphenhydramine], Methocarbamol, Ondansetron hcl, and Sulfa antibiotics   Social History:  The patient  reports that he quit smoking about 26 years ago. His smoking use included cigarettes. He has never used smokeless tobacco. He reports that he does not drink alcohol and does not use drugs.   Family History:  The patient's family history includes Hypertension in his father.  ROS:  Please see the history of present illness.    All other systems are reviewed and otherwise negative.   PHYSICAL EXAM:  VS:  There were no vitals taken for this visit. BMI: There is no height or weight on file to calculate BMI. Well  nourished, well developed, in no acute distress HEENT: normocephalic, atraumatic Neck: no JVD, carotid bruits or masses Cardiac:   RRR; some extrasystoles noted, no significant murmurs, no rubs, or gallops Lungs: CTA b/l, no wheezing, rhonchi or rales Abd: soft, nontender MS: no deformity, age appropriate atrophy Ext: no edema Skin: warm and dry, no rash Neuro:  No gross deficits appreciated Psych: euthymic mood, full affect  PPM site is stable, no tethering or discomfort   EKG:  done today and reviewed by myself SR 74bpm, PACs, icRBBB  Device interrogation done today and reviewed by myself:  Battery and lead measurements are good Very brief PATs/AFlutter, longest 12 seconds, burden is <1% No HVR episodes AP 3.4% VP <1%  07/12/23: TTE (limited) 1. Trivial pericardial effusion.   2. Left ventricular ejection fraction, by estimation, is 55 to 60%. The  left ventricle has normal function. The left  ventricular internal cavity  size was mildly dilated.   3. There is no evidence of cardiac tamponade.   4. Mild mitral valve regurgitation.   5. Tricuspid valve regurgitation is moderate to severe.   6. There is mildly elevated pulmonary artery systolic pressure. The  estimated right ventricular systolic pressure is 44.0 mmHg.   7. The inferior vena cava is dilated in size with <50% respiratory  variability, suggesting right atrial pressure of 15 mmHg.    06/29/23: EPS/ablation CONCLUSIONS: 1.  Sinus rhythm upon presentation.   2. Successful electrical isolation and anatomical encircling of all four pulmonary veins with radiofrequency current.  A WACA approach was used 3. Additional left atrial ablation was performed with a standard box lesion created along the posterior wall of the left atrium 4.  Atrial flutter induced with catheter movement, ablation along the anterior wall of the left atrium 5.  Cardioversion from atrial flutter to sinus rhythm  6.  No early apparent complications.  06/01/22: TEE 1. Left ventricular ejection fraction, by estimation, is 50 to 55%. The  left ventricle has low normal function.   2. Right ventricular systolic function is normal. The right ventricular  size is normal.   3. No left atrial/left atrial appendage thrombus was detected.   4. Right atrial size was mildly dilated.   5. The mitral valve is normal in structure. Trivial mitral valve  regurgitation.   6. Tricuspid valve regurgitation is mild to moderate.   7. The aortic valve is tricuspid. Aortic valve regurgitation is not  visualized.   Conclusion(s)/Recommendation(s): No LA/LAA thrombus identified. Successful  cardioversion performed.    10/29/21; EPS/ablation CONCLUSIONS: 1. Atrial fibrillation upon presentation.   2. Successful electrical isolation and anatomical encircling of all four pulmonary veins with radiofrequency current.  A WACA approach was used 3. Additional left atrial  ablation was performed with a standard box lesion created along the posterior wall of the left atrium 4. No early apparent complications.   Recent Labs: 07/11/2023: ALT 13 07/12/2023: Hemoglobin 8.0; Magnesium 1.8; Platelets 244 07/13/2023: BUN 17; Creatinine, Ser 1.26; Potassium 4.7; Sodium 137  No results found for requested labs within last 365 days.   CrCl cannot be calculated (Patient's most recent lab result is older than the maximum 21 days allowed.).   Wt Readings from Last 3 Encounters:  07/27/23 200 lb (90.7 kg)  07/10/23 198 lb 6.6 oz (90 kg)  07/02/23 198 lb 12.8 oz (90.2 kg)     Other studies reviewed: Additional studies/records reviewed today include: summarized above  ASSESSMENT AND PLAN:  Persistent AFib CHA2DS2Vasc is 2,  on eliquis, appropriately dosed <1 % burden (PATs/AFlutter)   PPM Intact function No programming changes made  PVCs None noted today during his interrogation none on his EKG  4. Secondary hypercoaguable state  5. Anemia Has seen heme-onc since his ablation/labs in Epic Labs today   Disposition: back in 4 mo otherwise, sooner if needed   Current medicines are reviewed at length with the patient today.  The patient did not have any concerns regarding medicines.  Norma Fredrickson, PA-C 11/05/2023 9:39 AM     [facility] [address] [place] [address] [phone] (office)  [phone] (fax)

## 2023-11-07 ENCOUNTER — Ambulatory Visit: Payer: PPO | Attending: Physician Assistant | Admitting: Physician Assistant

## 2023-11-07 ENCOUNTER — Encounter: Payer: Self-pay | Admitting: Physician Assistant

## 2023-11-07 VITALS — BP 116/60 | HR 74 | Ht 75.0 in | Wt 201.2 lb

## 2023-11-07 DIAGNOSIS — I4819 Other persistent atrial fibrillation: Secondary | ICD-10-CM

## 2023-11-07 DIAGNOSIS — I10 Essential (primary) hypertension: Secondary | ICD-10-CM | POA: Diagnosis not present

## 2023-11-07 DIAGNOSIS — Z950 Presence of cardiac pacemaker: Secondary | ICD-10-CM

## 2023-11-07 DIAGNOSIS — D6869 Other thrombophilia: Secondary | ICD-10-CM | POA: Diagnosis not present

## 2023-11-07 LAB — CUP PACEART INCLINIC DEVICE CHECK
Battery Remaining Longevity: 102 mo
Battery Voltage: 3.01 V
Brady Statistic RA Percent Paced: 3.4 %
Brady Statistic RV Percent Paced: 0.09 %
Date Time Interrogation Session: 20241126093221
Implantable Lead Connection Status: 753985
Implantable Lead Connection Status: 753985
Implantable Lead Implant Date: 20211011
Implantable Lead Implant Date: 20211011
Implantable Lead Location: 753859
Implantable Lead Location: 753860
Implantable Pulse Generator Implant Date: 20211011
Lead Channel Impedance Value: 375 Ohm
Lead Channel Impedance Value: 450 Ohm
Lead Channel Pacing Threshold Amplitude: 0.625 V
Lead Channel Pacing Threshold Amplitude: 1 V
Lead Channel Pacing Threshold Pulse Width: 0.5 ms
Lead Channel Pacing Threshold Pulse Width: 0.5 ms
Lead Channel Sensing Intrinsic Amplitude: 3.1 mV
Lead Channel Sensing Intrinsic Amplitude: 8.5 mV
Lead Channel Setting Pacing Amplitude: 0.875
Lead Channel Setting Pacing Amplitude: 2 V
Lead Channel Setting Pacing Pulse Width: 0.5 ms
Lead Channel Setting Sensing Sensitivity: 2 mV
Pulse Gen Model: 2272
Pulse Gen Serial Number: 3869163

## 2023-11-07 NOTE — Patient Instructions (Signed)
Medication Instructions:  No changes  *If you need a refill on your cardiac medications before your next appointment, please call your pharmacy*   Lab Work: Today: cbc, bmet If you have labs (blood work) drawn today and your tests are completely normal, you will receive your results only by: MyChart Message (if you have MyChart) OR A paper copy in the mail If you have any lab test that is abnormal or we need to change your treatment, we will call you to review the results.   Testing/Procedures: none   Follow-Up: At [facility], you and your health needs are our priority.  As part of our continuing mission to provide you with exceptional heart care, we have created designated Provider Care Teams.  These Care Teams include your primary Cardiologist (physician) and Advanced Practice Providers (APPs -  Physician Assistants and Nurse Practitioners) who all work together to provide you with the care you need, when you need it.  We recommend signing up for the patient portal called "MyChart".  Sign up information is provided on this After Visit Summary.  MyChart is used to connect with patients for Virtual Visits (Telemedicine).  Patients are able to view lab/test results, encounter notes, upcoming appointments, etc.  Non-urgent messages can be sent to your provider as well.   To learn more about what you can do with MyChart, go to [email or web address].    Your next appointment:   4 month(s)  Provider:   You will see one of the following Advanced Practice Providers on your designated Care Team:   Francis Dowse, [place]

## 2023-11-13 ENCOUNTER — Telehealth: Payer: Self-pay | Admitting: *Deleted

## 2023-11-13 NOTE — Telephone Encounter (Signed)
Spoke with patient  who is aware of not getting lab work completed, due to being told he could  to go to [facility] location.Patient stated will get this done this week. Patient was told to contact office if any problem with getting lab work done.

## 2023-11-15 LAB — BASIC METABOLIC PANEL
BUN/Creatinine Ratio: 11 (ref 10–24)
BUN: 14 mg/dL (ref 8–27)
CO2: 23 mmol/L (ref 20–29)
Calcium: 9.1 mg/dL (ref 8.6–10.2)
Chloride: 106 mmol/L (ref 96–106)
Creatinine, Ser: 1.26 mg/dL (ref 0.76–1.27)
Glucose: 90 mg/dL (ref 70–99)
Potassium: 5.6 mmol/L — ABNORMAL HIGH (ref 3.5–5.2)
Sodium: 142 mmol/L (ref 134–144)
eGFR: 56 mL/min/{1.73_m2} — ABNORMAL LOW (ref >59)

## 2023-11-15 LAB — CBC
Hematocrit: 30.7 % — ABNORMAL LOW (ref 37.5–51.0)
Hemoglobin: 10 g/dL — ABNORMAL LOW (ref 13.0–17.7)
MCH: 32.6 pg (ref 26.6–33.0)
MCHC: 32.6 g/dL (ref 31.5–35.7)
MCV: 100 fL — ABNORMAL HIGH (ref 79–97)
Platelets: 323 10*3/uL (ref 150–450)
RBC: 3.07 x10E6/uL — ABNORMAL LOW (ref 4.14–5.80)
RDW: 23.7 % — ABNORMAL HIGH (ref 11.6–15.4)
WBC: 4.6 10*3/uL (ref 3.4–10.8)

## 2023-11-16 ENCOUNTER — Other Ambulatory Visit: Payer: Self-pay | Admitting: *Deleted

## 2023-11-16 DIAGNOSIS — E875 Hyperkalemia: Secondary | ICD-10-CM

## 2023-11-18 LAB — BASIC METABOLIC PANEL
BUN/Creatinine Ratio: 10 (ref 10–24)
BUN: 14 mg/dL (ref 8–27)
CO2: 19 mmol/L — ABNORMAL LOW (ref 20–29)
Calcium: 8.5 mg/dL — ABNORMAL LOW (ref 8.6–10.2)
Chloride: 105 mmol/L (ref 96–106)
Creatinine, Ser: 1.36 mg/dL — ABNORMAL HIGH (ref 0.76–1.27)
Glucose: 101 mg/dL — ABNORMAL HIGH (ref 70–99)
Potassium: 5.1 mmol/L (ref 3.5–5.2)
Sodium: 140 mmol/L (ref 134–144)
eGFR: 51 mL/min/{1.73_m2} — ABNORMAL LOW (ref >59)

## 2023-12-07 ENCOUNTER — Ambulatory Visit (INDEPENDENT_AMBULATORY_CARE_PROVIDER_SITE_OTHER): Payer: PPO

## 2023-12-07 DIAGNOSIS — I495 Sick sinus syndrome: Secondary | ICD-10-CM | POA: Diagnosis not present

## 2023-12-09 LAB — CUP PACEART REMOTE DEVICE CHECK
Battery Remaining Longevity: 91 mo
Battery Remaining Percentage: 72 %
Battery Voltage: 3.01 V
Brady Statistic AP VP Percent: 1 %
Brady Statistic AP VS Percent: 6.1 %
Brady Statistic AS VP Percent: 1 %
Brady Statistic AS VS Percent: 94 %
Brady Statistic RA Percent Paced: 5.4 %
Brady Statistic RV Percent Paced: 1 %
Date Time Interrogation Session: 20241226040023
Implantable Lead Connection Status: 753985
Implantable Lead Connection Status: 753985
Implantable Lead Implant Date: 20211011
Implantable Lead Implant Date: 20211011
Implantable Lead Location: 753859
Implantable Lead Location: 753860
Implantable Pulse Generator Implant Date: 20211011
Lead Channel Impedance Value: 360 Ohm
Lead Channel Impedance Value: 430 Ohm
Lead Channel Pacing Threshold Amplitude: 0.625 V
Lead Channel Pacing Threshold Amplitude: 1.125 V
Lead Channel Pacing Threshold Pulse Width: 0.5 ms
Lead Channel Pacing Threshold Pulse Width: 0.5 ms
Lead Channel Sensing Intrinsic Amplitude: 1.7 mV
Lead Channel Sensing Intrinsic Amplitude: 7.8 mV
Lead Channel Setting Pacing Amplitude: 0.875
Lead Channel Setting Pacing Amplitude: 2.125
Lead Channel Setting Pacing Pulse Width: 0.5 ms
Lead Channel Setting Sensing Sensitivity: 2 mV
Pulse Gen Model: 2272
Pulse Gen Serial Number: 3869163

## 2023-12-27 DIAGNOSIS — L02413 Cutaneous abscess of right upper limb: Secondary | ICD-10-CM | POA: Diagnosis not present

## 2023-12-27 DIAGNOSIS — L570 Actinic keratosis: Secondary | ICD-10-CM | POA: Diagnosis not present

## 2023-12-27 DIAGNOSIS — L728 Other follicular cysts of the skin and subcutaneous tissue: Secondary | ICD-10-CM | POA: Diagnosis not present

## 2023-12-27 DIAGNOSIS — C44629 Squamous cell carcinoma of skin of left upper limb, including shoulder: Secondary | ICD-10-CM | POA: Diagnosis not present

## 2023-12-29 DIAGNOSIS — H608X3 Other otitis externa, bilateral: Secondary | ICD-10-CM | POA: Diagnosis not present

## 2024-01-12 DIAGNOSIS — H608X3 Other otitis externa, bilateral: Secondary | ICD-10-CM | POA: Diagnosis not present

## 2024-01-17 DIAGNOSIS — H43813 Vitreous degeneration, bilateral: Secondary | ICD-10-CM | POA: Diagnosis not present

## 2024-01-17 DIAGNOSIS — H34831 Tributary (branch) retinal vein occlusion, right eye, with macular edema: Secondary | ICD-10-CM | POA: Diagnosis not present

## 2024-01-17 DIAGNOSIS — H35373 Puckering of macula, bilateral: Secondary | ICD-10-CM | POA: Diagnosis not present

## 2024-01-17 DIAGNOSIS — H26492 Other secondary cataract, left eye: Secondary | ICD-10-CM | POA: Diagnosis not present

## 2024-01-17 DIAGNOSIS — H3581 Retinal edema: Secondary | ICD-10-CM | POA: Diagnosis not present

## 2024-01-26 DIAGNOSIS — R42 Dizziness and giddiness: Secondary | ICD-10-CM | POA: Diagnosis not present

## 2024-01-26 DIAGNOSIS — R10826 Epigastric rebound abdominal tenderness: Secondary | ICD-10-CM | POA: Diagnosis not present

## 2024-01-26 DIAGNOSIS — I6782 Cerebral ischemia: Secondary | ICD-10-CM | POA: Diagnosis not present

## 2024-01-26 DIAGNOSIS — R200 Anesthesia of skin: Secondary | ICD-10-CM | POA: Diagnosis not present

## 2024-02-02 DIAGNOSIS — M792 Neuralgia and neuritis, unspecified: Secondary | ICD-10-CM | POA: Diagnosis not present

## 2024-02-02 DIAGNOSIS — D708 Other neutropenia: Secondary | ICD-10-CM | POA: Diagnosis not present

## 2024-02-02 DIAGNOSIS — C61 Malignant neoplasm of prostate: Secondary | ICD-10-CM | POA: Diagnosis not present

## 2024-02-02 DIAGNOSIS — D539 Nutritional anemia, unspecified: Secondary | ICD-10-CM | POA: Diagnosis not present

## 2024-02-14 NOTE — Progress Notes (Signed)
 Cardiology Office Note Date:  02/14/2024  Patient ID:  Todd Salazar, DOB 06/24/1936, MRN [id number] PCP:  Patient, No Pcp Per  Cardiologist:  Dr. Rudolpho Sevin Electrophysiologist: Dr. Elberta Fortis     Chief Complaint:    4 mo  History of Present Illness: Todd Salazar is a 88 y.o. male with history of of persistent Afib, Atach, bronchiectasis/restrictive lung disease, symptomatic bradycardia with PPM, orthostatic hypotension, prostate cancer, CKD (III), PVCs  -----  I have seen him over the years Of late:  Saw Dr Elberta Fortis 03/20/23, had a hospitalization at [facility] for AFib, felt poorly with it, though low burden 1.7% Amiodarone started and planned for re-do ablation with efforts to avoid amiodarone given his restrictive lung disease  PVI ablation 06/29/23 > amiodarone stopped post procedure   I saw him 11/07/23 He is accompanied by his daughter in law, he is doing well, does not think he has had any Afib Has a terrible R knee, got gel injection last week, hoping for some relief soon.  Tweaked his back yesterday, orthopedic issues are his primary limiter. Was admitted for PNA post ablation a few days, otherwise no post ablation concerns, groin sites healed well, no trouble swallowing Has an occasional transient heaviness in his low chest when he first gets into bed (for quite a while, pre-date ablation), none with exertion. No unusual SOB No near syncope or syncope. No bleeding or signs of bleeding No PVCs on exam, during device interrogation No changes were made  TODAY  He is accompanied by [name] with peripheral neuropathy feet pain especially at night And difficulty staying asleep Otherwise No CP, palpitations, SOB No near syncope or syncope No bleeding, signs of bleeding   Device information Abbott dual chamber PPM implanted 09/21/2020  AAD Hx failed sotalol, flecainide, and propafenone, and apparently intolerant of nodal blocking agents Tikosyn Sept 2022 >>failed  to maintain SR stopped Sept 2022 PVI ablation 10/29/2021 (tx with colchicine post procedure for pericarditis) Amiodarone started Sept 2022 >> stopped post PVI ablation Amiodarone restarted 03/20/23 >> stopped July post ablation PVI ablation 06/29/23   Known to have some degree of PVCs (11% 2023)  Past Medical History:  Diagnosis Date   Arthritis    Atrial fibrillation (HCC)    Cancer (HCC)    prostate   Dysrhythmia    afib   History of kidney stones     Past Surgical History:  Procedure Laterality Date   ATRIAL FIBRILLATION ABLATION N/A 10/29/2021   Procedure: ATRIAL FIBRILLATION ABLATION;  Surgeon: Regan Lemming, MD;  [facility]: [facility] INVAS[facility] LAB;  Service: Cardiovascular;  Laterality: N/A;   ATRIAL FIBRILLATION ABLATION N/A 06/29/2023   Procedure: ATRIAL FIBRILLATION ABLATION;  Surgeon: Regan Lemming, MD;  Location: [facility] INVASIVE CV LAB;  Service: Cardiovascular;  Laterality: N/A;   Bone spur     CARDIOVERSION N/A 06/01/2022   Procedure: CARDIOVERSION;  Surgeon: Little Ishikawa, MD;  Location: [facility] ENDOSCOPY;  Service: Cardiovascular;  Laterality: N/A;   CHOLECYSTECTOMY     HERNIA REPAIR     JOINT REPLACEMENT     kidney stones     LAMINECTOMY WITH POSTERIOR LATERAL ARTHRODESIS LEVEL 1 Right 11/25/2019   Procedure: Laminectomy and Foraminotomy  Lumbar Four-Lumbar Five - right, with instrumented fusion;  Surgeon: Tia Alert, MD;  Location: [facility] OR;  Service: Neurosurgery;  Laterality: Right;  Laminectomy and Foraminotomy  Lumbar Four-Lumbar Five - right, with instrumented fusion   PROSTATE SURGERY     TEE  WITHOUT CARDIOVERSION N/A 06/01/2022   Procedure: TRANSESOPHAGEAL ECHOCARDIOGRAM (TEE);  Surgeon: Little Ishikawa, MD;  Location: [facility] ENDOSCOPY;  Service: Cardiovascular;  Laterality: N/A;    Current Outpatient Medications  Medication Sig Dispense Refill   ELIQUIS 5 MG TABS tablet TAKE 1 TABLET BY MOUTH 2 TIMES A DAY 180 tablet 1   nitroGLYCERIN  (NITROSTAT) 0.4 MG SL tablet Place 0.4 mg under the tongue every 5 (five) minutes as needed for chest pain.     No current facility-administered medications for this visit.    Allergies:   Ciprofloxacin, Tramadol, Duloxetine, Hydrocodone-acetaminophen, Nisoldipine, Benadryl [diphenhydramine], Methocarbamol, Ondansetron hcl, and Sulfa antibiotics   Social History:  The patient  reports that he quit smoking about 27 years ago. His smoking use included cigarettes. He has never used smokeless tobacco. He reports that he does not drink alcohol and does not use drugs.   Family History:  The patient's family history includes Hypertension in his father.  ROS:  Please see the history of present illness.    All other systems are reviewed and otherwise negative.   PHYSICAL EXAM:  VS:  There were no vitals taken for this visit. BMI: There is no height or weight on file to calculate BMI. Well nourished, well developed, in no acute distress HEENT: normocephalic, atraumatic Neck: no JVD, carotid bruits or masses Cardiac: RRR; no significant murmurs, no rubs, or gallops Lungs: CTA b/l, no wheezing, rhonchi or rales Abd: soft, nontender MS: no deformity, age appropriate atrophy Ext: no edema Skin: warm and dry, no rash Neuro:  No gross deficits appreciated Psych: euthymic mood, full affect  PPM site is stable, no tethering or discomfort   EKG: not done today  Device interrogation done today and reviewed by myself:  Battery and lead measurements are good Very brief PATs, on AFlutter 4 seconds <1% burden AP 6.3% VP <1% A lead cap confirm unable to run >> changed to monitor, fixed output to 2.0V (threshold 0.75/0.5)  07/12/23: TTE (limited) 1. Trivial pericardial effusion.   2. Left ventricular ejection fraction, by estimation, is 55 to 60%. The  left ventricle has normal function. The left ventricular internal cavity  size was mildly dilated.   3. There is no evidence of cardiac tamponade.    4. Mild mitral valve regurgitation.   5. Tricuspid valve regurgitation is moderate to severe.   6. There is mildly elevated pulmonary artery systolic pressure. The  estimated right ventricular systolic pressure is 44.0 mmHg.   7. The inferior vena cava is dilated in size with <50% respiratory  variability, suggesting right atrial pressure of 15 mmHg.    06/29/23: EPS/ablation CONCLUSIONS: 1.  Sinus rhythm upon presentation.   2. Successful electrical isolation and anatomical encircling of all four pulmonary veins with radiofrequency current.  A WACA approach was used 3. Additional left atrial ablation was performed with a standard box lesion created along the posterior wall of the left atrium 4.  Atrial flutter induced with catheter movement, ablation along the anterior wall of the left atrium 5.  Cardioversion from atrial flutter to sinus rhythm  6.  No early apparent complications.  06/01/22: TEE 1. Left ventricular ejection fraction, by estimation, is 50 to 55%. The  left ventricle has low normal function.   2. Right ventricular systolic function is normal. The right ventricular  size is normal.   3. No left atrial/left atrial appendage thrombus was detected.   4. Right atrial size was mildly dilated.   5. The mitral  valve is normal in structure. Trivial mitral valve  regurgitation.   6. Tricuspid valve regurgitation is mild to moderate.   7. The aortic valve is tricuspid. Aortic valve regurgitation is not  visualized.   Conclusion(s)/Recommendation(s): No LA/LAA thrombus identified. Successful  cardioversion performed.    10/29/21; EPS/ablation CONCLUSIONS: 1. Atrial fibrillation upon presentation.   2. Successful electrical isolation and anatomical encircling of all four pulmonary veins with radiofrequency current.  A WACA approach was used 3. Additional left atrial ablation was performed with a standard box lesion created along the posterior wall of the left atrium 4. No  early apparent complications.   Recent Labs: 07/11/2023: ALT 13 07/12/2023: Magnesium 1.8 11/14/2023: Hemoglobin 10.0; Platelets 323 11/17/2023: BUN 14; Creatinine, Ser 1.36; Potassium 5.1; Sodium 140  No results found for requested labs within last 365 days.   CrCl cannot be calculated (Patient's most recent lab result is older than the maximum 21 days allowed.).   Wt Readings from Last 3 Encounters:  11/07/23 201 lb 3.2 oz (91.3 kg)  07/27/23 200 lb (90.7 kg)  07/10/23 198 lb 6.6 oz (90 kg)     Other studies reviewed: Additional studies/records reviewed today include: summarized above  ASSESSMENT AND PLAN:  Persistent AFib CHA2DS2Vasc is 2, on eliquis, appropriately dosed <1 % burden (PATs/AFlutter)   PPM Intact function Programmed as above  PVCs None noted today during his interrogation or exam  4. Secondary hypercoaguable state   Disposition: back in 6 mo otherwise, sooner if needed     Current medicines are reviewed at length with the patient today.  The patient did not have any concerns regarding medicines.  Todd Fredrickson, PA-C 02/14/2024 3:04 PM     [facility] [address] [place] [address] [phone] (office)  [phone] (fax)

## 2024-02-19 ENCOUNTER — Encounter: Payer: Self-pay | Admitting: Physician Assistant

## 2024-02-19 ENCOUNTER — Ambulatory Visit: Payer: PPO | Attending: Physician Assistant | Admitting: Physician Assistant

## 2024-02-19 VITALS — BP 126/60 | HR 72 | Ht 75.0 in | Wt 202.0 lb

## 2024-02-19 DIAGNOSIS — I493 Ventricular premature depolarization: Secondary | ICD-10-CM

## 2024-02-19 DIAGNOSIS — D6869 Other thrombophilia: Secondary | ICD-10-CM

## 2024-02-19 DIAGNOSIS — Z950 Presence of cardiac pacemaker: Secondary | ICD-10-CM | POA: Diagnosis not present

## 2024-02-19 DIAGNOSIS — I4819 Other persistent atrial fibrillation: Secondary | ICD-10-CM

## 2024-02-19 MED ORDER — APIXABAN 5 MG PO TABS: 5.0000 mg | ORAL_TABLET | Freq: Two times a day (BID) | ORAL | 1 refills | Status: DC

## 2024-02-19 NOTE — Patient Instructions (Signed)
 Medication Instructions:   Your physician recommends that you continue on your current medications as directed. Please refer to the Current Medication list given to you today.   *If you need a refill on your cardiac medications before your next appointment, please call your pharmacy*   Lab Work:  NONE ORDERED  TODAY    If you have labs (blood work) drawn today and your tests are completely normal, you will receive your results only by: MyChart Message (if you have MyChart) OR A paper copy in the mail If you have any lab test that is abnormal or we need to change your treatment, we will call you to review the results.   Testing/Procedures: NONE ORDERED  TODAY      Follow-Up: At [facility], you and your health needs are our priority.  As part of our continuing mission to provide you with exceptional heart care, we have created designated Provider Care Teams.  These Care Teams include your primary Cardiologist (physician) and Advanced Practice Providers (APPs -  Physician Assistants and Nurse Practitioners) who all work together to provide you with the care you need, when you need it.  We recommend signing up for the patient portal called "MyChart".  Sign up information is provided on this After Visit Summary.  MyChart is used to connect with patients for Virtual Visits (Telemedicine).  Patients are able to view lab/test results, encounter notes, upcoming appointments, etc.  Non-urgent messages can be sent to your provider as well.   To learn more about what you can do with MyChart, go to [email or web address].    Your next appointment:    6 month(s)  Provider:    You may see   Francis Dowse, PA-C    Other Instructions    1st Floor: - Lobby - Registration  - Pharmacy  - Lab - Cafe  2nd Floor: - PV Lab - Diagnostic Testing (echo, CT, nuclear med)  3rd Floor: - Vacant  4th Floor: - TCTS (cardiothoracic surgery) - AFib Clinic - Structural Heart  Clinic - Vascular Surgery  - Vascular Ultrasound  5th Floor: - HeartCare Cardiology (general and EP) - Clinical Pharmacy for coumadin, hypertension, lipid, weight-loss medications, and med management appointments    Valet parking services will be available as well.

## 2024-03-07 ENCOUNTER — Ambulatory Visit (INDEPENDENT_AMBULATORY_CARE_PROVIDER_SITE_OTHER): Payer: PPO

## 2024-03-07 DIAGNOSIS — I2510 Atherosclerotic heart disease of native coronary artery without angina pectoris: Secondary | ICD-10-CM | POA: Diagnosis not present

## 2024-03-07 DIAGNOSIS — I480 Paroxysmal atrial fibrillation: Secondary | ICD-10-CM

## 2024-03-07 LAB — CUP PACEART REMOTE DEVICE CHECK
Battery Remaining Longevity: 89 mo
Battery Remaining Percentage: 70 %
Battery Voltage: 3.01 V
Brady Statistic AP VP Percent: 1 %
Brady Statistic AP VS Percent: 5.8 %
Brady Statistic AS VP Percent: 1 %
Brady Statistic AS VS Percent: 94 %
Brady Statistic RA Percent Paced: 5.2 %
Brady Statistic RV Percent Paced: 1 %
Date Time Interrogation Session: 20250327040016
Implantable Lead Connection Status: 753985
Implantable Lead Connection Status: 753985
Implantable Lead Implant Date: 20211011
Implantable Lead Implant Date: 20211011
Implantable Lead Location: 753859
Implantable Lead Location: 753860
Implantable Pulse Generator Implant Date: 20211011
Lead Channel Impedance Value: 360 Ohm
Lead Channel Impedance Value: 430 Ohm
Lead Channel Pacing Threshold Amplitude: 0.5 V
Lead Channel Pacing Threshold Amplitude: 0.875 V
Lead Channel Pacing Threshold Pulse Width: 0.5 ms
Lead Channel Pacing Threshold Pulse Width: 0.5 ms
Lead Channel Sensing Intrinsic Amplitude: 1.8 mV
Lead Channel Sensing Intrinsic Amplitude: 8.1 mV
Lead Channel Setting Pacing Amplitude: 0.75 V
Lead Channel Setting Pacing Amplitude: 2 V
Lead Channel Setting Pacing Pulse Width: 0.5 ms
Lead Channel Setting Sensing Sensitivity: 2 mV
Pulse Gen Model: 2272
Pulse Gen Serial Number: 3869163

## 2024-03-13 DIAGNOSIS — I2510 Atherosclerotic heart disease of native coronary artery without angina pectoris: Secondary | ICD-10-CM | POA: Diagnosis not present

## 2024-04-04 DIAGNOSIS — D509 Iron deficiency anemia, unspecified: Secondary | ICD-10-CM | POA: Diagnosis not present

## 2024-04-04 DIAGNOSIS — I959 Hypotension, unspecified: Secondary | ICD-10-CM | POA: Diagnosis not present

## 2024-04-04 DIAGNOSIS — N281 Cyst of kidney, acquired: Secondary | ICD-10-CM | POA: Diagnosis not present

## 2024-04-05 NOTE — Telephone Encounter (Signed)
 Lab reqs has been discarded since patient hasn't picked it from our office.

## 2024-04-08 DIAGNOSIS — J984 Other disorders of lung: Secondary | ICD-10-CM | POA: Diagnosis not present

## 2024-04-08 DIAGNOSIS — D649 Anemia, unspecified: Secondary | ICD-10-CM | POA: Diagnosis not present

## 2024-04-08 DIAGNOSIS — G629 Polyneuropathy, unspecified: Secondary | ICD-10-CM | POA: Diagnosis not present

## 2024-04-08 DIAGNOSIS — J479 Bronchiectasis, uncomplicated: Secondary | ICD-10-CM | POA: Diagnosis not present

## 2024-04-08 DIAGNOSIS — M549 Dorsalgia, unspecified: Secondary | ICD-10-CM | POA: Diagnosis not present

## 2024-04-08 DIAGNOSIS — Z0000 Encounter for general adult medical examination without abnormal findings: Secondary | ICD-10-CM | POA: Diagnosis not present

## 2024-04-08 DIAGNOSIS — G2581 Restless legs syndrome: Secondary | ICD-10-CM | POA: Diagnosis not present

## 2024-04-08 DIAGNOSIS — I4819 Other persistent atrial fibrillation: Secondary | ICD-10-CM | POA: Diagnosis not present

## 2024-04-08 DIAGNOSIS — M461 Sacroiliitis, not elsewhere classified: Secondary | ICD-10-CM | POA: Diagnosis not present

## 2024-04-08 DIAGNOSIS — C61 Malignant neoplasm of prostate: Secondary | ICD-10-CM | POA: Diagnosis not present

## 2024-04-08 DIAGNOSIS — R0609 Other forms of dyspnea: Secondary | ICD-10-CM | POA: Diagnosis not present

## 2024-04-08 DIAGNOSIS — E7800 Pure hypercholesterolemia, unspecified: Secondary | ICD-10-CM | POA: Diagnosis not present

## 2024-04-08 DIAGNOSIS — G8929 Other chronic pain: Secondary | ICD-10-CM | POA: Diagnosis not present

## 2024-04-17 NOTE — Progress Notes (Signed)
 Remote pacemaker transmission.

## 2024-04-22 DIAGNOSIS — C44519 Basal cell carcinoma of skin of other part of trunk: Secondary | ICD-10-CM | POA: Diagnosis not present

## 2024-04-22 DIAGNOSIS — L570 Actinic keratosis: Secondary | ICD-10-CM | POA: Diagnosis not present

## 2024-04-22 DIAGNOSIS — L821 Other seborrheic keratosis: Secondary | ICD-10-CM | POA: Diagnosis not present

## 2024-04-24 DIAGNOSIS — H34831 Tributary (branch) retinal vein occlusion, right eye, with macular edema: Secondary | ICD-10-CM | POA: Diagnosis not present

## 2024-04-24 DIAGNOSIS — H3581 Retinal edema: Secondary | ICD-10-CM | POA: Diagnosis not present

## 2024-04-24 DIAGNOSIS — H43813 Vitreous degeneration, bilateral: Secondary | ICD-10-CM | POA: Diagnosis not present

## 2024-04-24 DIAGNOSIS — H26492 Other secondary cataract, left eye: Secondary | ICD-10-CM | POA: Diagnosis not present

## 2024-04-24 DIAGNOSIS — H35373 Puckering of macula, bilateral: Secondary | ICD-10-CM | POA: Diagnosis not present

## 2024-05-16 DIAGNOSIS — D539 Nutritional anemia, unspecified: Secondary | ICD-10-CM | POA: Diagnosis not present

## 2024-05-16 DIAGNOSIS — C61 Malignant neoplasm of prostate: Secondary | ICD-10-CM | POA: Diagnosis not present

## 2024-05-16 DIAGNOSIS — D708 Other neutropenia: Secondary | ICD-10-CM | POA: Diagnosis not present

## 2024-06-04 DIAGNOSIS — I5040 Unspecified combined systolic (congestive) and diastolic (congestive) heart failure: Secondary | ICD-10-CM | POA: Diagnosis not present

## 2024-06-06 ENCOUNTER — Ambulatory Visit (INDEPENDENT_AMBULATORY_CARE_PROVIDER_SITE_OTHER): Payer: PPO

## 2024-06-06 DIAGNOSIS — I495 Sick sinus syndrome: Secondary | ICD-10-CM

## 2024-06-06 LAB — CUP PACEART REMOTE DEVICE CHECK
Battery Remaining Longevity: 86 mo
Battery Remaining Percentage: 68 %
Battery Voltage: 3.01 V
Brady Statistic AP VP Percent: 1 %
Brady Statistic AP VS Percent: 12 %
Brady Statistic AS VP Percent: 1 %
Brady Statistic AS VS Percent: 87 %
Brady Statistic RA Percent Paced: 11 %
Brady Statistic RV Percent Paced: 1 %
Date Time Interrogation Session: 20250626040020
Implantable Lead Connection Status: 753985
Implantable Lead Connection Status: 753985
Implantable Lead Implant Date: 20211011
Implantable Lead Implant Date: 20211011
Implantable Lead Location: 753859
Implantable Lead Location: 753860
Implantable Pulse Generator Implant Date: 20211011
Lead Channel Impedance Value: 380 Ohm
Lead Channel Impedance Value: 450 Ohm
Lead Channel Pacing Threshold Amplitude: 0.5 V
Lead Channel Pacing Threshold Amplitude: 1.25 V
Lead Channel Pacing Threshold Pulse Width: 0.5 ms
Lead Channel Pacing Threshold Pulse Width: 0.5 ms
Lead Channel Sensing Intrinsic Amplitude: 1.9 mV
Lead Channel Sensing Intrinsic Amplitude: 8.8 mV
Lead Channel Setting Pacing Amplitude: 0.75 V
Lead Channel Setting Pacing Amplitude: 2 V
Lead Channel Setting Pacing Pulse Width: 0.5 ms
Lead Channel Setting Sensing Sensitivity: 2 mV
Pulse Gen Model: 2272
Pulse Gen Serial Number: 3869163

## 2024-06-09 ENCOUNTER — Ambulatory Visit: Payer: Self-pay | Admitting: Cardiology

## 2024-06-25 DIAGNOSIS — N50819 Testicular pain, unspecified: Secondary | ICD-10-CM | POA: Diagnosis not present

## 2024-06-26 DIAGNOSIS — M1711 Unilateral primary osteoarthritis, right knee: Secondary | ICD-10-CM | POA: Diagnosis not present

## 2024-06-26 DIAGNOSIS — M25661 Stiffness of right knee, not elsewhere classified: Secondary | ICD-10-CM | POA: Diagnosis not present

## 2024-06-26 DIAGNOSIS — M25561 Pain in right knee: Secondary | ICD-10-CM | POA: Diagnosis not present

## 2024-06-26 DIAGNOSIS — R262 Difficulty in walking, not elsewhere classified: Secondary | ICD-10-CM | POA: Diagnosis not present

## 2024-07-31 DIAGNOSIS — H34831 Tributary (branch) retinal vein occlusion, right eye, with macular edema: Secondary | ICD-10-CM | POA: Diagnosis not present

## 2024-07-31 DIAGNOSIS — H43813 Vitreous degeneration, bilateral: Secondary | ICD-10-CM | POA: Diagnosis not present

## 2024-07-31 DIAGNOSIS — H3581 Retinal edema: Secondary | ICD-10-CM | POA: Diagnosis not present

## 2024-07-31 DIAGNOSIS — H26492 Other secondary cataract, left eye: Secondary | ICD-10-CM | POA: Diagnosis not present

## 2024-07-31 DIAGNOSIS — H35373 Puckering of macula, bilateral: Secondary | ICD-10-CM | POA: Diagnosis not present

## 2024-08-24 DIAGNOSIS — D225 Melanocytic nevi of trunk: Secondary | ICD-10-CM | POA: Diagnosis not present

## 2024-08-24 DIAGNOSIS — C4442 Squamous cell carcinoma of skin of scalp and neck: Secondary | ICD-10-CM | POA: Diagnosis not present

## 2024-08-24 DIAGNOSIS — L570 Actinic keratosis: Secondary | ICD-10-CM | POA: Diagnosis not present

## 2024-08-24 DIAGNOSIS — L821 Other seborrheic keratosis: Secondary | ICD-10-CM | POA: Diagnosis not present

## 2024-09-05 ENCOUNTER — Ambulatory Visit: Payer: PPO

## 2024-09-05 DIAGNOSIS — I495 Sick sinus syndrome: Secondary | ICD-10-CM | POA: Diagnosis not present

## 2024-09-06 ENCOUNTER — Ambulatory Visit: Payer: Self-pay | Admitting: Cardiology

## 2024-09-06 LAB — CUP PACEART REMOTE DEVICE CHECK
Battery Remaining Longevity: 83 mo
Battery Remaining Percentage: 66 %
Battery Voltage: 2.99 V
Brady Statistic AP VP Percent: 1 %
Brady Statistic AP VS Percent: 15 %
Brady Statistic AS VP Percent: 1 %
Brady Statistic AS VS Percent: 85 %
Brady Statistic RA Percent Paced: 14 %
Brady Statistic RV Percent Paced: 1 %
Date Time Interrogation Session: 20250925040017
Implantable Lead Connection Status: 753985
Implantable Lead Connection Status: 753985
Implantable Lead Implant Date: 20211011
Implantable Lead Implant Date: 20211011
Implantable Lead Location: 753859
Implantable Lead Location: 753860
Implantable Pulse Generator Implant Date: 20211011
Lead Channel Impedance Value: 380 Ohm
Lead Channel Impedance Value: 450 Ohm
Lead Channel Pacing Threshold Amplitude: 0.625 V
Lead Channel Pacing Threshold Amplitude: 1.5 V
Lead Channel Pacing Threshold Pulse Width: 0.5 ms
Lead Channel Pacing Threshold Pulse Width: 0.5 ms
Lead Channel Sensing Intrinsic Amplitude: 1.3 mV
Lead Channel Sensing Intrinsic Amplitude: 8.2 mV
Lead Channel Setting Pacing Amplitude: 0.875
Lead Channel Setting Pacing Amplitude: 2 V
Lead Channel Setting Pacing Pulse Width: 0.5 ms
Lead Channel Setting Sensing Sensitivity: 2 mV
Pulse Gen Model: 2272
Pulse Gen Serial Number: 3869163

## 2024-09-06 NOTE — Progress Notes (Signed)
 Remote PPM Transmission

## 2024-09-09 NOTE — Progress Notes (Signed)
 Remote PPM Transmission

## 2024-09-12 DIAGNOSIS — D649 Anemia, unspecified: Secondary | ICD-10-CM | POA: Diagnosis not present

## 2024-09-12 DIAGNOSIS — Z8546 Personal history of malignant neoplasm of prostate: Secondary | ICD-10-CM | POA: Diagnosis not present

## 2024-09-12 DIAGNOSIS — E7800 Pure hypercholesterolemia, unspecified: Secondary | ICD-10-CM | POA: Diagnosis not present

## 2024-09-12 DIAGNOSIS — G8929 Other chronic pain: Secondary | ICD-10-CM | POA: Diagnosis not present

## 2024-09-12 DIAGNOSIS — J479 Bronchiectasis, uncomplicated: Secondary | ICD-10-CM | POA: Diagnosis not present

## 2024-09-12 DIAGNOSIS — M1711 Unilateral primary osteoarthritis, right knee: Secondary | ICD-10-CM | POA: Diagnosis not present

## 2024-09-12 DIAGNOSIS — M25461 Effusion, right knee: Secondary | ICD-10-CM | POA: Diagnosis not present

## 2024-09-12 DIAGNOSIS — G2581 Restless legs syndrome: Secondary | ICD-10-CM | POA: Diagnosis not present

## 2024-09-12 DIAGNOSIS — I4819 Other persistent atrial fibrillation: Secondary | ICD-10-CM | POA: Diagnosis not present

## 2024-09-12 DIAGNOSIS — G629 Polyneuropathy, unspecified: Secondary | ICD-10-CM | POA: Diagnosis not present

## 2024-09-12 DIAGNOSIS — Z7901 Long term (current) use of anticoagulants: Secondary | ICD-10-CM | POA: Diagnosis not present

## 2024-09-15 NOTE — Progress Notes (Unsigned)
 Cardiology Office Note Date:  09/15/2024  Patient ID:  Todd Salazar, DOB 09/26/1936, MRN [id number] PCP:  Patient, No Pcp Per  Cardiologist:  Dr. Meldon Electrophysiologist: Dr. Inocencio     Chief Complaint:    6 mo  History of Present Illness: Todd Salazar is a 88 y.o. male with history of  persistent Afib, Atach,  bronchiectasis/restrictive lung disease,  symptomatic bradycardia with PPM,  orthostatic hypotension, prostate cancer, CKD (III),  PVCs  -----  I have seen him over the years Of late:  Saw Dr Inocencio 03/20/23, had a hospitalization at [facility] for AFib, felt poorly with it, though low burden 1.7% Amiodarone  started and planned for re-do ablation with efforts to avoid amiodarone  given his restrictive lung disease  PVI ablation 06/29/23 > amiodarone  stopped post procedure   I saw him 11/07/23 He is accompanied by his daughter in law, he is doing well, does not think he has had any Afib Has a terrible R knee, got gel injection last week, hoping for some relief soon.  Tweaked his back yesterday, orthopedic issues are his primary limiter. Was admitted for PNA post ablation a few days, otherwise no post ablation concerns, groin sites healed well, no trouble swallowing Has an occasional transient heaviness in his low chest when he first gets into bed (for quite a while, pre-date ablation), none with exertion. No unusual SOB No near syncope or syncope. No bleeding or signs of bleeding No PVCs on exam, during device interrogation No changes were made  I saw him March 2025 He is accompanied by [name] with peripheral neuropathy feet pain especially at night And difficulty staying asleep Otherwise No CP, palpitations, SOB No near syncope or syncope No bleeding, signs of bleeding Very brief PATs, on AFlutter 4 seconds <1% burden AP 6.3% VP <1% A lead cap confirm unable to run >> changed to monitor, fixed output to 2.0V (threshold 0.75/0.5) No PVCs  noted/observed  TODAY  He is accompanied by his daughter  Reports for the last 2 mo or so, he has felt fairly awful Tired, weak,  absolutely no stamina They have reached out to his primary provider and pending the start of PT at home  He reports in this time frame a CP as well. It is always there, locates b/l lower rib boarders No change in the discomfort with position change or ambulation/exertion, described a maybe stinging Sometimes when in bed at night feels when he takes a deep breath, hurts worse. No trauma  A couple weeks ago, had ~ 5 hours of HRs > 140's, felt terrible with it, his daughter went to check on him, assumed was Afib Gave him a s/l NTG and old Toprol  50mg  tablet and it did finally start to settle down > resolve Subsequently ~ 12 hours   Device information Abbott dual chamber PPM implanted 09/21/2020  AAD Hx failed sotalol , flecainide , and propafenone, and apparently intolerant of nodal blocking agents Tikosyn  Sept 2022 >>failed to maintain SR stopped Sept 2022 PVI ablation 10/29/2021 (tx with colchicine  post procedure for pericarditis) Amiodarone  started Sept 2022 >> stopped post PVI ablation Amiodarone  restarted 03/20/23 >> stopped July post ablation PVI ablation 06/29/23   Known to have some degree of waxing/waining PVCs (11% 2023)  Past Medical History:  Diagnosis Date   Arthritis    Atrial fibrillation (HCC)    Cancer (HCC)    prostate   Dysrhythmia    afib   History of kidney stones  Past Surgical History:  Procedure Laterality Date   ATRIAL FIBRILLATION ABLATION N/A 10/29/2021   Procedure: ATRIAL FIBRILLATION ABLATION;  Surgeon: Inocencio Soyla Lunger, MD;  Location: [facility] INVASIVE CV LAB;  Service: Cardiovascular;  Laterality: N/A;   ATRIAL FIBRILLATION ABLATION N/A 06/29/2023   Procedure: ATRIAL FIBRILLATION ABLATION;  Surgeon: Inocencio Soyla Lunger, MD;  Location: [facility] INVASIVE CV LAB;  Service: Cardiovascular;  Laterality: N/A;   Bone spur      CARDIOVERSION N/A 06/01/2022   Procedure: CARDIOVERSION;  Surgeon: Kate Lonni CROME, MD;  Location: [facility] ENDOSCOPY;  Service: Cardiovascular;  Laterality: N/A;   CHOLECYSTECTOMY     HERNIA REPAIR     JOINT REPLACEMENT     kidney stones     LAMINECTOMY WITH POSTERIOR LATERAL ARTHRODESIS LEVEL 1 Right 11/25/2019   Procedure: Laminectomy and Foraminotomy  Lumbar Four-Lumbar Five - right, with instrumented fusion;  Surgeon: Joshua Alm RAMAN, MD;  Location: [facility] OR;  Service: Neurosurgery;  Laterality: Right;  Laminectomy and Foraminotomy  Lumbar Four-Lumbar Five - right, with instrumented fusion   PROSTATE SURGERY     TEE WITHOUT CARDIOVERSION N/A 06/01/2022   Procedure: TRANSESOPHAGEAL ECHOCARDIOGRAM (TEE);  Surgeon: Kate Lonni CROME, MD;  Location: [facility] ENDOSCOPY;  Service: Cardiovascular;  Laterality: N/A;    Current Outpatient Medications  Medication Sig Dispense Refill   apixaban  (ELIQUIS ) 5 MG TABS tablet Take 1 tablet (5 mg total) by mouth 2 (two) times daily. 180 tablet 1   gabapentin  (NEURONTIN ) 300 MG capsule Take 300 mg by mouth as needed (sleep).     nitroGLYCERIN  (NITROSTAT ) 0.4 MG SL tablet Place 0.4 mg under the tongue every 5 (five) minutes as needed for chest pain.     No current facility-administered medications for this visit.    Allergies:   Ciprofloxacin, Tramadol, Duloxetine, Hydrocodone-acetaminophen , Nisoldipine, Benadryl [diphenhydramine], Methocarbamol, Ondansetron  hcl, and Sulfa antibiotics   Social History:  The patient  reports that he quit smoking about 27 years ago. His smoking use included cigarettes. He has never used smokeless tobacco. He reports that he does not drink alcohol and does not use drugs.   Family History:  The patient's family history includes Hypertension in his father.  ROS:  Please see the history of present illness.    All other systems are reviewed and otherwise negative.   PHYSICAL EXAM:  VS:  There were no vitals taken for this  visit. BMI: There is no height or weight on file to calculate BMI. Well nourished, well developed, in no acute distress HEENT: normocephalic, atraumatic Neck: no JVD, carotid bruits or masses Cardiac: RRR; no significant murmurs, no rubs, or gallops Lungs: CTA b/l, no wheezing, rhonchi or rales Abd: soft, nontender MS: no deformity, age appropriate atrophy Ext: no edema Skin: warm and dry, no rash Neuro:  No gross deficits appreciated Psych: euthymic mood, full affect  PPM site is stable, no tethering or discomfort   EKG: done today and reviewed by myself SR, PACs, 70bpm (thie patient did in fact have and EKG done TODAY that I read), when I went back to re-review it > unable to find it >> EKG dated in the system 07/09/24 > was signed by me with TODAY's date) A second EKG was done, SR , some baseline movement, [id number], appears similar to older EKGs  Device interrogation done today and reviewed by myself:  Battery and lead measurements are good No sustained episodes of AFib 68 AMS episodes All available EGMs are reviewed, brief AFlutter/AFib Most measured in seconds None lasting  hour (s) No HVR episodes  They are very surprised to hear that noting was noted via his PPM that correlates to the day of his tachycardia HVR episode trigger HR reduced from 150 > 125bpm  07/12/23: TTE (limited) 1. Trivial pericardial effusion.   2. Left ventricular ejection fraction, by estimation, is 55 to 60%. The  left ventricle has normal function. The left ventricular internal cavity  size was mildly dilated.   3. There is no evidence of cardiac tamponade.   4. Mild mitral valve regurgitation.   5. Tricuspid valve regurgitation is moderate to severe.   6. There is mildly elevated pulmonary artery systolic pressure. The  estimated right ventricular systolic pressure is 44.0 mmHg.   7. The inferior vena cava is dilated in size with <50% respiratory  variability, suggesting right atrial pressure of 15  mmHg.    06/29/23: EPS/ablation CONCLUSIONS: 1.  Sinus rhythm upon presentation.   2. Successful electrical isolation and anatomical encircling of all four pulmonary veins with radiofrequency current.  A WACA approach was used 3. Additional left atrial ablation was performed with a standard box lesion created along the posterior wall of the left atrium 4.  Atrial flutter induced with catheter movement, ablation along the anterior wall of the left atrium 5.  Cardioversion from atrial flutter to sinus rhythm  6.  No early apparent complications.  06/01/22: TEE 1. Left ventricular ejection fraction, by estimation, is 50 to 55%. The  left ventricle has low normal function.   2. Right ventricular systolic function is normal. The right ventricular  size is normal.   3. No left atrial/left atrial appendage thrombus was detected.   4. Right atrial size was mildly dilated.   5. The mitral valve is normal in structure. Trivial mitral valve  regurgitation.   6. Tricuspid valve regurgitation is mild to moderate.   7. The aortic valve is tricuspid. Aortic valve regurgitation is not  visualized.   Conclusion(s)/Recommendation(s): No LA/LAA thrombus identified. Successful  cardioversion performed.    10/29/21; EPS/ablation CONCLUSIONS: 1. Atrial fibrillation upon presentation.   2. Successful electrical isolation and anatomical encircling of all four pulmonary veins with radiofrequency current.  A WACA approach was used 3. Additional left atrial ablation was performed with a standard box lesion created along the posterior wall of the left atrium 4. No early apparent complications.   Recent Labs: 11/14/2023: Hemoglobin 10.0; Platelets 323 11/17/2023: BUN 14; Creatinine, Ser 1.36; Potassium 5.1; Sodium 140  No results found for requested labs within last 365 days.   CrCl cannot be calculated (Patient's most recent lab result is older than the maximum 21 days allowed.).   Wt Readings from Last  3 Encounters:  02/19/24 202 lb (91.6 kg)  11/07/23 201 lb 3.2 oz (91.3 kg)  07/27/23 200 lb (90.7 kg)     Other studies reviewed: Additional studies/records reviewed today include: summarized above  ASSESSMENT AND PLAN:  Persistent AFib CHA2DS2Vasc is 2, on eliquis , appropriately dosed <1 % burden (PATs/AFlutter) by his device  Tachy-palpitations Noting via his device to explain the symptoms, daughter said HR 140s by his BP cuff and palpated pulse The pt also reports that his BP cuff will on occasion report HR 40's and worries about the pacer function/reliability  Will have him wear a 2 week monitor I have reduced HVR episode trigger rate as above Lopressor  25mg  for sustained palpitations lasting more then 30 minutes  PPM Intact function Programmed as above  PVCs None noted today during his  interrogation or exam  4. Secondary hypercoaguable state  5. Fatigue, weakness, atypical CP Echo (given hx of small pericardial effusion and pleuritic type component on occasion) Labs Urged to see his PMD for any other evaluation given such a clear/acute change in the last 2 mo or so   Disposition: back in 44mo, sooner, sooner if needed     Current medicines are reviewed at length with the patient today.  The patient did not have any concerns regarding medicines.  Bonney [name], PA-C 09/15/2024 10:02 AM     [facility] [address] [employer] [address] [phone] (office)  [phone] (fax)

## 2024-09-18 ENCOUNTER — Ambulatory Visit: Attending: Cardiology | Admitting: Physician Assistant

## 2024-09-18 ENCOUNTER — Other Ambulatory Visit

## 2024-09-18 VITALS — BP 126/58 | HR 70 | Ht 75.0 in | Wt 191.0 lb

## 2024-09-18 DIAGNOSIS — R001 Bradycardia, unspecified: Secondary | ICD-10-CM

## 2024-09-18 DIAGNOSIS — I4819 Other persistent atrial fibrillation: Secondary | ICD-10-CM | POA: Diagnosis not present

## 2024-09-18 DIAGNOSIS — I495 Sick sinus syndrome: Secondary | ICD-10-CM | POA: Diagnosis not present

## 2024-09-18 DIAGNOSIS — I3139 Other pericardial effusion (noninflammatory): Secondary | ICD-10-CM | POA: Diagnosis not present

## 2024-09-18 DIAGNOSIS — Z79899 Other long term (current) drug therapy: Secondary | ICD-10-CM

## 2024-09-18 DIAGNOSIS — D6869 Other thrombophilia: Secondary | ICD-10-CM | POA: Diagnosis not present

## 2024-09-18 DIAGNOSIS — R5383 Other fatigue: Secondary | ICD-10-CM

## 2024-09-18 DIAGNOSIS — Z950 Presence of cardiac pacemaker: Secondary | ICD-10-CM | POA: Diagnosis not present

## 2024-09-18 DIAGNOSIS — I493 Ventricular premature depolarization: Secondary | ICD-10-CM

## 2024-09-18 LAB — CUP PACEART INCLINIC DEVICE CHECK
Battery Remaining Longevity: 90 mo
Battery Voltage: 3.01 V
Brady Statistic RA Percent Paced: 14 %
Brady Statistic RV Percent Paced: 0.13 %
Date Time Interrogation Session: 20251008131417
Implantable Lead Connection Status: 753985
Implantable Lead Connection Status: 753985
Implantable Lead Implant Date: 20211011
Implantable Lead Implant Date: 20211011
Implantable Lead Location: 753859
Implantable Lead Location: 753860
Implantable Pulse Generator Implant Date: 20211011
Lead Channel Impedance Value: 375 Ohm
Lead Channel Impedance Value: 450 Ohm
Lead Channel Pacing Threshold Amplitude: 0.5 V
Lead Channel Pacing Threshold Amplitude: 0.75 V
Lead Channel Pacing Threshold Amplitude: 0.75 V
Lead Channel Pacing Threshold Pulse Width: 0.5 ms
Lead Channel Pacing Threshold Pulse Width: 0.5 ms
Lead Channel Pacing Threshold Pulse Width: 0.5 ms
Lead Channel Sensing Intrinsic Amplitude: 1.7 mV
Lead Channel Sensing Intrinsic Amplitude: 8.5 mV
Lead Channel Setting Pacing Amplitude: 0.75 V
Lead Channel Setting Pacing Amplitude: 2 V
Lead Channel Setting Pacing Pulse Width: 0.5 ms
Lead Channel Setting Sensing Sensitivity: 2 mV
Pulse Gen Model: 2272
Pulse Gen Serial Number: 3869163

## 2024-09-18 MED ORDER — NITROGLYCERIN 0.4 MG SL SUBL: 0.4000 mg | SUBLINGUAL_TABLET | SUBLINGUAL | 3 refills | Status: AC | PRN

## 2024-09-18 MED ORDER — METOPROLOL TARTRATE 25 MG PO TABS: 25.0000 mg | ORAL_TABLET | Freq: Every day | ORAL | 1 refills | Status: AC | PRN

## 2024-09-18 NOTE — Patient Instructions (Addendum)
 Medication Instructions:   START TAKING:  METOPROLOL  ( LOPRESSOR )  25 MG AS NEEDED FOR PALPITATIONS LASTING LONGER THAN 30 MINUTES    *If you need a refill on your cardiac medications before your next appointment, please call your pharmacy*   Lab Work:   PLEASE GO DOWN STAIRS  [facility]  FIRST FLOOR   ( GET OFF ELEVATORS WALK TOWARDS WAITING AREA LAB LOCATED BY PHARMACY): BMET AND CBC TODAY       If you have labs (blood work) drawn today and your tests are completely normal, you will receive your results only by: MyChart Message (if you have MyChart) OR A paper copy in the mail If you have any lab test that is abnormal or we need to change your treatment, we will call you to review the results.    Testing/Procedures: Your physician has requested that you have an echocardiogram. Echocardiography is a painless test that uses sound waves to create images of your heart. It provides your doctor with information about the size and shape of your heart and how well your heart's chambers and valves are working. This procedure takes approximately one hour. There are no restrictions for this procedure. Please do NOT wear cologne, perfume, aftershave, or lotions (deodorant is allowed). Please arrive 15 minutes prior to your appointment time.  Please note: We ask at that you not bring children with you during ultrasound (echo/ vascular) testing. Due to room size and safety concerns, children are not allowed in the ultrasound rooms during exams. Our front office staff cannot provide observation of children in our lobby area while testing is being conducted. An adult accompanying a patient to their appointment will only be allowed in the ultrasound room at the discretion of the ultrasound technician under special circumstances. We apologize for any inconvenience.   Your physician has recommended that you wear an event monitor. Event monitors are medical devices that record the heart's electrical activity.  Doctors most often us  these monitors to diagnose arrhythmias. Arrhythmias are problems with the speed or rhythm of the heartbeat. The monitor is a small, portable device. You can wear one while you do your normal daily activities. This is usually used to diagnose what is causing palpitations/syncope (passing out).    Follow-Up: At [facility], you and your health needs are our priority.  As part of our continuing mission to provide you with exceptional heart care, our providers are all part of one team.  This team includes your primary Cardiologist (physician) and Advanced Practice Providers or APPs (Physician Assistants and Nurse Practitioners) who all work together to provide you with the care you need, when you need it.  Your next appointment:    6 month(s)  Provider:    You may see  [name], PA-C    We recommend signing up for the patient portal called MyChart.  Sign up information is provided on this After Visit Summary.  MyChart is used to connect with patients for Virtual Visits (Telemedicine).  Patients are able to view lab/test results, encounter notes, upcoming appointments, etc.  Non-urgent messages can be sent to your provider as well.   To learn more about what you can do with MyChart, go to [email or web address].   Other Instructions   ZIO XT- Long Term Monitor Instructions  Your physician has requested you wear a ZIO patch monitor for 14 days.  This is a single patch monitor. [employer] supplies one patch monitor per enrollment. Additional stickers are not available. Please do  not apply patch if you will be having a Nuclear Stress Test,  Echocardiogram, Cardiac CT, MRI, or Chest Xray during the period you would be wearing the  monitor. The patch cannot be worn during these tests. You cannot remove and re-apply the  ZIO XT patch monitor.  Your ZIO patch monitor will be mailed 3 day USPS to your address on file. It may take 3-5 days  to receive your monitor  after you have been enrolled.  Once you have received your monitor, please review the enclosed instructions. Your monitor  has already been registered assigning a specific monitor serial # to you.  Billing and Patient Assistance Program Information  We have supplied [employer] with any of your insurance information on file for billing purposes. [employer] offers a sliding scale Patient Assistance Program for patients that do not have  insurance, or whose insurance does not completely cover the cost of the ZIO monitor.  You must apply for the Patient Assistance Program to qualify for this discounted rate.  To apply, please call [employer] at [phone], select option 4, select option 2, ask to apply for  Patient Assistance Program. Meredeth will ask your household income, and how many people  are in your household. They will quote your out-of-pocket cost based on that information.  [employer] will also be able to set up a 73-month, interest-free payment plan if needed.  Applying the monitor   Shave hair from upper left chest.  Hold abrader disc by orange tab. Rub abrader in 40 strokes over the upper left chest as  indicated in your monitor instructions.  Clean area with 4 enclosed alcohol pads. Let dry.  Apply patch as indicated in monitor instructions. Patch will be placed under collarbone on left  side of chest with arrow pointing upward.  Rub patch adhesive wings for 2 minutes. Remove white label marked 1. Remove the white  label marked 2. Rub patch adhesive wings for 2 additional minutes.  While looking in a mirror, press and release button in center of patch. A small green light will  flash 3-4 times. This will be your only indicator that the monitor has been turned on.  Do not shower for the first 24 hours. You may shower after the first 24 hours.  Press the button if you feel a symptom. You will hear a small click. Record Date, Time and  Symptom in the Patient Logbook.  When you are  ready to remove the patch, follow instructions on the last 2 pages of Patient  Logbook. Stick patch monitor onto the last page of Patient Logbook.  Place Patient Logbook in the blue and white box. Use locking tab on box and tape box closed  securely. The blue and white box has prepaid postage on it. Please place it in the mailbox as  soon as possible. Your physician should have your test results approximately 7 days after the  monitor has been mailed back to [facility].  Call [facility] Customer Care at [phone] if you have questions regarding  your ZIO XT patch monitor. Call them immediately if you see an orange light blinking on your  monitor.  If your monitor falls off in less than 4 days, contact our Monitor department at [phone].  If your monitor becomes loose or falls off after 4 days call [employer] at [phone] for  suggestions on securing your monitor

## 2024-09-18 NOTE — Progress Notes (Unsigned)
Enrolled for [employer] to mail a ZIO AT Live Telemetry monitor to patients address on file.   Dr. Curt Bears to read.

## 2024-09-19 DIAGNOSIS — Z79899 Other long term (current) drug therapy: Secondary | ICD-10-CM | POA: Diagnosis not present

## 2024-09-19 LAB — BASIC METABOLIC PANEL WITH GFR
BUN/Creatinine Ratio: 13 (ref 10–24)
BUN: 15 mg/dL (ref 8–27)
CO2: 19 mmol/L — ABNORMAL LOW (ref 20–29)
Calcium: 9.2 mg/dL (ref 8.6–10.2)
Chloride: 105 mmol/L (ref 96–106)
Creatinine, Ser: 1.19 mg/dL (ref 0.76–1.27)
Glucose: 98 mg/dL (ref 70–99)
Potassium: 5 mmol/L (ref 3.5–5.2)
Sodium: 139 mmol/L (ref 134–144)
eGFR: 59 mL/min/1.73 — ABNORMAL LOW (ref >59)

## 2024-09-19 LAB — CBC
Hematocrit: 27.1 % — ABNORMAL LOW (ref 37.5–51.0)
Hemoglobin: 8.9 g/dL — ABNORMAL LOW (ref 13.0–17.7)
MCH: 32.1 pg (ref 26.6–33.0)
MCHC: 32.8 g/dL (ref 31.5–35.7)
MCV: 98 fL — ABNORMAL HIGH (ref 79–97)
Platelets: 268 x10E3/uL (ref 150–450)
RBC: 2.77 x10E6/uL — ABNORMAL LOW (ref 4.14–5.80)
RDW: 24.4 % — ABNORMAL HIGH (ref 11.6–15.4)
WBC: 4.9 x10E3/uL (ref 3.4–10.8)

## 2024-09-20 ENCOUNTER — Ambulatory Visit: Payer: Self-pay | Admitting: Physician Assistant

## 2024-09-23 DIAGNOSIS — R001 Bradycardia, unspecified: Secondary | ICD-10-CM | POA: Diagnosis not present

## 2024-10-03 ENCOUNTER — Telehealth: Payer: Self-pay | Admitting: Cardiology

## 2024-10-03 NOTE — Telephone Encounter (Signed)
 Notified by Meredeth that patient had first documented episode of atrial fibrillation this afternoon. HR averaged 73 BPM. Per chart review, patient has a known history of atrial fibrillation and is currently on eliquis  5 mg BID.   As HR was well controlled and patient is already on appropriate anticoagulation, no changes to treatment plan. Continue monitor   Rollo FABIENE Louder, PA-C 10/03/2024 5:33 PM

## 2024-10-08 DIAGNOSIS — E7800 Pure hypercholesterolemia, unspecified: Secondary | ICD-10-CM | POA: Diagnosis not present

## 2024-10-10 DIAGNOSIS — J984 Other disorders of lung: Secondary | ICD-10-CM | POA: Diagnosis not present

## 2024-10-10 DIAGNOSIS — Z23 Encounter for immunization: Secondary | ICD-10-CM | POA: Diagnosis not present

## 2024-10-10 DIAGNOSIS — C61 Malignant neoplasm of prostate: Secondary | ICD-10-CM | POA: Diagnosis not present

## 2024-10-10 DIAGNOSIS — N281 Cyst of kidney, acquired: Secondary | ICD-10-CM | POA: Diagnosis not present

## 2024-10-10 DIAGNOSIS — E7800 Pure hypercholesterolemia, unspecified: Secondary | ICD-10-CM | POA: Diagnosis not present

## 2024-10-10 DIAGNOSIS — D509 Iron deficiency anemia, unspecified: Secondary | ICD-10-CM | POA: Diagnosis not present

## 2024-10-10 DIAGNOSIS — N1831 Chronic kidney disease, stage 3a: Secondary | ICD-10-CM | POA: Diagnosis not present

## 2024-10-10 DIAGNOSIS — J479 Bronchiectasis, uncomplicated: Secondary | ICD-10-CM | POA: Diagnosis not present

## 2024-10-10 DIAGNOSIS — G2581 Restless legs syndrome: Secondary | ICD-10-CM | POA: Diagnosis not present

## 2024-10-10 DIAGNOSIS — I4819 Other persistent atrial fibrillation: Secondary | ICD-10-CM | POA: Diagnosis not present

## 2024-10-11 ENCOUNTER — Ambulatory Visit (HOSPITAL_COMMUNITY)
Admission: RE | Admit: 2024-10-11 | Discharge: 2024-10-11 | Disposition: A | Discharge status: Home / Self Care | Source: Ambulatory Visit | Attending: Internal Medicine | Admitting: Internal Medicine

## 2024-10-11 DIAGNOSIS — I3139 Other pericardial effusion (noninflammatory): Secondary | ICD-10-CM | POA: Insufficient documentation

## 2024-10-11 LAB — ECHOCARDIOGRAM COMPLETE
Area-P 1/2: 3.83 cm2
MV M vel: 4.31 m/s
MV Peak grad: 74.3 mmHg
S' Lateral: 3.2 cm

## 2024-10-14 ENCOUNTER — Other Ambulatory Visit: Payer: Self-pay | Admitting: Cardiology

## 2024-10-14 DIAGNOSIS — Z950 Presence of cardiac pacemaker: Secondary | ICD-10-CM

## 2024-10-14 NOTE — Telephone Encounter (Signed)
 Prescription refill request for Eliquis  received. Indication:afib Last office visit:10/25 Scr:1.34  10/25 Age: 87 Weight:86.6  kg  Prescription refilled

## 2024-10-15 ENCOUNTER — Telehealth: Payer: Self-pay | Admitting: Cardiology

## 2024-10-15 DIAGNOSIS — R001 Bradycardia, unspecified: Secondary | ICD-10-CM | POA: Diagnosis not present

## 2024-10-15 NOTE — Telephone Encounter (Signed)
 Final report has been posted and [employer] wanted to make us  aware of a change in interpretation 10/23 3:13 pm - labeled as 1st documented afib, actually noted to be atrial pacing in sinus rhythm. Will forward to ordering provider.

## 2024-10-15 NOTE — Telephone Encounter (Signed)
 Caller Joie) is reporting abnormal findings.

## 2024-10-18 ENCOUNTER — Ambulatory Visit: Admission: EM | Admit: 2024-10-18 | Discharge: 2024-10-18 | Disposition: A | Discharge status: Home / Self Care

## 2024-10-18 ENCOUNTER — Encounter: Payer: Self-pay | Admitting: Emergency Medicine

## 2024-10-18 DIAGNOSIS — B029 Zoster without complications: Secondary | ICD-10-CM

## 2024-10-18 NOTE — ED Triage Notes (Signed)
 Pt presents with painful rash on right leg for 2-3 days.

## 2024-10-18 NOTE — ED Provider Notes (Signed)
 [facility]    CSN: [id number] Arrival date & time: 10/18/24  1212      History   Chief Complaint Chief Complaint  Patient presents with   Rash    HPI Trinity Hyland is a 88 y.o. male presenting with painful rash on the right lower extremity.  Initially the rash was vesicular, but now it has started to crust.  H/o a fib on anticoagulation.  They corresponded with primary care provider via MyChart, and he was suspected to have herpes zoster.  Antiviral and lidocaine  patches were sent in, but the patient was unaware of this.  Has been doing warm compresses, CBD, acetaminophen  at home with minimal relief.  Unable to take gabapentin , hydrocodone, tramadol due to intolerance.  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   Pacemaker 07/01/2023   Atrial flutter with rapid ventricular response (HCC) 05/30/2022   Intractable pain 04/16/2022   Vomiting 04/16/2022   CKD-3A 04/16/2022   Acute right hip pain 04/16/2022   Normocytic anemia 04/16/2022   Hypercoagulable state due to persistent atrial fibrillation (HCC) 08/27/2021   Persistent atrial fibrillation (HCC) 08/17/2021   S/P lumbar fusion 11/25/2019   Carcinoma of prostate (HCC) 09/07/2019   Lumbar radiculopathy 09/07/2019   Degeneration of lumbar intervertebral disc 09/07/2019   History of total replacement of both hip joints 08/01/2019   Iliotibial band syndrome of right side 07/27/2019   Macrocytic anemia 07/26/2019   Hypotension 07/26/2019   Bronchiectasis without complication (HCC) 02/22/2019   DOE (dyspnea on exertion) 10/19/2018   Lung nodule < 6cm on CT 10/09/2018   Occipital neuritis 08/29/2016   Bilateral hearing  loss 06/22/2016   Post herpetic neuralgia 02/02/2016   Current use of long term anticoagulation 12/18/2015   Calcification of spleen 09/13/2014   Diverticulosis 09/13/2014   Bilateral renal cysts 09/13/2014   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   Hemangioma of liver 09/13/2014   Male erectile dysfunction, unspecified 09/13/2014   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   Paraphimosis 09/13/2014   Polyp of colon 09/13/2014   Restless legs syndrome 09/13/2014   Spondylosis without myelopathy or radiculopathy, cervical region 09/13/2014   Urethral stricture 09/13/2014   Spondylosis without myelopathy or radiculopathy, lumbosacral region 09/13/2014   Anxiety 09/13/2014   Chest pain at rest 12/11/2011    Class: Acute   CAD (coronary artery disease), native coronary artery 12/11/2011    Class: Chronic   HTN (hypertension) 12/11/2011   PAF (paroxysmal atrial fibrillation) (HCC) 12/11/2011    Past Surgical History:  Procedure Laterality Date   ATRIAL FIBRILLATION ABLATION N/A 10/29/2021   Procedure: ATRIAL FIBRILLATION ABLATION;  Surgeon: Inocencio Soyla Lunger, MD;  Location: [facility] INVASIVE CV LAB;  Service: Cardiovascular;  Laterality: N/A;   ATRIAL FIBRILLATION ABLATION N/A 06/29/2023   Procedure: ATRIAL FIBRILLATION ABLATION;  Surgeon: Inocencio Soyla Lunger, MD;  Location: [facility] INVASIVE CV LAB;  Service: Cardiovascular;  Laterality: N/A;   Bone spur     CARDIOVERSION N/A 06/01/2022   Procedure: CARDIOVERSION;  Surgeon: Kate Lonni CROME, MD;  Location: [facility] ENDOSCOPY;  Service: Cardiovascular;  Laterality: N/A;   CHOLECYSTECTOMY     HERNIA REPAIR  JOINT REPLACEMENT     kidney stones     LAMINECTOMY WITH POSTERIOR LATERAL ARTHRODESIS LEVEL 1 Right 11/25/2019   Procedure: Laminectomy and Foraminotomy  Lumbar Four-Lumbar Five - right, with instrumented fusion;  Surgeon: Joshua Alm RAMAN, MD;  Location: [facility] OR;  Service: Neurosurgery;  Laterality: Right;  Laminectomy and Foraminotomy   Lumbar Four-Lumbar Five - right, with instrumented fusion   PROSTATE SURGERY     TEE WITHOUT CARDIOVERSION N/A 06/01/2022   Procedure: TRANSESOPHAGEAL ECHOCARDIOGRAM (TEE);  Surgeon: Kate Lonni CROME, MD;  Location: [facility] ENDOSCOPY;  Service: Cardiovascular;  Laterality: N/A;       Home Medications    Prior to Admission medications   Medication Sig Start Date End Date Taking? Authorizing Provider  ELIQUIS  5 MG TABS tablet TAKE 1 TABLET BY MOUTH 2 TIMES A DAY 10/14/24   Camnitz, Soyla Lunger, MD  ferrous sulfate 325 (65 FE) MG tablet 325 mg by oral route.    [provider]  metoprolol  tartrate (LOPRESSOR ) 25 MG tablet Take 1 tablet (25 mg total) by mouth daily as needed (PALPITATIONS LSTING LONGER THATN 30 MINUTES). Patient not taking: Reported on 10/18/2024 09/18/24   Leverne Charlies Helling, PA-C  nitroGLYCERIN  (NITROSTAT ) 0.4 MG SL tablet Place 1 tablet (0.4 mg total) under the tongue every 5 (five) minutes as needed for chest pain. 09/18/24   Leverne Charlies Helling, PA-C    Family History Family History  Problem Relation Age of Onset   Hypertension Father    CAD Neg Hx    Heart failure Neg Hx    Stroke Neg Hx     Social History Social History   Tobacco Use   Smoking status: Former    Current packs/day: 0.00    Types: Cigarettes    Quit date: 12/10/1996    Years since quitting: 27.8   Smokeless tobacco: Never   Tobacco comments:    Former smoker (08/27/2021)  Vaping Use   Vaping status: Never Used  Substance Use Topics   Alcohol use: No   Drug use: No     Allergies   Ciprofloxacin, Tramadol, Duloxetine, Hydrocodone-acetaminophen , Nisoldipine, Benadryl [diphenhydramine], Methocarbamol, Ondansetron  hcl, and Sulfa antibiotics   Review of Systems Review of Systems  Skin:  Positive for rash.     Physical Exam Triage Vital Signs ED Triage Vitals  Encounter Vitals Group     BP      Girls Systolic BP Percentile      Girls Diastolic BP Percentile      Boys  Systolic BP Percentile      Boys Diastolic BP Percentile      Pulse      Resp      Temp      Temp src      SpO2      Weight      Height      Head Circumference      Peak Flow      Pain Score      Pain Loc      Pain Education      Exclude from Growth Chart    No data found.  Updated Vital Signs BP 131/70 (BP Location: Right Arm)   Pulse 82   Temp (!) 97.5 F (36.4 C) (Oral)   Resp 17   SpO2 96%   Visual Acuity Right Eye Distance:   Left Eye Distance:   Bilateral Distance:    Right Eye Near:   Left Eye Near:    Bilateral Near:  Physical Exam Vitals reviewed.  Constitutional:      General: He is not in acute distress.    Appearance: Normal appearance. He is not ill-appearing.  HENT:     Head: Normocephalic and atraumatic.  Pulmonary:     Effort: Pulmonary effort is normal.  Skin:    Comments: See image below. Right lower extremity with 3 patches of erythematous macular rash.  Few vesicles.  Crusting present.  There is no warmth or induration.  The rash is exquisitely tender.  Neurological:     General: No focal deficit present.     Mental Status: He is alert and oriented to person, place, and time.  Psychiatric:        Mood and Affect: Mood normal.        Behavior: Behavior normal.        Thought Content: Thought content normal.        Judgment: Judgment normal.       UC Treatments / Results  Labs (all labs ordered are listed, but only abnormal results are displayed) Labs Reviewed - No data to display  EKG   Radiology No results found.  Procedures Procedures (including critical care time)  Medications Ordered in UC Medications - No data to display  Initial Impression / Assessment and Plan / UC Course  I have reviewed the triage vital signs and the nursing notes.  Pertinent labs & imaging results that were available during my care of the patient were reviewed by me and considered in my medical decision making (see chart for details).      Patient is a pleasant 88 year old male presenting with herpes zoster of the right lower extremity.  He is afebrile and nontachycardic.  The patient describes the rash as initially vesicular, but has started to crust.  He is vaccinated against herpes zoster in the last 5 years.  Primary care provider has already sent in Valtrex and lidocaine  patches, but the patient was unaware of this; he understands to pick up these prescriptions today.  He is not able to take NSAIDs due to anticoagulation, and he does not tolerate hydrocodone/tramadol/gabapentin , so I did not send additional pain medication today.  Final Clinical Impressions(s) / UC Diagnoses   Final diagnoses:  Herpes zoster without complication     Discharge Instructions      - Pick up the prescriptions for lidocaine  patch and antiviral that your primary care provider has already sent in. -Acetaminophen  as needed. -Avoid people who are immunocompromised or not vaccinated against chickenpox until the lesions crusted over.  This may take about 5 more days.     ED Prescriptions   None    PDMP not reviewed this encounter.   Arlyss Leita BRAVO, PA-C 10/18/24 1254

## 2024-10-18 NOTE — Discharge Instructions (Signed)
-   Pick up the prescriptions for lidocaine  patch and antiviral that your primary care provider has already sent in. -Acetaminophen  as needed. -Avoid people who are immunocompromised or not vaccinated against chickenpox until the lesions crusted over.  This may take about 5 more days.

## 2024-11-08 DIAGNOSIS — E785 Hyperlipidemia, unspecified: Secondary | ICD-10-CM | POA: Diagnosis not present

## 2024-11-08 DIAGNOSIS — Z66 Do not resuscitate: Secondary | ICD-10-CM | POA: Diagnosis not present

## 2024-11-08 DIAGNOSIS — K5900 Constipation, unspecified: Secondary | ICD-10-CM | POA: Diagnosis not present

## 2024-11-08 DIAGNOSIS — N1831 Chronic kidney disease, stage 3a: Secondary | ICD-10-CM | POA: Diagnosis not present

## 2024-11-08 DIAGNOSIS — R0600 Dyspnea, unspecified: Secondary | ICD-10-CM | POA: Diagnosis not present

## 2024-11-08 DIAGNOSIS — Z7901 Long term (current) use of anticoagulants: Secondary | ICD-10-CM | POA: Diagnosis not present

## 2024-11-08 DIAGNOSIS — Z8546 Personal history of malignant neoplasm of prostate: Secondary | ICD-10-CM | POA: Diagnosis not present

## 2024-11-08 DIAGNOSIS — K2950 Unspecified chronic gastritis without bleeding: Secondary | ICD-10-CM | POA: Diagnosis not present

## 2024-11-08 DIAGNOSIS — Z981 Arthrodesis status: Secondary | ICD-10-CM | POA: Diagnosis not present

## 2024-11-08 DIAGNOSIS — Z885 Allergy status to narcotic agent status: Secondary | ICD-10-CM | POA: Diagnosis not present

## 2024-11-08 DIAGNOSIS — K921 Melena: Secondary | ICD-10-CM | POA: Diagnosis not present

## 2024-11-08 DIAGNOSIS — Z886 Allergy status to analgesic agent status: Secondary | ICD-10-CM | POA: Diagnosis not present

## 2024-11-08 DIAGNOSIS — K922 Gastrointestinal hemorrhage, unspecified: Secondary | ICD-10-CM | POA: Diagnosis not present

## 2024-11-08 DIAGNOSIS — K319 Disease of stomach and duodenum, unspecified: Secondary | ICD-10-CM | POA: Diagnosis not present

## 2024-11-08 DIAGNOSIS — K2971 Gastritis, unspecified, with bleeding: Secondary | ICD-10-CM | POA: Diagnosis not present

## 2024-11-08 DIAGNOSIS — Z951 Presence of aortocoronary bypass graft: Secondary | ICD-10-CM | POA: Diagnosis not present

## 2024-11-08 DIAGNOSIS — G2581 Restless legs syndrome: Secondary | ICD-10-CM | POA: Diagnosis not present

## 2024-11-08 DIAGNOSIS — Z888 Allergy status to other drugs, medicaments and biological substances status: Secondary | ICD-10-CM | POA: Diagnosis not present

## 2024-11-08 DIAGNOSIS — Z950 Presence of cardiac pacemaker: Secondary | ICD-10-CM | POA: Diagnosis not present

## 2024-11-08 DIAGNOSIS — D500 Iron deficiency anemia secondary to blood loss (chronic): Secondary | ICD-10-CM | POA: Diagnosis not present

## 2024-11-08 DIAGNOSIS — I959 Hypotension, unspecified: Secondary | ICD-10-CM | POA: Diagnosis not present

## 2024-11-08 DIAGNOSIS — K5909 Other constipation: Secondary | ICD-10-CM | POA: Diagnosis not present

## 2024-11-08 DIAGNOSIS — K2970 Gastritis, unspecified, without bleeding: Secondary | ICD-10-CM | POA: Diagnosis not present

## 2024-11-08 DIAGNOSIS — K223 Perforation of esophagus: Secondary | ICD-10-CM | POA: Diagnosis not present

## 2024-11-08 DIAGNOSIS — J189 Pneumonia, unspecified organism: Secondary | ICD-10-CM | POA: Diagnosis not present

## 2024-11-08 DIAGNOSIS — Z45018 Encounter for adjustment and management of other part of cardiac pacemaker: Secondary | ICD-10-CM | POA: Diagnosis not present

## 2024-11-08 DIAGNOSIS — I495 Sick sinus syndrome: Secondary | ICD-10-CM | POA: Diagnosis not present

## 2024-11-08 DIAGNOSIS — I2510 Atherosclerotic heart disease of native coronary artery without angina pectoris: Secondary | ICD-10-CM | POA: Diagnosis not present

## 2024-11-08 DIAGNOSIS — Z87891 Personal history of nicotine dependence: Secondary | ICD-10-CM | POA: Diagnosis not present

## 2024-11-08 DIAGNOSIS — I4510 Unspecified right bundle-branch block: Secondary | ICD-10-CM | POA: Diagnosis not present

## 2024-11-08 DIAGNOSIS — R918 Other nonspecific abnormal finding of lung field: Secondary | ICD-10-CM | POA: Diagnosis not present

## 2024-11-08 DIAGNOSIS — M542 Cervicalgia: Secondary | ICD-10-CM | POA: Diagnosis not present

## 2024-11-08 DIAGNOSIS — R1030 Lower abdominal pain, unspecified: Secondary | ICD-10-CM | POA: Diagnosis not present

## 2024-11-08 DIAGNOSIS — I4819 Other persistent atrial fibrillation: Secondary | ICD-10-CM | POA: Diagnosis not present

## 2024-11-08 DIAGNOSIS — D649 Anemia, unspecified: Secondary | ICD-10-CM | POA: Diagnosis not present

## 2024-11-08 DIAGNOSIS — Z79899 Other long term (current) drug therapy: Secondary | ICD-10-CM | POA: Diagnosis not present

## 2024-11-08 DIAGNOSIS — B9681 Helicobacter pylori [H. pylori] as the cause of diseases classified elsewhere: Secondary | ICD-10-CM | POA: Diagnosis not present

## 2024-11-08 DIAGNOSIS — I440 Atrioventricular block, first degree: Secondary | ICD-10-CM | POA: Diagnosis not present

## 2024-11-08 DIAGNOSIS — R195 Other fecal abnormalities: Secondary | ICD-10-CM | POA: Diagnosis not present

## 2024-11-08 DIAGNOSIS — Z96643 Presence of artificial hip joint, bilateral: Secondary | ICD-10-CM | POA: Diagnosis not present

## 2024-11-08 DIAGNOSIS — R1013 Epigastric pain: Secondary | ICD-10-CM | POA: Diagnosis not present

## 2024-11-08 DIAGNOSIS — N200 Calculus of kidney: Secondary | ICD-10-CM | POA: Diagnosis not present

## 2024-11-26 ENCOUNTER — Telehealth: Payer: Self-pay | Admitting: Physician Assistant

## 2024-11-26 NOTE — Telephone Encounter (Signed)
 Medford Nakai, PA with [facility] called back in. States he will fax info over.

## 2024-11-26 NOTE — Telephone Encounter (Signed)
 Medford Nakai, PA with [facility] called in asking to speak with Charlies, PA about this pt. He states he had a GI bleed. He would like to speak about pt's Eliquis  and AFIB   He starts his clinic from 1-4pm. He states the number given is his personal number so you may text him or call at earliest convenience.

## 2024-11-27 ENCOUNTER — Telehealth: Payer: Self-pay

## 2024-11-27 ENCOUNTER — Telehealth: Payer: Self-pay | Admitting: Physician Assistant

## 2024-11-27 NOTE — Telephone Encounter (Signed)
 Spoke with [employer]. She spoke with Medford, [place] and everything has been handled

## 2024-11-27 NOTE — Telephone Encounter (Signed)
 Patient daughter returned call and assisted patient in sending a manual transmission. Transmission received and AT/AF burden <1%. Will send transmission to [name], PA as requested. See attachment in PA for full report.   Will continue to monitor and update accordingly.   _____________________________________________________________

## 2024-11-27 NOTE — Telephone Encounter (Signed)
 Medford Nakai, PA called back he is sending over his ov note. Still would like to speak to [facility].

## 2024-11-27 NOTE — Telephone Encounter (Signed)
 Todd Quan, PA, w/[facility] reached out to discuss concerns of recent clinically significant GIB (s) requiring transfusion/hospitalization. 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 Inquired about possibility of coming off his OAC. Reviewed our last visit His AFib burden quite low, <1% in October, and prior remote as well. Would be reasonable by this information given a reliable way of monitoring burden to watch him closely off OAC.  Todd will communicate with the patient plan.  I will ask our device clinic to get a remote done today/tomorrow to evaluate his AF burden now. If it is different/or high, will re-visit recs and discuss with the patient  will have our scheduler call him to get f/u with us  in January  [name], PA-C

## 2024-11-27 NOTE — Telephone Encounter (Signed)
 Per [name], PA - Patient has had significant GIB recently, d/w GI, about holding his Eliquis . AF burden has been known to be low.  Spoke w/ patient daughter regarding need for manual transmission to check AF burden at request of [name], [place]. Educated patient daughter on how to send manual transmission. Verbalized understanding and states she will go see patient and attempt to send transmission.   Will continue to monitor for incoming transmission and will send to [name], PA once received.

## 2024-11-30 ENCOUNTER — Other Ambulatory Visit: Payer: Self-pay

## 2024-11-30 ENCOUNTER — Ambulatory Visit (HOSPITAL_COMMUNITY)

## 2024-11-30 ENCOUNTER — Ambulatory Visit
Admission: EM | Admit: 2024-11-30 | Discharge: 2024-11-30 | Disposition: A | Discharge status: Home / Self Care | Attending: Emergency Medicine | Admitting: Emergency Medicine

## 2024-11-30 ENCOUNTER — Emergency Department (HOSPITAL_COMMUNITY)

## 2024-11-30 ENCOUNTER — Observation Stay (HOSPITAL_COMMUNITY)
Admission: EM | Admit: 2024-11-30 | Discharge: 2024-12-01 | Disposition: A | Source: Ambulatory Visit | Attending: Internal Medicine | Admitting: Internal Medicine

## 2024-11-30 ENCOUNTER — Encounter (HOSPITAL_COMMUNITY): Payer: Self-pay | Admitting: Internal Medicine

## 2024-11-30 ENCOUNTER — Ambulatory Visit: Admitting: Radiology

## 2024-11-30 DIAGNOSIS — D649 Anemia, unspecified: Secondary | ICD-10-CM | POA: Diagnosis present

## 2024-11-30 DIAGNOSIS — I495 Sick sinus syndrome: Secondary | ICD-10-CM | POA: Diagnosis not present

## 2024-11-30 DIAGNOSIS — J189 Pneumonia, unspecified organism: Principal | ICD-10-CM | POA: Diagnosis present

## 2024-11-30 DIAGNOSIS — R0602 Shortness of breath: Secondary | ICD-10-CM

## 2024-11-30 DIAGNOSIS — Z7901 Long term (current) use of anticoagulants: Secondary | ICD-10-CM | POA: Diagnosis not present

## 2024-11-30 DIAGNOSIS — I129 Hypertensive chronic kidney disease with stage 1 through stage 4 chronic kidney disease, or unspecified chronic kidney disease: Secondary | ICD-10-CM | POA: Insufficient documentation

## 2024-11-30 DIAGNOSIS — N1831 Chronic kidney disease, stage 3a: Secondary | ICD-10-CM | POA: Diagnosis not present

## 2024-11-30 DIAGNOSIS — Z87891 Personal history of nicotine dependence: Secondary | ICD-10-CM | POA: Diagnosis not present

## 2024-11-30 DIAGNOSIS — R079 Chest pain, unspecified: Secondary | ICD-10-CM | POA: Diagnosis present

## 2024-11-30 DIAGNOSIS — J479 Bronchiectasis, uncomplicated: Secondary | ICD-10-CM | POA: Diagnosis not present

## 2024-11-30 DIAGNOSIS — I480 Paroxysmal atrial fibrillation: Secondary | ICD-10-CM | POA: Diagnosis not present

## 2024-11-30 DIAGNOSIS — J9601 Acute respiratory failure with hypoxia: Secondary | ICD-10-CM | POA: Diagnosis present

## 2024-11-30 DIAGNOSIS — E875 Hyperkalemia: Secondary | ICD-10-CM | POA: Diagnosis present

## 2024-11-30 DIAGNOSIS — I2510 Atherosclerotic heart disease of native coronary artery without angina pectoris: Secondary | ICD-10-CM | POA: Diagnosis present

## 2024-11-30 DIAGNOSIS — I10 Essential (primary) hypertension: Secondary | ICD-10-CM | POA: Diagnosis present

## 2024-11-30 LAB — CBC WITH DIFFERENTIAL/PLATELET
Abs Immature Granulocytes: 0.01 K/uL (ref 0.00–0.07)
Basophils Absolute: 0.1 K/uL (ref 0.0–0.1)
Basophils Relative: 1 %
Eosinophils Absolute: 0.1 K/uL (ref 0.0–0.5)
Eosinophils Relative: 1 %
HCT: 27.9 % — ABNORMAL LOW (ref 39.0–52.0)
Hemoglobin: 8.9 g/dL — ABNORMAL LOW (ref 13.0–17.0)
Immature Granulocytes: 0 %
Lymphocytes Relative: 24 %
Lymphs Abs: 1.7 K/uL (ref 0.7–4.0)
MCH: 31.2 pg (ref 26.0–34.0)
MCHC: 31.9 g/dL (ref 30.0–36.0)
MCV: 97.9 fL (ref 80.0–100.0)
Monocytes Absolute: 1.2 K/uL — ABNORMAL HIGH (ref 0.1–1.0)
Monocytes Relative: 17 %
Neutro Abs: 4 K/uL (ref 1.7–7.7)
Neutrophils Relative %: 57 %
Platelets: 307 K/uL (ref 150–400)
RBC: 2.85 MIL/uL — ABNORMAL LOW (ref 4.22–5.81)
RDW: 26.3 % — ABNORMAL HIGH (ref 11.5–15.5)
WBC: 7.1 K/uL (ref 4.0–10.5)
nRBC: 0 % (ref 0.0–0.2)

## 2024-11-30 LAB — RESP PANEL BY RT-PCR (RSV, FLU A&B, COVID)  RVPGX2
Influenza A by PCR: NEGATIVE
Influenza B by PCR: NEGATIVE
Resp Syncytial Virus by PCR: NEGATIVE
SARS Coronavirus 2 by RT PCR: NEGATIVE

## 2024-11-30 LAB — COMPREHENSIVE METABOLIC PANEL WITH GFR
ALT: 16 U/L (ref 0–44)
AST: 19 U/L (ref 15–41)
Albumin: 4.3 g/dL (ref 3.5–5.0)
Alkaline Phosphatase: 84 U/L (ref 38–126)
Anion gap: 9 (ref 5–15)
BUN: 19 mg/dL (ref 8–23)
CO2: 24 mmol/L (ref 22–32)
Calcium: 9.5 mg/dL (ref 8.9–10.3)
Chloride: 110 mmol/L (ref 98–111)
Creatinine, Ser: 1.18 mg/dL (ref 0.61–1.24)
GFR, Estimated: >60 mL/min
Glucose, Bld: 85 mg/dL (ref 70–99)
Potassium: 5.4 mmol/L — ABNORMAL HIGH (ref 3.5–5.1)
Sodium: 142 mmol/L (ref 135–145)
Total Bilirubin: 1.3 mg/dL — ABNORMAL HIGH (ref 0.0–1.2)
Total Protein: 7.3 g/dL (ref 6.5–8.1)

## 2024-11-30 LAB — FERRITIN: Ferritin: 1069 ng/mL — ABNORMAL HIGH (ref 24–336)

## 2024-11-30 LAB — BASIC METABOLIC PANEL WITH GFR
Anion gap: 9 (ref 5–15)
BUN: 17 mg/dL (ref 8–23)
CO2: 22 mmol/L (ref 22–32)
Calcium: 8.8 mg/dL — ABNORMAL LOW (ref 8.9–10.3)
Chloride: 110 mmol/L (ref 98–111)
Creatinine, Ser: 1.13 mg/dL (ref 0.61–1.24)
GFR, Estimated: >60 mL/min
Glucose, Bld: 102 mg/dL — ABNORMAL HIGH (ref 70–99)
Potassium: 4.7 mmol/L (ref 3.5–5.1)
Sodium: 141 mmol/L (ref 135–145)

## 2024-11-30 LAB — PHOSPHORUS: Phosphorus: 2.5 mg/dL (ref 2.5–4.6)

## 2024-11-30 LAB — RETICULOCYTES
Immature Retic Fract: 12.2 % (ref 2.3–15.9)
RBC.: 2.83 MIL/uL — ABNORMAL LOW (ref 4.22–5.81)
Retic Count, Absolute: 16.4 K/uL — ABNORMAL LOW (ref 19.0–186.0)
Retic Ct Pct: 0.6 % (ref 0.4–3.1)

## 2024-11-30 LAB — PRO BRAIN NATRIURETIC PEPTIDE: Pro Brain Natriuretic Peptide: 546 pg/mL — ABNORMAL HIGH

## 2024-11-30 LAB — TROPONIN T, HIGH SENSITIVITY
Troponin T High Sensitivity: 36 ng/L — ABNORMAL HIGH (ref 0–19)
Troponin T High Sensitivity: 33 ng/L — ABNORMAL HIGH (ref 0–19)

## 2024-11-30 LAB — IRON AND TIBC
Iron: 43 ug/dL — ABNORMAL LOW (ref 45–182)
Saturation Ratios: 24 % (ref 17.9–39.5)
TIBC: 178 ug/dL — ABNORMAL LOW (ref 250–450)
UIBC: 134 ug/dL

## 2024-11-30 LAB — MAGNESIUM: Magnesium: 1.9 mg/dL (ref 1.7–2.4)

## 2024-11-30 LAB — PROCALCITONIN: Procalcitonin: <0.1 ng/mL

## 2024-11-30 LAB — CK: Total CK: <10 U/L — ABNORMAL LOW (ref 49–397)

## 2024-11-30 MED ORDER — FENTANYL CITRATE (PF) 50 MCG/ML IJ SOSY
25.0000 ug | PREFILLED_SYRINGE | INTRAMUSCULAR | Status: DC | PRN
  Administered 2024-12-01 (×2): 25 ug via INTRAVENOUS
  Filled 2024-11-30 (×2): qty 1

## 2024-11-30 MED ORDER — ACETAMINOPHEN 325 MG PO TABS: 650.0000 mg | ORAL_TABLET | Freq: Four times a day (QID) | ORAL | Status: DC | PRN

## 2024-11-30 MED ORDER — MORPHINE SULFATE (PF) 4 MG/ML IV SOLN
4.0000 mg | Freq: Once | INTRAVENOUS | Status: AC
  Administered 2024-11-30: 4 mg via INTRAVENOUS
  Filled 2024-11-30: qty 1

## 2024-11-30 MED ORDER — IOHEXOL 350 MG/ML SOLN
75.0000 mL | Freq: Once | INTRAVENOUS | Status: AC | PRN
  Administered 2024-11-30: 75 mL via INTRAVENOUS

## 2024-11-30 MED ORDER — SODIUM CHLORIDE 0.9 % IV SOLN: 2.0000 g | INTRAVENOUS | Status: DC

## 2024-11-30 MED ORDER — ALBUTEROL SULFATE (2.5 MG/3ML) 0.083% IN NEBU: 2.5000 mg | INHALATION_SOLUTION | RESPIRATORY_TRACT | Status: DC | PRN

## 2024-11-30 MED ORDER — ACETAMINOPHEN 650 MG RE SUPP: 650.0000 mg | Freq: Four times a day (QID) | RECTAL | Status: DC | PRN

## 2024-11-30 MED ORDER — OXYCODONE HCL 5 MG PO TABS
5.0000 mg | ORAL_TABLET | Freq: Once | ORAL | Status: DC
  Filled 2024-11-30: qty 1

## 2024-11-30 MED ORDER — SODIUM CHLORIDE 0.9 % IV SOLN
1.0000 g | Freq: Once | INTRAVENOUS | Status: AC
  Administered 2024-11-30: 1 g via INTRAVENOUS
  Filled 2024-11-30: qty 10

## 2024-11-30 MED ORDER — SODIUM CHLORIDE 0.9 % IV SOLN
500.0000 mg | INTRAVENOUS | Status: DC
  Filled 2024-11-30: qty 5

## 2024-11-30 MED ORDER — SODIUM CHLORIDE 0.9 % IV SOLN: INTRAVENOUS | Status: DC

## 2024-11-30 MED ORDER — METOCLOPRAMIDE HCL 5 MG/ML IJ SOLN
10.0000 mg | Freq: Once | INTRAMUSCULAR | Status: AC
  Administered 2024-11-30: 10 mg via INTRAVENOUS
  Filled 2024-11-30: qty 2

## 2024-11-30 MED ORDER — GUAIFENESIN ER 600 MG PO TB12
600.0000 mg | ORAL_TABLET | Freq: Two times a day (BID) | ORAL | Status: DC
  Administered 2024-11-30: 600 mg via ORAL
  Filled 2024-11-30 (×2): qty 1

## 2024-11-30 MED ORDER — AZITHROMYCIN 250 MG PO TABS
500.0000 mg | ORAL_TABLET | Freq: Once | ORAL | Status: AC
  Administered 2024-11-30: 500 mg via ORAL
  Filled 2024-11-30: qty 2

## 2024-11-30 NOTE — ED Notes (Signed)
 Patient is being discharged from the Urgent Care and sent to the Emergency Department via POV . Per Legrand Angle PA, patient is in need of higher level of care due to chest pain and sob. Patient is aware and verbalizes understanding of plan of care.  Vitals:   11/30/24 1402  BP: 108/63  Pulse: 88  Resp: (!) 22  Temp: 97.7 F (36.5 C)  SpO2: 98%

## 2024-11-30 NOTE — Assessment & Plan Note (Addendum)
 Albuterol  as needed  Could be contributing to hypoxia May benefit from pulmonology follow-up as an outpatient

## 2024-11-30 NOTE — Assessment & Plan Note (Signed)
this patient has acute respiratory failure with Hypoxia and   as documented by the presence of following: O2 saturatio< 90% on RA   Likely due to:   Pneumonia,   Provide O2 therapy and titrate as needed  Continuous pulse ox   check Pulse ox with ambulation prior to discharge   may need  TC consult for home O2 set up    flutter valve ordered  

## 2024-11-30 NOTE — Assessment & Plan Note (Signed)
Rehydrate and recheck 

## 2024-11-30 NOTE — ED Notes (Signed)
 Pt provided with a sprite and a cup of ice water.

## 2024-11-30 NOTE — Assessment & Plan Note (Signed)
 Cycle ce Possibly in the setting of CAP

## 2024-11-30 NOTE — ED Triage Notes (Signed)
 Pt was recently hospitalized for GI bleed and has come off of Eliquis . Pt has chest heaviness and shortness of breath

## 2024-11-30 NOTE — Subjective & Objective (Signed)
 Reports chest pain and shortness of breath that started today  History of A-fib has been off of Eliquis  secondary to GI bleed Went to urgent care today complaining of chest pain shortness of breath started today suddenly but was feeling somewhat unwell even yesterday No cough or fevers no chills no bodyaches Recent admission for pneumonia Reports chest pain is tightness   that does not radiate no nausea no vomiting History of pericarditis in 2024 also known history of CAD  chest x-ray done at urgent care nonacute He was sent to emergency department for further workup  Troponin noted to be mildly elevated 36 CTA was done and showed no PE but did show right lower lobe pneumonia of note patient had right lower lobe pneumonia also on previous admission Patient noted to be hypoxic down to 88% with ambulation

## 2024-11-30 NOTE — Assessment & Plan Note (Signed)
 Eliquis  on hold secondary to history of GI bleed Metoprolol  on hold secondary to history of hypotension

## 2024-11-30 NOTE — ED Triage Notes (Signed)
 Pt c/o  chest pain and sob that began today.  He was taken off eliqus recently due to GI bleed

## 2024-11-30 NOTE — Assessment & Plan Note (Signed)
-  chronic avoid nephrotoxic medications such as NSAIDs, Vanco Zosyn combo,  avoid hypotension, continue to follow renal function

## 2024-11-30 NOTE — Assessment & Plan Note (Signed)
 S/p pacemaker

## 2024-11-30 NOTE — ED Provider Notes (Signed)
 Consulted hospitalist, Dr. Doutova for admission for pneumonia with hypoxia.  She is agreeable to admission at this time.   Francis Ileana SAILOR, PA-C 11/30/24 2050    Francesca Elsie CROME, MD 11/30/24 [phone]

## 2024-11-30 NOTE — Assessment & Plan Note (Addendum)
" - -  Patient presenting with  productive cough,   hypoxia  , and infiltrate in   lower lobe on chest CT community-acquired pneumonia.         will admit for treatment of CAP will start on appropriate antibiotic coverage. - Rocephin /azithromycin    Obtain:  sputum cultures,                  Obtain respiratory panel                    influenza serologies negative                  COVID PCR negative                  blood cultures and sputum cultures ordered                   strep pneumo UA antigen,                   check for Legionella antigen.                Provide oxygen  as needed.  10:16 PM Procalcitonin unremarkable patient remains afebrile will consider DC antibiotics in the morning  "

## 2024-11-30 NOTE — ED Provider Notes (Signed)
 " [place] EMERGENCY DEPARTMENT AT [facility] Provider Note   CSN: [id number] Arrival date & time: 11/30/24  1532     Patient presents with: Chest Pain and Shortness of Breath   Todd Salazar is a 88 y.o. male with history of A-fib on Eliquis , hypertension, CAD presents with chest pain that started last night.  Is centrally located, described as pleuritic, sharp and heavy. Associated with cough.  Pain is constant but worse with exertion.  Was admitted for GI bleed last week as Eliquis  was held since Monday.  Denies any recent dark or bloody stools.  Seen at urgent care and referred here for further evaluation.    Chest Pain Associated symptoms: shortness of breath   Shortness of Breath Associated symptoms: chest pain        Prior to Admission medications  Medication Sig Start Date End Date Taking? Authorizing Provider  ELIQUIS  5 MG TABS tablet TAKE 1 TABLET BY MOUTH 2 TIMES A DAY Patient not taking: Reported on 11/30/2024 10/14/24   Inocencio Soyla Lunger, MD  ferrous sulfate  325 (65 FE) MG tablet Take 325 mg by mouth daily with breakfast.    [provider]  metoprolol  tartrate (LOPRESSOR ) 25 MG tablet Take 1 tablet (25 mg total) by mouth daily as needed (PALPITATIONS LSTING LONGER THATN 30 MINUTES). 09/18/24   Ursuy, Renee Lynn, PA-C  nitroGLYCERIN  (NITROSTAT ) 0.4 MG SL tablet Place 1 tablet (0.4 mg total) under the tongue every 5 (five) minutes as needed for chest pain. 09/18/24   Leverne Charlies Helling, PA-C    Allergies: Ciprofloxacin, Tramadol, Duloxetine, Gabapentin , Hydrocodone-acetaminophen , Nisoldipine, Benadryl [diphenhydramine], Methocarbamol, Ondansetron  hcl, and Sulfa antibiotics    Review of Systems  Respiratory:  Positive for shortness of breath.   Cardiovascular:  Positive for chest pain.    Updated Vital Signs BP 119/64   Pulse 86   Temp 98.2 F (36.8 C)   Resp 17   SpO2 97%   Physical Exam Vitals and nursing note reviewed.   Constitutional:      General: He is not in acute distress.    Appearance: He is well-developed.  HENT:     Head: Normocephalic and atraumatic.  Eyes:     Conjunctiva/sclera: Conjunctivae normal.  Cardiovascular:     Rate and Rhythm: Normal rate and regular rhythm.     Heart sounds: No murmur heard. Pulmonary:     Effort: Pulmonary effort is normal. No respiratory distress.     Breath sounds: Normal breath sounds.  Abdominal:     Palpations: Abdomen is soft.     Tenderness: There is no abdominal tenderness.  Musculoskeletal:        General: No swelling.     Cervical back: Neck supple.  Skin:    General: Skin is warm and dry.     Capillary Refill: Capillary refill takes less than 2 seconds.  Neurological:     Mental Status: He is alert.  Psychiatric:        Mood and Affect: Mood normal.     (all labs ordered are listed, but only abnormal results are displayed) Labs Reviewed  PRO BRAIN NATRIURETIC PEPTIDE - Abnormal; Notable for the following components:      Result Value   Pro Brain Natriuretic Peptide 546.0 (*)    All other components within normal limits  COMPREHENSIVE METABOLIC PANEL WITH GFR - Abnormal; Notable for the following components:   Potassium 5.4 (*)    Total Bilirubin 1.3 (*)    All  other components within normal limits  CBC WITH DIFFERENTIAL/PLATELET - Abnormal; Notable for the following components:   RBC 2.85 (*)    Hemoglobin 8.9 (*)    HCT 27.9 (*)    RDW 26.3 (*)    All other components within normal limits  TROPONIN T, HIGH SENSITIVITY - Abnormal; Notable for the following components:   Troponin T High Sensitivity 36 (*)    All other components within normal limits  RESP PANEL BY RT-PCR (RSV, FLU A&B, COVID)  [id number]  TROPONIN T    EKG: None  Radiology: CT Angio Chest PE W and/or Wo Contrast Result Date: 11/30/2024 CLINICAL DATA:  Provided history: Pulmonary embolism (PE) suspected, low to intermediate prob, neg D-dimer 88 year old with  shortness of breath. EXAM: CT ANGIOGRAPHY CHEST WITH CONTRAST TECHNIQUE: Multidetector CT imaging of the chest was performed using the standard protocol during bolus administration of intravenous contrast. Multiplanar CT image reconstructions and MIPs were obtained to evaluate the vascular anatomy. RADIATION DOSE REDUCTION: This exam was performed according to the departmental dose-optimization program which includes automated exposure control, adjustment of the mA and/or kV according to patient size and/or use of iterative reconstruction technique. CONTRAST:  75mL OMNIPAQUE  IOHEXOL  350 MG/ML SOLN COMPARISON:  Radiograph earlier today.  Chest CTA 07/10/2023 FINDINGS: Cardiovascular: There are no filling defects within the pulmonary arteries to suggest pulmonary embolus. Dilated main pulmonary artery at 3.6 cm. The heart is enlarged. Left-sided pacemaker in place. No pericardial effusion. Aortic atherosclerosis without aneurysm or acute aortic findings. The descending aorta is tortuous. Mediastinum/Nodes: Shotty mediastinal and right hilar lymph nodes, not enlarged by size criteria. Patulous upper esophagus. No esophageal wall thickening. No visible thyroid  nodule. Lungs/Pleura: Small to moderate size subpleural consolidation within the posterior right lower lobe. The consolidation previously seen in the anterior right lower lobe has resolved. Mild patchy ground-glass opacity in the dependent left lower lobe. Biapical pleuroparenchymal scarring. Please note breathing motion artifact limits detailed parenchymal assessment. The previous right middle lobe nodule is grossly stable, primarily motion obscured, series 12 image 98. No pleural effusion. Trachea and central airways are clear. Upper Abdomen: No acute upper abdominal findings. Punctate nonobstructing left renal calculi. Musculoskeletal: Degenerative change in the spine and shoulders. Review of the MIP images confirms the above findings. IMPRESSION: 1. No  pulmonary embolus. 2. Small to moderate size subpleural consolidation in the posterior right lower lobe, suspicious for pneumonia. 3. Mild patchy ground-glass opacity in the dependent left lower lobe, likely atelectasis. 4. Dilated main pulmonary artery, suggesting pulmonary arterial hypertension. Chronic cardiomegaly. Aortic Atherosclerosis (ICD10-I70.0). Electronically Signed   By: Andrea Gasman M.D.   On: 11/30/2024 18:54   DG Chest 2 View Result Date: 11/30/2024 EXAM: 2 VIEW(S) XRAY OF THE CHEST 11/30/2024 02:42:33 PM COMPARISON: 07/10/2023 CLINICAL HISTORY: Shortness of breath FINDINGS: LUNGS AND PLEURA: Biapical pleural thickening. No pulmonary edema. No pleural effusion. No pneumothorax. Streaky opacities in the right lung base, likely atelectasis no scarring. HEART AND MEDIASTINUM: Well positioned left chest pacemaker with leads terminating in the right atrium and right ventricle. BONES AND SOFT TISSUES: Partially visualized posterior fusion hardware in the lumbar spine. Multilevel thoracolumbar osteophytosis. IMPRESSION: 1. No acute cardiopulmonary abnormality. Electronically signed by: Rogelia Myers MD 11/30/2024 03:26 PM EST RP Workstation: [id number]     Procedures   Medications Ordered in the ED  cefTRIAXone  (ROCEPHIN ) 1 g in sodium chloride  0.9 % 100 mL IVPB (has no administration in time range)  azithromycin  (ZITHROMAX ) tablet 500 mg (has no administration in time  range)  morphine  (PF) 4 MG/ML injection 4 mg (4 mg Intravenous Given 11/30/24 1708)  metoCLOPramide  (REGLAN ) injection 10 mg (10 mg Intravenous Given 11/30/24 1707)  iohexol  (OMNIPAQUE ) 350 MG/ML injection 75 mL (75 mLs Intravenous Contrast Given 11/30/24 1757)    Clinical Course as of 11/30/24 2001  Sat Nov 30, 2024  1634 Patient with history of A-fib on Eliquis , recent GI bleed presents with complaints of chest pain and shortness of breath that started last night.  Upon arrival patient is hemodynamically stable he  is chronically ill-appearing with increased work of breathing.  His lung sounds are clear.  Will obtain cardiac workup and PE study given recent hold on Eliquis . [JT]  1753 ED EKG Sinus rhythm without ischemic changes [JT]  1754 Resp panel by RT-PCR (RSV, Flu A&B, Covid) Anterior Nasal Swab Negative [JT]  1754 Troponin T, High Sensitivity(!) Elevated 36 will try [JT]  1754 Comprehensive metabolic panel(!) Mild hyperkalemia 5.4 [JT]  1754 Pro Brain natriuretic peptide(!) Elevated to 546 [JT]  1929 CT Angio Chest PE W and/or Wo Contrast Concerning for right lower lobe pneumonia  During his recent admission patient was noted to have a right lower lobe pneumonia [JT]  1946 CBC with Differential(!) No leukocytosis, hemoglobin stable 8.9 [JT]  2000 Unsuccessful ambulation test, dropped on 88%.  Will pursue admission. [JT]    Clinical Course User Index [JT] Donnajean Lynwood DEL, PA-C                                 Medical Decision Making Amount and/or Complexity of Data Reviewed Labs: ordered. Decision-making details documented in ED Course. Radiology: ordered. Decision-making details documented in ED Course. ECG/medicine tests:  Decision-making details documented in ED Course.  Risk Prescription drug management. Decision regarding hospitalization.   This patient presents to the ED with chief complaint(s) of chest pain.  The complaint involves an extensive differential diagnosis and also carries with it a high risk of complications and morbidity.   Pertinent past medical history as listed in HPI  The differential diagnosis includes  ACS, PE, aortic dissection, pneumothorax, pneumonia, URI Additional history obtained: Additional history obtained from family Records reviewed Care Everywhere/External Records  Disposition:   Signout given to Kayla Keith, PA-C.  Please see her note for the manger of the visit.  Anticipate admission for new oxygen  requirement secondary to pneumonia  social Determinants of Health:   none  This note was dictated with voice recognition software.  Despite best efforts at proofreading, errors may have occurred which can change the documentation meaning.       Final diagnoses:  Pneumonia of right lower lobe due to infectious organism    ED Discharge Orders     None          Donnajean Lynwood DEL DEVONNA 11/30/24 RAYNOLD Patsey Lot, MD 11/30/24 2310  "

## 2024-11-30 NOTE — ED Provider Notes (Signed)
 [facility]    CSN: [id number] Arrival date & time: 11/30/24  1337      History   Chief Complaint Chief Complaint  Patient presents with   Shortness of Breath   Chest Pain    HPI Todd Salazar is a 88 y.o. male.  Patient with significant medical history including atrial fibrillation, hypertension, coronary artery disease, CHF, previously anticoagulated, GI bleed presents to the urgent care today with concerns of shortness of breath and chest pain.  Reports onset of symptoms today that began rather suddenly.  Initially was feeling unwell yesterday but no reported cough, fever, chills or bodyaches.  Was recently hospitalized and treated for pneumonia that reportedly has resolved.  He continues to endorse that he is not having a nonproductive cough.  When discussing with family, they report they have not noticed any significant coughing today.  He continues to endorse tightness in his central chest without radiation towards the right or left side.  No reported nausea, vomiting, or diaphoresis today.  Had previously been on a blood thinner but was discontinued after he developed a GI bleed.  He has not restarted his anticoagulation.   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   Pacemaker 07/01/2023   Atrial flutter with rapid ventricular response (HCC) 05/30/2022   Intractable pain 04/16/2022   Vomiting 04/16/2022   CKD-3A 04/16/2022   Acute right hip pain 04/16/2022   Normocytic anemia 04/16/2022   Hypercoagulable state due to persistent atrial fibrillation (HCC) 08/27/2021    Persistent atrial fibrillation (HCC) 08/17/2021   S/P lumbar fusion 11/25/2019   Carcinoma of prostate (HCC) 09/07/2019   Lumbar radiculopathy 09/07/2019   Degeneration of lumbar intervertebral disc 09/07/2019   History of total replacement of both hip joints 08/01/2019   Iliotibial band syndrome of right side 07/27/2019   Macrocytic anemia 07/26/2019   Hypotension 07/26/2019   Bronchiectasis without complication (HCC) 02/22/2019   DOE (dyspnea on exertion) 10/19/2018   Lung nodule < 6cm on CT 10/09/2018   Occipital neuritis 08/29/2016   Bilateral hearing loss 06/22/2016   Post herpetic neuralgia 02/02/2016   Current use of long term anticoagulation 12/18/2015   Calcification of spleen 09/13/2014   Diverticulosis 09/13/2014   Bilateral renal cysts 09/13/2014   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   Hemangioma of liver 09/13/2014   Male erectile dysfunction, unspecified 09/13/2014   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   Paraphimosis 09/13/2014   Polyp of colon 09/13/2014   Restless legs syndrome 09/13/2014   Spondylosis without myelopathy or radiculopathy, cervical region 09/13/2014   Urethral stricture 09/13/2014   Spondylosis without myelopathy or radiculopathy, lumbosacral region 09/13/2014   Anxiety 09/13/2014   Chest pain at rest 12/11/2011    Class: Acute   CAD (coronary artery disease), native coronary artery 12/11/2011    Class: Chronic   HTN (hypertension) 12/11/2011   PAF (paroxysmal atrial fibrillation) (HCC) 12/11/2011    Past Surgical History:  Procedure Laterality Date   ATRIAL FIBRILLATION ABLATION N/A 10/29/2021   Procedure: ATRIAL FIBRILLATION ABLATION;  Surgeon:  Inocencio Soyla Lunger, MD;  Location: [facility] INVASIVE CV LAB;  Service: Cardiovascular;  Laterality: N/A;   ATRIAL FIBRILLATION ABLATION N/A 06/29/2023   Procedure: ATRIAL FIBRILLATION ABLATION;  Surgeon: Inocencio Soyla Lunger, MD;  Location: [facility] INVASIVE CV LAB;  Service: Cardiovascular;   Laterality: N/A;   Bone spur     CARDIOVERSION N/A 06/01/2022   Procedure: CARDIOVERSION;  Surgeon: Kate Lonni CROME, MD;  Location: [facility] ENDOSCOPY;  Service: Cardiovascular;  Laterality: N/A;   CHOLECYSTECTOMY     HERNIA REPAIR     JOINT REPLACEMENT     kidney stones     LAMINECTOMY WITH POSTERIOR LATERAL ARTHRODESIS LEVEL 1 Right 11/25/2019   Procedure: Laminectomy and Foraminotomy  Lumbar Four-Lumbar Five - right, with instrumented fusion;  Surgeon: Joshua Alm RAMAN, MD;  Location: [facility] OR;  Service: Neurosurgery;  Laterality: Right;  Laminectomy and Foraminotomy  Lumbar Four-Lumbar Five - right, with instrumented fusion   PROSTATE SURGERY     TEE WITHOUT CARDIOVERSION N/A 06/01/2022   Procedure: TRANSESOPHAGEAL ECHOCARDIOGRAM (TEE);  Surgeon: Kate Lonni CROME, MD;  Location: [facility] ENDOSCOPY;  Service: Cardiovascular;  Laterality: N/A;       Home Medications    Prior to Admission medications  Medication Sig Start Date End Date Taking? Authorizing Provider  ELIQUIS  5 MG TABS tablet TAKE 1 TABLET BY MOUTH 2 TIMES A DAY Patient not taking: Reported on 11/30/2024 10/14/24   Inocencio Soyla Lunger, MD  ferrous sulfate  325 (65 FE) MG tablet 325 mg by oral route.    [provider]  metoprolol  tartrate (LOPRESSOR ) 25 MG tablet Take 1 tablet (25 mg total) by mouth daily as needed (PALPITATIONS LSTING LONGER THATN 30 MINUTES). Patient not taking: Reported on 10/18/2024 09/18/24   Leverne Charlies Helling, PA-C  nitroGLYCERIN  (NITROSTAT ) 0.4 MG SL tablet Place 1 tablet (0.4 mg total) under the tongue every 5 (five) minutes as needed for chest pain. 09/18/24   Leverne Charlies Helling, PA-C    Family History Family History  Problem Relation Age of Onset   Hypertension Father    CAD Neg Hx    Heart failure Neg Hx    Stroke Neg Hx     Social History Social History[1]   Allergies   Ciprofloxacin, Tramadol, Duloxetine, Hydrocodone-acetaminophen , Nisoldipine, Benadryl [diphenhydramine],  Methocarbamol, Ondansetron  hcl, and Sulfa antibiotics   Review of Systems Review of Systems  Respiratory:  Positive for shortness of breath.   Cardiovascular:  Positive for chest pain.  All other systems reviewed and are negative.    Physical Exam Triage Vital Signs ED Triage Vitals [11/30/24 1402]  Encounter Vitals Group     BP 108/63     Girls Systolic BP Percentile      Girls Diastolic BP Percentile      Boys Systolic BP Percentile      Boys Diastolic BP Percentile      Pulse Rate 88     Resp (!) 22     Temp 97.7 F (36.5 C)     Temp Source Oral     SpO2 98 %     Weight      Height      Head Circumference      Peak Flow      Pain Score 10     Pain Loc      Pain Education      Exclude from Growth Chart    No data found.  Updated Vital Signs BP 108/63 (BP Location: Right Arm)   Pulse 88  Temp 97.7 F (36.5 C) (Oral)   Resp (!) 22   SpO2 98%   Visual Acuity Right Eye Distance:   Left Eye Distance:   Bilateral Distance:    Right Eye Near:   Left Eye Near:    Bilateral Near:     Physical Exam Vitals and nursing note reviewed.  Constitutional:      General: He is not in acute distress.    Appearance: He is well-developed.  HENT:     Head: Normocephalic and atraumatic.  Eyes:     Conjunctiva/sclera: Conjunctivae normal.  Cardiovascular:     Rate and Rhythm: Normal rate and regular rhythm.     Heart sounds: No murmur heard. Pulmonary:     Effort: Pulmonary effort is normal. No respiratory distress.     Breath sounds: Normal breath sounds. No decreased breath sounds, wheezing, rhonchi or rales.  Chest:     Chest wall: No mass or tenderness.  Abdominal:     Palpations: Abdomen is soft.     Tenderness: There is no abdominal tenderness.  Musculoskeletal:        General: No swelling.     Cervical back: Neck supple.  Skin:    General: Skin is warm and dry.     Capillary Refill: Capillary refill takes less than 2 seconds.  Neurological:     Mental  Status: He is alert.  Psychiatric:        Mood and Affect: Mood normal.      UC Treatments / Results  Labs (all labs ordered are listed, but only abnormal results are displayed) Labs Reviewed - No data to display  EKG   Radiology No results found.  Procedures Procedures (including critical care time)  Medications Ordered in UC Medications - No data to display  Initial Impression / Assessment and Plan / UC Course  I have reviewed the triage vital signs and the nursing notes.  Pertinent labs & imaging results that were available during my care of the patient were reviewed by me and considered in my medical decision making (see chart for details).     This patient presents to the UC for concern of shortness of breath, chest pain.  Differential diagnosis includes bronchitis, pneumonia, viral URI, PE, CHF    Additional history obtained:  Additional history obtained from chart review    Imaging Studies ordered:  I ordered imaging studies including chest x-ray I independently visualized and interpreted imaging which showed pending formal radiology read but no clear evidence of focal consolidation to suggest possible pneumonia and no visible pleural effusion. I agree with the radiologist interpretation    Problem List /UC course:  Patient with history significant for atrial fibrillation, GI bleed, hypertension, CAD, CHF presents the urgent care today with concerns of shortness of breath and chest tightness.  Reports symptoms started earlier today and denies any productive cough.  No reported fever, chills or bodyaches.  Denies any nausea, vomiting, or diaphoresis.  Reportedly was recently hospitalized for concerns of GI bleed with development of pneumonia.  Family states that he has finished his antibiotics and has been doing well but began to feel unwell this morning. On exam, patient is alert and oriented and behaving appropriately.  Vitals large unremarkable with only  borderline tachypnea.  No significant abnormal heart or lung sounds on my exam. EKG here shows sinus rhythm, no STEMI. No clear arrhyhtmia. Personal to potation of chest x-ray does not show any obvious findings of any focal consolidation to  suggest pneumonia.  No appreciable pleural effusion either.  Unclear cause of patient's shortness of breath but given his recent discontinuation of anticoagulants due to recent GI bleed, I am concerned for possible PE given sudden onset of shortness of breath and minimal cough.  Patient notably experienced difficulty with ambulation here requiring to sit down after several steps due to becoming short of breath.  I feel that he currently requires further evaluation at a higher level of care so he has been instructed to go to the nearest emergency department for evaluation of this shortness of breath with his daughter driving him there.   Social Determinants of Health:  None  Final Clinical Impressions(s) / UC Diagnoses   Final diagnoses:  Shortness of breath     Discharge Instructions      Please go to [facility] Emergency Department for further evaluation of your shortness of breath.  [address], [place], [place]     ED Prescriptions   None    PDMP not reviewed this encounter.     [1]  Social History Tobacco Use   Smoking status: Former    Current packs/day: 0.00    Types: Cigarettes    Quit date: 12/10/1996    Years since quitting: 27.9   Smokeless tobacco: Never   Tobacco comments:    Former smoker (08/27/2021)  Vaping Use   Vaping status: Never Used  Substance Use Topics   Alcohol use: No   Drug use: No     Laddie Naeem A, PA-C 11/30/24 1519  "

## 2024-11-30 NOTE — Assessment & Plan Note (Signed)
Allow permissive HTn  °

## 2024-11-30 NOTE — Assessment & Plan Note (Signed)
 Obtain anemia panel  Transfuse for Hg <7 , rapidly dropping or  if symptomatic

## 2024-11-30 NOTE — Discharge Instructions (Signed)
 Please go to [facility] Emergency Department for further evaluation of your shortness of breath.  [address] [place], [place], [place]

## 2024-11-30 NOTE — H&P (Signed)
 "    Todd Salazar FMW:[id number] DOB: [birth year] DOA: 11/30/2024     PCP: Patient, No Pcp Per   Outpatient Specialists:  CARDS: Dr.  Inocencio    Patient arrived to ER on 11/30/24 at 1532 Referred by Attending Francesca Elsie CROME, MD   Patient coming from:    home Lives alone,      Chief Complaint:   Chief Complaint  Patient presents with   Chest Pain   Shortness of Breath    HPI: Todd Salazar is a 88 y.o. male with medical history significant of anemia CAD A-fib, GI bleed, colon cancer, HLD, RLS, prostate cancer, IDA, restrictive lung disease, bronchiectasis, CKD stage III    Presented with pain shortness of breath Reports chest pain and shortness of breath that started today  History of A-fib has been off of Eliquis  secondary to GI bleed Went to urgent care today complaining of chest pain shortness of breath started today suddenly but was feeling somewhat unwell even yesterday No cough or fevers no chills no bodyaches Recent admission for pneumonia Reports chest pain is tightness   that does not radiate no nausea no vomiting History of pericarditis in 2024 also known history of CAD  chest x-ray done at urgent care nonacute He was sent to emergency department for further workup  Troponin noted to be mildly elevated 36 CTA was done and showed no PE but did show right lower lobe pneumonia of note patient had right lower lobe pneumonia also on previous admission Patient noted to be hypoxic down to 88% with ambulation    Has been off Eliquis  since Monday 6 days, no bleeding  Reports CP has resolved now it felt like an elephant siting on his chest. The CP seem to be non-positional, not worse or better with activity Taking a deep breath made it worse  Pain has been non stop ongoing since yesterday night  Denies any cough or fever at home  No sick contacts  Has been trying to do home PT twice a week for the past week  Denies significant ETOH intake   Does not smoke      Regarding pertinent Chronic problems:      HTN on has been off metoprol   last echo  Recent Results (from the past 56199 hours)  ECHOCARDIOGRAM COMPLETE   Collection Time: 10/11/24  8:11 AM  Result Value   Area-P 1/2 3.83   S' Lateral 3.20   MV M vel 4.31   MV Peak grad 74.3   Est EF 60 - 65%   Narrative      ECHOCARDIOGRAM REPORT       Patient Name:   Todd Salazar  Date of Exam: 10/11/2024 Medical Rec #:  [id number]     Height:       75.0 in Accession #:    [id number]    Weight:       191.0 lb Date of Birth:  [birth year]     BSA:          2.152 m Patient Age:    88 years      BP:           126/58 mmHg Patient Gender: M             HR:           78 bpm. Exam Location:  [address]  Procedure: 2D Echo, Cardiac Doppler, Color Doppler and 3D Echo (Both Spectral  and Color Flow Doppler were utilized during procedure).  Indications:    Pericardial effusion I31.3   History:        Patient has prior history of Echocardiogram examinations, most                 recent 07/12/2023. Atrial Fibrillation Ablation;                 Arrythmias:Atrial Fibrillation.   Sonographer:    Augustin Seals RDCS Referring Phys: [id number] RENEE LYNN URSUY  IMPRESSIONS    1. Left ventricular ejection fraction, by estimation, is 60 to 65%. Left ventricular ejection fraction by 3D volume is 60 %. The left ventricle has normal function. The left ventricle has no regional wall motion abnormalities. Left ventricular diastolic  parameters were normal.  2. Right ventricular systolic function is normal. The right ventricular size is mildly enlarged. There is mildly elevated pulmonary artery systolic pressure. The estimated right ventricular systolic pressure is 40.7 mmHg.  3. Right atrial size was moderately dilated.  4. The mitral valve is normal in structure. Trivial mitral valve regurgitation. No evidence of mitral stenosis.  5. The aortic valve is tricuspid. Aortic valve regurgitation is  trivial. Aortic valve sclerosis/calcification is present, without any evidence of aortic stenosis.  6. The inferior vena cava is normal in size with greater than 50% respiratory variability, suggesting right atrial pressure of 3 mmHg.  7. Trivial pericardial effusion is present. The pericardial effusion is circumferential.     There is no evidence of cardiac tamponade.           CAD  - followed by cardiology                - last cardiac cath         Bronchiectasis not seeing pulmonologist    A. Fib -   atrial fibrillation CHA2DS2 vas score   5    Not on anticoagulation secondary to  recurrent bleeding              CKD stage IIIa  baseline Cr1.3 CrCl cannot be calculated (Unknown ideal weight.).  Lab Results  Component Value Date   CREATININE 1.18 11/30/2024   CREATININE 1.19 09/19/2024   CREATININE 1.36 (H) 11/17/2023   Lab Results  Component Value Date   NA 142 11/30/2024   CL 110 11/30/2024   K 5.4 (H) 11/30/2024   CO2 24 11/30/2024   BUN 19 11/30/2024   CREATININE 1.18 11/30/2024   GFRNONAA >60 11/30/2024   CALCIUM  9.5 11/30/2024   PHOS 3.0 04/17/2022   ALBUMIN 4.3 11/30/2024   GLUCOSE 85 11/30/2024         Chronic anemia - baseline hg Hemoglobin & Hematocrit  Recent Labs    09/19/24 0945 11/30/24 1703  HGB 8.9* 8.9*   Iron/TIBC/Ferritin/ %Sat    Component Value Date/Time   IRON 69 04/16/2022 0850   TIBC 224 (L) 04/16/2022 0850   FERRITIN 632 (H) 04/16/2022 0850   IRONPCTSAT 31 04/16/2022 0850    Cancer: Colon cancer     While in ER: Clinical Course as of 11/30/24 2018  Sat Nov 30, 2024  1634 Patient with history of A-fib on Eliquis , recent GI bleed presents with complaints of chest pain and shortness of breath that started last night.  Upon arrival patient is hemodynamically stable he is chronically ill-appearing with increased work of breathing.  His lung sounds are clear.  Will obtain cardiac workup and PE study given recent hold  on Eliquis . [JT]   1753 ED EKG Sinus rhythm without ischemic changes [JT]  1754 Resp panel by RT-PCR (RSV, Flu A&B, Covid) Anterior Nasal Swab Negative [JT]  1754 Troponin T, High Sensitivity(!) Elevated 36 will try [JT]  1754 Comprehensive metabolic panel(!) Mild hyperkalemia 5.4 [JT]  1754 Pro Brain natriuretic peptide(!) Elevated to 546 [JT]  1929 CT Angio Chest PE W and/or Wo Contrast Concerning for right lower lobe pneumonia  During his recent admission patient was noted to have a right lower lobe pneumonia [JT]  1946 CBC with Differential(!) No leukocytosis, hemoglobin stable 8.9 [JT]  2000 Unsuccessful ambulation test, dropped on 88%.  Will pursue admission. [JT]    Clinical Course User Index [JT] Donnajean Lynwood DEL, PA-C     Lab Orders         Resp panel by RT-PCR (RSV, Flu A&B, Covid) Anterior Nasal Swab         Pro Brain natriuretic peptide         Comprehensive metabolic panel         CBC with Differential      CXR - acute cardiopulmonary abnormality.     CTA chest -   no PE, Small to moderate size subpleural consolidation in the posterior right lower lobe, suspicious for pneumonia. 3. Mild patchy ground-glass opacity in the dependent left lower lobe, likely atelectasis. 4. Dilated main pulmonary artery, suggesting pulmonary arterial hypertension. Chronic cardiomegaly.  Following Medications were ordered in ER: Medications  cefTRIAXone  (ROCEPHIN ) 1 g in sodium chloride  0.9 % 100 mL IVPB (1 g Intravenous New Bag/Given 11/30/24 2016)  morphine  (PF) 4 MG/ML injection 4 mg (4 mg Intravenous Given 11/30/24 1708)  metoCLOPramide  (REGLAN ) injection 10 mg (10 mg Intravenous Given 11/30/24 1707)  iohexol  (OMNIPAQUE ) 350 MG/ML injection 75 mL (75 mLs Intravenous Contrast Given 11/30/24 1757)  azithromycin  (ZITHROMAX ) tablet 500 mg (500 mg Oral Given 11/30/24 2013)        ED Triage Vitals  Encounter Vitals Group     BP 11/30/24 1542 110/74     Girls Systolic BP Percentile --       Girls Diastolic BP Percentile --      Boys Systolic BP Percentile --      Boys Diastolic BP Percentile --      Pulse Rate 11/30/24 1542 99     Resp 11/30/24 1542 18     Temp 11/30/24 1542 98.3 F (36.8 C)     Temp Source 11/30/24 1542 Oral     SpO2 11/30/24 1542 100 %     Weight --      Height --      Head Circumference --      Peak Flow --      Pain Score 11/30/24 1708 10     Pain Loc --      Pain Education --      Exclude from Growth Chart --   UFJK(75)@     _________________________________________ Significant initial  Findings: Abnormal Labs Reviewed  PRO BRAIN NATRIURETIC PEPTIDE - Abnormal; Notable for the following components:      Result Value   Pro Brain Natriuretic Peptide 546.0 (*)    All other components within normal limits  COMPREHENSIVE METABOLIC PANEL WITH GFR - Abnormal; Notable for the following components:   Potassium 5.4 (*)    Total Bilirubin 1.3 (*)    All other components within normal limits  CBC WITH DIFFERENTIAL/PLATELET - Abnormal; Notable for the following components:   RBC 2.85 (*)  Hemoglobin 8.9 (*)    HCT 27.9 (*)    RDW 26.3 (*)    All other components within normal limits  TROPONIN T, HIGH SENSITIVITY - Abnormal; Notable for the following components:   Troponin T High Sensitivity 36 (*)    All other components within normal limits      _________________________ Troponin 36 Cardiac Panel (last 3 results) No results for input(s): CKTOTAL, CKMB, TROPONINIHS, RELINDX in the last 72 hours.   ECG: Ordered Personally reviewed and interpreted by me showing: HR : 79 Rhythm: Sinus or ectopic atrial rhythm Prolonged PR interval Incomplete right bundle branch block QTC 472   The recent clinical data is shown below. Vitals:   11/30/24 1542 11/30/24 1900  BP: 110/74 119/64  Pulse: 99 86  Resp: 18 17  Temp: 98.3 F (36.8 C) 98.2 F (36.8 C)  TempSrc: Oral   SpO2: 100% 97%    WBC     Component Value Date/Time   WBC 7.1  11/30/2024 1703   LYMPHSABS PENDING 11/30/2024 1703   MONOABS PENDING 11/30/2024 1703   EOSABS PENDING 11/30/2024 1703   BASOSABS PENDING 11/30/2024 1703     Procalcitonin   Ordered      Results for orders placed or performed during the hospital encounter of 11/30/24  Resp panel by RT-PCR (RSV, Flu A&B, Covid) Anterior Nasal Swab     Status: None   Collection Time: 11/30/24  5:10 PM   Specimen: Anterior Nasal Swab  Result Value Ref Range Status   SARS Coronavirus 2 by RT PCR NEGATIVE NEGATIVE Final         Influenza A by PCR NEGATIVE NEGATIVE Final   Influenza B by PCR NEGATIVE NEGATIVE Final         Resp Syncytial Virus by PCR NEGATIVE NEGATIVE Final          ABX started Antibiotics Given (last 72 hours)     Date/Time Action Medication Dose Rate   11/30/24 2013 Given   azithromycin  (ZITHROMAX ) tablet 500 mg 500 mg    11/30/24 2016 New Bag/Given   cefTRIAXone  (ROCEPHIN ) 1 g in sodium chloride  0.9 % 100 mL IVPB 1 g 200 mL/hr        No results found for the last 90 days.  ______________________ Recent Labs  Lab 11/30/24 1703  NA 142  K 5.4*  CO2 24  GLUCOSE 85  BUN 19  CREATININE 1.18  CALCIUM  9.5    Cr   stable,   Lab Results  Component Value Date   CREATININE 1.18 11/30/2024   CREATININE 1.19 09/19/2024   CREATININE 1.36 (H) 11/17/2023    Recent Labs  Lab 11/30/24 1703  AST 19  ALT 16  ALKPHOS 84  BILITOT 1.3*  PROT 7.3  ALBUMIN 4.3   Lab Results  Component Value Date   CALCIUM  9.5 11/30/2024   PHOS 3.0 04/17/2022    Plt: Lab Results  Component Value Date   PLT 307 11/30/2024       Recent Labs  Lab 11/30/24 1703  WBC 7.1  NEUTROABS PENDING  HGB 8.9*  HCT 27.9*  MCV 97.9  PLT 307    HG/HCT  stable,      Component Value Date/Time   HGB 8.9 (L) 11/30/2024 1703   HGB 8.9 (L) 09/19/2024 0945   HCT 27.9 (L) 11/30/2024 1703   HCT 27.1 (L) 09/19/2024 0945   MCV 97.9 11/30/2024 1703   MCV 98 (H) 09/19/2024 0945  _______________________________________________ Hospitalist was called for admission for  Pneumonia of right lower lobe due to infectious organism     The following Work up has been ordered so far:  Orders Placed This Encounter  Procedures   Resp panel by RT-PCR (RSV, Flu A&B, Covid) Anterior Nasal Swab   CT Angio Chest PE W and/or Wo Contrast   Pro Brain natriuretic peptide   Comprehensive metabolic panel   CBC with Differential   ED Cardiac monitoring   Check Pulse Oximetry while ambulating   Consult to hospitalist   ED Pulse oximetry, continuous   ED EKG   Insert peripheral IV     OTHER Significant initial  Findings:  labs showing:     DM  labs:  HbA1C: No results for input(s): HGBA1C in the last 8760 hours.     CBG (last 3)  No results for input(s): GLUCAP in the last 72 hours.        Cultures:    Component Value Date/Time   SDES BLOOD BLOOD LEFT HAND 07/10/2023 2351   SPECREQUEST  07/10/2023 2351    BOTTLES DRAWN AEROBIC AND ANAEROBIC Blood Culture adequate volume   CULT  07/10/2023 2351    NO GROWTH 5 DAYS Performed at [facility], [address] [place], [place] [address]    REPTSTATUS 07/16/2023 FINAL 07/10/2023 2351     Radiological Exams on Admission: CT Angio Chest PE W and/or Wo Contrast Result Date: 11/30/2024 CLINICAL DATA:  Provided history: Pulmonary embolism (PE) suspected, low to intermediate prob, neg D-dimer 88 year old with shortness of breath. EXAM: CT ANGIOGRAPHY CHEST WITH CONTRAST TECHNIQUE: Multidetector CT imaging of the chest was performed using the standard protocol during bolus administration of intravenous contrast. Multiplanar CT image reconstructions and MIPs were obtained to evaluate the vascular anatomy. RADIATION DOSE REDUCTION: This exam was performed according to the departmental dose-optimization program which includes automated exposure control, adjustment of the mA and/or kV according to patient size and/or use  of iterative reconstruction technique. CONTRAST:  75mL OMNIPAQUE  IOHEXOL  350 MG/ML SOLN COMPARISON:  Radiograph earlier today.  Chest CTA 07/10/2023 FINDINGS: Cardiovascular: There are no filling defects within the pulmonary arteries to suggest pulmonary embolus. Dilated main pulmonary artery at 3.6 cm. The heart is enlarged. Left-sided pacemaker in place. No pericardial effusion. Aortic atherosclerosis without aneurysm or acute aortic findings. The descending aorta is tortuous. Mediastinum/Nodes: Shotty mediastinal and right hilar lymph nodes, not enlarged by size criteria. Patulous upper esophagus. No esophageal wall thickening. No visible thyroid  nodule. Lungs/Pleura: Small to moderate size subpleural consolidation within the posterior right lower lobe. The consolidation previously seen in the anterior right lower lobe has resolved. Mild patchy ground-glass opacity in the dependent left lower lobe. Biapical pleuroparenchymal scarring. Please note breathing motion artifact limits detailed parenchymal assessment. The previous right middle lobe nodule is grossly stable, primarily motion obscured, series 12 image 98. No pleural effusion. Trachea and central airways are clear. Upper Abdomen: No acute upper abdominal findings. Punctate nonobstructing left renal calculi. Musculoskeletal: Degenerative change in the spine and shoulders. Review of the MIP images confirms the above findings. IMPRESSION: 1. No pulmonary embolus. 2. Small to moderate size subpleural consolidation in the posterior right lower lobe, suspicious for pneumonia. 3. Mild patchy ground-glass opacity in the dependent left lower lobe, likely atelectasis. 4. Dilated main pulmonary artery, suggesting pulmonary arterial hypertension. Chronic cardiomegaly. Aortic Atherosclerosis (ICD10-I70.0). Electronically Signed   By: Andrea Gasman M.D.   On: 11/30/2024 18:54   DG Chest 2 View  Result Date: 11/30/2024 EXAM: 2 VIEW(S) XRAY OF THE CHEST 11/30/2024  02:42:33 PM COMPARISON: 07/10/2023 CLINICAL HISTORY: Shortness of breath FINDINGS: LUNGS AND PLEURA: Biapical pleural thickening. No pulmonary edema. No pleural effusion. No pneumothorax. Streaky opacities in the right lung base, likely atelectasis no scarring. HEART AND MEDIASTINUM: Well positioned left chest pacemaker with leads terminating in the right atrium and right ventricle. BONES AND SOFT TISSUES: Partially visualized posterior fusion hardware in the lumbar spine. Multilevel thoracolumbar osteophytosis. IMPRESSION: 1. No acute cardiopulmonary abnormality. Electronically signed by: Rogelia Myers MD 11/30/2024 03:26 PM EST RP Workstation: [id number]   _______________________________________________________________________________________________________ Latest  Blood pressure 119/64, pulse 86, temperature 98.2 F (36.8 C), resp. rate 17, SpO2 97%.   Vitals  labs and radiology finding personally reviewed  Review of Systems:    Pertinent positives include:  , fatigue,  Constitutional:  No weight loss, night sweats, Fevers, chillsloss  HEENT:  No headaches, Difficulty swallowing,Tooth/dental problems,Sore throat,  No sneezing, itching, ear ache, nasal congestion, post nasal drip,  Cardio-vascular:  No chest pain, Orthopnea, PND, anasarca, dizziness, palpitations.no Bilateral lower extremity swelling  GI:  No heartburn, indigestion, abdominal pain, nausea, vomiting, diarrhea, change in bowel habits, loss of appetite, melena, blood in stool, hematemesis Resp:  no shortness of breath at rest. No dyspnea on exertion, No excess mucus, no productive cough, No non-productive cough, No coughing up of blood.No change in color of mucus.No wheezing. Skin:  no rash or lesions. No jaundice GU:  no dysuria, change in color of urine, no urgency or frequency. No straining to urinate.  No flank pain.  Musculoskeletal:  No joint pain or no joint swelling. No decreased range of motion. No back pain.   Psych:  No change in mood or affect. No depression or anxiety. No memory loss.  Neuro: no localizing neurological complaints, no tingling, no weakness, no double vision, no gait abnormality, no slurred speech, no confusion  All systems reviewed and apart from HOPI all are negative _______________________________________________________________________________________________ Past Medical History:   Past Medical History:  Diagnosis Date   Arthritis    Atrial fibrillation (HCC)    Cancer (HCC)    prostate   Dysrhythmia    afib   History of kidney stones       Past Surgical History:  Procedure Laterality Date   ATRIAL FIBRILLATION ABLATION N/A 10/29/2021   Procedure: ATRIAL FIBRILLATION ABLATION;  Surgeon: Inocencio Soyla Lunger, MD;  Location: [facility]IVE [facility];  Service: Cardiovascular;  Laterality: N/A;   ATRIAL FIBRILLATION ABLATION N/A 06/29/2023   Procedure: ATRIAL FIBRILLATION ABLATION;  Surgeon: Inocencio Soyla Lunger, MD;  Location: [facility];  Service: Cardiovascular;  Laterality: N/A;   Bone spur     CARDIOVERSION N/A 06/01/2022   Procedure: CARDIOVERSION;  Surgeon: Kate Lonni CROME, MD;  Location: [facility] ENDOSCOPY;  Service: Cardiovascular;  Laterality: N/A;   CHOLECYSTECTOMY     HERNIA REPAIR     JOINT REPLACEMENT     kidney stones     LAMINECTOMY WITH POSTERIOR LATERAL ARTHRODESIS LEVEL 1 Right 11/25/2019   Procedure: Laminectomy and Foraminotomy  Lumbar Four-Lumbar Five - right, with instrumented fusion;  Surgeon: Joshua Alm RAMAN, MD;  Location: [facility];  Service: Neurosurgery;  Laterality: Right;  Laminectomy and Foraminotomy  Lumbar Four-Lumbar Five - right, with instrumented fusion   PROSTATE SURGERY     TEE WITHOUT CARDIOVERSION N/A 06/01/2022   Procedure: TRANSESOPHAGEAL ECHOCARDIOGRAM (TEE);  Surgeon: Kate Lonni CROME, MD;  Location: [facility] ENDOSCOPY;  Service: Cardiovascular;  Laterality:  N/A;    Social History:  Ambulatory  walker      reports  that he quit smoking about 27 years ago. His smoking use included cigarettes. He has never used smokeless tobacco. He reports that he does not drink alcohol and does not use drugs.   Family History:   Family History  Problem Relation Age of Onset   Hypertension Father    CAD Neg Hx    Heart failure Neg Hx    Stroke Neg Hx    ______________________________________________________________________________________________ Allergies: Allergies[1]   Prior to Admission medications  Medication Sig Start Date End Date Taking? Authorizing Provider  ELIQUIS  5 MG TABS tablet TAKE 1 TABLET BY MOUTH 2 TIMES A DAY Patient not taking: Reported on 11/30/2024 10/14/24   Inocencio Soyla Lunger, MD  ferrous sulfate  325 (65 FE) MG tablet Take 325 mg by mouth daily with breakfast.    [provider]  metoprolol  tartrate (LOPRESSOR ) 25 MG tablet Take 1 tablet (25 mg total) by mouth daily as needed (PALPITATIONS LSTING LONGER THATN 30 MINUTES). 09/18/24   Ursuy, Renee Lynn, PA-C  nitroGLYCERIN  (NITROSTAT ) 0.4 MG SL tablet Place 1 tablet (0.4 mg total) under the tongue every 5 (five) minutes as needed for chest pain. 09/18/24   Leverne Charlies Helling, PA-C    ___________________________________________________________________________________________________ Physical Exam:    11/30/2024    7:00 PM 11/30/2024    3:42 PM 11/30/2024    2:02 PM  Vitals with BMI  Systolic 119 110 891  Diastolic 64 74 63  Pulse 86 99 88     1. General:  in No  Acute distress    Chronically ill   -appearing 2. Psychological: Alert and   Oriented 3. Head/ENT:    Dry Mucous Membranes                          Head Non traumatic, neck supple                         Poor Dentition 4. SKIN:   decreased Skin turgor,  Skin clean Dry and intact no rash    5. Heart: Regular rate and rhythm no  Murmur, no Rub or gallop 6. Lungs:   no wheezes or crackles   7. Abdomen: Soft,  non-tender, Non distended   bowel sounds present 8.  Lower extremities: no clubbing, cyanosis, no  edema 9. Neurologically Grossly intact, moving all 4 extremities equally   10. MSK: Normal range of motion    Chart has been reviewed  ______________________________________________________________________________________________  Assessment/Plan 88 y.o. male with medical history significant of anemia CAD A-fib, GI bleed, colon cancer, HLD, RLS, prostate cancer, IDA, restrictive lung disease, bronchiectasis, CKD stage III   Admitted for   Pneumonia of right lower lobe due to infectious organism  acute respiratory failure   Present on Admission:  CAP (community acquired pneumonia)  CAD (coronary artery disease), native coronary artery  HTN (hypertension)  Chest pain at rest  CKD-3A  Normocytic anemia  Tachycardia-bradycardia syndrome (HCC)  Acute respiratory failure with hypoxia (HCC)  PAF (paroxysmal atrial fibrillation) (HCC)  Hyperkalemia     CAD (coronary artery disease), native coronary artery Troponin stable, order echo in AM serial ECG  Moniotr on tele  HTN (hypertension) Allow permissive HTn   Chest pain at rest Cycle ce Possibly in the setting of CAP  CAP (community acquired pneumonia)  - -Patient presenting with  productive  cough,   hypoxia  , and infiltrate in   lower lobe on chest CT community-acquired pneumonia.         will admit for treatment of CAP will start on appropriate antibiotic coverage. - Rocephin /azithromycin    Obtain:  sputum cultures,                  Obtain respiratory panel                    influenza serologies negative                  COVID PCR negative                  blood cultures and sputum cultures ordered                   strep pneumo UA antigen,                   check for Legionella antigen.                Provide oxygen  as needed.  10:16 PM Procalcitonin unremarkable patient remains afebrile will consider DC antibiotics in the morning   CKD-3A  -chronic avoid nephrotoxic  medications such as NSAIDs, Vanco Zosyn combo,  avoid hypotension, continue to follow renal function   Normocytic anemia Obtain anemia panel  Transfuse for Hg <7 , rapidly dropping or  if symptomatic   Tachycardia-bradycardia syndrome (HCC) Sp pacemaker  Acute respiratory failure with hypoxia (HCC)  this patient has acute respiratory failure with Hypoxia and  as documented by the presence of following: O2 saturatio< 90% on RA  Likely due to:   Pneumonia,   Provide O2 therapy and titrate as needed  Continuous pulse ox   check Pulse ox with ambulation prior to discharge   may need  TC consult for home O2 set up    flutter valve ordered   PAF (paroxysmal atrial fibrillation) (HCC) Eliquis  on hold secondary to history of GI bleed Metoprolol  on hold secondary to history of hypotension  Bronchiectasis without complication (HCC) Albuterol  as needed  Could be contributing to hypoxia May benefit from pulmonology follow-up as an outpatient  Hyperkalemia Rehydrate and recheck   Other plan as per orders.  DVT prophylaxis:  SCD    Code Status:  DNR/DNI   as per patient    I had personally discussed CODE STATUS with patient and family  ACP   none   Family Communication:   Family at  Bedside  plan of care was discussed   with   Sons,  Diet  heart healthy   Disposition Plan:     To home once workup is complete and patient is stable   Following barriers for discharge:                             Chest pain work up is complete                                                            Anemia  stable  Pain controlled with PO medications                               Afebrile, white count improving able to transition to PO antibiotics                             Will need to be able to tolerate PO                            Will likely need home health, home O2, set up                           Will need consultants to evaluate patient prior to  discharge                           Work of breathing improves       Consult Orders  (From admission, onward)           Start     Ordered   11/30/24 1956  Consult to hospitalist  Once       Provider:  (Not yet assigned)  Question Answer Comment  Place call to: [facility]   Reason for Consult Admit      11/30/24 1955                               Consults called:    NONE   Admission status:  ED Disposition     ED Disposition  Admit   Condition  --   Comment  Hospital Area: [facility] [[id number]]  Level of Care: Telemetry [5]  Admit to tele based on following criteria: Other see comments  Comments: cap  May admit patient to [facility] or [facility] if equivalent level of care is available:: No  Diagnosis: CAP (community acquired pneumonia) [659315]  Admitting Physician: Merland Holness [[id number]]  Attending Physician: Arryana Tolleson [[id number]]  Certification:: I certify this patient will need inpatient services for at least 2 midnights  Expected Medical Readiness: 12/03/2024            inpatient     I Expect 2 midnight stay secondary to severity of patient's current illness need for inpatient interventions justified by the following:  hemodynamic instability despite optimal treatment ( hypoxia,  )   Severe lab/radiological/exam abnormalities including:    Pneumonia of right lower lobe due to infectious organism    and extensive comorbidities including: CAD   That are currently affecting medical management.   I expect  patient to be hospitalized for 2 midnights requiring inpatient medical care.  Patient is at high risk for adverse outcome (such as loss of life or disability) if not treated.  Indication for inpatient stay as follows:    persistent chest pain despite medical management   Need for IV antibiotics, IV fluids,, IV pain medications,    Level of care     tele  For  24H    Alzina Golda 11/30/2024, 10:29  PM    [facility]     after 2 AM please page floor coverage   If 7AM-7PM, please contact the day team taking care of the patient using [email or web address]        [  1]  Allergies Allergen Reactions   Ciprofloxacin Anaphylaxis, Hives and Other (See Comments)    Dizziness, also   Tramadol Swelling and Other (See Comments)    Laryngeal edema   Duloxetine Other (See Comments)    Delusions    Gabapentin  Other (See Comments)    Affects eyesight   Hydrocodone-Acetaminophen  Nausea And Vomiting   Nisoldipine Other (See Comments)    Pt unsure of reaction (Sular, for hypertension)   Benadryl [Diphenhydramine] Anxiety and Other (See Comments)    Panic attack    Methocarbamol Nausea Only   Ondansetron  Hcl Nausea Only   Sulfa Antibiotics Rash   "

## 2024-11-30 NOTE — Assessment & Plan Note (Signed)
 Troponin stable, order echo in AM serial ECG  Moniotr on tele

## 2024-11-30 NOTE — ED Notes (Signed)
 Walked pt 38ft 02 went down to 88

## 2024-12-01 ENCOUNTER — Inpatient Hospital Stay (HOSPITAL_COMMUNITY)

## 2024-12-01 ENCOUNTER — Other Ambulatory Visit (HOSPITAL_COMMUNITY): Payer: Self-pay

## 2024-12-01 DIAGNOSIS — R079 Chest pain, unspecified: Secondary | ICD-10-CM | POA: Diagnosis not present

## 2024-12-01 DIAGNOSIS — J189 Pneumonia, unspecified organism: Secondary | ICD-10-CM | POA: Diagnosis not present

## 2024-12-01 DIAGNOSIS — R7989 Other specified abnormal findings of blood chemistry: Secondary | ICD-10-CM

## 2024-12-01 LAB — STREP PNEUMONIAE URINARY ANTIGEN: Strep Pneumo Urinary Antigen: NEGATIVE

## 2024-12-01 LAB — ECHOCARDIOGRAM LIMITED
Area-P 1/2: 4.68 cm2
Height: 75 in
Single Plane A4C EF: 65 %
Weight: 2814.83 [oz_av]

## 2024-12-01 LAB — COMPREHENSIVE METABOLIC PANEL WITH GFR
ALT: 12 U/L (ref 0–44)
AST: 16 U/L (ref 15–41)
Albumin: 3.9 g/dL (ref 3.5–5.0)
Alkaline Phosphatase: 88 U/L (ref 38–126)
Anion gap: 7 (ref 5–15)
BUN: 16 mg/dL (ref 8–23)
CO2: 25 mmol/L (ref 22–32)
Calcium: 8.9 mg/dL (ref 8.9–10.3)
Chloride: 111 mmol/L (ref 98–111)
Creatinine, Ser: 1.12 mg/dL (ref 0.61–1.24)
GFR, Estimated: >60 mL/min
Glucose, Bld: 95 mg/dL (ref 70–99)
Potassium: 5.3 mmol/L — ABNORMAL HIGH (ref 3.5–5.1)
Sodium: 142 mmol/L (ref 135–145)
Total Bilirubin: 1.2 mg/dL (ref 0.0–1.2)
Total Protein: 6.5 g/dL (ref 6.5–8.1)

## 2024-12-01 LAB — CBC
HCT: 25.5 % — ABNORMAL LOW (ref 39.0–52.0)
Hemoglobin: 8 g/dL — ABNORMAL LOW (ref 13.0–17.0)
MCH: 30.9 pg (ref 26.0–34.0)
MCHC: 31.4 g/dL (ref 30.0–36.0)
MCV: 98.5 fL (ref 80.0–100.0)
Platelets: 243 K/uL (ref 150–400)
RBC: 2.59 MIL/uL — ABNORMAL LOW (ref 4.22–5.81)
RDW: 26.3 % — ABNORMAL HIGH (ref 11.5–15.5)
WBC: 6.2 K/uL (ref 4.0–10.5)
nRBC: 0 % (ref 0.0–0.2)

## 2024-12-01 LAB — FOLATE: Folate: 4 ng/mL — ABNORMAL LOW

## 2024-12-01 LAB — PHOSPHORUS: Phosphorus: 3.2 mg/dL (ref 2.5–4.6)

## 2024-12-01 LAB — MAGNESIUM: Magnesium: 2 mg/dL (ref 1.7–2.4)

## 2024-12-01 LAB — VITAMIN B12: Vitamin B-12: 1007 pg/mL — ABNORMAL HIGH (ref 180–914)

## 2024-12-01 MED ORDER — FOLIC ACID 1 MG PO TABS: 1.0000 mg | ORAL_TABLET | Freq: Every day | ORAL | 0 refills | Status: AC

## 2024-12-01 MED ORDER — AZITHROMYCIN 500 MG PO TABS: 500.0000 mg | ORAL_TABLET | Freq: Every day | ORAL | 0 refills | Status: DC

## 2024-12-01 MED ORDER — CEFDINIR 300 MG PO CAPS: 300.0000 mg | ORAL_CAPSULE | Freq: Two times a day (BID) | ORAL | 0 refills | Status: DC

## 2024-12-01 MED ORDER — FOLIC ACID 1 MG PO TABS
1.0000 mg | ORAL_TABLET | Freq: Every day | ORAL | 0 refills | Status: DC
  Filled 2024-12-01: qty 30, 30d supply, fill #0

## 2024-12-01 MED ORDER — SODIUM ZIRCONIUM CYCLOSILICATE 5 G PO PACK
5.0000 g | PACK | Freq: Once | ORAL | Status: AC
  Administered 2024-12-01: 5 g via ORAL
  Filled 2024-12-01: qty 1

## 2024-12-01 MED ORDER — FOLIC ACID 1 MG PO TABS: 1.0000 mg | ORAL_TABLET | Freq: Every day | ORAL | 0 refills | Status: DC

## 2024-12-01 MED ORDER — CEFDINIR 300 MG PO CAPS
300.0000 mg | ORAL_CAPSULE | Freq: Two times a day (BID) | ORAL | 0 refills | Status: DC
  Filled 2024-12-01: qty 8, 4d supply, fill #0

## 2024-12-01 MED ORDER — AZITHROMYCIN 500 MG PO TABS
500.0000 mg | ORAL_TABLET | Freq: Every day | ORAL | 0 refills | Status: DC
  Filled 2024-12-01: qty 4, 4d supply, fill #0

## 2024-12-01 NOTE — Care Management Obs Status (Signed)
 MEDICARE OBSERVATION STATUS NOTIFICATION   Patient Details  Name: Gedeon Brandow MRN: [id number] Date of Birth: 27-Nov-1936   Medicare Observation Status Notification Given:  Yes    Sonda Manuella Quill, RN 12/01/2024, 2:00 PM

## 2024-12-01 NOTE — Care Management CC44 (Signed)
"         Condition Code 44 Documentation Completed  Patient Details  Name: Aryav Wimberly MRN: [id number] Date of Birth: [birth year]   Condition Code 44 given:  Yes Patient signature on Condition Code 44 notice:  Yes Documentation of 2 MD's agreement:  Yes Code 44 added to claim:  Yes    Sonda Manuella Quill, RN 12/01/2024, 2:00 PM  "

## 2024-12-01 NOTE — Discharge Summary (Signed)
 Physician Discharge Summary  Todd Salazar FMW:[id number] DOB: Sep 29, 1936 DOA: 11/30/2024  PCP: Patient, No Pcp Per  Admit date: 11/30/2024 Discharge date: 12/01/2024  Admitted From: Home Disposition:  Home  Recommendations for Outpatient Follow-up:  Follow up with PCP  Discharge Condition: Stable CODE STATUS: DNR  Diet recommendation:  Diet Orders (From admission, onward)     Start     Ordered   11/30/24 2219  Diet Heart Room service appropriate? Yes; Fluid consistency: Thin  Diet effective now       Question Answer Comment  Room service appropriate? Yes   Fluid consistency: Thin      11/30/24 2218           Brief/Interim Summary: From H&P by Dr. Silvester: Todd Salazar is a 88 y.o. male with medical history significant of anemia CAD A-fib, GI bleed, colon cancer, HLD, RLS, prostate cancer, IDA, restrictive lung disease, bronchiectasis, CKD stage III  Presented with pain shortness of breath Reports chest pain and shortness of breath that started today  History of A-fib has been off of Eliquis  secondary to GI bleed Went to urgent care today complaining of chest pain shortness of breath started today suddenly but was feeling somewhat unwell even yesterday No cough or fevers no chills no bodyaches Recent admission for pneumonia Reports chest pain is tightness   that does not radiate no nausea no vomiting History of pericarditis in 2024 also known history of CAD  chest x-ray done at urgent care nonacute He was sent to emergency department for further workup   Troponin noted to be mildly elevated 36 CTA was done and showed no PE but did show right lower lobe pneumonia of note patient had right lower lobe pneumonia also on previous admission Patient noted to be hypoxic down to 88% with ambulation    Has been off Eliquis  since Monday 6 days, no bleeding  Reports CP has resolved now it felt like an elephant siting on his chest. The CP seem to be non-positional, not worse or  better with activity Taking a deep breath made it worse  Pain has been non stop ongoing since yesterday night  Denies any cough or fever at home  No sick contacts   Has been trying to do home PT twice a week for the past week.  Chest pain has completely resolved this morning.  Echocardiogram was completed which reveals EF 60 to 65%, no regional wall motion abnormalities.  Troponin remained flat in trend, 36, 33.  Folic acid  was found to be low and replacement ordered.  COVID, influenza, RSV, strep pneumo antigen negative.  CTA chest negative for PE.  Did find small to moderate size subpleural consolidation posterior right lower lobe, suspicious for pneumonia.  Patient was started on Rocephin , azithromycin  and was discharged home in stable condition on p.o. antibiotics.  Discharge Diagnoses:   Principal Problem:   CAP (community acquired pneumonia) Active Problems:   Chest pain at rest   CAD (coronary artery disease), native coronary artery   HTN (hypertension)   PAF (paroxysmal atrial fibrillation) (HCC)   Bronchiectasis without complication (HCC)   CKD-3A   Normocytic anemia   Tachycardia-bradycardia syndrome (HCC)   Acute respiratory failure with hypoxia (HCC)   Hyperkalemia  Discharge Instructions  Discharge Instructions     Call MD for:  difficulty breathing, headache or visual disturbances   Complete by: As directed    Call MD for:  extreme fatigue   Complete by: As directed    Call  MD for:  persistant dizziness or light-headedness   Complete by: As directed    Call MD for:  persistant nausea and vomiting   Complete by: As directed    Call MD for:  severe uncontrolled pain   Complete by: As directed    Call MD for:  temperature >100.4   Complete by: As directed    Discharge instructions   Complete by: As directed    You were cared for by a hospitalist during your hospital stay. If you have any questions about your discharge medications or the care you received while  you were in the hospital after you are discharged, you can call the unit and ask to speak with the hospitalist on call if the hospitalist that took care of you is not available. Once you are discharged, your primary care physician will handle any further medical issues. Please note that NO REFILLS for any discharge medications will be authorized once you are discharged, as it is imperative that you return to your primary care physician (or establish a relationship with a primary care physician if you do not have one) for your aftercare needs so that they can reassess your need for medications and monitor your lab values.   Increase activity slowly   Complete by: As directed       Allergies as of 12/01/2024       Reactions   Ciprofloxacin Anaphylaxis, Hives, Other (See Comments)   Dizziness, also   Tramadol Swelling, Other (See Comments)   Laryngeal edema   Duloxetine Other (See Comments)   Delusions   Gabapentin  Other (See Comments)   Affects eyesight and makes the patient very unsteady   Hydrocodone-acetaminophen  Nausea And Vomiting   Nisoldipine Other (See Comments)   Pt unsure of reaction (Sular, for hypertension)   Benadryl [diphenhydramine] Anxiety, Other (See Comments)   Panic attack    Methocarbamol Nausea Only   Ondansetron  Hcl Nausea Only   Sulfa Antibiotics Rash        Medication List     PAUSE taking these medications    Eliquis  5 MG Tabs tablet Wait to take this until your doctor or other care provider tells you to start again. Generic drug: apixaban  TAKE 1 TABLET BY MOUTH 2 TIMES A DAY       TAKE these medications    azithromycin  500 MG tablet Commonly known as: Zithromax  Take 1 tablet (500 mg total) by mouth daily for 4 days.   cefdinir  300 MG capsule Commonly known as: OMNICEF  Take 1 capsule (300 mg total) by mouth 2 (two) times daily.   ferrous sulfate  325 (65 FE) MG tablet Take 325 mg by mouth every other day.   folic acid  1 MG  tablet Commonly known as: FOLVITE  Take 1 tablet (1 mg total) by mouth daily.   metoprolol  tartrate 25 MG tablet Commonly known as: LOPRESSOR  Take 1 tablet (25 mg total) by mouth daily as needed (PALPITATIONS LSTING LONGER THATN 30 MINUTES). What changed: reasons to take this   nitroGLYCERIN  0.4 MG SL tablet Commonly known as: NITROSTAT  Place 1 tablet (0.4 mg total) under the tongue every 5 (five) minutes as needed for chest pain.        Follow-up Information     Orlando Ole Hamilton, FNP Follow up.   Specialties: Emergency Medicine, Family Medicine Contact information: [phone] [place] [address] [phone]                Allergies[1]  Procedures/Studies: ECHOCARDIOGRAM LIMITED Result Date: 12/01/2024    ECHOCARDIOGRAM LIMITED REPORT   Patient Name:   Todd Salazar Date of Exam: 12/01/2024 Medical Rec #:  [id number]    Height:       75.0 in Accession #:    [id number]   Weight:       175.9 lb Date of Birth:  04-01-1936    BSA:          2.078 m Patient Age:    88 years     BP:           102/62 mmHg Patient Gender: M            HR:           69 bpm. Exam Location:  Inpatient Procedure: 2D Echo and Limited Echo (Both Spectral and Color Flow Doppler were            utilized during procedure). Indications:    Elevated Troponin  History:        Patient has prior history of Echocardiogram examinations, most                 recent 10/11/2024. Risk Factors:Hypertension.  Sonographer:    Nathanel Devonshire Referring Phys: [id number] ANASTASSIA DOUTOVA IMPRESSIONS  1. Left ventricular ejection fraction, by estimation, is 60 to 65%. The left ventricle has normal function. The left ventricle has no regional wall motion abnormalities.  2. Right ventricular systolic function is normal. The right ventricular size is mildly enlarged.  3. The mitral valve is normal in structure. No evidence of mitral valve regurgitation.  4. The aortic valve is tricuspid. Aortic valve regurgitation is not  visualized. Comparison(s): Changes from prior study are noted. LV size has normalized and EF is similar. FINDINGS  Left Ventricle: Left ventricular ejection fraction, by estimation, is 60 to 65%. The left ventricle has normal function. The left ventricle has no regional wall motion abnormalities. Right Ventricle: The right ventricular size is mildly enlarged. Right ventricular systolic function is normal. Mitral Valve: The mitral valve is normal in structure. Tricuspid Valve: The tricuspid valve is not well visualized. Aortic Valve: The aortic valve is tricuspid. Aortic valve regurgitation is not visualized. Pulmonic Valve: The pulmonic valve was normal in structure. Pulmonic valve regurgitation is trivial. IAS/Shunts: No atrial level shunt detected by color flow Doppler.  LV Volumes (MOD) LV vol d, MOD A4C: 74.1 ml Diastology LV vol s, MOD A2C: 39.1 ml LV e' medial:   7.51 cm/s LV vol s, MOD A4C: 25.9 ml LV E/e' medial: 14.0 LV SV MOD A4C:     74.1 ml MITRAL VALVE MV Area (PHT): 4.68 cm MV E velocity: 105.00 cm/s MV A velocity: 31.70 cm/s MV E/A ratio:  3.31 Franck Azobou Tonleu Electronically signed by Joelle Cedars Tonleu Signature Date/Time: 12/01/2024/12:24:50 PM    Final    CT Angio Chest PE W and/or Wo Contrast Result Date: 11/30/2024 CLINICAL DATA:  Provided history: Pulmonary embolism (PE) suspected, low to intermediate prob, neg D-dimer 88 year old with shortness of breath. EXAM: CT ANGIOGRAPHY CHEST WITH CONTRAST TECHNIQUE: Multidetector CT imaging of the chest was performed using the standard protocol during bolus administration of intravenous contrast. Multiplanar CT image reconstructions and MIPs were obtained to evaluate the vascular anatomy. RADIATION DOSE REDUCTION: This exam was performed according to the departmental dose-optimization program which includes automated exposure control, adjustment of the mA and/or kV according to patient size and/or use of iterative reconstruction technique.  CONTRAST:  75mL OMNIPAQUE  IOHEXOL  350  MG/ML SOLN COMPARISON:  Radiograph earlier today.  Chest CTA 07/10/2023 FINDINGS: Cardiovascular: There are no filling defects within the pulmonary arteries to suggest pulmonary embolus. Dilated main pulmonary artery at 3.6 cm. The heart is enlarged. Left-sided pacemaker in place. No pericardial effusion. Aortic atherosclerosis without aneurysm or acute aortic findings. The descending aorta is tortuous. Mediastinum/Nodes: Shotty mediastinal and right hilar lymph nodes, not enlarged by size criteria. Patulous upper esophagus. No esophageal wall thickening. No visible thyroid  nodule. Lungs/Pleura: Small to moderate size subpleural consolidation within the posterior right lower lobe. The consolidation previously seen in the anterior right lower lobe has resolved. Mild patchy ground-glass opacity in the dependent left lower lobe. Biapical pleuroparenchymal scarring. Please note breathing motion artifact limits detailed parenchymal assessment. The previous right middle lobe nodule is grossly stable, primarily motion obscured, series 12 image 98. No pleural effusion. Trachea and central airways are clear. Upper Abdomen: No acute upper abdominal findings. Punctate nonobstructing left renal calculi. Musculoskeletal: Degenerative change in the spine and shoulders. Review of the MIP images confirms the above findings. IMPRESSION: 1. No pulmonary embolus. 2. Small to moderate size subpleural consolidation in the posterior right lower lobe, suspicious for pneumonia. 3. Mild patchy ground-glass opacity in the dependent left lower lobe, likely atelectasis. 4. Dilated main pulmonary artery, suggesting pulmonary arterial hypertension. Chronic cardiomegaly. Aortic Atherosclerosis (ICD10-I70.0). Electronically Signed   By: Andrea Gasman M.D.   On: 11/30/2024 18:54   DG Chest 2 View Result Date: 11/30/2024 EXAM: 2 VIEW(S) XRAY OF THE CHEST 11/30/2024 02:42:33 PM COMPARISON: 07/10/2023  CLINICAL HISTORY: Shortness of breath FINDINGS: LUNGS AND PLEURA: Biapical pleural thickening. No pulmonary edema. No pleural effusion. No pneumothorax. Streaky opacities in the right lung base, likely atelectasis no scarring. HEART AND MEDIASTINUM: Well positioned left chest pacemaker with leads terminating in the right atrium and right ventricle. BONES AND SOFT TISSUES: Partially visualized posterior fusion hardware in the lumbar spine. Multilevel thoracolumbar osteophytosis. IMPRESSION: 1. No acute cardiopulmonary abnormality. Electronically signed by: Rogelia Myers MD 11/30/2024 03:26 PM EST RP Workstation: [id number]       Discharge Exam: Vitals:   12/01/24 0636 12/01/24 1054  BP: 102/62 (!) 95/50  Pulse: 66 66  Resp: 18 18  Temp: 98 F (36.7 C) 98.6 F (37 C)  SpO2: 96% 97%    General: Pt is alert, awake, not in acute distress Cardiovascular: RRR, S1/S2 +, no edema Respiratory: CTA bilaterally, no wheezing, no rhonchi, no respiratory distress, no conversational dyspnea  Abdominal: Soft, NT, ND, bowel sounds + Extremities: no edema, no cyanosis Psych: Normal mood and affect, stable judgement and insight     The results of significant diagnostics from this hospitalization (including imaging, microbiology, ancillary and laboratory) are listed below for reference.     Microbiology: Recent Results (from the past 240 hours)  Resp panel by RT-PCR (RSV, Flu A&B, Covid) Anterior Nasal Swab     Status: None   Collection Time: 11/30/24  5:10 PM   Specimen: Anterior Nasal Swab  Result Value Ref Range Status   SARS Coronavirus 2 by RT PCR NEGATIVE NEGATIVE Final    Comment: (NOTE) SARS-CoV-2 target nucleic acids are NOT DETECTED.  The SARS-CoV-2 RNA is generally detectable in upper respiratory specimens during the acute phase of infection. The lowest concentration of SARS-CoV-2 viral copies this assay can detect is 138 copies/mL. A negative result does not preclude  SARS-Cov-2 infection and should not be used as the sole basis for treatment or other patient management decisions. A negative result  may occur with  improper specimen collection/handling, submission of specimen other than nasopharyngeal swab, presence of viral mutation(s) within the areas targeted by this assay, and inadequate number of viral copies(<138 copies/mL). A negative result must be combined with clinical observations, patient history, and epidemiological information. The expected result is Negative.  Fact Sheet for Patients:  bloggercourse.com  Fact Sheet for Healthcare Providers:  seriousbroker.it  This test is no t yet approved or cleared by the [place]  FDA and  has been authorized for detection and/or diagnosis of SARS-CoV-2 by FDA under an Emergency Use Authorization (EUA). This EUA will remain  in effect (meaning this test can be used) for the duration of the COVID-19 declaration under Section 564(b)(1) of the Act, 21 U.S.C.section 360bbb-3(b)(1), unless the authorization is terminated  or revoked sooner.       Influenza A by PCR NEGATIVE NEGATIVE Final   Influenza B by PCR NEGATIVE NEGATIVE Final    Comment: (NOTE) The Xpert Xpress SARS-CoV-2/FLU/RSV plus assay is intended as an aid in the diagnosis of influenza from Nasopharyngeal swab specimens and should not be used as a sole basis for treatment. Nasal washings and aspirates are unacceptable for Xpert Xpress SARS-CoV-2/FLU/RSV testing.  Fact Sheet for Patients: bloggercourse.com  Fact Sheet for Healthcare Providers: seriousbroker.it  This test is not yet approved or cleared by the [place]  FDA and has been authorized for detection and/or diagnosis of SARS-CoV-2 by FDA under an Emergency Use Authorization (EUA). This EUA will remain in effect (meaning this test can be used) for the duration of  the COVID-19 declaration under Section 564(b)(1) of the Act, 21 U.S.C. section 360bbb-3(b)(1), unless the authorization is terminated or revoked.     Resp Syncytial Virus by PCR NEGATIVE NEGATIVE Final    Comment: (NOTE) Fact Sheet for Patients: bloggercourse.com  Fact Sheet for Healthcare Providers: seriousbroker.it  This test is not yet approved or cleared by the [place]  FDA and has been authorized for detection and/or diagnosis of SARS-CoV-2 by FDA under an Emergency Use Authorization (EUA). This EUA will remain in effect (meaning this test can be used) for the duration of the COVID-19 declaration under Section 564(b)(1) of the Act, 21 U.S.C. section 360bbb-3(b)(1), unless the authorization is terminated or revoked.  Performed at [facility], [address] [place], [place] [address]      Labs: BNP (last 3 results) No results for input(s): BNP in the last 8760 hours. Basic Metabolic Panel: Recent Labs  Lab 11/30/24 1703 11/30/24 1932 11/30/24 2223 12/01/24 0413  NA 142  --  141 142  K 5.4*  --  4.7 5.3*  CL 110  --  110 111  CO2 24  --  22 25  GLUCOSE 85  --  102* 95  BUN 19  --  17 16  CREATININE 1.18  --  1.13 1.12  CALCIUM  9.5  --  8.8* 8.9  MG  --  1.9  --  2.0  PHOS  --  2.5  --  3.2   Liver Function Tests: Recent Labs  Lab 11/30/24 1703 12/01/24 0413  AST 19 16  ALT 16 12  ALKPHOS 84 88  BILITOT 1.3* 1.2  PROT 7.3 6.5  ALBUMIN 4.3 3.9   No results for input(s): LIPASE, AMYLASE in the last 168 hours. No results for input(s): AMMONIA in the last 168 hours. CBC: Recent Labs  Lab 11/30/24 1703 12/01/24 0413  WBC 7.1 6.2  NEUTROABS 4.0  --   HGB  8.9* 8.0*  HCT 27.9* 25.5*  MCV 97.9 98.5  PLT 307 243   Cardiac Enzymes: Recent Labs  Lab 11/30/24 1932  CKTOTAL <10*   BNP: Invalid input(s): POCBNP CBG: No results for input(s): GLUCAP in the last  168 hours. D-Dimer No results for input(s): DDIMER in the last 72 hours. Hgb A1c No results for input(s): HGBA1C in the last 72 hours. Lipid Profile No results for input(s): CHOL, HDL, LDLCALC, TRIG, CHOLHDL, LDLDIRECT in the last 72 hours. Thyroid  function studies No results for input(s): TSH, T4TOTAL, T3FREE, THYROIDAB in the last 72 hours.  Invalid input(s): FREET3 Anemia work up Recent Labs    11/30/24 1703 11/30/24 2047 12/01/24 0413  VITAMINB12  --   --  1,007*  FOLATE  --   --  4.0*  FERRITIN  --  1,069*  --   TIBC  --  178*  --   IRON  --  43*  --   RETICCTPCT 0.6  --   --    Urinalysis    Component Value Date/Time   COLORURINE YELLOW 04/15/2022 2250   APPEARANCEUR CLEAR 04/15/2022 2250   LABSPEC 1.016 04/15/2022 2250   PHURINE 6.0 04/15/2022 2250   GLUCOSEU NEGATIVE 04/15/2022 2250   HGBUR NEGATIVE 04/15/2022 2250   BILIRUBINUR NEGATIVE 04/15/2022 2250   KETONESUR NEGATIVE 04/15/2022 2250   PROTEINUR NEGATIVE 04/15/2022 2250   UROBILINOGEN 1.0 12/11/2011 0005   NITRITE NEGATIVE 04/15/2022 2250   LEUKOCYTESUR NEGATIVE 04/15/2022 2250   Sepsis Labs Recent Labs  Lab 11/30/24 1703 12/01/24 0413  WBC 7.1 6.2   Microbiology Recent Results (from the past 240 hours)  Resp panel by RT-PCR (RSV, Flu A&B, Covid) Anterior Nasal Swab     Status: None   Collection Time: 11/30/24  5:10 PM   Specimen: Anterior Nasal Swab  Result Value Ref Range Status   SARS Coronavirus 2 by RT PCR NEGATIVE NEGATIVE Final    Comment: (NOTE) SARS-CoV-2 target nucleic acids are NOT DETECTED.  The SARS-CoV-2 RNA is generally detectable in upper respiratory specimens during the acute phase of infection. The lowest concentration of SARS-CoV-2 viral copies this assay can detect is 138 copies/mL. A negative result does not preclude SARS-Cov-2 infection and should not be used as the sole basis for treatment or other patient management decisions. A negative  result may occur with  improper specimen collection/handling, submission of specimen other than nasopharyngeal swab, presence of viral mutation(s) within the areas targeted by this assay, and inadequate number of viral copies(<138 copies/mL). A negative result must be combined with clinical observations, patient history, and epidemiological information. The expected result is Negative.  Fact Sheet for Patients:  bloggercourse.com  Fact Sheet for Healthcare Providers:  seriousbroker.it  This test is no t yet approved or cleared by the [place]  FDA and  has been authorized for detection and/or diagnosis of SARS-CoV-2 by FDA under an Emergency Use Authorization (EUA). This EUA will remain  in effect (meaning this test can be used) for the duration of the COVID-19 declaration under Section 564(b)(1) of the Act, 21 U.S.C.section 360bbb-3(b)(1), unless the authorization is terminated  or revoked sooner.       Influenza A by PCR NEGATIVE NEGATIVE Final   Influenza B by PCR NEGATIVE NEGATIVE Final    Comment: (NOTE) The Xpert Xpress SARS-CoV-2/FLU/RSV plus assay is intended as an aid in the diagnosis of influenza from Nasopharyngeal swab specimens and should not be used as a sole basis for treatment. Nasal washings and aspirates  are unacceptable for Xpert Xpress SARS-CoV-2/FLU/RSV testing.  Fact Sheet for Patients: bloggercourse.com  Fact Sheet for Healthcare Providers: seriousbroker.it  This test is not yet approved or cleared by the [place]  FDA and has been authorized for detection and/or diagnosis of SARS-CoV-2 by FDA under an Emergency Use Authorization (EUA). This EUA will remain in effect (meaning this test can be used) for the duration of the COVID-19 declaration under Section 564(b)(1) of the Act, 21 U.S.C. section 360bbb-3(b)(1), unless the authorization is  terminated or revoked.     Resp Syncytial Virus by PCR NEGATIVE NEGATIVE Final    Comment: (NOTE) Fact Sheet for Patients: bloggercourse.com  Fact Sheet for Healthcare Providers: seriousbroker.it  This test is not yet approved or cleared by the [place]  FDA and has been authorized for detection and/or diagnosis of SARS-CoV-2 by FDA under an Emergency Use Authorization (EUA). This EUA will remain in effect (meaning this test can be used) for the duration of the COVID-19 declaration under Section 564(b)(1) of the Act, 21 U.S.C. section 360bbb-3(b)(1), unless the authorization is terminated or revoked.  Performed at [facility], [address] [place], [place] [address]      Patient was seen and examined on the day of discharge and was found to be in stable condition. Time coordinating discharge: 35 minutes including assessment and coordination of care, as well as examination of the patient.   SIGNED:  Delon Hoe, DO [facility] 12/01/2024, 2:11 PM       [1]  Allergies Allergen Reactions   Ciprofloxacin Anaphylaxis, Hives and Other (See Comments)    Dizziness, also   Tramadol Swelling and Other (See Comments)    Laryngeal edema   Duloxetine Other (See Comments)    Delusions    Gabapentin  Other (See Comments)    Affects eyesight and makes the patient very unsteady   Hydrocodone-Acetaminophen  Nausea And Vomiting   Nisoldipine Other (See Comments)    Pt unsure of reaction (Sular, for hypertension)   Benadryl [Diphenhydramine] Anxiety and Other (See Comments)    Panic attack    Methocarbamol Nausea Only   Ondansetron  Hcl Nausea Only   Sulfa Antibiotics Rash

## 2024-12-01 NOTE — TOC Initial Note (Signed)
 Transition of Care [facility]) - Initial/Assessment Note    Patient Details  Name: Todd Salazar MRN: [id number] Date of Birth: [birth year]  Transition of Care [facility]) CM/SW Contact:    Sonda Manuella Quill, RN Phone Number: 12/01/2024, 2:23 PM  Clinical Narrative:                 [place] w/ pt in room; pt said he lives at home; he plans to return w/ family support at d/c; he identified POC dtr Kenji Mapel [phone]); his son will provide transportation; his PCP is Dr Orlando, [place], [place]; insurance verified; pt denied SDOH risks; he has walker; pt said he has HHPT but he is not sure of agency's name; pt does not have home oxygen ; chart review showed referral given to [facility] [phone]) app 11/19/24; orders were obtained for home health SN/PT/OT/CNA/SW; no IP CM needs.  Expected Discharge Plan: Home w Home Health Services Barriers to Discharge: No Barriers Identified   Patient Goals and CMS Choice Patient states their goals for this hospitalization and ongoing recovery are:: home     [place] ownership interest in [facility].provided to:: Patient    Expected Discharge Plan and Services   Discharge Planning Services: CM Consult   Living arrangements for the past 2 months: Single Family Home Expected Discharge Date: 12/01/24               DME Arranged: N/A DME Agency: NA       HH Arranged: NA HH Agency: NA        Prior Living Arrangements/Services Living arrangements for the past 2 months: Single Family Home Lives with:: Self Patient language and need for interpreter reviewed:: Yes Do you feel safe going back to the place where you live?: Yes      Need for Family Participation in Patient Care: Yes (Comment) Care giver support system in place?: Yes (comment) Current home services: DME (walker) Criminal Activity/Legal Involvement Pertinent to Current Situation/Hospitalization: No - Comment as needed  Activities of Daily Living   ADL Screening  (condition at time of admission) Independently performs ADLs?: Yes (appropriate for developmental age) Is the patient deaf or have difficulty hearing?: No Does the patient have difficulty seeing, even when wearing glasses/contacts?: No Does the patient have difficulty concentrating, remembering, or making decisions?: No  Permission Sought/Granted Permission sought to share information with : Case Manager Permission granted to share information with : Yes, Verbal Permission Granted  Share Information with NAME: Case Manager     Permission granted to share info w Relationship: Fayez Sturgell (dtr) [phone]     Emotional Assessment Appearance:: Appears stated age Attitude/Demeanor/Rapport: Gracious Affect (typically observed): Accepting Orientation: : Oriented to Self, Oriented to Place, Oriented to  Time, Oriented to Situation Alcohol / Substance Use: Not Applicable Psych Involvement: No (comment)  Admission diagnosis:  CAP (community acquired pneumonia) [J18.9] Pneumonia of right lower lobe due to infectious organism [J18.9] Patient Active Problem List   Diagnosis Date Noted   CAP (community acquired pneumonia) 11/30/2024   Acute respiratory failure with hypoxia (HCC) 11/30/2024   Hyperkalemia 11/30/2024   Multifocal pneumonia 07/11/2023   Pericarditis 07/01/2023   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   Pacemaker 07/01/2023   Atrial flutter with rapid ventricular response (HCC) 05/30/2022   Intractable pain 04/16/2022   Vomiting 04/16/2022   CKD-3A 04/16/2022   Acute right hip pain 04/16/2022   Normocytic anemia 04/16/2022   Hypercoagulable state due to persistent atrial fibrillation (HCC) 08/27/2021   Persistent atrial fibrillation (  HCC) 08/17/2021   S/P lumbar fusion 11/25/2019   Carcinoma of prostate (HCC) 09/07/2019   Lumbar radiculopathy 09/07/2019   Degeneration of lumbar intervertebral disc 09/07/2019   History of total replacement of both hip  joints 08/01/2019   Iliotibial band syndrome of right side 07/27/2019   Macrocytic anemia 07/26/2019   Hypotension 07/26/2019   Bronchiectasis without complication (HCC) 02/22/2019   DOE (dyspnea on exertion) 10/19/2018   Lung nodule < 6cm on CT 10/09/2018   Occipital neuritis 08/29/2016   Bilateral hearing loss 06/22/2016   Post herpetic neuralgia 02/02/2016   Current use of long term anticoagulation 12/18/2015   Calcification of spleen 09/13/2014   Diverticulosis 09/13/2014   Bilateral renal cysts 09/13/2014   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   Hemangioma of liver 09/13/2014   Male erectile dysfunction, unspecified 09/13/2014   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   Paraphimosis 09/13/2014   Polyp of colon 09/13/2014   Restless legs syndrome 09/13/2014   Spondylosis without myelopathy or radiculopathy, cervical region 09/13/2014   Urethral stricture 09/13/2014   Spondylosis without myelopathy or radiculopathy, lumbosacral region 09/13/2014   Anxiety 09/13/2014   Chest pain at rest 12/11/2011    Class: Acute   CAD (coronary artery disease), native coronary artery 12/11/2011    Class: Chronic   HTN (hypertension) 12/11/2011   PAF (paroxysmal atrial fibrillation) (HCC) 12/11/2011   PCP:  Patient, No Pcp Per Pharmacy:   [facility] - [place], [place] - [address] [place] [address] Phone: [phone] Fax: [phone]     Social Drivers of Health (SDOH) Social History: SDOH Screenings   Food Insecurity: No Food Insecurity (12/01/2024)  Housing: Low Risk (12/01/2024)  Transportation Needs: No Transportation Needs (12/01/2024)  Utilities: Not At Risk (12/01/2024)  Social Connections: Moderately Integrated (11/30/2024)  Tobacco Use: Medium Risk (11/30/2024)   SDOH Interventions: Food Insecurity Interventions: Intervention Not Indicated, Inpatient TOC Housing Interventions: Intervention Not Indicated, Inpatient  TOC Transportation Interventions: Intervention Not Indicated, Inpatient TOC Utilities Interventions: Intervention Not Indicated, Inpatient TOC   Readmission Risk Interventions    12/01/2024    2:21 PM 07/13/2023   12:34 PM  Readmission Risk Prevention Plan  Post Dischage Appt  Complete  Medication Screening  Complete  Transportation Screening Complete Complete  PCP or Specialist Appt within 5-7 Days Complete   Home Care Screening Complete   Medication Review (RN CM) Complete

## 2024-12-01 NOTE — Evaluation (Signed)
 Physical Therapy Evaluation Patient Details Name: Todd Salazar MRN: [id number] DOB: [birth year] Today's Date: 12/01/2024  History of Present Illness  88 yo male presents to therapy following hospital admission on 11/30/2024 due to SOB, angina and found to have CAP. Pt recently hospitalized 12/4/ of this year secondary to esophageal rupture. Pt PMH includes but is not limited to CAD, A-fib, GI bleed, colon ca, HLD, RLS, prostate ca, laminectomy, IDA, restrictive lung dz, anemia, CAD, and CKD III.  Clinical Impression  Pt admitted with above diagnosis.  Pt currently with functional limitations due to the deficits listed below (see PT Problem List). Pt in bed with family present when PT arrived. Pt agreeable to therapy intervention and motivated to return home today. Pt would like to [facility] services with pt participating 2 x wk prior to hospitalization. Pt is mod I for supine <> sit, RW and S for transfers and gait tasks-150 feet in hallway with use of RW. Pt elected to return to bed and all needs in place with family present. Pt will benefit from acute skilled PT to increase their independence and safety with mobility to allow discharge.          If plan is discharge home, recommend the following: A little help with walking and/or transfers;A little help with bathing/dressing/bathroom;Assistance with cooking/housework;Assist for transportation   Can travel by private vehicle        Equipment Recommendations None recommended by PT  Recommendations for Other Services       Functional Status Assessment Patient has had a recent decline in their functional status and demonstrates the ability to make significant improvements in function in a reasonable and predictable amount of time.     Precautions / Restrictions Precautions Precautions: Fall Restrictions Weight Bearing Restrictions Per Provider Order: No      Mobility  Bed Mobility Overal bed mobility: Modified Independent              General bed mobility comments: supine<> sit    Transfers Overall transfer level: Needs assistance Equipment used: Rolling walker (2 wheels) Transfers: Sit to/from Stand Sit to Stand: Supervision           General transfer comment: min cues and S for safety with bed and commode transfers    Ambulation/Gait Ambulation/Gait assistance: Supervision Gait Distance (Feet): 150 Feet Assistive device: Rolling walker (2 wheels) Gait Pattern/deviations: Step-through pattern Gait velocity: decreased     General Gait Details: pt demonstrating good reciprocal pattern, B LE passing in stance phase, appropriate foot clearance, no overt LOB, no reports of pain, SOB and minimal cues for safety  Stairs            Wheelchair Mobility     Tilt Bed    Modified Rankin (Stroke Patients Only)       Balance Overall balance assessment: No apparent balance deficits (not formally assessed)                                           Pertinent Vitals/Pain Pain Assessment Pain Assessment: No/denies pain    Home Living Family/patient expects to be discharged to:: Private residence Living Arrangements: Alone Available Help at Discharge: Family Type of Home: House Home Access: Level entry       Home Layout: One level Home Equipment: Agricultural Consultant (2 wheels);Cane - single point;Shower seat;Grab bars - tub/shower;Wheelchair - manual Additional Comments: strong  family and social support    Prior Function Prior Level of Function : Driving;Independent/Modified Independent             Mobility Comments: mod I with use of RW for all ADLs, self care tasks and IADLs       Extremity/Trunk Assessment        Lower Extremity Assessment Lower Extremity Assessment: Overall WFL for tasks assessed    Cervical / Trunk Assessment Cervical / Trunk Assessment: Back Surgery  Communication   Communication Communication: No apparent difficulties    Cognition  Arousal: Alert Behavior During Therapy: WFL for tasks assessed/performed   PT - Cognitive impairments: No apparent impairments                         Following commands: Intact       Cueing       General Comments      Exercises     Assessment/Plan    PT Assessment Patient needs continued PT services  PT Problem List Decreased activity tolerance;Decreased balance;Decreased mobility       PT Treatment Interventions DME instruction;Gait training;Functional mobility training;Therapeutic activities;Therapeutic exercise;Balance training;Neuromuscular re-education;Patient/family education    PT Goals (Current goals can be found in the Care Plan section)  Acute Rehab PT Goals Patient Stated Goal: to be able to go home today, feeling much better PT Goal Formulation: With patient Time For Goal Achievement: 12/08/24 Potential to Achieve Goals: Good    Frequency Min 3X/week     Co-evaluation               AM-PAC PT 6 Clicks Mobility  Outcome Measure Help needed turning from your back to your side while in a flat bed without using bedrails?: None Help needed moving from lying on your back to sitting on the side of a flat bed without using bedrails?: None Help needed moving to and from a bed to a chair (including a wheelchair)?: A Little Help needed standing up from a chair using your arms (e.g., wheelchair or bedside chair)?: A Little Help needed to walk in hospital room?: A Little Help needed climbing 3-5 steps with a railing? : A Lot 6 Click Score: 19    End of Session Equipment Utilized During Treatment: Gait belt Activity Tolerance: Patient tolerated treatment well Patient left: in bed;with call bell/phone within reach;with family/visitor present Nurse Communication: Mobility status PT Visit Diagnosis: Unsteadiness on feet (R26.81);Other abnormalities of gait and mobility (R26.89)    Time: 8680-8663 PT Time Calculation (min) (ACUTE ONLY): 17  min   Charges:   PT Evaluation $PT Eval Low Complexity: 1 Low   PT General Charges $$ ACUTE PT VISIT: 1 Visit         [place], PT Acute Rehab   [name] 12/01/2024, 5:28 PM

## 2024-12-03 LAB — LEGIONELLA PNEUMOPHILA SEROGP 1 UR AG: L. pneumophila Serogp 1 Ur Ag: NEGATIVE

## 2024-12-04 NOTE — Telephone Encounter (Signed)
 Spoke w/ patients dtr Malinda - patient is scheduled to see EP APP on 1/23.

## 2024-12-05 ENCOUNTER — Inpatient Hospital Stay (HOSPITAL_COMMUNITY)
Admission: EM | Admit: 2024-12-05 | Discharge: 2024-12-14 | DRG: 313 | Disposition: A | Discharge status: Home Health | Attending: Internal Medicine | Admitting: Internal Medicine

## 2024-12-05 ENCOUNTER — Ambulatory Visit

## 2024-12-05 ENCOUNTER — Emergency Department (HOSPITAL_COMMUNITY)

## 2024-12-05 ENCOUNTER — Encounter (HOSPITAL_COMMUNITY): Payer: Self-pay | Admitting: Emergency Medicine

## 2024-12-05 ENCOUNTER — Other Ambulatory Visit: Payer: Self-pay

## 2024-12-05 DIAGNOSIS — R072 Precordial pain: Secondary | ICD-10-CM | POA: Diagnosis present

## 2024-12-05 DIAGNOSIS — I10 Essential (primary) hypertension: Secondary | ICD-10-CM | POA: Diagnosis not present

## 2024-12-05 DIAGNOSIS — R7989 Other specified abnormal findings of blood chemistry: Secondary | ICD-10-CM | POA: Diagnosis present

## 2024-12-05 DIAGNOSIS — N200 Calculus of kidney: Secondary | ICD-10-CM | POA: Diagnosis present

## 2024-12-05 DIAGNOSIS — M1990 Unspecified osteoarthritis, unspecified site: Secondary | ICD-10-CM | POA: Diagnosis present

## 2024-12-05 DIAGNOSIS — Z8249 Family history of ischemic heart disease and other diseases of the circulatory system: Secondary | ICD-10-CM | POA: Diagnosis not present

## 2024-12-05 DIAGNOSIS — I200 Unstable angina: Secondary | ICD-10-CM | POA: Diagnosis not present

## 2024-12-05 DIAGNOSIS — L89892 Pressure ulcer of other site, stage 2: Secondary | ICD-10-CM | POA: Diagnosis present

## 2024-12-05 DIAGNOSIS — D539 Nutritional anemia, unspecified: Secondary | ICD-10-CM | POA: Diagnosis present

## 2024-12-05 DIAGNOSIS — Z881 Allergy status to other antibiotic agents status: Secondary | ICD-10-CM

## 2024-12-05 DIAGNOSIS — F419 Anxiety disorder, unspecified: Secondary | ICD-10-CM | POA: Diagnosis present

## 2024-12-05 DIAGNOSIS — I4819 Other persistent atrial fibrillation: Secondary | ICD-10-CM | POA: Diagnosis present

## 2024-12-05 DIAGNOSIS — I951 Orthostatic hypotension: Secondary | ICD-10-CM | POA: Diagnosis present

## 2024-12-05 DIAGNOSIS — G2581 Restless legs syndrome: Secondary | ICD-10-CM | POA: Diagnosis present

## 2024-12-05 DIAGNOSIS — Z882 Allergy status to sulfonamides status: Secondary | ICD-10-CM

## 2024-12-05 DIAGNOSIS — R079 Chest pain, unspecified: Secondary | ICD-10-CM | POA: Diagnosis present

## 2024-12-05 DIAGNOSIS — Z79899 Other long term (current) drug therapy: Secondary | ICD-10-CM

## 2024-12-05 DIAGNOSIS — I1310 Hypertensive heart and chronic kidney disease without heart failure, with stage 1 through stage 4 chronic kidney disease, or unspecified chronic kidney disease: Secondary | ICD-10-CM | POA: Diagnosis present

## 2024-12-05 DIAGNOSIS — J181 Lobar pneumonia, unspecified organism: Secondary | ICD-10-CM | POA: Diagnosis not present

## 2024-12-05 DIAGNOSIS — Z87891 Personal history of nicotine dependence: Secondary | ICD-10-CM | POA: Diagnosis not present

## 2024-12-05 DIAGNOSIS — I480 Paroxysmal atrial fibrillation: Secondary | ICD-10-CM | POA: Diagnosis present

## 2024-12-05 DIAGNOSIS — D62 Acute posthemorrhagic anemia: Secondary | ICD-10-CM | POA: Diagnosis not present

## 2024-12-05 DIAGNOSIS — Z950 Presence of cardiac pacemaker: Secondary | ICD-10-CM

## 2024-12-05 DIAGNOSIS — Z8546 Personal history of malignant neoplasm of prostate: Secondary | ICD-10-CM

## 2024-12-05 DIAGNOSIS — E785 Hyperlipidemia, unspecified: Secondary | ICD-10-CM | POA: Diagnosis present

## 2024-12-05 DIAGNOSIS — E538 Deficiency of other specified B group vitamins: Secondary | ICD-10-CM | POA: Diagnosis present

## 2024-12-05 DIAGNOSIS — I2510 Atherosclerotic heart disease of native coronary artery without angina pectoris: Secondary | ICD-10-CM | POA: Diagnosis present

## 2024-12-05 DIAGNOSIS — Z9049 Acquired absence of other specified parts of digestive tract: Secondary | ICD-10-CM

## 2024-12-05 DIAGNOSIS — I495 Sick sinus syndrome: Secondary | ICD-10-CM | POA: Diagnosis present

## 2024-12-05 DIAGNOSIS — D631 Anemia in chronic kidney disease: Secondary | ICD-10-CM | POA: Diagnosis present

## 2024-12-05 DIAGNOSIS — I4891 Unspecified atrial fibrillation: Secondary | ICD-10-CM | POA: Diagnosis not present

## 2024-12-05 DIAGNOSIS — C61 Malignant neoplasm of prostate: Secondary | ICD-10-CM | POA: Diagnosis not present

## 2024-12-05 DIAGNOSIS — R0789 Other chest pain: Principal | ICD-10-CM

## 2024-12-05 DIAGNOSIS — A0472 Enterocolitis due to Clostridium difficile, not specified as recurrent: Secondary | ICD-10-CM | POA: Diagnosis present

## 2024-12-05 DIAGNOSIS — I4719 Other supraventricular tachycardia: Secondary | ICD-10-CM | POA: Diagnosis present

## 2024-12-05 DIAGNOSIS — R001 Bradycardia, unspecified: Secondary | ICD-10-CM

## 2024-12-05 DIAGNOSIS — G629 Polyneuropathy, unspecified: Secondary | ICD-10-CM | POA: Diagnosis present

## 2024-12-05 DIAGNOSIS — N170 Acute kidney failure with tubular necrosis: Secondary | ICD-10-CM | POA: Diagnosis not present

## 2024-12-05 DIAGNOSIS — Z7901 Long term (current) use of anticoagulants: Secondary | ICD-10-CM

## 2024-12-05 DIAGNOSIS — N1831 Chronic kidney disease, stage 3a: Secondary | ICD-10-CM | POA: Diagnosis present

## 2024-12-05 DIAGNOSIS — J9601 Acute respiratory failure with hypoxia: Secondary | ICD-10-CM | POA: Diagnosis present

## 2024-12-05 DIAGNOSIS — K5730 Diverticulosis of large intestine without perforation or abscess without bleeding: Secondary | ICD-10-CM | POA: Diagnosis present

## 2024-12-05 DIAGNOSIS — Z981 Arthrodesis status: Secondary | ICD-10-CM

## 2024-12-05 DIAGNOSIS — Z888 Allergy status to other drugs, medicaments and biological substances status: Secondary | ICD-10-CM

## 2024-12-05 DIAGNOSIS — I493 Ventricular premature depolarization: Secondary | ICD-10-CM | POA: Diagnosis present

## 2024-12-05 DIAGNOSIS — N281 Cyst of kidney, acquired: Secondary | ICD-10-CM | POA: Diagnosis present

## 2024-12-05 DIAGNOSIS — J189 Pneumonia, unspecified organism: Secondary | ICD-10-CM | POA: Diagnosis present

## 2024-12-05 DIAGNOSIS — R0782 Intercostal pain: Secondary | ICD-10-CM | POA: Diagnosis not present

## 2024-12-05 DIAGNOSIS — J984 Other disorders of lung: Secondary | ICD-10-CM | POA: Diagnosis present

## 2024-12-05 DIAGNOSIS — R112 Nausea with vomiting, unspecified: Secondary | ICD-10-CM | POA: Diagnosis not present

## 2024-12-05 LAB — CBC
HCT: 27.1 % — ABNORMAL LOW (ref 39.0–52.0)
Hemoglobin: 8.9 g/dL — ABNORMAL LOW (ref 13.0–17.0)
MCH: 31.6 pg (ref 26.0–34.0)
MCHC: 32.8 g/dL (ref 30.0–36.0)
MCV: 96.1 fL (ref 80.0–100.0)
Platelets: 248 K/uL (ref 150–400)
RBC: 2.82 MIL/uL — ABNORMAL LOW (ref 4.22–5.81)
RDW: 25.9 % — ABNORMAL HIGH (ref 11.5–15.5)
WBC: 5.8 K/uL (ref 4.0–10.5)
nRBC: 0 % (ref 0.0–0.2)

## 2024-12-05 LAB — COMPREHENSIVE METABOLIC PANEL WITH GFR
ALT: 12 U/L (ref 0–44)
AST: 17 U/L (ref 15–41)
Albumin: 4 g/dL (ref 3.5–5.0)
Alkaline Phosphatase: 84 U/L (ref 38–126)
Anion gap: 10 (ref 5–15)
BUN: 13 mg/dL (ref 8–23)
CO2: 20 mmol/L — ABNORMAL LOW (ref 22–32)
Calcium: 9 mg/dL (ref 8.9–10.3)
Chloride: 107 mmol/L (ref 98–111)
Creatinine, Ser: 1.09 mg/dL (ref 0.61–1.24)
GFR, Estimated: >60 mL/min
Glucose, Bld: 92 mg/dL (ref 70–99)
Potassium: 4.5 mmol/L (ref 3.5–5.1)
Sodium: 137 mmol/L (ref 135–145)
Total Bilirubin: 1.6 mg/dL — ABNORMAL HIGH (ref 0.0–1.2)
Total Protein: 6.7 g/dL (ref 6.5–8.1)

## 2024-12-05 LAB — I-STAT CG4 LACTIC ACID, ED: Lactic Acid, Venous: 1.2 mmol/L (ref 0.5–1.9)

## 2024-12-05 LAB — TROPONIN T, HIGH SENSITIVITY
Troponin T High Sensitivity: 30 ng/L — ABNORMAL HIGH (ref 0–19)
Troponin T High Sensitivity: 32 ng/L — ABNORMAL HIGH (ref 0–19)

## 2024-12-05 LAB — PROCALCITONIN: Procalcitonin: <0.1 ng/mL

## 2024-12-05 LAB — MAGNESIUM: Magnesium: 1.6 mg/dL — ABNORMAL LOW (ref 1.7–2.4)

## 2024-12-05 LAB — PRO BRAIN NATRIURETIC PEPTIDE: Pro Brain Natriuretic Peptide: 4689 pg/mL — ABNORMAL HIGH

## 2024-12-05 LAB — PROTIME-INR
INR: 1.6 — ABNORMAL HIGH (ref 0.8–1.2)
Prothrombin Time: 19.8 s — ABNORMAL HIGH (ref 11.4–15.2)

## 2024-12-05 LAB — PHOSPHORUS: Phosphorus: 2.7 mg/dL (ref 2.5–4.6)

## 2024-12-05 MED ORDER — SODIUM CHLORIDE 0.9% FLUSH
3.0000 mL | Freq: Two times a day (BID) | INTRAVENOUS | Status: DC
  Administered 2024-12-05 – 2024-12-14 (×15): 3 mL via INTRAVENOUS

## 2024-12-05 MED ORDER — ACETAMINOPHEN 325 MG PO TABS
650.0000 mg | ORAL_TABLET | Freq: Four times a day (QID) | ORAL | Status: DC | PRN
  Administered 2024-12-07: 650 mg via ORAL
  Filled 2024-12-05: qty 2

## 2024-12-05 MED ORDER — SODIUM CHLORIDE 0.9 % IV SOLN
2.0000 g | Freq: Once | INTRAVENOUS | Status: AC
  Administered 2024-12-05: 2 g via INTRAVENOUS
  Filled 2024-12-05: qty 20

## 2024-12-05 MED ORDER — FOLIC ACID 1 MG PO TABS
1.0000 mg | ORAL_TABLET | Freq: Every day | ORAL | Status: DC
  Administered 2024-12-06 – 2024-12-14 (×9): 1 mg via ORAL
  Filled 2024-12-05 (×4): qty 1

## 2024-12-05 MED ORDER — ONDANSETRON HCL 4 MG PO TABS: 4.0000 mg | ORAL_TABLET | Freq: Four times a day (QID) | ORAL | Status: DC | PRN

## 2024-12-05 MED ORDER — LACTATED RINGERS IV SOLN: INTRAVENOUS | Status: DC

## 2024-12-05 MED ORDER — SODIUM CHLORIDE 0.9 % IV SOLN
2.0000 g | INTRAVENOUS | Status: DC
  Administered 2024-12-06 – 2024-12-08 (×3): 2 g via INTRAVENOUS
  Filled 2024-12-05 (×3): qty 20

## 2024-12-05 MED ORDER — FENTANYL CITRATE (PF) 50 MCG/ML IJ SOSY
50.0000 ug | PREFILLED_SYRINGE | Freq: Once | INTRAMUSCULAR | Status: AC
  Administered 2024-12-05: 50 ug via INTRAVENOUS
  Filled 2024-12-05: qty 1

## 2024-12-05 MED ORDER — HYDRALAZINE HCL 20 MG/ML IJ SOLN: 10.0000 mg | INTRAMUSCULAR | Status: DC | PRN

## 2024-12-05 MED ORDER — SODIUM CHLORIDE 0.9% FLUSH
3.0000 mL | Freq: Two times a day (BID) | INTRAVENOUS | Status: DC
  Administered 2024-12-06 – 2024-12-13 (×11): 3 mL via INTRAVENOUS

## 2024-12-05 MED ORDER — HEPARIN SODIUM (PORCINE) 5000 UNIT/ML IJ SOLN
5000.0000 [IU] | Freq: Three times a day (TID) | INTRAMUSCULAR | Status: DC
  Administered 2024-12-05: 5000 [IU] via SUBCUTANEOUS
  Filled 2024-12-05: qty 1

## 2024-12-05 MED ORDER — IPRATROPIUM BROMIDE 0.02 % IN SOLN: 0.5000 mg | Freq: Four times a day (QID) | RESPIRATORY_TRACT | Status: DC | PRN

## 2024-12-05 MED ORDER — FLEET ENEMA RE ENEM: 1.0000 | ENEMA | Freq: Once | RECTAL | Status: DC | PRN

## 2024-12-05 MED ORDER — NITROGLYCERIN 0.4 MG SL SUBL
0.4000 mg | SUBLINGUAL_TABLET | SUBLINGUAL | Status: DC | PRN
  Administered 2024-12-07: 0.4 mg via SUBLINGUAL
  Filled 2024-12-05 (×2): qty 1

## 2024-12-05 MED ORDER — SODIUM CHLORIDE 0.9 % IV SOLN
500.0000 mg | Freq: Once | INTRAVENOUS | Status: AC
  Administered 2024-12-05: 500 mg via INTRAVENOUS
  Filled 2024-12-05: qty 5

## 2024-12-05 MED ORDER — ONDANSETRON HCL 4 MG/2ML IJ SOLN
4.0000 mg | Freq: Four times a day (QID) | INTRAMUSCULAR | Status: DC | PRN
  Administered 2024-12-05 – 2024-12-14 (×8): 4 mg via INTRAVENOUS
  Filled 2024-12-05 (×5): qty 2

## 2024-12-05 MED ORDER — OXYCODONE HCL 5 MG PO TABS
5.0000 mg | ORAL_TABLET | ORAL | Status: DC | PRN
  Administered 2024-12-08 – 2024-12-12 (×2): 5 mg via ORAL
  Filled 2024-12-05 (×3): qty 1

## 2024-12-05 MED ORDER — ACETAMINOPHEN 650 MG RE SUPP: 650.0000 mg | Freq: Four times a day (QID) | RECTAL | Status: DC | PRN

## 2024-12-05 MED ORDER — FERROUS SULFATE 325 (65 FE) MG PO TABS
325.0000 mg | ORAL_TABLET | ORAL | Status: DC
  Administered 2024-12-06 – 2024-12-14 (×5): 325 mg via ORAL
  Filled 2024-12-05 (×2): qty 1

## 2024-12-05 MED ORDER — METOPROLOL TARTRATE 25 MG PO TABS: 25.0000 mg | ORAL_TABLET | Freq: Every day | ORAL | Status: DC | PRN

## 2024-12-05 MED ORDER — LACTATED RINGERS IV BOLUS (SEPSIS): 1000.0000 mL | Freq: Once | INTRAVENOUS | Status: DC

## 2024-12-05 MED ORDER — CHLORHEXIDINE GLUCONATE CLOTH 2 % EX PADS
6.0000 | MEDICATED_PAD | Freq: Every day | CUTANEOUS | Status: DC
  Administered 2024-12-05 – 2024-12-11 (×4): 6 via TOPICAL

## 2024-12-05 MED ORDER — FUROSEMIDE 10 MG/ML IJ SOLN
40.0000 mg | Freq: Two times a day (BID) | INTRAMUSCULAR | Status: DC
  Administered 2024-12-05 – 2024-12-06 (×2): 40 mg via INTRAVENOUS
  Filled 2024-12-05 (×2): qty 4

## 2024-12-05 MED ORDER — TRAZODONE HCL 50 MG PO TABS
25.0000 mg | ORAL_TABLET | Freq: Every evening | ORAL | Status: DC | PRN
  Administered 2024-12-09 – 2024-12-10 (×2): 25 mg via ORAL
  Filled 2024-12-05: qty 1

## 2024-12-05 MED ORDER — SENNOSIDES-DOCUSATE SODIUM 8.6-50 MG PO TABS: 1.0000 | ORAL_TABLET | Freq: Every evening | ORAL | Status: DC | PRN

## 2024-12-05 MED ORDER — MORPHINE SULFATE (PF) 2 MG/ML IV SOLN
2.0000 mg | INTRAVENOUS | Status: DC | PRN
  Administered 2024-12-05 – 2024-12-07 (×4): 2 mg via INTRAVENOUS
  Filled 2024-12-05 (×4): qty 1

## 2024-12-05 MED ORDER — BISACODYL 5 MG PO TBEC
5.0000 mg | DELAYED_RELEASE_TABLET | Freq: Every day | ORAL | Status: DC | PRN
  Administered 2024-12-13: 5 mg via ORAL

## 2024-12-05 MED ORDER — PROCHLORPERAZINE EDISYLATE 10 MG/2ML IJ SOLN
5.0000 mg | Freq: Once | INTRAMUSCULAR | Status: AC | PRN
  Administered 2024-12-05: 5 mg via INTRAVENOUS
  Filled 2024-12-05: qty 2

## 2024-12-05 MED ORDER — HYDROMORPHONE HCL 1 MG/ML IJ SOLN
0.5000 mg | INTRAMUSCULAR | Status: DC | PRN
  Administered 2024-12-05: 1 mg via INTRAVENOUS
  Filled 2024-12-05: qty 1

## 2024-12-05 MED ORDER — AZITHROMYCIN 250 MG PO TABS
500.0000 mg | ORAL_TABLET | Freq: Every day | ORAL | Status: DC
  Administered 2024-12-06 – 2024-12-08 (×3): 500 mg via ORAL
  Filled 2024-12-05 (×3): qty 2

## 2024-12-05 MED ORDER — HEPARIN (PORCINE) 25000 UT/250ML-% IV SOLN
1250.0000 [IU]/h | INTRAVENOUS | Status: DC
  Administered 2024-12-05: 950 [IU]/h via INTRAVENOUS
  Administered 2024-12-06: 1250 [IU]/h via INTRAVENOUS
  Filled 2024-12-05 (×2): qty 250

## 2024-12-05 MED ORDER — LACTATED RINGERS IV BOLUS
1000.0000 mL | Freq: Once | INTRAVENOUS | Status: AC
  Administered 2024-12-05: 1000 mL via INTRAVENOUS

## 2024-12-05 MED ORDER — CEFUROXIME AXETIL 500 MG PO TABS
500.0000 mg | ORAL_TABLET | Freq: Two times a day (BID) | ORAL | Status: DC
  Filled 2024-12-05: qty 1

## 2024-12-05 NOTE — H&P (Signed)
 " History and Physical   Patient: Todd Salazar                            PCP: Patient, No Pcp Per                    DOB: [birth year]            DOA: 12/05/2024 FMW:[id number]             DOS: 12/05/2024, 6:19 PM  Patient, No Pcp Per  Patient coming from:   HOME  I have personally reviewed patient's medical records, in electronic medical records, including:  [place] link, and care everywhere.    Chief Complaint:   Chief Complaint  Patient presents with   Chest Pain    History of present illness:    Kashden Deboy is a 88 y.o. male with medical history significant of anemia CAD A-fib (has been off Eliquis  secondary to GI bleed), GI bleed, colon cancer, HLD, RLS, prostate cancer, IDA, restrictive lung disease, bronchiectasis, CKD stage III .[name]  Presenting with persistent chest pain, tightness addressed gets worse with exertion.  This also associated with shortness of breath.  Reporting of persistent chest pain, tightness, since Saturday mainly on left side does not radiate, no nausea no vomiting History of pericarditis in 2024 also known history of CAD. Was recently admitted and treated for pneumonia chest pain.  Was discharged on 12/21 He has completed his antibiotic course.   ED Evaluation: Blood pressure 115/77, pulse (!) 109, temperature 97.8 F (36.6 C), temperature source Oral, resp. rate 16, SpO2 97%. LABs: WBC 5.6, hemoglobin 8.9, INR 1.6, CO2 20, total bili 1.6,  BNP 4,689.0, troponin 30     Patient Denies having: Fever, Chills, Cough, SOB, Chest Pain, Abd pain, N/V/D, headache, dizziness, lightheadedness,  Dysuria, Joint pain, rash, open wounds  ED Course:   Blood pressure 115/77, pulse (!) 109, temperature 97.8 F (36.6 C), temperature source Oral, resp. rate 16, SpO2 97%. Abnormal labs;   Review of Systems: As per HPI, otherwise 10 point review of systems were negative.    ----------------------------------------------------------------------------------------------------------------------  Allergies[1]  Home MEDs:  Prior to Admission medications  Medication Sig Start Date End Date Taking? Authorizing Provider  azithromycin  (ZITHROMAX ) 500 MG tablet Take 1 tablet (500 mg total) by mouth daily for 4 days. 12/01/24 12/05/24 Yes Rojelio Nest, DO  cefdinir  (OMNICEF ) 300 MG capsule Take 1 capsule (300 mg total) by mouth 2 (two) times daily. 12/01/24  Yes Rojelio Nest, DO  [Paused] ELIQUIS  5 MG TABS tablet TAKE 1 TABLET BY MOUTH 2 TIMES A DAY Wait to take this until your doctor or other care provider tells you to start again. 10/14/24  Yes Camnitz, Soyla Lunger, MD  ferrous sulfate  325 (65 FE) MG tablet Take 325 mg by mouth every other day.   Yes [provider]  folic acid  (FOLVITE ) 1 MG tablet Take 1 tablet (1 mg total) by mouth daily. 12/01/24 12/31/24 Yes Rojelio Nest, DO  nitroGLYCERIN  (NITROSTAT ) 0.4 MG SL tablet Place 1 tablet (0.4 mg total) under the tongue every 5 (five) minutes as needed for chest pain. 09/18/24  Yes Leverne Charlies Helling, PA-C  metoprolol  tartrate (LOPRESSOR ) 25 MG tablet Take 1 tablet (25 mg total) by mouth daily as needed (PALPITATIONS LSTING LONGER THATN 30 MINUTES). 09/18/24   Leverne Charlies Helling, PA-C    PRN MEDs: acetaminophen  **OR** acetaminophen , bisacodyl , hydrALAZINE , ipratropium, metoprolol  tartrate,  morphine  injection, nitroGLYCERIN , ondansetron  **OR** ondansetron  (ZOFRAN ) IV, oxyCODONE , senna-docusate, traZODone   Past Medical History:  Diagnosis Date   Arthritis    Atrial fibrillation (HCC)    Cancer (HCC)    prostate   Dysrhythmia    afib   History of kidney stones     Past Surgical History:  Procedure Laterality Date   ATRIAL FIBRILLATION ABLATION N/A 10/29/2021   Procedure: ATRIAL FIBRILLATION ABLATION;  Surgeon: Inocencio Soyla Lunger, MD;  Location: [facility] INVASIVE CV LAB;  Service: Cardiovascular;  Laterality:  N/A;   ATRIAL FIBRILLATION ABLATION N/A 06/29/2023   Procedure: ATRIAL FIBRILLATION ABLATION;  Surgeon: Inocencio Soyla Lunger, MD;  Location: [facility] INVASIVE CV LAB;  Service: Cardiovascular;  Laterality: N/A;   Bone spur     CARDIOVERSION N/A 06/01/2022   Procedure: CARDIOVERSION;  Surgeon: Kate Lonni CROME, MD;  Location: [facility] ENDOSCOPY;  Service: Cardiovascular;  Laterality: N/A;   CHOLECYSTECTOMY     HERNIA REPAIR     JOINT REPLACEMENT     kidney stones     LAMINECTOMY WITH POSTERIOR LATERAL ARTHRODESIS LEVEL 1 Right 11/25/2019   Procedure: Laminectomy and Foraminotomy  Lumbar Four-Lumbar Five - right, with instrumented fusion;  Surgeon: Joshua Alm RAMAN, MD;  Location: [facility] OR;  Service: Neurosurgery;  Laterality: Right;  Laminectomy and Foraminotomy  Lumbar Four-Lumbar Five - right, with instrumented fusion   PROSTATE SURGERY     TEE WITHOUT CARDIOVERSION N/A 06/01/2022   Procedure: TRANSESOPHAGEAL ECHOCARDIOGRAM (TEE);  Surgeon: Kate Lonni CROME, MD;  Location: [facility] ENDOSCOPY;  Service: Cardiovascular;  Laterality: N/A;     reports that he quit smoking about 28 years ago. His smoking use included cigarettes. He has never used smokeless tobacco. He reports that he does not drink alcohol and does not use drugs.   Family History  Problem Relation Age of Onset   Hypertension Father    CAD Neg Hx    Heart failure Neg Hx    Stroke Neg Hx     Physical Exam:   Vitals:   12/05/24 1420 12/05/24 1610  BP: 91/71 115/77  Pulse: (!) 110 (!) 109  Resp: 16 16  Temp: 97.8 F (36.6 C)   TempSrc: Oral   SpO2: 97% 97%   Constitutional: NAD, calm, comfortable Eyes: PERRL, lids and conjunctivae normal ENMT: Mucous membranes are moist. Posterior pharynx clear of any exudate or lesions.Normal dentition.  Neck: normal, supple, no masses, no thyromegaly Respiratory: clear to auscultation bilaterally, no wheezing, no crackles. Normal respiratory effort. No accessory muscle use.   Cardiovascular: Regular rate and rhythm, no murmurs / rubs / gallops. No extremity edema. 2+ pedal pulses. No carotid bruits.  Abdomen: no tenderness, no masses palpated. No hepatosplenomegaly. Bowel sounds positive.  Musculoskeletal: no clubbing / cyanosis. No joint deformity upper and lower extremities. Good ROM, no contractures. Normal muscle tone.  Neurologic: CN II-XII grossly intact. Sensation intact, DTR normal. Strength 5/5 in all 4.  Psychiatric: Normal judgment and insight. Alert and oriented x 3. Normal mood.  Skin: no rashes, lesions, ulcers. No induration Decubitus/ulcers:  Wounds: per nursing documentation         Labs on admission:    I have personally reviewed following labs and imaging studies  CBC: Recent Labs  Lab 11/30/24 1703 12/01/24 0413 12/05/24 1521  WBC 7.1 6.2 5.8  NEUTROABS 4.0  --   --   HGB 8.9* 8.0* 8.9*  HCT 27.9* 25.5* 27.1*  MCV 97.9 98.5 96.1  PLT 307 243 248   Basic Metabolic  Panel: Recent Labs  Lab 11/30/24 1703 11/30/24 1932 11/30/24 2223 12/01/24 0413 12/05/24 1521  NA 142  --  141 142 137  K 5.4*  --  4.7 5.3* 4.5  CL 110  --  110 111 107  CO2 24  --  22 25 20*  GLUCOSE 85  --  102* 95 92  BUN 19  --  17 16 13   CREATININE 1.18  --  1.13 1.12 1.09  CALCIUM  9.5  --  8.8* 8.9 9.0  MG  --  1.9  --  2.0  --   PHOS  --  2.5  --  3.2  --    GFR: Estimated Creatinine Clearance: 54.9 mL/min (by C-G formula based on SCr of 1.09 mg/dL). Liver Function Tests: Recent Labs  Lab 11/30/24 1703 12/01/24 0413 12/05/24 1521  AST 19 16 17   ALT 16 12 12   ALKPHOS 84 88 84  BILITOT 1.3* 1.2 1.6*  PROT 7.3 6.5 6.7  ALBUMIN 4.3 3.9 4.0   No results for input(s): LIPASE, AMYLASE in the last 168 hours. No results for input(s): AMMONIA in the last 168 hours. Coagulation Profile: Recent Labs  Lab 12/05/24 1521  INR 1.6*   Cardiac Enzymes: Recent Labs  Lab 11/30/24 1932  CKTOTAL <10*   BNP (last 3 results) Recent Labs     11/30/24 1703 12/05/24 1522  PROBNP 546.0* 4,689.0*   HbA1C: No results for input(s): HGBA1C in the last 72 hours. CBG: No results for input(s): GLUCAP in the last 168 hours. Lipid Profile: No results for input(s): CHOL, HDL, LDLCALC, TRIG, CHOLHDL, LDLDIRECT in the last 72 hours. Thyroid  Function Tests: No results for input(s): TSH, T4TOTAL, FREET4, T3FREE, THYROIDAB in the last 72 hours. Anemia Panel: No results for input(s): VITAMINB12, FOLATE, FERRITIN, TIBC, IRON, RETICCTPCT in the last 72 hours. Urine analysis:    Component Value Date/Time   COLORURINE YELLOW 04/15/2022 2250   APPEARANCEUR CLEAR 04/15/2022 2250   LABSPEC 1.016 04/15/2022 2250   PHURINE 6.0 04/15/2022 2250   GLUCOSEU NEGATIVE 04/15/2022 2250   HGBUR NEGATIVE 04/15/2022 2250   BILIRUBINUR NEGATIVE 04/15/2022 2250   KETONESUR NEGATIVE 04/15/2022 2250   PROTEINUR NEGATIVE 04/15/2022 2250   UROBILINOGEN 1.0 12/11/2011 0005   NITRITE NEGATIVE 04/15/2022 2250   LEUKOCYTESUR NEGATIVE 04/15/2022 2250    Last A1C:  No results found for: HGBA1C   Radiologic Exams on Admission:   DG Chest Portable 1 View Result Date: 12/05/2024 EXAM: 1 VIEW(S) XRAY OF THE CHEST 12/05/2024 02:28:00 PM COMPARISON: CT Angio Chest dated 11/30/2024,  CT angio chest 05/30/22, cxr 07/10/23. CLINICAL HISTORY: CP. FINDINGS: LINES, TUBES AND DEVICES: Overlying wires noted. Left chest dual-chamber pacemaker with leads projecting over right atrium and ventricle. LUNGS AND PLEURA: Persistent right hilar consolidation. No pleural effusion. No pneumothorax. HEART AND MEDIASTINUM: No acute abnormality of the cardiac and mediastinal silhouettes. BONES AND SOFT TISSUES: No acute osseous abnormality. IMPRESSION: 1. Persistent right hilar consolidation that correlates with hilar lymph nodes/soft tissues density on prior CT angio chests. Electronically signed by: Morgane Naveau MD 12/05/2024 02:59 PM EST RP  Workstation: [id number]    EKG:   Independently reviewed.  Orders placed or performed during the hospital encounter of 12/05/24   ED EKG   ED EKG   EKG 12-Lead   ---------------------------------------------------------------------------------------------------------------------------------------    Assessment / Plan:   Principal Problem:   Chest pain Active Problems:   CAD (coronary artery disease), native coronary artery   Carcinoma of prostate (HCC)  CAP (community acquired pneumonia)   Macrocytic anemia   Anxiety   Current use of long term anticoagulation   Acute respiratory failure with hypoxia (HCC)   Elevated brain natriuretic peptide (BNP) level   HTN (hypertension)   PAF (paroxysmal atrial fibrillation) (HCC)   CKD-3A   Pacemaker   Assessment and Plan: * Chest pain Atypical chest pain since Saturday -Nonspecific EKG changes in A-fib -Has been off Eliquis -per patient he has restarted yesterday -Mildly elevated troponin at 30 >> -Complaining of persistent left-sided chest pain with no radiation consistent with mild shortness of breath -Discussed the case with cardiology Dr. Cherrie Recommended recycling cardiac enzymes, Heparin  drip for now. - Will continue with the as needed nitroglycerin , morphine , - Will monitor closely in stepdown unit   - Significant history of pericarditis in 2024, CAD, with pacemaker Appreciate further evaluation by cardiology team  CAP (community acquired pneumonia) Recent diagnosis of hospitalization for pneumonia, completed a course of antibiotics Resenting once again with shortness of breath, chest x-ray still consistent with infiltrate, but patient remained afebrile, with no leukocytosis -continue empiric antibiotics initiated in ED - Anticipating quick de-escalation of antibiotics in next 24-48 hours -Will follow-up with labs, cultures, - Obtaining procalcitonin level,  Carcinoma of prostate (HCC) In remission,  follow-up as an outpatient  CAD (coronary artery disease), native coronary artery Extensive history of coronary artery disease, A-fib, with history of ablation, pacemaker in place -Reviewing home medications-pending confirmation Continue metoprolol , as needed nitroglycerin , holding Eliquis , continue heparin  drip for now -Patient's home medications not reflecting statins, obtaining lipid panel, LDL level  Elevated brain natriuretic peptide (BNP) level Currently no history of CHF -BNP 4,689.0 -No significant congestion on imaging -Bilateral lower extremity edema -Initiating IV Lasix  40 mg twice daily - Will continue to monitor daily weight, I's and O's  Echocardiogram from 12/01/2024 reviewed in detail EF 60-65%, Normal LV function, mildly enlarged right ventricle, valves within normal limits no mention signs of pericarditis or pericardial effusion - Appreciate cardiology input  Acute respiratory failure with hypoxia (HCC) - With significant history restrictive lung disease -Chest x-ray reviewed:  Persistent right hilar consolidation that correlates with hilar lymph nodes/soft tissues density on prior CT angio chests - Continue DuoNebs scheduled and as needed - Pending O2 sat greater 90% with supplemental oxygen  -Continue empiric IV antibiotic for presumed pneumonia -Will follow-up with labs, procalcitonin level, will de-escalate antibiotics accordingly (Recent treatment for pneumonia-completed a course few days ago)  Current use of long term anticoagulation For history of A-fib-Eliquis  has been held due to GI bleed Patient reports he took a dose yesterday Initiating heparin  drip for now-   Anxiety Reviewing home medication, resuming accordingly -As needed Xanax   Macrocytic anemia - May have chronic disease including microcytic anemia, with iron deficiency, - recent history of GI bleed - Hemoglobin stable, will continue to monitor call, continue iron  supplements   Pacemaker Noted,  CKD-3A Currently at baseline, monitoring closely  PAF (paroxysmal atrial fibrillation) (HCC) - Chronic rate controlled with metoprolol , status post ablation x 2, Eliquis  on hold due to recent GI bleed, although took first dose yesterday -Monitoring closely -Patient will be briefly on heparin  drip till chest pain improves, ruling acute MI  HTN (hypertension) - Since last admission blood pressure has been borderline low -Not any antihypertensive medication with exception metoprolol  which will be continued -BP stable      Patient will be admitted to progressive unit as patient will be on heparin  drip, close heart monitoring, ruling out  acute MI, versus pericarditis, shortness of breath .SABRA         Consults called: Cardiology -------------------------------------------------------------------------------------------------------------------------------------------- DVT prophylaxis:  SCDs Start: 12/05/24 1648   Code Status:   Code Status: Full Code   Admission status: Patient will be admitted as Inpatient, with a greater than 2 midnight length of stay. Level of care: Stepdown   Family Communication:  none at bedside  (The above findings and plan of care has been discussed with patient in detail, the patient expressed understanding and agreement of above plan)  --------------------------------------------------------------------------------------------------------------------------------------------------  Disposition Plan:  Anticipated 1-2 days Status is: Inpatient Remains inpatient appropriate because: Needing IV heparin , IV diuretics     ----------------------------------------------------------------------------------------------------------------------------------------------------  Critical care time spent:  105  Min.  Was spent seeing and evaluating the patient, reviewing all medical records, drawn plan of care.  SIGNED: Adriana DELENA Grams, MD, FHM. FAAFP. [facility], Pager  (Please use [email or web address] to page/ or secure chat through epic) If 7PM-7AM, please contact night-coverage [email or web address],  12/05/2024, 6:19 PM     [1]  Allergies Allergen Reactions   Ciprofloxacin Anaphylaxis, Hives and Other (See Comments)    Dizziness, also   Tramadol Swelling and Other (See Comments)    Laryngeal edema   Duloxetine Other (See Comments)    Delusions    Gabapentin  Other (See Comments)    Affects eyesight and makes the patient very unsteady   Hydrocodone-Acetaminophen  Nausea And Vomiting   Nisoldipine Other (See Comments)    Pt unsure of reaction (Sular, for hypertension)   Benadryl [Diphenhydramine] Anxiety and Other (See Comments)    Panic attack    Methocarbamol Nausea Only   Ondansetron  Hcl Nausea Only   Sulfa Antibiotics Rash   "

## 2024-12-05 NOTE — Assessment & Plan Note (Addendum)
 Currently no history of CHF -BNP 4,689.0 -No significant congestion on imaging -Bilateral lower extremity edema -Initiating IV Lasix  40 mg twice daily - Will continue to monitor daily weight, I's and O's  Echocardiogram from 12/01/2024 reviewed in detail EF 60-65%, Normal LV function, mildly enlarged right ventricle, valves within normal limits no mention signs of pericarditis or pericardial effusion - Appreciate cardiology input

## 2024-12-05 NOTE — Assessment & Plan Note (Signed)
 Extensive history of coronary artery disease, A-fib, with history of ablation, pacemaker in place -Reviewing home medications-pending confirmation Continue metoprolol , as needed nitroglycerin , holding Eliquis , continue heparin  drip for now -Patient's home medications not reflecting statins, obtaining lipid panel, LDL level

## 2024-12-05 NOTE — Assessment & Plan Note (Signed)
 Recent diagnosis of hospitalization for pneumonia, completed a course of antibiotics Resenting once again with shortness of breath, chest x-ray still consistent with infiltrate, but patient remained afebrile, with no leukocytosis -continue empiric antibiotics initiated in ED - Anticipating quick de-escalation of antibiotics in next 24-48 hours -Will follow-up with labs, cultures, - Obtaining procalcitonin level,

## 2024-12-05 NOTE — Assessment & Plan Note (Signed)
 Noted,

## 2024-12-05 NOTE — Assessment & Plan Note (Signed)
-   Chronic rate controlled with metoprolol , status post ablation x 2, Eliquis  on hold due to recent GI bleed, although took first dose yesterday -Monitoring closely -Patient will be briefly on heparin  drip till chest pain improves, ruling acute MI

## 2024-12-05 NOTE — Assessment & Plan Note (Signed)
 Currently at baseline, monitoring closely

## 2024-12-05 NOTE — Assessment & Plan Note (Signed)
-   May have chronic disease including microcytic anemia, with iron deficiency, - recent history of GI bleed - Hemoglobin stable, will continue to monitor call, continue iron supplements

## 2024-12-05 NOTE — Progress Notes (Signed)
 Dr. Willette relayed plan to transfer if second troponin elevated. Next value remains flat. I have signed out to on call fellow to be aware of patient.  Team will see in AM at [facility]. If he ends up being transferred to [facility] overnight please notify the on call cardiology team.

## 2024-12-05 NOTE — Assessment & Plan Note (Signed)
-   With significant history restrictive lung disease -Chest x-ray reviewed:  Persistent right hilar consolidation that correlates with hilar lymph nodes/soft tissues density on prior CT angio chests - Continue DuoNebs scheduled and as needed - Pending O2 sat greater 90% with supplemental oxygen  -Continue empiric IV antibiotic for presumed pneumonia -Will follow-up with labs, procalcitonin level, will de-escalate antibiotics accordingly (Recent treatment for pneumonia-completed a course few days ago)

## 2024-12-05 NOTE — Assessment & Plan Note (Addendum)
 Atypical chest pain since Saturday -Nonspecific EKG changes in A-fib -Has been off Eliquis -per patient he has restarted yesterday -Mildly elevated troponin at 30 >> -Complaining of persistent left-sided chest pain with no radiation consistent with mild shortness of breath -Discussed the case with cardiology Dr. Cherrie Recommended recycling cardiac enzymes, Heparin  drip for now. - Will continue with the as needed nitroglycerin , morphine , - Will monitor closely in stepdown unit   - Significant history of pericarditis in 2024, CAD, with pacemaker Appreciate further evaluation by cardiology team

## 2024-12-05 NOTE — Assessment & Plan Note (Signed)
-   Since last admission blood pressure has been borderline low -Not any antihypertensive medication with exception metoprolol  which will be continued -BP stable

## 2024-12-05 NOTE — Assessment & Plan Note (Signed)
 Reviewing home medication, resuming accordingly -As needed Xanax 

## 2024-12-05 NOTE — Assessment & Plan Note (Signed)
-   In remission, follow-up as an outpatient ?

## 2024-12-05 NOTE — Progress Notes (Addendum)
 PHARMACY - ANTICOAGULATION CONSULT NOTE  Pharmacy Consult for heparin  Indication: chest pain/ACS  Allergies[1]  Patient Measurements:    Vital Signs: Temp: 97.8 F (36.6 C) (12/25 1420) Temp Source: Oral (12/25 1420) BP: 115/77 (12/25 1610) Pulse Rate: 109 (12/25 1610)  Labs: Recent Labs    12/05/24 1521  HGB 8.9*  HCT 27.1*  PLT 248  LABPROT 19.8*  INR 1.6*  CREATININE 1.09    Estimated Creatinine Clearance: 54.9 mL/min (by C-G formula based on SCr of 1.09 mg/dL).   Medical History: Past Medical History:  Diagnosis Date   Arthritis    Atrial fibrillation (HCC)    Cancer (HCC)    prostate   Dysrhythmia    afib   History of kidney stones     Medications:  Eliquis  5 mg BID on hold PTA in setting of GIB Pt reports resuming 12/25, took one dose around 10 am  Assessment: 88 year old male presenting with chest pain. Patient recently admitted for chest pain and shortness of breath, noted to have been off his Eliquis  for about a week at that time for GI bleeding. He was discharged 12/21 with instructions to continue holding his Eliquis  at that time. Patient reports resuming Eliquis  12/25 x 1 dose around 10 am. Pharmacy consulted for management of heparin  infusion for ACS.  Hgb noted to be low at 8.9, plt WNL. Pt received a dose of subcu heparin  5000 units at 1742.  Goal of Therapy:  Heparin  level 0.3 - 0.7 aPTT 66-102 seconds Monitor platelets by anticoagulation protocol: Yes   Plan:  -Defer initial heparin  bolus -Start heparin  infusion at 950 units/hr -Check 8 hour heparin  level and aPTT -Daily CBC   Stefano MARLA Bologna, PharmD, BCPS Clinical Pharmacist 12/05/2024 6:06 PM       [1]  Allergies Allergen Reactions   Ciprofloxacin Anaphylaxis, Hives and Other (See Comments)    Dizziness, also   Tramadol Swelling and Other (See Comments)    Laryngeal edema   Duloxetine Other (See Comments)    Delusions    Gabapentin  Other (See Comments)    Affects  eyesight and makes the patient very unsteady   Hydrocodone-Acetaminophen  Nausea And Vomiting   Nisoldipine Other (See Comments)    Pt unsure of reaction (Sular, for hypertension)   Benadryl [Diphenhydramine] Anxiety and Other (See Comments)    Panic attack    Methocarbamol Nausea Only   Ondansetron  Hcl Nausea Only   Sulfa Antibiotics Rash

## 2024-12-05 NOTE — Hospital Course (Addendum)
 Todd Salazar is a 87 y.o. male with medical history significant of anemia CAD A-fib (has been off Eliquis  secondary to GI bleed), GI bleed, colon cancer, HLD, RLS, prostate cancer, IDA, restrictive lung disease, bronchiectasis, CKD stage III .[name]  Presenting with persistent chest pain, tightness addressed gets worse with exertion.  This also associated with shortness of breath.  Reporting of persistent chest pain, tightness, since Saturday mainly on left side does not radiate, no nausea no vomiting History of pericarditis in 2024 also known history of CAD. Was recently admitted and treated for pneumonia chest pain.  Was discharged on 12/21 He has completed his antibiotic course.   ED Evaluation: Blood pressure 115/77, pulse (!) 109, temperature 97.8 F (36.6 C), temperature source Oral, resp. rate 16, SpO2 97%. LABs: WBC 5.6, hemoglobin 8.9, INR 1.6, CO2 20, total bili 1.6,  BNP 4,689.0, troponin 30

## 2024-12-05 NOTE — ED Triage Notes (Addendum)
 Pt BIB EMS from home, c/o 9 out of 10 chest pain. Recently treated for pneumonia. Sinus tach and Afib en route. Given 200 mcg total of fentanyl    BP 110/70 P 110 SpO2 99% 20 RAC

## 2024-12-05 NOTE — Assessment & Plan Note (Signed)
 For history of A-fib-Eliquis  has been held due to GI bleed Patient reports he took a dose yesterday Initiating heparin  drip for now-

## 2024-12-05 NOTE — Progress Notes (Signed)
 eLink Physician-Brief Progress Note Patient Name: Todd Salazar DOB: Nov 07, 1936 MRN: [id number]   Date of Service  12/05/2024  HPI/Events of Note  88 y.o. male with medical history significant of anemia CAD A-fib (has been off Eliquis  secondary to GI bleed), GI bleed, colon cancer, HLD, RLS, prostate cancer, IDA, restrictive lung disease, bronchiectasis, CKD stage III, recently discharged from hospital for Pneumonia on 21 st. EMS called: chest pain in afib  en route.   Camera: VS stable, on heparin  drip. Sats 90%, on nasal o2. MAP 78. Afebrile.  Data: reviewed.   eICU Interventions  Continue current plan of care, if worsening to call us , CCM to transfer to [facility].      Intervention Category Major Interventions: Other:;Arrhythmia - evaluation and management Evaluation Type: New Patient Evaluation  [name] 12/05/2024, 9:42 PM

## 2024-12-05 NOTE — ED Provider Notes (Signed)
 " [place] [facility] AT [facility] Provider Note   CSN: [id number] Arrival date & time: 12/05/24  1400     History  Chief Complaint  Patient presents with   Chest Pain    Todd Salazar is a 88 y.o. male with PMH as listed below who presents with CP. Patient actively vomiting on my assessment.  Complains of 9/10 chest pain.  Reports he was recently treated for pneumonia.  He states he is having severe pain and was treated with 200 mics of fentanyl  and route with EMS.  He was recently discharged from the hospital antibiotics and states he just finished them this morning.  He denies shortness of breath or cough but states that he is having the exact same pain he was having when he was diagnosed with pneumonia.  Reports that he did not feel short of Breath or have any cough at that time either, though per chart review from his H&P on 11/30/2024 he does note shortness of breath at that time.  He has a history of A-fib not on Eliquis  secondary to GI bleed..   Had CT PE on 11/30/24: 1. No pulmonary embolus. 2. Small to moderate size subpleural consolidation in the posterior right lower lobe, suspicious for pneumonia. 3. Mild patchy ground-glass opacity in the dependent left lower lobe, likely atelectasis. 4. Dilated main pulmonary artery, suggesting pulmonary arterial hypertension. Chronic cardiomegaly.  Had echo on 12/01/24: LVEF 60-65%, mildly enlarged RV  Past Medical History:  Diagnosis Date   Arthritis    Atrial fibrillation (HCC)    Cancer (HCC)    prostate   Dysrhythmia    afib   History of kidney stones        Home Medications Prior to Admission medications  Medication Sig Start Date End Date Taking? Authorizing Provider  azithromycin  (ZITHROMAX ) 500 MG tablet Take 1 tablet (500 mg total) by mouth daily for 4 days. 12/01/24 12/05/24  Rojelio Nest, DO  cefdinir  (OMNICEF ) 300 MG capsule Take 1 capsule (300 mg total) by mouth 2 (two) times daily.  12/01/24   Rojelio Nest, DO  [Paused] ELIQUIS  5 MG TABS tablet TAKE 1 TABLET BY MOUTH 2 TIMES A DAY Patient not taking: Reported on 11/30/2024 Wait to take this until your doctor or other care provider tells you to start again. 10/14/24   Camnitz, Soyla Lunger, MD  ferrous sulfate  325 (65 FE) MG tablet Take 325 mg by mouth every other day.    [provider]  folic acid  (FOLVITE ) 1 MG tablet Take 1 tablet (1 mg total) by mouth daily. 12/01/24 12/31/24  Rojelio Nest, DO  metoprolol  tartrate (LOPRESSOR ) 25 MG tablet Take 1 tablet (25 mg total) by mouth daily as needed (PALPITATIONS LSTING LONGER THATN 30 MINUTES). Patient taking differently: Take 25 mg by mouth daily as needed (for a heart rate greater than 135 BPM). 09/18/24   Ursuy, Renee Lynn, PA-C  nitroGLYCERIN  (NITROSTAT ) 0.4 MG SL tablet Place 1 tablet (0.4 mg total) under the tongue every 5 (five) minutes as needed for chest pain. 09/18/24   Leverne Charlies Helling, PA-C      Allergies    Ciprofloxacin, Tramadol, Duloxetine, Gabapentin , Hydrocodone-acetaminophen , Nisoldipine, Benadryl [diphenhydramine], Methocarbamol, Ondansetron  hcl, and Sulfa antibiotics    Review of Systems   Review of Systems A 10 point review of systems was performed and is negative unless otherwise reported in HPI.  Physical Exam Updated Vital Signs BP 115/77 (BP Location: Left Arm)   Pulse 109  Temp 97.8 F (Oral)   Resp 16   Ht 6' 3 (1.905 m)   Wt 75.4 kg   SpO2 97%   BMI 20.78 kg/m  Physical Exam General: Very uncomfortable appearing elderly male, lying in bed.  HEENT: PERRLA, Sclera anicteric, MMM, trachea midline.  Cardiology: Regular tachycardic rate and rhythm, no murmurs/rubs/gallops. BL radial and DP pulses equal bilaterally.  Resp: Normal respiratory rate and effort. CTAB, no wheezes, rhonchi, crackles.  Abd: Soft, non-tender, non-distended. No rebound tenderness or guarding.  GU: Deferred. MSK: No peripheral edema or signs of trauma.  Extremities without deformity or TTP. No cyanosis or clubbing. Skin: warm, dry.  Neuro: A&Ox4, CNs II-XII grossly intact. MAEs. Sensation grossly intact.  Psych: Normal mood and affect.   ED Results / Procedures / Treatments   Labs (all labs ordered are listed, but only abnormal results are displayed) Labs Reviewed  CBC - Abnormal; Notable for the following components:   RBC 2.82 (*)    Hemoglobin 8.9 (*)    HCT 27.1 (*)    RDW 25.9 (*)    All other components within normal limits  PROTIME-INR - Abnormal; Notable for the following components:   Prothrombin Time 19.8 (*)    INR 1.6 (*)    All other components within normal limits  COMPREHENSIVE METABOLIC PANEL WITH GFR - Abnormal; Notable for the following components:   CO2 20 (*)    Total Bilirubin 1.6 (*)    All other components within normal limits  PRO BRAIN NATRIURETIC PEPTIDE - Abnormal; Notable for the following components:   Pro Brain Natriuretic Peptide 4,689.0 (*)    All other components within normal limits  MAGNESIUM  - Abnormal; Notable for the following components:   Magnesium  1.6 (*)    All other components within normal limits  PRO BRAIN NATRIURETIC PEPTIDE - Abnormal; Notable for the following components:   Pro Brain Natriuretic Peptide 4,621.0 (*)    All other components within normal limits  HEPARIN  LEVEL (UNFRACTIONATED) - Abnormal; Notable for the following components:   Heparin  Unfractionated >1.10 (*)    All other components within normal limits  APTT - Abnormal; Notable for the following components:   aPTT 47 (*)    All other components within normal limits  APTT - Abnormal; Notable for the following components:   aPTT 59 (*)    All other components within normal limits  URINALYSIS, COMPLETE (UACMP) WITH MICROSCOPIC - Abnormal; Notable for the following components:   Color, Urine STRAW (*)    All other components within normal limits  HEPATIC FUNCTION PANEL - Abnormal; Notable for the following  components:   Bilirubin, Direct 0.6 (*)    All other components within normal limits  TROPONIN T, HIGH SENSITIVITY - Abnormal; Notable for the following components:   Troponin T High Sensitivity 30 (*)    All other components within normal limits  TROPONIN T, HIGH SENSITIVITY - Abnormal; Notable for the following components:   Troponin T High Sensitivity 32 (*)    All other components within normal limits  CULTURE, BLOOD (ROUTINE X 2)  CULTURE, BLOOD (ROUTINE X 2)  MAGNESIUM   LACTIC ACID, PLASMA  LACTIC ACID, PLASMA  URINALYSIS, W/ REFLEX TO CULTURE (INFECTION SUSPECTED)  I-STAT CG4 LACTIC ACID, ED  I-STAT CG4 LACTIC ACID, ED  I-STAT CG4 LACTIC ACID, ED    EKG EKG Interpretation Date/Time:  Thursday December 05 2024 14:23:54 EST Ventricular Rate:  115 PR Interval:  85 QRS Duration:  101 QT Interval:  334 QTC Calculation: 462 R Axis:   -10  Text Interpretation: Sinus or ectopic atrial tachycardia Abnormal R-wave progression, early transition Nonspecific T abnrm, anterolateral leads Minimal ST elevation, inferior leads Since last tracing rate faster Confirmed by Patt Alm DEL [id number]) on 12/06/2024 4:06:06 PM  Radiology Chest x-ray: 1. Persistent right hilar consolidation that correlates with hilar lymph nodes/soft tissues density on prior CT angio chests  Procedures Procedures    Medications Ordered in ED Medications  fentaNYL  (SUBLIMAZE ) injection 50 mcg (50 mcg Intravenous Given 12/05/24 1459)  lactated ringers  bolus 1,000 mL (0 mLs Intravenous Stopped 12/05/24 1609)  cefTRIAXone  (ROCEPHIN ) 2 g in sodium chloride  0.9 % 100 mL IVPB (0 g Intravenous Stopped 12/05/24 1530)  azithromycin  (ZITHROMAX ) 500 mg in sodium chloride  0.9 % 250 mL IVPB (0 mg Intravenous Stopped 12/05/24 1641)  prochlorperazine  (COMPAZINE ) injection 5 mg (5 mg Intravenous Given 12/05/24 2112)    ED Course/ Medical Decision Making/ A&P                          Medical Decision Making Amount  and/or Complexity of Data Reviewed Labs: ordered. Decision-making details documented in ED Course. Radiology: ordered. Decision-making details documented in ED Course.  Risk Prescription drug management. Decision regarding hospitalization.    This patient presents to the ED for concern of persistent chest pain with recently treated pneumonia, this involves an extensive number of treatment options, and is a complaint that carries with it a high risk of complications and morbidity.  I considered the following differential and admission for this acute, potentially life threatening condition.   MDM:    DDX for chest pain includes but is not limited to:  Consider persistent PNA, PTX, complication of PNA such as empyema or pleural effusion. EKG w/o sign of ACS or arrhythmia, pericarditis. His troponin is mildly elevated at 30, will trend. Off eliquis . CXR w/ persistent R hilar consolidation. Consider PE though feel lower likelihood at this point. Lower likelihood for AAS. No leukocytosis or lactic acidosis.  No concern for sepsis at this time.  He is anemic but consistent with baseline.  No report of shortness of breath and no obvious leg swelling.  No signs of DVT.  No signs of lower extremity edema or pulmonary edema on his chest x-ray though his BNP is mildly elevated.  Still does not report any shortness of breath.  Administered additional fentanyl , LR, ceftriaxone  and azithromycin .  He is requiring multiple rounds of IV pain medication.  He will require admission to the hospital.  Clinical Course as of 12/08/24 1339  Thu Dec 05, 2024  1555 DG Chest Portable 1 View 1. Persistent right hilar consolidation that correlates with hilar lymph nodes/soft tissues density on prior CT angio chests.   [HN]  1556 Hemoglobin(!): 8.9 C/w baseline [HN]  1556 WBC: 5.8 No leukocytosis [HN]  1556 Lactic Acid, Venous: 1.2 wnl [HN]    Clinical Course User Index [HN] Franklyn Sid SAILOR, MD    Labs: I  Ordered, and personally interpreted labs.  The pertinent results include:  those listed above  Imaging Studies ordered: I ordered imaging studies including CXR I independently visualized and interpreted imaging. I agree with the radiologist interpretation  Additional history obtained from chart review, EMS  Cardiac Monitoring: The patient was maintained on a cardiac monitor.  I personally viewed and interpreted the cardiac monitored which showed an underlying rhythm of: ST  Reevaluation: After the interventions noted above, I reevaluated  the patient and found that they have :stayed the same  Social Determinants of Health: Lives independently  Disposition:  admit to medicine  Co morbidities that complicate the patient evaluation  Past Medical History:  Diagnosis Date   Arthritis    Atrial fibrillation (HCC)    Cancer (HCC)    prostate   Dysrhythmia    afib   History of kidney stones      Medicines No orders of the defined types were placed in this encounter.   I have reviewed the patients home medicines and have made adjustments as needed  Problem List / ED Course: Problem List Items Addressed This Visit   None Visit Diagnoses       Atypical chest pain    -  Primary     Pneumonia of right lung due to infectious organism, unspecified part of lung       Relevant Medications   cefTRIAXone  (ROCEPHIN ) 2 g in sodium chloride  0.9 % 100 mL IVPB (Completed)   azithromycin  (ZITHROMAX ) 500 mg in sodium chloride  0.9 % 250 mL IVPB (Completed)   ipratropium (ATROVENT ) nebulizer solution 0.5 mg   azithromycin  (ZITHROMAX ) tablet 500 mg   cefTRIAXone  (ROCEPHIN ) 2 g in sodium chloride  0.9 % 100 mL IVPB   mupirocin  ointment (BACTROBAN ) 2 % 1 Application   promethazine  (PHENERGAN ) 12.5 mg in sodium chloride  0.9 % 50 mL IVPB (Completed)   fidaxomicin  (DIFICID ) tablet 200 mg   promethazine  (PHENERGAN ) 12.5 mg in sodium chloride  0.9 % 50 mL IVPB                   This  note was created using dictation software, which may contain spelling or grammatical errors.    Franklyn Sid SAILOR, MD 12/08/24 1357  "

## 2024-12-05 NOTE — Sepsis Progress Note (Signed)
 Sepsis protocol monitored by eLink

## 2024-12-06 ENCOUNTER — Ambulatory Visit: Payer: Self-pay | Admitting: Cardiology

## 2024-12-06 ENCOUNTER — Inpatient Hospital Stay (HOSPITAL_COMMUNITY)

## 2024-12-06 DIAGNOSIS — I4719 Other supraventricular tachycardia: Secondary | ICD-10-CM

## 2024-12-06 DIAGNOSIS — R072 Precordial pain: Secondary | ICD-10-CM

## 2024-12-06 DIAGNOSIS — R079 Chest pain, unspecified: Secondary | ICD-10-CM | POA: Diagnosis not present

## 2024-12-06 LAB — CUP PACEART REMOTE DEVICE CHECK
Battery Remaining Longevity: 80 mo
Battery Remaining Percentage: 63 %
Battery Voltage: 2.99 V
Brady Statistic AP VP Percent: 1 %
Brady Statistic AP VS Percent: 11 %
Brady Statistic AS VP Percent: 1 %
Brady Statistic AS VS Percent: 89 %
Brady Statistic RA Percent Paced: 10 %
Brady Statistic RV Percent Paced: 1 %
Date Time Interrogation Session: 20251225040014
Implantable Lead Connection Status: 753985
Implantable Lead Connection Status: 753985
Implantable Lead Implant Date: 20211011
Implantable Lead Implant Date: 20211011
Implantable Lead Location: 753859
Implantable Lead Location: 753860
Implantable Pulse Generator Implant Date: 20211011
Lead Channel Impedance Value: 360 Ohm
Lead Channel Impedance Value: 430 Ohm
Lead Channel Pacing Threshold Amplitude: 0.625 V
Lead Channel Pacing Threshold Amplitude: 0.875 V
Lead Channel Pacing Threshold Pulse Width: 0.5 ms
Lead Channel Pacing Threshold Pulse Width: 0.5 ms
Lead Channel Sensing Intrinsic Amplitude: 1 mV
Lead Channel Sensing Intrinsic Amplitude: 9.5 mV
Lead Channel Setting Pacing Amplitude: 0.875
Lead Channel Setting Pacing Amplitude: 2 V
Lead Channel Setting Pacing Pulse Width: 0.5 ms
Lead Channel Setting Sensing Sensitivity: 2 mV
Pulse Gen Model: 2272
Pulse Gen Serial Number: 3869163

## 2024-12-06 LAB — CBC
HCT: 30.8 % — ABNORMAL LOW (ref 39.0–52.0)
Hemoglobin: 10.2 g/dL — ABNORMAL LOW (ref 13.0–17.0)
MCH: 31.9 pg (ref 26.0–34.0)
MCHC: 33.1 g/dL (ref 30.0–36.0)
MCV: 96.3 fL (ref 80.0–100.0)
Platelets: 290 K/uL (ref 150–400)
RBC: 3.2 MIL/uL — ABNORMAL LOW (ref 4.22–5.81)
RDW: 25.9 % — ABNORMAL HIGH (ref 11.5–15.5)
WBC: 18.6 K/uL — ABNORMAL HIGH (ref 4.0–10.5)
nRBC: 0 % (ref 0.0–0.2)

## 2024-12-06 LAB — URINALYSIS, COMPLETE (UACMP) WITH MICROSCOPIC
Bacteria, UA: NONE SEEN
Bilirubin Urine: NEGATIVE
Glucose, UA: NEGATIVE mg/dL
Hgb urine dipstick: NEGATIVE
Ketones, ur: NEGATIVE mg/dL
Leukocytes,Ua: NEGATIVE
Nitrite: NEGATIVE
Protein, ur: NEGATIVE mg/dL
Specific Gravity, Urine: 1.006 (ref 1.005–1.030)
pH: 5 (ref 5.0–8.0)

## 2024-12-06 LAB — LACTIC ACID, PLASMA
Lactic Acid, Venous: 1.6 mmol/L (ref 0.5–1.9)
Lactic Acid, Venous: 1.5 mmol/L (ref 0.5–1.9)

## 2024-12-06 LAB — BASIC METABOLIC PANEL WITH GFR
Anion gap: 15 (ref 5–15)
BUN: 14 mg/dL (ref 8–23)
CO2: 21 mmol/L — ABNORMAL LOW (ref 22–32)
Calcium: 9.4 mg/dL (ref 8.9–10.3)
Chloride: 104 mmol/L (ref 98–111)
Creatinine, Ser: 1.29 mg/dL — ABNORMAL HIGH (ref 0.61–1.24)
GFR, Estimated: 54 mL/min — ABNORMAL LOW
Glucose, Bld: 102 mg/dL — ABNORMAL HIGH (ref 70–99)
Potassium: 4.9 mmol/L (ref 3.5–5.1)
Sodium: 140 mmol/L (ref 135–145)

## 2024-12-06 LAB — HEPATIC FUNCTION PANEL
ALT: 11 U/L (ref 0–44)
AST: 15 U/L (ref 15–41)
Albumin: 4.3 g/dL (ref 3.5–5.0)
Alkaline Phosphatase: 100 U/L (ref 38–126)
Bilirubin, Direct: 0.6 mg/dL — ABNORMAL HIGH (ref 0.0–0.2)
Indirect Bilirubin: 0.6 mg/dL (ref 0.3–0.9)
Total Bilirubin: 1.2 mg/dL (ref 0.0–1.2)
Total Protein: 7.4 g/dL (ref 6.5–8.1)

## 2024-12-06 LAB — SEDIMENTATION RATE: Sed Rate: 22 mm/h — ABNORMAL HIGH (ref 0–16)

## 2024-12-06 LAB — HEPARIN LEVEL (UNFRACTIONATED): Heparin Unfractionated: >1.1 [IU]/mL — ABNORMAL HIGH (ref 0.30–0.70)

## 2024-12-06 LAB — MRSA NEXT GEN BY PCR, NASAL: MRSA by PCR Next Gen: DETECTED — AB

## 2024-12-06 LAB — PRO BRAIN NATRIURETIC PEPTIDE: Pro Brain Natriuretic Peptide: 4621 pg/mL — ABNORMAL HIGH

## 2024-12-06 LAB — C-REACTIVE PROTEIN: CRP: 5.2 mg/dL — ABNORMAL HIGH

## 2024-12-06 LAB — APTT
aPTT: 47 s — ABNORMAL HIGH (ref 24–36)
aPTT: 59 s — ABNORMAL HIGH (ref 24–36)

## 2024-12-06 LAB — MAGNESIUM: Magnesium: 2.4 mg/dL (ref 1.7–2.4)

## 2024-12-06 LAB — GLUCOSE, CAPILLARY: Glucose-Capillary: 94 mg/dL (ref 70–99)

## 2024-12-06 MED ORDER — PROMETHAZINE (PHENERGAN) 6.25MG IN NS 50ML IVPB
6.2500 mg | Freq: Once | INTRAVENOUS | Status: DC
  Filled 2024-12-06: qty 50

## 2024-12-06 MED ORDER — AMIODARONE HCL IN DEXTROSE 360-4.14 MG/200ML-% IV SOLN
30.0000 mg/h | INTRAVENOUS | Status: DC
  Administered 2024-12-06 – 2024-12-07 (×3): 30 mg/h via INTRAVENOUS
  Filled 2024-12-06 (×3): qty 200

## 2024-12-06 MED ORDER — MAGNESIUM SULFATE 2 GM/50ML IV SOLN
2.0000 g | Freq: Once | INTRAVENOUS | Status: AC
  Administered 2024-12-06: 2 g via INTRAVENOUS
  Filled 2024-12-06: qty 50

## 2024-12-06 MED ORDER — ORAL CARE MOUTH RINSE: 15.0000 mL | OROMUCOSAL | Status: DC | PRN

## 2024-12-06 MED ORDER — SODIUM CHLORIDE 0.9 % IV SOLN
12.5000 mg | Freq: Once | INTRAVENOUS | Status: AC
  Administered 2024-12-06: 12.5 mg via INTRAVENOUS
  Filled 2024-12-06: qty 12.5

## 2024-12-06 MED ORDER — SODIUM CHLORIDE 0.9 % IV SOLN: INTRAVENOUS | Status: AC

## 2024-12-06 MED ORDER — FUROSEMIDE 10 MG/ML IJ SOLN: 40.0000 mg | Freq: Every day | INTRAMUSCULAR | Status: DC

## 2024-12-06 MED ORDER — AMIODARONE LOAD VIA INFUSION
150.0000 mg | Freq: Once | INTRAVENOUS | Status: AC
  Administered 2024-12-06: 150 mg via INTRAVENOUS
  Filled 2024-12-06 (×2): qty 83.34

## 2024-12-06 MED ORDER — AMIODARONE HCL IN DEXTROSE 360-4.14 MG/200ML-% IV SOLN
60.0000 mg/h | INTRAVENOUS | Status: AC
  Administered 2024-12-06: 60 mg/h via INTRAVENOUS
  Filled 2024-12-06: qty 200

## 2024-12-06 MED ORDER — MUPIROCIN 2 % EX OINT
1.0000 | TOPICAL_OINTMENT | Freq: Two times a day (BID) | CUTANEOUS | Status: AC
  Administered 2024-12-06 – 2024-12-10 (×10): 1 via NASAL
  Filled 2024-12-06 (×2): qty 22

## 2024-12-06 NOTE — Consult Note (Addendum)
 "  Cardiology Consultation   Patient ID: Todd Salazar MRN: [id number]; DOB: 1936/03/12  Admit date: 12/05/2024 Date of Consult: 12/06/2024  PCP:  Patient, No Pcp Per   [facility] Providers Cardiologist:  None  Electrophysiologist:  Will Gladis Norton, MD       Patient Profile: Todd Salazar is a 88 y.o. male with a hx of persistent atrial fibrillation, atrial tachycardia, SSS s/p PPM, PVCs [11% burden], orthostatic hypotension, bronchiectasis/restrictive lung disease, prostate cancer, and CKD who is being seen 12/06/2024 for the evaluation of chest pain at the request of Lebron Cage MD.  History of Present Illness: [name] has followed closely with the EP team for his atrial fibrillation and SSS s/p PPM in 2021. He has failed multiple AADs and is intolerant of AV nodal blocking agents. PVI ablation in 2024. He was last seen by them 09/2024 and at that time was having episodes of palpitations, was given prn lopressor  and was to wear a 2 week heart monitor. He also reported atypical chest pain and an echo was ordered for further evaluation.  Both tests were unrevealing, unremarkable and could not explain his symptoms.   After his cardiology visit, patient was admitted to [facility] with a GI bleed. His eliquis  was stopped. He underwent EGD showing mild gastropathy, however he then developed neck pain. CT was concerning for possible esophageal perforation. He was transferred to [facility] for further evaluation. Work-up was negative and it was felt his neck pain was relation to positioning during EGD leading to neuropathy. At discharge eliquis  was resumed.   Recent admission 12/20 for pneumonia with associated chest pain and shortness of breath. Troponin 30s. ProBNP ~500 He was discharged home on 12/21.  Re-presented to the ED 12/25 for chest pain.  In the ED:  BP: 91/71 ECG:  Atrial Tachycardia TWI in V1-V2 and avL. VR 115 [TWI seen previously] CXR: chronic hilar consolidation.    Pertinent lab work: Hgb 8.9 -> 10.2 WBC 5.8 -> 18.6 Cr 1.09 -> 1.29 Magnesium  1.6 ProBNP 4600 Troponin 30 -> 32  He has been given antibiotics, IV lasix , and started on IV heparin .   On interview, patient shared last Sunday he developed chest heaviness centrally located. Reporting orthopnea and DOE. Decreased appetite. Chest pain is not associated with positional change or inspiration. No change with exertion. Reported improvement with pain medication.  Confirmed no prior history of MI, HF, or valvular issue.  Does not feel like his symptoms have improved with diuresis.  Denied cough, though reports maybe a fever prior to presenting yesterday because he felt cold.    Past Medical History:  Diagnosis Date   Arthritis    Atrial fibrillation (HCC)    Cancer (HCC)    prostate   Dysrhythmia    afib   History of kidney stones     Past Surgical History:  Procedure Laterality Date   ATRIAL FIBRILLATION ABLATION N/A 10/29/2021   Procedure: ATRIAL FIBRILLATION ABLATION;  Surgeon: Norton Soyla Gladis, MD;  Location: [facility] INVASIVE CV LAB;  Service: Cardiovascular;  Laterality: N/A;   ATRIAL FIBRILLATION ABLATION N/A 06/29/2023   Procedure: ATRIAL FIBRILLATION ABLATION;  Surgeon: Norton Soyla Gladis, MD;  Location: [facility] INVASIVE CV LAB;  Service: Cardiovascular;  Laterality: N/A;   Bone spur     CARDIOVERSION N/A 06/01/2022   Procedure: CARDIOVERSION;  Surgeon: Kate Lonni CROME, MD;  Location: [facility] ENDOSCOPY;  Service: Cardiovascular;  Laterality: N/A;   CHOLECYSTECTOMY     HERNIA REPAIR  JOINT REPLACEMENT     kidney stones     LAMINECTOMY WITH POSTERIOR LATERAL ARTHRODESIS LEVEL 1 Right 11/25/2019   Procedure: Laminectomy and Foraminotomy  Lumbar Four-Lumbar Five - right, with instrumented fusion;  Surgeon: Joshua Alm RAMAN, MD;  Location: [facility] OR;  Service: Neurosurgery;  Laterality: Right;  Laminectomy and Foraminotomy  Lumbar Four-Lumbar Five - right, with instrumented fusion    PROSTATE SURGERY     TEE WITHOUT CARDIOVERSION N/A 06/01/2022   Procedure: TRANSESOPHAGEAL ECHOCARDIOGRAM (TEE);  Surgeon: Kate Lonni CROME, MD;  Location: [facility] ENDOSCOPY;  Service: Cardiovascular;  Laterality: N/A;       Scheduled Meds:  azithromycin   500 mg Oral Daily   Chlorhexidine  Gluconate Cloth  6 each Topical Daily   ferrous sulfate   325 mg Oral QODAY   folic acid   1 mg Oral Daily   furosemide   40 mg Intravenous Q12H   mupirocin  ointment  1 Application Nasal BID   sodium chloride  flush  3 mL Intravenous Q12H   sodium chloride  flush  3 mL Intravenous Q12H   Continuous Infusions:  cefTRIAXone  (ROCEPHIN )  IV     heparin  1,100 Units/hr (12/06/24 0517)   magnesium  sulfate bolus IVPB     PRN Meds: acetaminophen  **OR** acetaminophen , bisacodyl , hydrALAZINE , ipratropium, metoprolol  tartrate, morphine  injection, nitroGLYCERIN , ondansetron  **OR** ondansetron  (ZOFRAN ) IV, mouth rinse, oxyCODONE , senna-docusate, traZODone   Allergies:   Allergies[1]  Social History:   Social History   Socioeconomic History   Marital status: Widowed    Spouse name: Not on file   Number of children: Not on file   Years of education: Not on file   Highest education level: Not on file  Occupational History   Not on file  Tobacco Use   Smoking status: Former    Current packs/day: 0.00    Types: Cigarettes    Quit date: 12/10/1996    Years since quitting: 28.0   Smokeless tobacco: Never   Tobacco comments:    Former smoker (08/27/2021)  Vaping Use   Vaping status: Never Used  Substance and Sexual Activity   Alcohol use: No   Drug use: No   Sexual activity: Never  Other Topics Concern   Not on file  Social History Narrative   Not on file   Social Drivers of Health   Tobacco Use: Medium Risk (12/05/2024)   Patient History    Smoking Tobacco Use: Former    Smokeless Tobacco Use: Never    Passive Exposure: Not on Actuary Strain: Not on file  Food Insecurity: No  Food Insecurity (12/05/2024)   Epic    Worried About Programme Researcher, Broadcasting/film/video in the Last Year: Never true    Ran Out of Food in the Last Year: Never true  Transportation Needs: No Transportation Needs (12/05/2024)   Epic    Lack of Transportation (Medical): No    Lack of Transportation (Non-Medical): No  Physical Activity: Not on file  Stress: Not on file  Social Connections: Moderately Integrated (12/05/2024)   Social Connection and Isolation Panel    Frequency of Communication with Friends and Family: More than three times a week    Frequency of Social Gatherings with Friends and Family: More than three times a week    Attends Religious Services: More than 4 times per year    Active Member of [employer] or Organizations: Yes    Attends Banker Meetings: More than 4 times per year    Marital Status: Widowed  Catering Manager  Violence: Not At Risk (12/05/2024)   Epic    Fear of Current or Ex-Partner: No    Emotionally Abused: No    Physically Abused: No    Sexually Abused: No  Depression (PHQ2-9): Not on file  Alcohol Screen: Not on file  Housing: Low Risk (12/05/2024)   Epic    Unable to Pay for Housing in the Last Year: No    Number of Times Moved in the Last Year: 0    Homeless in the Last Year: No  Utilities: Not At Risk (12/05/2024)   Epic    Threatened with loss of utilities: No  Health Literacy: Not on file    Family History:   Family History  Problem Relation Age of Onset   Hypertension Father    CAD Neg Hx    Heart failure Neg Hx    Stroke Neg Hx      ROS:  Please see the history of present illness.  All other ROS reviewed and negative.     Physical Exam/Data: Vitals:   12/06/24 0422 12/06/24 0500 12/06/24 0600 12/06/24 0700  BP:  (!) 112/52 (!) 109/53 108/65  Pulse:  (!) 115 (!) 114 (!) 114  Resp:  18 18 18   Temp:      TempSrc:      SpO2:  93% 94% 95%  Weight: 75.9 kg     Height:        Intake/Output Summary (Last 24 hours) at 12/06/2024  0751 Last data filed at 12/06/2024 0630 Gross per 24 hour  Intake 623.12 ml  Output 1205 ml  Net -581.88 ml      12/06/2024    4:22 AM 12/05/2024    9:43 PM 11/30/2024   10:03 PM  Last 3 Weights  Weight (lbs) 167 lb 5.3 oz 168 lb 14 oz 175 lb 14.8 oz  Weight (kg) 75.9 kg 76.6 kg 79.8 kg     Body mass index is 20.91 kg/m.  General:  Pleasant older male in no acute distress HEENT: normal Neck: no JVD Vascular:  Distal pulses 2+ bilaterally, though cold extremities Cardiac:  normal S1, S2; RRR; no murmur. Worse with palpation to chest wall.  Lungs:  clear to auscultation bilaterally, no wheezing, rhonchi or rales  Abd: soft, non-tender  Ext: no edema Psych:  Normal affect   EKG:  The EKG was personally reviewed and demonstrates:  See HPI Telemetry:  Telemetry was personally reviewed and demonstrates:  AT HR 115, occasional PVC  Relevant CV Studies: Echocardiogram 12/01/2024 IMPRESSIONS     1. Left ventricular ejection fraction, by estimation, is 60 to 65%. The  left ventricle has normal function. The left ventricle has no regional  wall motion abnormalities.   2. Right ventricular systolic function is normal. The right ventricular  size is mildly enlarged.   3. The mitral valve is normal in structure. No evidence of mitral valve  regurgitation.   4. The aortic valve is tricuspid. Aortic valve regurgitation is not  visualized.    Laboratory Data: High Sensitivity Troponin:   Recent Labs  Lab 11/30/24 1703 11/30/24 1932 12/05/24 1521 12/05/24 1848  TRNPT 36* 33* 30* 32*      Chemistry Recent Labs  Lab 11/30/24 1932 11/30/24 2223 12/01/24 0413 12/05/24 1521 12/05/24 1848 12/06/24 0410  NA  --    < > 142 137  --  140  K  --    < > 5.3* 4.5  --  4.9  CL  --    < >  111 107  --  104  CO2  --    < > 25 20*  --  21*  GLUCOSE  --    < > 95 92  --  102*  BUN  --    < > 16 13  --  14  CREATININE  --    < > 1.12 1.09  --  1.29*  CALCIUM   --    < > 8.9 9.0  --   9.4  MG 1.9  --  2.0  --  1.6*  --   GFRNONAA  --    < > >60 >60  --  54*  ANIONGAP  --    < > 7 10  --  15   < > = values in this interval not displayed.    Recent Labs  Lab 12/01/24 0413 12/05/24 1521 12/06/24 0410  PROT 6.5 6.7 7.4  ALBUMIN 3.9 4.0 4.3  AST 16 17 15   ALT 12 12 11   ALKPHOS 88 84 100  BILITOT 1.2 1.6* 1.2   Hematology Recent Labs  Lab 12/01/24 0413 12/05/24 1521 12/06/24 0410  WBC 6.2 5.8 18.6*  RBC 2.59* 2.82* 3.20*  HGB 8.0* 8.9* 10.2*  HCT 25.5* 27.1* 30.8*  MCV 98.5 96.1 96.3  MCH 30.9 31.6 31.9  MCHC 31.4 32.8 33.1  RDW 26.3* 25.9* 25.9*  PLT 243 248 290   BNP Recent Labs  Lab 11/30/24 1703 12/05/24 1522 12/06/24 0410  PROBNP 546.0* 4,689.0* 4,621.0*     Radiology/Studies:  DG Chest Portable 1 View Result Date: 12/05/2024 EXAM: 1 VIEW(S) XRAY OF THE CHEST 12/05/2024 02:28:00 PM COMPARISON: CT Angio Chest dated 11/30/2024,  CT angio chest 05/30/22, cxr 07/10/23. CLINICAL HISTORY: CP. FINDINGS: LINES, TUBES AND DEVICES: Overlying wires noted. Left chest dual-chamber pacemaker with leads projecting over right atrium and ventricle. LUNGS AND PLEURA: Persistent right hilar consolidation. No pleural effusion. No pneumothorax. HEART AND MEDIASTINUM: No acute abnormality of the cardiac and mediastinal silhouettes. BONES AND SOFT TISSUES: No acute osseous abnormality. IMPRESSION: 1. Persistent right hilar consolidation that correlates with hilar lymph nodes/soft tissues density on prior CT angio chests. Electronically signed by: Morgane Naveau MD 12/05/2024 02:59 PM EST RP Workstation: [id number]     Assessment and Plan:  Chest pain Described as heaviness with no exertional, positional, or pleuritic component.  Hx of pericarditis in 2024 following PVI ablation. Does not know if it feels similar.   ECG without acute ischemia findings Troponin elevation minimally elevated and flat, similar to several days ago. Pro- BNP Trend this week ~500 ->  4600 Been given IV diuresis without positive effect.  Cold on exam, though reports somewhat baseline with neuropathy.  Worse with palpation to chest wall.   At this time etiology is not exactly clear though given he has had constant chest pain with minimally elevated troponin unlikely ACS. His Pro-BNP trend could be respiratory related as his echo several days ago showed RV enlargement. CTA 12/20 without PE and did show PA enlargement. Doesn't look volume up on exam. Notable WB trend today with ongoing PNA.  Will order lactic acid. Will order inflammatory markers to rule out pericarditis with worsening leukocytosis.    Persistent Atrial Fibrillation Hx of Atrial Tachycardia SSS s/p PPM PVCs Currently in AT.  Chad Vasc score 3 Eliquis  was discontinued by EP team given ongoing issues and low AF burden. Patient restarted on 12/24 and has been on IV heparin  since admission.  Failed sotalol , flecainide , Tikosyn  and propafenone. PVI  in 2024. Has not tolerated AV nodal blocking agents previously, also BP low-normal this admission.   Would avoid AV nodal blocking agents with BP.   Start amiodarone   Currently on IV heparin , continue for now  Will update magnesium  level today  Orthostatic hypotension BP: 100/51 Continue to monitor  Per primary CAP Prostate cancer Acute respiratory failure with hypoxia Anxiety Anemia with recent GI bleed CKD  Risk Assessment/Risk Scores:        CHA2DS2-VASc Score = 3   This indicates a 3.2% annual risk of stroke. The patient's score is based upon: CHF History: 0 HTN History: 1 Diabetes History: 0 Stroke History: 0 Vascular Disease History: 0 Age Score: 2 Gender Score: 0        For questions or updates, please contact [facility] Please consult [email or web address] for contact info under      Signed, Leontine LOISE Salen, PA-C  12/06/2024 7:51 AM     [1]  Allergies Allergen Reactions   Ciprofloxacin Anaphylaxis, Hives and  Other (See Comments)    Dizziness, also   Tramadol Swelling and Other (See Comments)    Laryngeal edema   Duloxetine Other (See Comments)    Delusions    Gabapentin  Other (See Comments)    Affects eyesight and makes the patient very unsteady   Hydrocodone-Acetaminophen  Nausea And Vomiting   Nisoldipine Other (See Comments)    Pt unsure of reaction (Sular, for hypertension)   Benadryl [Diphenhydramine] Anxiety and Other (See Comments)    Panic attack    Methocarbamol Nausea Only   Ondansetron  Hcl Nausea Only   Sulfa Antibiotics Rash   "

## 2024-12-06 NOTE — Progress Notes (Signed)
 Remote PPM Transmission

## 2024-12-06 NOTE — Progress Notes (Signed)
 PHARMACY - ANTICOAGULATION CONSULT NOTE  Pharmacy Consult for heparin  Indication: chest pain/ACS  Allergies[1]  Patient Measurements: Height: 6' 3 (190.5 cm) Weight: 75.9 kg (167 lb 5.3 oz) IBW/kg (Calculated) : 84.5 HEPARIN  DW (KG): 76.6  Vital Signs: Temp: 98 F (36.7 C) (12/26 1140) Temp Source: Oral (12/26 1140) BP: 114/54 (12/26 1200) Pulse Rate: 114 (12/26 1200)  Labs: Recent Labs    12/05/24 1521 12/06/24 0410 12/06/24 1350  HGB 8.9* 10.2*  --   HCT 27.1* 30.8*  --   PLT 248 290  --   APTT  --  47* 59*  LABPROT 19.8*  --   --   INR 1.6*  --   --   HEPARINUNFRC  --  >1.10*  --   CREATININE 1.09 1.29*  --     Estimated Creatinine Clearance: 44.1 mL/min (A) (by C-G formula based on SCr of 1.29 mg/dL (H)).   Medical History: Past Medical History:  Diagnosis Date   Arthritis    Atrial fibrillation (HCC)    Cancer (HCC)    prostate   Dysrhythmia    afib   History of kidney stones     Medications:  Eliquis  5 mg BID on hold PTA in setting of GIB Pt reports resuming 12/25, took one dose around 10 am  Assessment: 88 year old male presenting with chest pain. Patient recently admitted for chest pain and shortness of breath, noted to have been off his Eliquis  for about a week at that time for GI bleeding. He was discharged 12/21 with instructions to continue holding his Eliquis  at that time. Patient reports resuming Eliquis  12/25 x 1 dose around 10 am. Pharmacy consulted for management of heparin  infusion for ACS.  12/06/2024 aPTT borderline low at 59 on 1,100 units/hr Hgb 10.2, plts 290 No bleeding noted per RN  Goal of Therapy:  Heparin  level 0.3 - 0.7 aPTT 66-102 seconds Monitor platelets by anticoagulation protocol: Yes   Plan:  Increase heparin  drip to 1,250 units/hr Check 8 hour aPTT Daily CBC  Rankin Dee, PharmD, BCPS, BCIDP Clinical Pharmacist 12/06/2024 2:33 PM         [1]  Allergies Allergen Reactions   Ciprofloxacin  Anaphylaxis, Hives and Other (See Comments)    Dizziness, also   Tramadol Swelling and Other (See Comments)    Laryngeal edema   Duloxetine Other (See Comments)    Delusions    Gabapentin  Other (See Comments)    Affects eyesight and makes the patient very unsteady   Hydrocodone-Acetaminophen  Nausea And Vomiting   Nisoldipine Other (See Comments)    Pt unsure of reaction (Sular, for hypertension)   Benadryl [Diphenhydramine] Anxiety and Other (See Comments)    Panic attack    Methocarbamol Nausea Only   Ondansetron  Hcl Nausea Only   Sulfa Antibiotics Rash

## 2024-12-06 NOTE — Progress Notes (Addendum)
 PHARMACY - ANTICOAGULATION CONSULT NOTE  Pharmacy Consult for heparin  Indication: chest pain/ACS  Allergies[1]  Patient Measurements: Height: 6' 3 (190.5 cm) Weight: 75.9 kg (167 lb 5.3 oz) IBW/kg (Calculated) : 84.5 HEPARIN  DW (KG): 76.6  Vital Signs: Temp: 98.2 F (36.8 C) (12/25 2143) Temp Source: Oral (12/25 2143) BP: 113/59 (12/26 0400) Pulse Rate: 114 (12/26 0400)  Labs: Recent Labs    12/05/24 1521 12/06/24 0410  HGB 8.9* 10.2*  HCT 27.1* 30.8*  PLT 248 290  APTT  --  47*  LABPROT 19.8*  --   INR 1.6*  --   HEPARINUNFRC  --  >1.10*  CREATININE 1.09  --     Estimated Creatinine Clearance: 52.2 mL/min (by C-G formula based on SCr of 1.09 mg/dL).   Medical History: Past Medical History:  Diagnosis Date   Arthritis    Atrial fibrillation (HCC)    Cancer (HCC)    prostate   Dysrhythmia    afib   History of kidney stones     Medications:  Eliquis  5 mg BID on hold PTA in setting of GIB Pt reports resuming 12/25, took one dose around 10 am  Assessment: 88 year old male presenting with chest pain. Patient recently admitted for chest pain and shortness of breath, noted to have been off his Eliquis  for about a week at that time for GI bleeding. He was discharged 12/21 with instructions to continue holding his Eliquis  at that time. Patient reports resuming Eliquis  12/25 x 1 dose around 10 am. Pharmacy consulted for management of heparin  infusion for ACS.  Hgb noted to be low at 8.9, plt WNL. Pt received a dose of subcu heparin  5000 units at 1742.  12/06/2024 aPTT 47 subtherapeutic on 950 units/hr Heparin  level > 1.1 as expected with recent apixaban  Hgb 10.2, plts 290 No bleeding noted per RN   Goal of Therapy:  Heparin  level 0.3 - 0.7 aPTT 66-102 seconds Monitor platelets by anticoagulation protocol: Yes   Plan:  - increase heparin  drip to 1100 units/hr - check 8 hour aPTT -Daily CBC   Leeroy Mace RPh 12/06/2024, 4:51 AM        [1]   Allergies Allergen Reactions   Ciprofloxacin Anaphylaxis, Hives and Other (See Comments)    Dizziness, also   Tramadol Swelling and Other (See Comments)    Laryngeal edema   Duloxetine Other (See Comments)    Delusions    Gabapentin  Other (See Comments)    Affects eyesight and makes the patient very unsteady   Hydrocodone-Acetaminophen  Nausea And Vomiting   Nisoldipine Other (See Comments)    Pt unsure of reaction (Sular, for hypertension)   Benadryl [Diphenhydramine] Anxiety and Other (See Comments)    Panic attack    Methocarbamol Nausea Only   Ondansetron  Hcl Nausea Only   Sulfa Antibiotics Rash

## 2024-12-06 NOTE — Progress Notes (Signed)
 OT Cancellation Note  Patient Details Name: Edoardo Laforte MRN: [id number] DOB: 11-Apr-1936   Cancelled Treatment:    Reason Eval/Treat Not Completed: Medical issues which prohibited therapy. Nurse recommended to hold therapy for now, due to pt presenting with general malaise.   Delanna JINNY Lesches, OTR/L 12/06/2024, 11:24 AM

## 2024-12-06 NOTE — Progress Notes (Signed)
 PT Cancellation Note  Patient Details Name: Todd Salazar MRN: [id number] DOB: [birth year]   Cancelled Treatment:    Reason Eval/Treat Not Completed: Medical issues which prohibited therapy, Per RN ,just had onset of  feeling poorly and recommends checking back another time. SABRA [name] PT Acute Rehabilitation Services Office [phone]   Potters [name] 12/06/2024, 9:48 AM

## 2024-12-06 NOTE — Progress Notes (Signed)
 "  PROGRESS NOTE  Todd Salazar FMW:[id number] DOB: 08/06/1936 DOA: 12/05/2024 PCP: Patient, No Pcp Per  HPI/Recap of past 24 hours: Todd Salazar is a 88 y.o. male with medical history significant of anemia, CAD, A-fib (has been off Eliquis  secondary to GI bleed), colon cancer, HLD, RLS, prostate cancer, restrictive lung disease, bronchiectasis, CKD stage III presenting with persistent left sided chest pain with dyspnea, ongoing for the past couple of weeks, now with nausea and recurrent vomiting for the past couple of days.  Of note, patient has been in and out of hospitals including [facility] as well as current health.  Last admission in 11/30/2024 for pneumonia and associated chest pain/shortness of breath, was discharged 12/21.  Presented again on 12/25 for persistent chest pain with orthopnea and dyspnea as well as decreased appetite with nausea and recurrent vomiting.  In the ED, VS fairly stable with some noted tachycardia. Labs showed fairly stable except for BNP 500-->4,689.0, troponin 30, flat trend.  Cardiology consulted.  Patient admitted for further management.    Today, patient is complaining of chest pain, still with nausea and vomiting.  Unable to tolerate orally, including morning meds.    Assessment/Plan: Principal Problem:   Chest pain Active Problems:   CAD (coronary artery disease), native coronary artery   Carcinoma of prostate (HCC)   CAP (community acquired pneumonia)   Macrocytic anemia   Anxiety   Current use of long term anticoagulation   Acute respiratory failure with hypoxia (HCC)   Elevated brain natriuretic peptide (BNP) level   HTN (hypertension)   PAF (paroxysmal atrial fibrillation) (HCC)   CKD-3A   Pacemaker   Atrial tachycardia   Chest pain History of CAD ??  Recurrent pneumonia, ?  Aspiration, ?  Pericarditis Noted restrictive lung disease Unknown etiology, unlikely ACS, rule out pericarditis given history Afebrile, with no  leukocytosis Troponin minimally elevated with flat trend EKG with no acute ST changes Echo done 12/01/2024 showed EF of 60 to 65%, no regional wall motion abnormality, right ventricular systolic function was normal BNP elevated  500-->4,689 LDL pending Chest x-ray negative for volume overload, questionable persistent pneumonia CRP 5.2, ESR 22 Procalcitonin, lactic acid WNL, will trend CT chest/abdomen pending SLP pending Cardiology consulted, appreciate recs Will hold off Lasix  for now as patient with emesis, soft BP, and does not appear volume overloaded Continue ceftriaxone , azithromycin  for now DuoNebs as needed Telemetry, monitor closely  Persistent A-fib SSS s/p PPM History of atrial tachycardia/PVCs Currently in atrial tachycardia History of ablation Currently rate uncontrolled Eliquis  was also discontinued by EP team, patient restarted on 12/24 Cardiology on board, continue IV heparin  for now, start amiodarone  drip Of note, patient has failed sotalol , flecainide , Tikosyn  and propafenone, has not also tolerated AV nodal blocking agents Hold Eliquis  Telemetry  HTN Currently BP soft Hold Lasix  for now, patient appears dry with recurrent vomiting Start gentle IV hydration Discontinue metoprolol   Recurrent nausea/vomiting Unclear etiology CT abdomen pending Continue gentle IV hydration, clear liquid diet, antiemetics   CKD stage IIIa Daily BMP  Normocytic anemia/anemia of chronic disease Folate deficiency Hemoglobin around baseline Anemia panel done 11/30/2024 showed iron 43, ferritin greater than 1000, folate 4, vitamin B12 greater than 1000 Continue folic acid  Daily CBC   Carcinoma of prostate In remission, follow-up as an outpatient        Wound 12/06/24 1323 Pressure Injury Perineum Medial Stage 2 -  Partial thickness loss of dermis presenting as a shallow open injury with a red,  pink wound bed without slough. (Active)          Estimated body  mass index is 20.91 kg/m as calculated from the following:   Height as of this encounter: 6' 3 (1.905 m).   Weight as of this encounter: 75.9 kg.     Code Status: Full  Family Communication: None at bedside  Disposition Plan: Status is: Inpatient Remains inpatient appropriate because: Level of care      Consultants: Cardiology  Procedures: None  Antimicrobials: Ceftriaxone , azithromycin   DVT prophylaxis: IV heparin    Objective: Vitals:   12/06/24 1100 12/06/24 1140 12/06/24 1200 12/06/24 1435  BP: (!) 107/53  (!) 114/54 95/62  Pulse: (!) 115  (!) 114 (!) 106  Resp: 19  16 16   Temp:  98 F (36.7 C)  98.6 F (37 C)  TempSrc:  Oral  Oral  SpO2: 94%  94% 98%  Weight:      Height:        Intake/Output Summary (Last 24 hours) at 12/06/2024 1647 Last data filed at 12/06/2024 1525 Gross per 24 hour  Intake 273.12 ml  Output 1525 ml  Net -1251.88 ml   Filed Weights   12/05/24 2143 12/06/24 0422  Weight: 76.6 kg 75.9 kg    Exam: General: NAD  Cardiovascular: S1, S2 present Respiratory: CTAB Abdomen: Soft, nontender, nondistended, bowel sounds present Musculoskeletal: No bilateral pedal edema noted Skin: Normal Psychiatry: Normal mood     Data Reviewed: CBC: Recent Labs  Lab 11/30/24 1703 12/01/24 0413 12/05/24 1521 12/06/24 0410  WBC 7.1 6.2 5.8 18.6*  NEUTROABS 4.0  --   --   --   HGB 8.9* 8.0* 8.9* 10.2*  HCT 27.9* 25.5* 27.1* 30.8*  MCV 97.9 98.5 96.1 96.3  PLT 307 243 248 290   Basic Metabolic Panel: Recent Labs  Lab 11/30/24 1703 11/30/24 1932 11/30/24 2223 12/01/24 0413 12/05/24 1521 12/05/24 1848 12/06/24 0410 12/06/24 1045  NA 142  --  141 142 137  --  140  --   K 5.4*  --  4.7 5.3* 4.5  --  4.9  --   CL 110  --  110 111 107  --  104  --   CO2 24  --  22 25 20*  --  21*  --   GLUCOSE 85  --  102* 95 92  --  102*  --   BUN 19  --  17 16 13   --  14  --   CREATININE 1.18  --  1.13 1.12 1.09  --  1.29*  --   CALCIUM  9.5   --  8.8* 8.9 9.0  --  9.4  --   MG  --  1.9  --  2.0  --  1.6*  --  2.4  PHOS  --  2.5  --  3.2  --  2.7  --   --    GFR: Estimated Creatinine Clearance: 44.1 mL/min (A) (by C-G formula based on SCr of 1.29 mg/dL (H)). Liver Function Tests: Recent Labs  Lab 11/30/24 1703 12/01/24 0413 12/05/24 1521 12/06/24 0410  AST 19 16 17 15   ALT 16 12 12 11   ALKPHOS 84 88 84 100  BILITOT 1.3* 1.2 1.6* 1.2  PROT 7.3 6.5 6.7 7.4  ALBUMIN 4.3 3.9 4.0 4.3   No results for input(s): LIPASE, AMYLASE in the last 168 hours. No results for input(s): AMMONIA in the last 168 hours. Coagulation Profile: Recent Labs  Lab 12/05/24 1521  INR 1.6*   Cardiac Enzymes: Recent Labs  Lab 11/30/24 1932  CKTOTAL <10*   BNP (last 3 results) Recent Labs    11/30/24 1703 12/05/24 1522 12/06/24 0410  PROBNP 546.0* 4,689.0* 4,621.0*   HbA1C: No results for input(s): HGBA1C in the last 72 hours. CBG: Recent Labs  Lab 12/06/24 0816  GLUCAP 94   Lipid Profile: No results for input(s): CHOL, HDL, LDLCALC, TRIG, CHOLHDL, LDLDIRECT in the last 72 hours. Thyroid  Function Tests: No results for input(s): TSH, T4TOTAL, FREET4, T3FREE, THYROIDAB in the last 72 hours. Anemia Panel: No results for input(s): VITAMINB12, FOLATE, FERRITIN, TIBC, IRON, RETICCTPCT in the last 72 hours. Urine analysis:    Component Value Date/Time   COLORURINE STRAW (A) 12/06/2024 0953   APPEARANCEUR CLEAR 12/06/2024 0953   LABSPEC 1.006 12/06/2024 0953   PHURINE 5.0 12/06/2024 0953   GLUCOSEU NEGATIVE 12/06/2024 0953   HGBUR NEGATIVE 12/06/2024 0953   BILIRUBINUR NEGATIVE 12/06/2024 0953   KETONESUR NEGATIVE 12/06/2024 0953   PROTEINUR NEGATIVE 12/06/2024 0953   UROBILINOGEN 1.0 12/11/2011 0005   NITRITE NEGATIVE 12/06/2024 0953   LEUKOCYTESUR NEGATIVE 12/06/2024 0953   Sepsis Labs: @LABRCNTIP (procalcitonin:4,lacticidven:4)  ) Recent Results (from the past 240 hours)  Resp  panel by RT-PCR (RSV, Flu A&B, Covid) Anterior Nasal Swab     Status: None   Collection Time: 11/30/24  5:10 PM   Specimen: Anterior Nasal Swab  Result Value Ref Range Status   SARS Coronavirus 2 by RT PCR NEGATIVE NEGATIVE Final    Comment: (NOTE) SARS-CoV-2 target nucleic acids are NOT DETECTED.  The SARS-CoV-2 RNA is generally detectable in upper respiratory specimens during the acute phase of infection. The lowest concentration of SARS-CoV-2 viral copies this assay can detect is 138 copies/mL. A negative result does not preclude SARS-Cov-2 infection and should not be used as the sole basis for treatment or other patient management decisions. A negative result may occur with  improper specimen collection/handling, submission of specimen other than nasopharyngeal swab, presence of viral mutation(s) within the areas targeted by this assay, and inadequate number of viral copies(<138 copies/mL). A negative result must be combined with clinical observations, patient history, and epidemiological information. The expected result is Negative.  Fact Sheet for Patients:  bloggercourse.com  Fact Sheet for Healthcare Providers:  seriousbroker.it  This test is no t yet approved or cleared by the [place]  FDA and  has been authorized for detection and/or diagnosis of SARS-CoV-2 by FDA under an Emergency Use Authorization (EUA). This EUA will remain  in effect (meaning this test can be used) for the duration of the COVID-19 declaration under Section 564(b)(1) of the Act, 21 U.S.C.section 360bbb-3(b)(1), unless the authorization is terminated  or revoked sooner.       Influenza A by PCR NEGATIVE NEGATIVE Final   Influenza B by PCR NEGATIVE NEGATIVE Final    Comment: (NOTE) The Xpert Xpress SARS-CoV-2/FLU/RSV plus assay is intended as an aid in the diagnosis of influenza from Nasopharyngeal swab specimens and should not be used as a  sole basis for treatment. Nasal washings and aspirates are unacceptable for Xpert Xpress SARS-CoV-2/FLU/RSV testing.  Fact Sheet for Patients: bloggercourse.com  Fact Sheet for Healthcare Providers: seriousbroker.it  This test is not yet approved or cleared by the [place]  FDA and has been authorized for detection and/or diagnosis of SARS-CoV-2 by FDA under an Emergency Use Authorization (EUA). This EUA will remain in effect (meaning this test can be used) for the duration of the COVID-19 declaration  under Section 564(b)(1) of the Act, 21 U.S.C. section 360bbb-3(b)(1), unless the authorization is terminated or revoked.     Resp Syncytial Virus by PCR NEGATIVE NEGATIVE Final    Comment: (NOTE) Fact Sheet for Patients: bloggercourse.com  Fact Sheet for Healthcare Providers: seriousbroker.it  This test is not yet approved or cleared by the [place]  FDA and has been authorized for detection and/or diagnosis of SARS-CoV-2 by FDA under an Emergency Use Authorization (EUA). This EUA will remain in effect (meaning this test can be used) for the duration of the COVID-19 declaration under Section 564(b)(1) of the Act, 21 U.S.C. section 360bbb-3(b)(1), unless the authorization is terminated or revoked.  Performed at [facility], [address], [place], [place] [address]   Blood Culture (routine x 2)     Status: None (Preliminary result)   Collection Time: 12/05/24  3:10 PM   Specimen: Right Antecubital; Blood  Result Value Ref Range Status   Specimen Description   Final    RIGHT ANTECUBITAL Performed at [facility], [address], [place], [place] [address]    Special Requests   Final    BOTTLES DRAWN AEROBIC AND ANAEROBIC Blood Culture adequate volume Performed at [facility], [address], [place],  [place] [address]    Culture   Final    NO GROWTH < 24 HOURS Performed at [facility], [address] [place], [place] [address]    Report Status PENDING  Incomplete  Blood Culture (routine x 2)     Status: None (Preliminary result)   Collection Time: 12/05/24  3:15 PM   Specimen: BLOOD RIGHT FOREARM  Result Value Ref Range Status   Specimen Description   Final    BLOOD RIGHT FOREARM Performed at [facility], [address], [place], [place] [address]    Special Requests   Final    BOTTLES DRAWN AEROBIC ONLY BLOOD RIGHT FOREARM Performed at [facility], [address] [place], [place] [address]    Culture   Final    NO GROWTH < 24 HOURS Performed at [facility], [address] [place], [place] [address]    Report Status PENDING  Incomplete  MRSA Next Gen by PCR, Nasal     Status: Abnormal   Collection Time: 12/05/24 10:08 PM   Specimen: Nasal Mucosa; Nasal Swab  Result Value Ref Range Status   MRSA by PCR Next Gen DETECTED (A) NOT DETECTED Final    Comment: RESULT CALLED TO, READ BACK BY AND VERIFIED WITH: JONETTA LESCHES, RN [phone] 12/06/24 BY ALONSO CORDS (NOTE) The GeneXpert MRSA Assay (FDA approved for NASAL specimens only), is one component of a comprehensive MRSA colonization surveillance program. It is not intended to diagnose MRSA infection nor to guide or monitor treatment for MRSA infections. Test performance is not FDA approved in patients less than 46 years old. Performed at [facility], [address] [place], [place] [address]       Studies: No results found.  Scheduled Meds:  azithromycin   500 mg Oral Daily   Chlorhexidine  Gluconate Cloth  6 each Topical Daily   ferrous sulfate   325 mg Oral QODAY   folic acid   1 mg Oral Daily   [START ON 12/07/2024] furosemide   40 mg Intravenous Daily   mupirocin  ointment  1 Application Nasal BID   sodium chloride  flush  3 mL Intravenous Q12H   sodium  chloride flush  3 mL Intravenous Q12H    Continuous Infusions:  amiodarone  30 mg/hr (12/06/24 1600)   cefTRIAXone  (ROCEPHIN )  IV     heparin  1,250 Units/hr (12/06/24 1509)     LOS: 1 day     Lebron JINNY Cage, MD [facility]  If 7PM-7AM, please contact night-coverage [email or web address] 12/06/2024, 4:47 PM    "

## 2024-12-07 DIAGNOSIS — I480 Paroxysmal atrial fibrillation: Secondary | ICD-10-CM | POA: Diagnosis not present

## 2024-12-07 DIAGNOSIS — I2510 Atherosclerotic heart disease of native coronary artery without angina pectoris: Secondary | ICD-10-CM | POA: Diagnosis not present

## 2024-12-07 DIAGNOSIS — R079 Chest pain, unspecified: Secondary | ICD-10-CM | POA: Diagnosis not present

## 2024-12-07 DIAGNOSIS — I4719 Other supraventricular tachycardia: Secondary | ICD-10-CM | POA: Diagnosis not present

## 2024-12-07 DIAGNOSIS — R0782 Intercostal pain: Secondary | ICD-10-CM | POA: Diagnosis not present

## 2024-12-07 DIAGNOSIS — R072 Precordial pain: Secondary | ICD-10-CM | POA: Diagnosis not present

## 2024-12-07 DIAGNOSIS — Z950 Presence of cardiac pacemaker: Secondary | ICD-10-CM | POA: Diagnosis not present

## 2024-12-07 LAB — LIPID PANEL
Cholesterol: 92 mg/dL (ref 0–200)
HDL: 36 mg/dL — ABNORMAL LOW
LDL Cholesterol: 38 mg/dL (ref 0–99)
Total CHOL/HDL Ratio: 2.6 ratio
Triglycerides: 88 mg/dL
VLDL: 18 mg/dL (ref 0–40)

## 2024-12-07 LAB — GASTROINTESTINAL PANEL BY PCR, STOOL (REPLACES STOOL CULTURE)

## 2024-12-07 LAB — CBC
HCT: 28.9 % — ABNORMAL LOW (ref 39.0–52.0)
Hemoglobin: 9.6 g/dL — ABNORMAL LOW (ref 13.0–17.0)
MCH: 32 pg (ref 26.0–34.0)
MCHC: 33.2 g/dL (ref 30.0–36.0)
MCV: 96.3 fL (ref 80.0–100.0)
Platelets: 247 K/uL (ref 150–400)
RBC: 3 MIL/uL — ABNORMAL LOW (ref 4.22–5.81)
RDW: 26 % — ABNORMAL HIGH (ref 11.5–15.5)
WBC: 16.2 K/uL — ABNORMAL HIGH (ref 4.0–10.5)
nRBC: 0.1 % (ref 0.0–0.2)

## 2024-12-07 LAB — BASIC METABOLIC PANEL WITH GFR
Anion gap: 13 (ref 5–15)
BUN: 24 mg/dL — ABNORMAL HIGH (ref 8–23)
CO2: 22 mmol/L (ref 22–32)
Calcium: 8.5 mg/dL — ABNORMAL LOW (ref 8.9–10.3)
Chloride: 103 mmol/L (ref 98–111)
Creatinine, Ser: 2.39 mg/dL — ABNORMAL HIGH (ref 0.61–1.24)
GFR, Estimated: 26 mL/min — ABNORMAL LOW
Glucose, Bld: 119 mg/dL — ABNORMAL HIGH (ref 70–99)
Potassium: 4.8 mmol/L (ref 3.5–5.1)
Sodium: 138 mmol/L (ref 135–145)

## 2024-12-07 LAB — C DIFFICILE QUICK SCREEN W PCR REFLEX
C Diff antigen: POSITIVE — AB
C Diff interpretation: DETECTED
C Diff toxin: POSITIVE — AB

## 2024-12-07 LAB — APTT
aPTT: 70 s — ABNORMAL HIGH (ref 24–36)
aPTT: 70 s — ABNORMAL HIGH (ref 24–36)

## 2024-12-07 LAB — PRO BRAIN NATRIURETIC PEPTIDE: Pro Brain Natriuretic Peptide: 6363 pg/mL — ABNORMAL HIGH

## 2024-12-07 LAB — GLUCOSE, CAPILLARY: Glucose-Capillary: 104 mg/dL — ABNORMAL HIGH (ref 70–99)

## 2024-12-07 MED ORDER — SODIUM CHLORIDE 0.9 % IV SOLN: INTRAVENOUS | Status: DC

## 2024-12-07 MED ORDER — PANTOPRAZOLE SODIUM 40 MG IV SOLR
40.0000 mg | INTRAVENOUS | Status: DC
  Administered 2024-12-07: 40 mg via INTRAVENOUS
  Filled 2024-12-07: qty 10

## 2024-12-07 MED ORDER — MORPHINE SULFATE (PF) 2 MG/ML IV SOLN
1.0000 mg | Freq: Once | INTRAVENOUS | Status: AC
  Administered 2024-12-07: 1 mg via INTRAVENOUS
  Filled 2024-12-07: qty 1

## 2024-12-07 MED ORDER — AMIODARONE HCL 200 MG PO TABS
400.0000 mg | ORAL_TABLET | Freq: Two times a day (BID) | ORAL | Status: DC
  Administered 2024-12-07: 400 mg via ORAL
  Filled 2024-12-07: qty 2

## 2024-12-07 MED ORDER — APIXABAN 2.5 MG PO TABS
2.5000 mg | ORAL_TABLET | Freq: Two times a day (BID) | ORAL | Status: DC
  Administered 2024-12-07 – 2024-12-08 (×4): 2.5 mg via ORAL
  Filled 2024-12-07 (×4): qty 1

## 2024-12-07 MED ORDER — FAMOTIDINE IN NACL 20-0.9 MG/50ML-% IV SOLN
20.0000 mg | Freq: Two times a day (BID) | INTRAVENOUS | Status: DC
  Administered 2024-12-07 – 2024-12-08 (×3): 20 mg via INTRAVENOUS
  Filled 2024-12-07 (×4): qty 50

## 2024-12-07 MED ORDER — SUCRALFATE 1 GM/10ML PO SUSP
1.0000 g | Freq: Three times a day (TID) | ORAL | Status: DC
  Administered 2024-12-07 – 2024-12-08 (×5): 1 g via ORAL
  Filled 2024-12-07 (×5): qty 10

## 2024-12-07 MED ORDER — FIDAXOMICIN 200 MG PO TABS
200.0000 mg | ORAL_TABLET | Freq: Two times a day (BID) | ORAL | Status: DC
  Administered 2024-12-07 – 2024-12-14 (×16): 200 mg via ORAL
  Filled 2024-12-07 (×4): qty 1

## 2024-12-07 NOTE — Progress Notes (Addendum)
 PHARMACY - ANTICOAGULATION CONSULT NOTE  Pharmacy Consult for heparin  >> Eliquis  Indication: chest pain/ACS  Allergies[1]  Patient Measurements: Height: 6' 3 (190.5 cm) Weight: 75.2 kg (165 lb 12.6 oz) IBW/kg (Calculated) : 84.5 HEPARIN  DW (KG): 76.6  Vital Signs: Temp: 98.4 F (36.9 C) (12/27 0554) Temp Source: Oral (12/27 0554) BP: 93/50 (12/27 0554) Pulse Rate: 70 (12/27 0554)  Labs: Recent Labs    12/05/24 1521 12/06/24 0410 12/06/24 1350 12/07/24 0008  HGB 8.9* 10.2*  --  9.6*  HCT 27.1* 30.8*  --  28.9*  PLT 248 290  --  247  APTT  --  47* 59* 70*  LABPROT 19.8*  --   --   --   INR 1.6*  --   --   --   HEPARINUNFRC  --  >1.10*  --   --   CREATININE 1.09 1.29*  --  2.39*    Estimated Creatinine Clearance: 23.6 mL/min (A) (by C-G formula based on SCr of 2.39 mg/dL (H)).   Medical History: Past Medical History:  Diagnosis Date   Arthritis    Atrial fibrillation (HCC)    Cancer (HCC)    prostate   Dysrhythmia    afib   History of kidney stones     Medications:  Eliquis  5 mg BID on hold PTA in setting of GIB Pt reports resuming 12/25, took one dose around 10 am  Assessment: 88 year old male presenting with chest pain. Patient recently admitted for chest pain and shortness of breath, noted to have been off his Eliquis  for about a week at that time for GI bleeding. He was discharged 12/21 with instructions to continue holding his Eliquis  at that time. Patient reports resuming Eliquis  12/25 x 1 dose around 10 am. Pharmacy consulted for management of heparin  infusion for ACS.  12/07/2024 aPTT remains 70 (therapeutic) on 1250 units/hr Hgb 9.6, plts 247--stable No bleeding noted Scr doubled 1.29 >> 2.39 Cardiology okay with transitioning back to Eliquis  today (history of afib) as chest discomfort thought to be more related to pneumonia or pericardial inflammation. MD okay with changing back to Eliquis  as well.   Goal of Therapy:  Heparin  level 0.3 -  0.7 aPTT 66-102 seconds Monitor platelets by anticoagulation protocol: Yes   Plan:  Stop heparin  Start Eliquis  2.5mg  PO BID given pt's age >12 + Scr >1.5 Noted that patient takes Eliquis  5mg  BID at home, so will monitor Scr closely to determine if dose increase to 5mg  BID is needed prior to discharge.  Continue to monitor for s/sx of bleeding, especially given his history of GI bleeding    Lacinda Moats, PharmD Clinical Pharmacist  12/27/20258:57 AM     [1]  Allergies Allergen Reactions   Ciprofloxacin Anaphylaxis, Hives and Other (See Comments)    Dizziness, also   Tramadol Swelling and Other (See Comments)    Laryngeal edema   Duloxetine Other (See Comments)    Delusions    Gabapentin  Other (See Comments)    Affects eyesight and makes the patient very unsteady   Hydrocodone-Acetaminophen  Nausea And Vomiting   Nisoldipine Other (See Comments)    Pt unsure of reaction (Sular, for hypertension)   Benadryl [Diphenhydramine] Anxiety and Other (See Comments)    Panic attack    Methocarbamol Nausea Only   Ondansetron  Hcl Nausea Only   Sulfa Antibiotics Rash

## 2024-12-07 NOTE — Progress Notes (Signed)
 PHARMACY - ANTICOAGULATION CONSULT NOTE  Pharmacy Consult for heparin  Indication: chest pain/ACS  Allergies[1]  Patient Measurements: Height: 6' 3 (190.5 cm) Weight: 75.9 kg (167 lb 5.3 oz) IBW/kg (Calculated) : 84.5 HEPARIN  DW (KG): 76.6  Vital Signs: Temp: 97.6 F (36.4 C) (12/26 2223) Temp Source: Oral (12/26 2223) BP: 95/58 (12/26 2223) Pulse Rate: 103 (12/26 2223)  Labs: Recent Labs    12/05/24 1521 12/06/24 0410 12/06/24 1350 12/07/24 0008  HGB 8.9* 10.2*  --  9.6*  HCT 27.1* 30.8*  --  28.9*  PLT 248 290  --  247  APTT  --  47* 59* 70*  LABPROT 19.8*  --   --   --   INR 1.6*  --   --   --   HEPARINUNFRC  --  >1.10*  --   --   CREATININE 1.09 1.29*  --   --     Estimated Creatinine Clearance: 44.1 mL/min (A) (by C-G formula based on SCr of 1.29 mg/dL (H)).   Medical History: Past Medical History:  Diagnosis Date   Arthritis    Atrial fibrillation (HCC)    Cancer (HCC)    prostate   Dysrhythmia    afib   History of kidney stones     Medications:  Eliquis  5 mg BID on hold PTA in setting of GIB Pt reports resuming 12/25, took one dose around 10 am  Assessment: 88 year old male presenting with chest pain. Patient recently admitted for chest pain and shortness of breath, noted to have been off his Eliquis  for about a week at that time for GI bleeding. He was discharged 12/21 with instructions to continue holding his Eliquis  at that time. Patient reports resuming Eliquis  12/25 x 1 dose around 10 am. Pharmacy consulted for management of heparin  infusion for ACS.  12/07/2024 aPTT 70 therapeutic on 1250 units/hr Hgb 9.6, plts 247 No bleeding noted   Goal of Therapy:  Heparin  level 0.3 - 0.7 aPTT 66-102 seconds Monitor platelets by anticoagulation protocol: Yes   Plan:  continue heparin  drip at 1,250 units/hr Confirmatory aPTT level in 8 hours Daily CBC  Leeroy Mace RPh 12/07/2024, 1:13 AM         [1]  Allergies Allergen Reactions    Ciprofloxacin Anaphylaxis, Hives and Other (See Comments)    Dizziness, also   Tramadol Swelling and Other (See Comments)    Laryngeal edema   Duloxetine Other (See Comments)    Delusions    Gabapentin  Other (See Comments)    Affects eyesight and makes the patient very unsteady   Hydrocodone-Acetaminophen  Nausea And Vomiting   Nisoldipine Other (See Comments)    Pt unsure of reaction (Sular, for hypertension)   Benadryl [Diphenhydramine] Anxiety and Other (See Comments)    Panic attack    Methocarbamol Nausea Only   Ondansetron  Hcl Nausea Only   Sulfa Antibiotics Rash

## 2024-12-07 NOTE — Progress Notes (Signed)
 "  PROGRESS NOTE  Todd Salazar FMW:[id number] DOB: 09/30/1936 DOA: 12/05/2024 PCP: Patient, No Pcp Per  HPI/Recap of past 24 hours: Todd Salazar is a 88 y.o. male with medical history significant of anemia, CAD, A-fib (has been off Eliquis  secondary to GI bleed), colon cancer, HLD, RLS, prostate cancer, restrictive lung disease, bronchiectasis, CKD stage III presenting with persistent left sided chest pain with dyspnea, ongoing for the past couple of weeks, now with nausea and recurrent vomiting for the past couple of days.  Of note, patient has been in and out of hospitals including [facility] as well as current health.  Last admission in 11/30/2024 for pneumonia and associated chest pain/shortness of breath, was discharged 12/21.  Presented again on 12/25 for persistent chest pain with orthopnea and dyspnea as well as decreased appetite with nausea and recurrent vomiting.  In the ED, VS fairly stable with some noted tachycardia. Labs showed fairly stable except for BNP 500-->4,689.0, troponin 30, flat trend.  Cardiology consulted.  Patient admitted for further management.    Today, patient continues to complain of chest discomfort, denies any further nausea/vomiting, able to tolerate orally.  Noted diarrhea with resultant C. difficile colitis.    Assessment/Plan: Principal Problem:   Chest pain Active Problems:   CAD (coronary artery disease), native coronary artery   Carcinoma of prostate (HCC)   CAP (community acquired pneumonia)   Macrocytic anemia   Anxiety   Current use of long term anticoagulation   Acute respiratory failure with hypoxia (HCC)   Elevated brain natriuretic peptide (BNP) level   HTN (hypertension)   PAF (paroxysmal atrial fibrillation) (HCC)   CKD-3A   Pacemaker   Atrial tachycardia   Chest pain History of CAD ??  Recurrent pneumonia, ?  Aspiration, ?  Pericarditis Noted restrictive lung disease Unknown etiology, unlikely ACS, rule out  pericarditis given history Afebrile, with no leukocytosis Troponin minimally elevated with flat trend EKG with no acute ST changes Echo done 12/01/2024 showed EF of 60 to 65%, no regional wall motion abnormality, right ventricular systolic function was normal BNP elevated  500-->4,689 LDL 38 Chest x-ray negative for volume overload, questionable persistent pneumonia CRP 5.2, ESR 22 Procalcitonin, lactic acid WNL CT chest/abdomen showed stable small focal airspace consolidation in the posterior right lower lobe, questionable focal wall thickening in the mid ascending colon without acute inflammatory stranding.  Diffuse colonic diverticulosis.  Consider colonoscopy for further evaluation SLP pending Cardiology consulted, appreciate recs Will hold off Lasix  for now as patient with emesis, soft BP, and does not appear volume overloaded Continue ceftriaxone , azithromycin  for now DuoNebs as needed Telemetry, monitor closely  C. difficile colitis Intractable nausea/vomiting Reported diarrhea, positive for C. Difficile CT a/p showed questionable focal wall thickening in the mid ascending colon without acute inflammatory stranding.  Diffuse colonic diverticulosis.  Consider colonoscopy for further evaluation once fully recovered.  Also noted bilateral renal cysts, largest in the left kidney measuring 4.1 cm with numerous punctate left renal calculi Start Dificid  IV fluid, antiemetics, monitor closely  AKI on CKD stage IIIa Bilateral renal cysts Baseline creatinine around 1.1, up to 2.39 Likely 2/2 above CT A/P showed bilateral renal cysts, numerous punctate left renal calculi IV fluid Daily BMP  HTN Currently soft Hold Lasix  for now, patient appears dry with recurrent vomiting Continue IV hydration Discontinue metoprolol  Monitor closely  Persistent A-fib SSS s/p PPM History of atrial tachycardia/PVCs Currently in atrial tachycardia History of ablation Currently rate  controlled Cardiology on board, restart  Eliquis , wean off amiodarone  drip and start amiodarone  p.o. 400 mg twice daily provide patient able to tolerate orally Of note, patient has failed sotalol , flecainide , Tikosyn  and propafenone, has not also tolerated AV nodal blocking agents Telemetry  Normocytic anemia/anemia of chronic disease Folate deficiency Hemoglobin around baseline Anemia panel done 11/30/2024 showed iron 43, ferritin greater than 1000, folate 4, vitamin B12 greater than 1000 Continue folic acid  Daily CBC   Carcinoma of prostate In remission, follow-up as an outpatient        Wound 12/06/24 1323 Pressure Injury Perineum Medial Stage 2 -  Partial thickness loss of dermis presenting as a shallow open injury with a red, pink wound bed without slough. (Active)          Estimated body mass index is 20.72 kg/m as calculated from the following:   Height as of this encounter: 6' 3 (1.905 m).   Weight as of this encounter: 75.2 kg.     Code Status: Full  Family Communication: None at bedside  Disposition Plan: Status is: Inpatient Remains inpatient appropriate because: Level of care      Consultants: Cardiology  Procedures: None  Antimicrobials: Ceftriaxone , azithromycin  Dificid   DVT prophylaxis: Eliquis    Objective: Vitals:   12/06/24 2223 12/07/24 0500 12/07/24 0554 12/07/24 1419  BP: (!) 95/58  (!) 93/50 (!) 93/52  Pulse: (!) 103  70 64  Resp: 18  17 16   Temp: 97.6 F (36.4 C)  98.4 F (36.9 C) 98.7 F (37.1 C)  TempSrc: Oral  Oral Oral  SpO2: 93%  98% 99%  Weight:  75.2 kg    Height:        Intake/Output Summary (Last 24 hours) at 12/07/2024 1933 Last data filed at 12/07/2024 1500 Gross per 24 hour  Intake 1475.16 ml  Output --  Net 1475.16 ml   Filed Weights   12/05/24 2143 12/06/24 0422 12/07/24 0500  Weight: 76.6 kg 75.9 kg 75.2 kg    Exam: General: NAD  Cardiovascular: S1, S2 present Respiratory: CTAB Abdomen:  Soft, nontender, nondistended, bowel sounds present Musculoskeletal: No bilateral pedal edema noted Skin: Normal Psychiatry: Normal mood     Data Reviewed: CBC: Recent Labs  Lab 12/01/24 0413 12/05/24 1521 12/06/24 0410 12/07/24 0008  WBC 6.2 5.8 18.6* 16.2*  HGB 8.0* 8.9* 10.2* 9.6*  HCT 25.5* 27.1* 30.8* 28.9*  MCV 98.5 96.1 96.3 96.3  PLT 243 248 290 247   Basic Metabolic Panel: Recent Labs  Lab 11/30/24 2223 12/01/24 0413 12/05/24 1521 12/05/24 1848 12/06/24 0410 12/06/24 1045 12/07/24 0008  NA 141 142 137  --  140  --  138  K 4.7 5.3* 4.5  --  4.9  --  4.8  CL 110 111 107  --  104  --  103  CO2 22 25 20*  --  21*  --  22  GLUCOSE 102* 95 92  --  102*  --  119*  BUN 17 16 13   --  14  --  24*  CREATININE 1.13 1.12 1.09  --  1.29*  --  2.39*  CALCIUM  8.8* 8.9 9.0  --  9.4  --  8.5*  MG  --  2.0  --  1.6*  --  2.4  --   PHOS  --  3.2  --  2.7  --   --   --    GFR: Estimated Creatinine Clearance: 23.6 mL/min (A) (by C-G formula based on SCr of 2.39 mg/dL (H)). Liver Function  Tests: Recent Labs  Lab 12/01/24 0413 12/05/24 1521 12/06/24 0410  AST 16 17 15   ALT 12 12 11   ALKPHOS 88 84 100  BILITOT 1.2 1.6* 1.2  PROT 6.5 6.7 7.4  ALBUMIN 3.9 4.0 4.3   No results for input(s): LIPASE, AMYLASE in the last 168 hours. No results for input(s): AMMONIA in the last 168 hours. Coagulation Profile: Recent Labs  Lab 12/05/24 1521  INR 1.6*   Cardiac Enzymes: No results for input(s): CKTOTAL, CKMB, CKMBINDEX, TROPONINI in the last 168 hours.  BNP (last 3 results) Recent Labs    12/05/24 1522 12/06/24 0410 12/07/24 0008  PROBNP 4,689.0* 4,621.0* 6,363.0*   HbA1C: No results for input(s): HGBA1C in the last 72 hours. CBG: Recent Labs  Lab 12/06/24 0816 12/07/24 0810  GLUCAP 94 104*   Lipid Profile: Recent Labs    12/07/24 0008  CHOL 92  HDL 36*  LDLCALC 38  TRIG 88  CHOLHDL 2.6   Thyroid  Function Tests: No results for  input(s): TSH, T4TOTAL, FREET4, T3FREE, THYROIDAB in the last 72 hours. Anemia Panel: No results for input(s): VITAMINB12, FOLATE, FERRITIN, TIBC, IRON, RETICCTPCT in the last 72 hours. Urine analysis:    Component Value Date/Time   COLORURINE STRAW (A) 12/06/2024 0953   APPEARANCEUR CLEAR 12/06/2024 0953   LABSPEC 1.006 12/06/2024 0953   PHURINE 5.0 12/06/2024 0953   GLUCOSEU NEGATIVE 12/06/2024 0953   HGBUR NEGATIVE 12/06/2024 0953   BILIRUBINUR NEGATIVE 12/06/2024 0953   KETONESUR NEGATIVE 12/06/2024 0953   PROTEINUR NEGATIVE 12/06/2024 0953   UROBILINOGEN 1.0 12/11/2011 0005   NITRITE NEGATIVE 12/06/2024 0953   LEUKOCYTESUR NEGATIVE 12/06/2024 0953   Sepsis Labs: @LABRCNTIP (procalcitonin:4,lacticidven:4)  ) Recent Results (from the past 240 hours)  Resp panel by RT-PCR (RSV, Flu A&B, Covid) Anterior Nasal Swab     Status: None   Collection Time: 11/30/24  5:10 PM   Specimen: Anterior Nasal Swab  Result Value Ref Range Status   SARS Coronavirus 2 by RT PCR NEGATIVE NEGATIVE Final    Comment: (NOTE) SARS-CoV-2 target nucleic acids are NOT DETECTED.  The SARS-CoV-2 RNA is generally detectable in upper respiratory specimens during the acute phase of infection. The lowest concentration of SARS-CoV-2 viral copies this assay can detect is 138 copies/mL. A negative result does not preclude SARS-Cov-2 infection and should not be used as the sole basis for treatment or other patient management decisions. A negative result may occur with  improper specimen collection/handling, submission of specimen other than nasopharyngeal swab, presence of viral mutation(s) within the areas targeted by this assay, and inadequate number of viral copies(<138 copies/mL). A negative result must be combined with clinical observations, patient history, and epidemiological information. The expected result is Negative.  Fact Sheet for Patients:   bloggercourse.com  Fact Sheet for Healthcare Providers:  seriousbroker.it  This test is no t yet approved or cleared by the [place]  FDA and  has been authorized for detection and/or diagnosis of SARS-CoV-2 by FDA under an Emergency Use Authorization (EUA). This EUA will remain  in effect (meaning this test can be used) for the duration of the COVID-19 declaration under Section 564(b)(1) of the Act, 21 U.S.C.section 360bbb-3(b)(1), unless the authorization is terminated  or revoked sooner.       Influenza A by PCR NEGATIVE NEGATIVE Final   Influenza B by PCR NEGATIVE NEGATIVE Final    Comment: (NOTE) The Xpert Xpress SARS-CoV-2/FLU/RSV plus assay is intended as an aid in the diagnosis of influenza from Nasopharyngeal  swab specimens and should not be used as a sole basis for treatment. Nasal washings and aspirates are unacceptable for Xpert Xpress SARS-CoV-2/FLU/RSV testing.  Fact Sheet for Patients: bloggercourse.com  Fact Sheet for Healthcare Providers: seriousbroker.it  This test is not yet approved or cleared by the [place]  FDA and has been authorized for detection and/or diagnosis of SARS-CoV-2 by FDA under an Emergency Use Authorization (EUA). This EUA will remain in effect (meaning this test can be used) for the duration of the COVID-19 declaration under Section 564(b)(1) of the Act, 21 U.S.C. section 360bbb-3(b)(1), unless the authorization is terminated or revoked.     Resp Syncytial Virus by PCR NEGATIVE NEGATIVE Final    Comment: (NOTE) Fact Sheet for Patients: bloggercourse.com  Fact Sheet for Healthcare Providers: seriousbroker.it  This test is not yet approved or cleared by the [place]  FDA and has been authorized for detection and/or diagnosis of SARS-CoV-2 by FDA under an Emergency Use  Authorization (EUA). This EUA will remain in effect (meaning this test can be used) for the duration of the COVID-19 declaration under Section 564(b)(1) of the Act, 21 U.S.C. section 360bbb-3(b)(1), unless the authorization is terminated or revoked.  Performed at [facility], [address], [place], [place] [address]   Blood Culture (routine x 2)     Status: None (Preliminary result)   Collection Time: 12/05/24  3:10 PM   Specimen: Right Antecubital; Blood  Result Value Ref Range Status   Specimen Description   Final    RIGHT ANTECUBITAL Performed at [facility], [address] [place], [place] [address]    Special Requests   Final    BOTTLES DRAWN AEROBIC AND ANAEROBIC Blood Culture adequate volume Performed at [facility], [address] [place], [place] [address]    Culture   Final    NO GROWTH 2 DAYS Performed at [facility], [address], [place], [place] [address]    Report Status PENDING  Incomplete  Blood Culture (routine x 2)     Status: None (Preliminary result)   Collection Time: 12/05/24  3:15 PM   Specimen: BLOOD RIGHT FOREARM  Result Value Ref Range Status   Specimen Description   Final    BLOOD RIGHT FOREARM Performed at [facility], [address] [place], [place] [address]    Special Requests   Final    BOTTLES DRAWN AEROBIC ONLY BLOOD RIGHT FOREARM Performed at [facility], [address], [place], [place] [address]    Culture   Final    NO GROWTH 2 DAYS Performed at [facility], [address] [place], [place] [address]    Report Status PENDING  Incomplete  MRSA Next Gen by PCR, Nasal     Status: Abnormal   Collection Time: 12/05/24 10:08 PM   Specimen: Nasal Mucosa; Nasal Swab  Result Value Ref Range Status   MRSA by PCR Next Gen DETECTED (A) NOT DETECTED Final    Comment: RESULT CALLED TO, READ BACK BY AND VERIFIED WITH: JONETTA LESCHES, RN [phone] 12/06/24 BY ALONSO CORDS (NOTE) The GeneXpert MRSA Assay (FDA approved for NASAL specimens only), is one component of a comprehensive MRSA colonization surveillance program. It is not intended to diagnose MRSA infection nor to guide or monitor treatment for MRSA infections. Test performance is not FDA approved in patients less than 39 years old. Performed at [facility], [address] [place],  [place] [address]   C Difficile Quick Screen w PCR reflex     Status: Abnormal   Collection Time: 12/07/24 10:58 AM   Specimen: STOOL  Result Value Ref Range Status   C Diff antigen POSITIVE (A) NEGATIVE Final   C Diff toxin POSITIVE (A) NEGATIVE Final   C Diff interpretation Toxin producing C. difficile detected.  Final    Comment: CRITICAL RESULT CALLED TO, READ BACK BY AND VERIFIED WITH: KATHEE KURTZ, RN ON 12/07/2024 AT 1131 BY SL Performed at [facility], [address] [place], [place] [address]       Studies: No results found.  Scheduled Meds:  apixaban   2.5 mg Oral BID   azithromycin   500 mg Oral Daily   Chlorhexidine  Gluconate Cloth  6 each Topical Daily   ferrous sulfate   325 mg Oral QODAY   fidaxomicin   200 mg Oral BID   folic acid   1 mg Oral Daily    morphine  injection  1 mg Intravenous Once   mupirocin  ointment  1 Application Nasal BID   sodium chloride  flush  3 mL Intravenous Q12H   sodium chloride  flush  3 mL Intravenous Q12H   sucralfate   1 g Oral TID WC & HS    Continuous Infusions:  sodium chloride      amiodarone  30 mg/hr (12/07/24 1639)   cefTRIAXone  (ROCEPHIN )  IV 2 g (12/07/24 1839)   famotidine  (PEPCID ) IV       LOS: 2 days     Lebron JINNY Cage, MD [facility]  If 7PM-7AM, please contact night-coverage [email or web address] 12/07/2024, 7:33 PM    "

## 2024-12-07 NOTE — Progress Notes (Signed)
"  °  Progress Note  Patient Name: Todd Salazar Date of Encounter: 12/07/2024 [facility] Cardiologist: None   Interval Summary   Resting comfortably.  States that he woke up around 3 AM and felt fine.  Then he woke up again and he started feeling that pinpoint right of center chest discomfort again.  Has been eating ice.  Has not had nausea or vomiting since last night.  Vital Signs Vitals:   12/06/24 1859 12/06/24 2223 12/07/24 0500 12/07/24 0554  BP: (!) 92/56 (!) 95/58  (!) 93/50  Pulse: (!) 107 (!) 103  70  Resp: 16 18  17   Temp: 99.1 F (37.3 C) 97.6 F (36.4 C)  98.4 F (36.9 C)  TempSrc: Oral Oral  Oral  SpO2: 97% 93%  98%  Weight:   75.2 kg   Height:        Intake/Output Summary (Last 24 hours) at 12/07/2024 0825 Last data filed at 12/07/2024 0300 Gross per 24 hour  Intake 1369.51 ml  Output 320 ml  Net 1049.51 ml      12/07/2024    5:00 AM 12/06/2024    4:22 AM 12/05/2024    9:43 PM  Last 3 Weights  Weight (lbs) 165 lb 12.6 oz 167 lb 5.3 oz 168 lb 14 oz  Weight (kg) 75.2 kg 75.9 kg 76.6 kg      Telemetry/ECG  Currently sinus rhythm.  Previously atrial tachycardia- Personally Reviewed  Physical Exam  GEN: No acute distress.  Elderly Neck: No JVD Cardiac: Occasional ectopy, RRR, no murmurs, rubs, or gallops.  Respiratory: Clear to auscultation bilaterally. GI: Soft, nontender, non-distended  MS: No edema  Assessment & Plan   88 year old with right middle lobe and left lower lobe infiltrate/consolidation concerning for pneumonia with persistent chest discomfort constant for several days duration.  Echocardiogram reassuring with no pericardial effusion, normal EF.  Troponin minimally elevated and flat.  Chest discomfort - Likely related to pneumonia or perhaps pericardial inflammation.  CRP 5.2 and ESR 22 are mildly elevated.  There is no obvious ECG changes suggestive of pericarditis.  He has been diagnosed with this in the past. -For now I  would treat pneumonia.  Not ACS.  Pain also seem to be worse with palpation to chest wall.  His prior pericarditis was after ablation.  Permanent pacemaker - Sick sinus syndrome, PVCs 11%.  Atrial tachycardia - Now on IV amiodarone . -Now on IV heparin  given paroxysmal atrial fibrillation history and CHA2DS2-VASc score of 3.  I am fine with him switching back to Eliquis  as long as he is able to tolerate from a nausea and vomiting perspective. - He has failed multiple antiarrhythmics including Tikosyn  flecainide  sotalol  propafenone and had ablation in 2024. -I am also fine with him switching over to p.o. amiodarone  400 mg twice a day if he is able to tolerate from a nausea vomiting perspective.  Persistent atrial fibrillation - Eliquis  previously discontinued by EP team in the past and patient restarted in 11/2023.   AKI on chronic kidney disease stage III AA -Creatinine is rising.  Getting hydration, IV fluids. -Creatinine was 1.09 and is now 2.39  BNP elevation from 500 to 4600-no evidence of heart failure. -AKI noted.  No Lasix  needed.  Cancer of prostate - In remission    For questions or updates, please contact [facility] Please consult [email or web address] for contact info under         Signed, Oneil Parchment, MD   "

## 2024-12-07 NOTE — Evaluation (Signed)
 Physical Therapy Evaluation Patient Details Name: Todd Salazar MRN: [id number] DOB: March 06, 1936 Today's Date: 12/07/2024  History of Present Illness  88 yo male admitted with chest pain, possible Pna. Hx of CAD, CKD, restrictive lung disease, colon Ca, Afib, RLS, pacemaker, prostate Ca  Clinical Impression  On eval, pt required Min A for mobility. He ambulated ~65 feet with a RW. O2>90% on RA during session. Pt was dyspneic with activity. Pt presents with general weakness, decreased activity tolerance, and impaired gait/balance. He appears to have had a decline in mobility since last admission during which he was able to ambulate 150 feet at a supervision level. Discussed d/c plan-pt plans to return home where he lives alone. He reports he has good family support. He is agreeable to HHPT f/u. Will continue to follow pt during this hospital stay.      If plan is discharge home, recommend the following: A little help with walking and/or transfers;A little help with bathing/dressing/bathroom;Assistance with cooking/housework;Assist for transportation   Can travel by private vehicle        Equipment Recommendations None recommended by PT  Recommendations for Other Services       Functional Status Assessment Patient has had a recent decline in their functional status and demonstrates the ability to make significant improvements in function in a reasonable and predictable amount of time.     Precautions / Restrictions Precautions Precautions: Fall Restrictions Weight Bearing Restrictions Per Provider Order: No      Mobility  Bed Mobility Overal bed mobility: Modified Independent             General bed mobility comments: increased time for pt. bedrail utilized.HOB elevated    Transfers Overall transfer level: Needs assistance Equipment used: Rolling walker (2 wheels) Transfers: Sit to/from Stand Sit to Stand: Supervision           General transfer comment: Cues for  safety, hand placement.    Ambulation/Gait Ambulation/Gait assistance: Contact guard assist Gait Distance (Feet): 65 Feet Assistive device: Rolling walker (2 wheels) Gait Pattern/deviations: Step-through pattern, Decreased stride length       General Gait Details: Close guarding for safety 2* pt reported feelling weak. He did not feel he could tolerate ambulating any further. Dyspnea with activity-cues for pursed lip breathing. Pt fatigues easily. He was able to ambulate ~150 feet during last admisson ~6 days ago.  Stairs            Wheelchair Mobility     Tilt Bed    Modified Rankin (Stroke Patients Only)       Balance Overall balance assessment: Needs assistance         Standing balance support: Bilateral upper extremity supported, During functional activity, Reliant on assistive device for balance Standing balance-Leahy Scale: Fair                               Pertinent Vitals/Pain Pain Assessment Pain Assessment: Faces Faces Pain Scale: Hurts a little bit Pain Location: chest Pain Intervention(s): Monitored during session    Home Living Family/patient expects to be discharged to:: Private residence Living Arrangements: Alone Available Help at Discharge: Family Type of Home: House Home Access: Level entry       Home Layout: One level Home Equipment: [employer]ant (2 wheels);Cane - single point;Shower seat;Grab bars - tub/shower;Wheelchair - manual Additional Comments: strong family and social support    Prior Function Prior Level of Function : Driving;Independent/Modified  Independent             Mobility Comments: mod I with use of RW for all ADLs, self care tasks and IADLs       Extremity/Trunk Assessment   Upper Extremity Assessment Upper Extremity Assessment: Defer to OT evaluation    Lower Extremity Assessment Lower Extremity Assessment: Generalized weakness       Communication   Communication Communication: No  apparent difficulties    Cognition Arousal: Alert Behavior During Therapy: WFL for tasks assessed/performed   PT - Cognitive impairments: No apparent impairments                         Following commands: Intact       Cueing Cueing Techniques: Verbal cues     General Comments      Exercises     Assessment/Plan    PT Assessment Patient needs continued PT services  PT Problem List Decreased strength;Decreased activity tolerance;Decreased balance;Decreased mobility;Decreased knowledge of use of DME;Pain       PT Treatment Interventions DME instruction;Gait training;Functional mobility training;Therapeutic activities;Therapeutic exercise;Balance training;Neuromuscular re-education;Patient/family education    PT Goals (Current goals can be found in the Care Plan section)  Acute Rehab PT Goals Patient Stated Goal: feel better. return home. PT Goal Formulation: With patient Time For Goal Achievement: 12/21/24 Potential to Achieve Goals: Good    Frequency Min 3X/week     Co-evaluation               AM-PAC PT 6 Clicks Mobility  Outcome Measure Help needed turning from your back to your side while in a flat bed without using bedrails?: A Little Help needed moving from lying on your back to sitting on the side of a flat bed without using bedrails?: A Little Help needed moving to and from a bed to a chair (including a wheelchair)?: A Little Help needed standing up from a chair using your arms (e.g., wheelchair or bedside chair)?: A Little Help needed to walk in hospital room?: A Little Help needed climbing 3-5 steps with a railing? : A Lot 6 Click Score: 17    End of Session Equipment Utilized During Treatment: Gait belt Activity Tolerance: Patient limited by fatigue Patient left: in chair;with call bell/phone within reach;with chair alarm set   PT Visit Diagnosis: Unsteadiness on feet (R26.81);Other abnormalities of gait and mobility (R26.89);Muscle  weakness (generalized) (M62.81)    Time: 8996-8975 PT Time Calculation (min) (ACUTE ONLY): 21 min   Charges:   PT Evaluation $PT Eval Low Complexity: 1 Low   PT General Charges $$ ACUTE PT VISIT: 1 Visit           Dannial SQUIBB, PT Acute Rehabilitation  Office: [phone]

## 2024-12-08 ENCOUNTER — Inpatient Hospital Stay (HOSPITAL_COMMUNITY)

## 2024-12-08 DIAGNOSIS — R079 Chest pain, unspecified: Secondary | ICD-10-CM | POA: Diagnosis not present

## 2024-12-08 DIAGNOSIS — A0472 Enterocolitis due to Clostridium difficile, not specified as recurrent: Secondary | ICD-10-CM | POA: Diagnosis not present

## 2024-12-08 DIAGNOSIS — R112 Nausea with vomiting, unspecified: Secondary | ICD-10-CM

## 2024-12-08 DIAGNOSIS — R0782 Intercostal pain: Secondary | ICD-10-CM | POA: Diagnosis not present

## 2024-12-08 DIAGNOSIS — I2510 Atherosclerotic heart disease of native coronary artery without angina pectoris: Secondary | ICD-10-CM

## 2024-12-08 DIAGNOSIS — I4891 Unspecified atrial fibrillation: Secondary | ICD-10-CM | POA: Diagnosis not present

## 2024-12-08 DIAGNOSIS — I4719 Other supraventricular tachycardia: Secondary | ICD-10-CM | POA: Diagnosis not present

## 2024-12-08 DIAGNOSIS — J189 Pneumonia, unspecified organism: Secondary | ICD-10-CM | POA: Diagnosis not present

## 2024-12-08 LAB — URINALYSIS, ROUTINE W REFLEX MICROSCOPIC
Bilirubin Urine: NEGATIVE
Glucose, UA: NEGATIVE mg/dL
Hgb urine dipstick: NEGATIVE
Ketones, ur: NEGATIVE mg/dL
Leukocytes,Ua: NEGATIVE
Nitrite: NEGATIVE
Protein, ur: NEGATIVE mg/dL
Specific Gravity, Urine: 1.018 (ref 1.005–1.030)
pH: 5 (ref 5.0–8.0)

## 2024-12-08 LAB — CBC
HCT: 25 % — ABNORMAL LOW (ref 39.0–52.0)
Hemoglobin: 8.2 g/dL — ABNORMAL LOW (ref 13.0–17.0)
MCH: 32 pg (ref 26.0–34.0)
MCHC: 32.8 g/dL (ref 30.0–36.0)
MCV: 97.7 fL (ref 80.0–100.0)
Platelets: 183 K/uL (ref 150–400)
RBC: 2.56 MIL/uL — ABNORMAL LOW (ref 4.22–5.81)
RDW: 26.1 % — ABNORMAL HIGH (ref 11.5–15.5)
WBC: 9.1 K/uL (ref 4.0–10.5)
nRBC: 0 % (ref 0.0–0.2)

## 2024-12-08 LAB — CREATININE, URINE, RANDOM: Creatinine, Urine: 213 mg/dL

## 2024-12-08 LAB — BASIC METABOLIC PANEL WITH GFR
Anion gap: 11 (ref 5–15)
BUN: 33 mg/dL — ABNORMAL HIGH (ref 8–23)
CO2: 20 mmol/L — ABNORMAL LOW (ref 22–32)
Calcium: 7.9 mg/dL — ABNORMAL LOW (ref 8.9–10.3)
Chloride: 104 mmol/L (ref 98–111)
Creatinine, Ser: 3.66 mg/dL — ABNORMAL HIGH (ref 0.61–1.24)
GFR, Estimated: 15 mL/min — ABNORMAL LOW
Glucose, Bld: 99 mg/dL (ref 70–99)
Potassium: 4.5 mmol/L (ref 3.5–5.1)
Sodium: 135 mmol/L (ref 135–145)

## 2024-12-08 LAB — NA AND K (SODIUM & POTASSIUM), RAND UR
Potassium Urine: 58 mmol/L
Sodium, Ur: 51 mmol/L

## 2024-12-08 LAB — GLUCOSE, CAPILLARY: Glucose-Capillary: 108 mg/dL — ABNORMAL HIGH (ref 70–99)

## 2024-12-08 MED ORDER — SODIUM CHLORIDE 0.9 % IV SOLN: INTRAVENOUS | Status: DC

## 2024-12-08 MED ORDER — SODIUM CHLORIDE 0.9 % IV SOLN
12.5000 mg | Freq: Three times a day (TID) | INTRAVENOUS | Status: AC | PRN
  Administered 2024-12-08 – 2024-12-12 (×2): 12.5 mg via INTRAVENOUS
  Filled 2024-12-08: qty 12.5

## 2024-12-08 MED ORDER — FENTANYL CITRATE (PF) 50 MCG/ML IJ SOSY
12.5000 ug | PREFILLED_SYRINGE | Freq: Once | INTRAMUSCULAR | Status: AC
  Administered 2024-12-08: 12.5 ug via INTRAVENOUS
  Filled 2024-12-08: qty 1

## 2024-12-08 NOTE — Progress Notes (Signed)
"  °  Progress Note  Patient Name: Todd Salazar Date of Encounter: 12/08/2024 [facility] Cardiologist: Maude Emmer, MD   Interval Summary   Hypotensive, C. difficile.  AKI.  Creatinine now in 3 range.  Off amiodarone  IV.  Vital Signs Vitals:   12/07/24 1419 12/07/24 2142 12/08/24 0500 12/08/24 0505  BP: (!) 93/52 (!) 95/46  (!) 91/45  Pulse: 64 73  67  Resp: 16 19  19   Temp: 98.7 F (37.1 C) 98.8 F (37.1 C)  98.6 F (37 C)  TempSrc: Oral Oral  Oral  SpO2: 99% 93%  91%  Weight:   75.4 kg   Height:        Intake/Output Summary (Last 24 hours) at 12/08/2024 1122 Last data filed at 12/08/2024 1012 Gross per 24 hour  Intake 1780.98 ml  Output 300 ml  Net 1480.98 ml      12/08/2024    5:00 AM 12/07/2024    5:00 AM 12/06/2024    4:22 AM  Last 3 Weights  Weight (lbs) 166 lb 3.6 oz 165 lb 12.6 oz 167 lb 5.3 oz  Weight (kg) 75.4 kg 75.2 kg 75.9 kg      Telemetry/ECG  Sinus rhythm first-degree AV block- Personally Reviewed  Physical Exam  GEN: No acute distress.  Elderly.  Neck: No JVD Cardiac: RRR, no murmurs, rubs, or gallops.  Respiratory: Clear to auscultation bilaterally. GI: Soft, nontender, non-distended  MS: No edema  Assessment & Plan   88 year old evaluated for persistent chest discomfort constant over several days with minimally elevated flat troponin, normal echocardiogram with no pericardial effusion, CT scan showing right middle lobe and left lower lobe consolidation concerning for possible pneumonia.  Chest discomfort - Likely related to pneumonia or perhaps even pericardial inflammation.  CRP is 5.2 and ESR 22 both mildly elevated.  No obvious changes on EKG suggestive of pericarditis which was diagnosed in the past following atrial fibrillation ablation. -Has some qualities of touch point tenderness, could also be musculoskeletal in etiology. -Continue with treatment of pneumonia.  Atrial tachycardia - Off amiodarone  drip.  Plan was to  give p.o. amiodarone  perhaps 400 twice daily for 5 days then 200 twice daily for 5 days and then 200 mg thereafter. However, he is vomiting and amiodarone  would likely make that worse. Let's hold off for now. Can always restart IV amio if tachycardia returns. -Has failed other antiarrhythmics such as flecainide  sotalol  Tikosyn  propafenone.  Chronic anticoagulation - Eliquis  2.5 mg twice daily  Permanent pacemaker - Functioning properly.  AKI on CKD stage IIIa -Creatinine now 3.6, worsening. -Pressure is low.  91 systolic. -IV fluids for treatment.  C. difficile colitis/diarrhea -Per primary team, No acute findings on abd xray   For questions or updates, please contact [facility] Please consult [email or web address] for contact info under         Signed, Oneil Parchment, MD   "

## 2024-12-08 NOTE — Consult Note (Signed)
 Reason for Consult: Renal failure  Referring Physician:  Dr. Donnamarie  Chief Complaint: Chest pain  Assessment/Plan: AKI on CKDIIIa  with baseline creatinine in the 1.1-1.3 range in the setting of PNA, chest pain or hypotension consistently present during course of hospitalization. Patient is +2.2 liters during this hospitalization. U/S RK10.1 LK 11.5 with diffusely increased echogenicity and simple cysts present. - Likely in ATN from hypotension (not from the contrast given 12/20); he was not hypotensive 3 mths ago or 6 mths ago -> BP usually ran in the 120's/70's range now profoundly hypotensive  in the setting of an infection. His BP was also low 12/20.  - Will give 1 more liter of isotonic fluids. - Will send urine studies; will check PVR bladder scan but not expecting to find obstructive uropathy. - Also broached subject of dialysis; no indication at this current time but certainly possibly in the next 24-72 hrs if no improvement and trend continues.  -Monitor Daily I/Os, Daily weight  -Maintain MAP>65 for optimal renal perfusion.  - Avoid nephrotoxic agents such as IV contrast, NSAIDs, and phosphate containing bowel preps (FLEETS)   CAP recently treated with course of abx. Chest pain thought to be secondary to PNA or inflammation.  PAF -h/o ablation with  Eliquis  recently held because of GIB but actually restarted at home. Back on lower dose Eliquis .  HTN - actually hypotensive the entire hospitalization. 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   HPI: Todd Salazar is an 88 y.o. male with a history of CASHD, h/o pericarditis in 2024, atrial fibrillation, colon cancer, HLD, RLS, prostace cancer, restrictive lung disease, CKDIII recently admitted with chest pain with CTA done at that time showing RLL PNA but no PE + hypoxia (off Eliquis  secondary to GI bleed. Patient was treated with rocephin  azithromycin  and home with oral abx (azithro and cefinir). CTA on  11/30/24. Patient now here with .chest pain found to be tachy and in afib with chest pain described as tightness since Saturday on the left side without radiation and not associated with nausea or vomiting.  BNP 4700 with CXR showing persistent right hilar consolidation and hilar LN treated empirically with abx. Creatinine at discharge was 1.12, and 1.09 at time of re-presentation but has slowly risen each day.  Of note: the son states all this started at Thanksgiving, not feeling well, eventually going to [facility] bec of black tarry stool -> EGD and subsequent neck pain -> evaluation at [facility] in [place]. Nausea also for a few weeks and now here with chest pain again, pleuritic in nature. Remote history of nephrolithiasis h/o ESWL last episode >5 years ago, no NSAID use and no ESRD/ CKD in the family.  ROS Pertinent items are noted in HPI.  Chemistry and CBC: Creatinine, Ser  Date/Time Value Ref Range Status  12/08/2024 04:34 AM 3.66 (H) 0.61 - 1.24 mg/dL Final    Comment:    Delta check noted   12/07/2024 12:08 AM 2.39 (H) 0.61 - 1.24 mg/dL Final    Comment:    Delta check noted   12/06/2024 04:10 AM 1.29 (H) 0.61 - 1.24 mg/dL Final  87/74/7974 96:78 PM 1.09 0.61 - 1.24 mg/dL Final  87/78/7974 95:86 AM 1.12 0.61 - 1.24 mg/dL Final  87/79/7974 89:76 PM 1.13 0.61 - 1.24 mg/dL Final  87/79/7974 94:96 PM 1.18 0.61 - 1.24 mg/dL Final  89/90/7974 90:54 AM 1.19 0.76 - 1.27 mg/dL Final  87/93/7975 91:88 AM 1.36 (H) 0.76 -  1.27 mg/dL Final  87/96/7975 91:67 AM 1.26 0.76 - 1.27 mg/dL Final  91/98/7975 93:58 AM 1.26 (H) 0.61 - 1.24 mg/dL Final  92/68/7975 96:55 AM 1.26 (H) 0.61 - 1.24 mg/dL Final  92/69/7975 95:70 AM 1.18 0.61 - 1.24 mg/dL Final  92/70/7975 93:86 PM 1.20 0.61 - 1.24 mg/dL Final  92/78/7975 91:65 AM 1.43 (H) 0.61 - 1.24 mg/dL Final  92/79/7975 98:69 AM 1.28 (H) 0.61 - 1.24 mg/dL Final  92/88/7975 90:80 AM 1.23 0.76 - 1.27 mg/dL Final  92/98/7975 90:75 AM 1.24 0.76 - 1.27 mg/dL Final   88/82/7976 98:43 PM 1.28 (H) 0.76 - 1.27 mg/dL Final  88/97/7976 89:88 AM 1.26 0.76 - 1.27 mg/dL Final  93/79/7976 97:94 AM 1.26 (H) 0.61 - 1.24 mg/dL Final  93/80/7976 96:65 PM 1.22 0.61 - 1.24 mg/dL Final  94/92/7976 98:71 AM 1.38 (H) 0.61 - 1.24 mg/dL Final  94/93/7976 91:49 AM 1.44 (H) 0.61 - 1.24 mg/dL Final  94/94/7976 89:49 PM 1.42 (H) 0.61 - 1.24 mg/dL Final  94/98/7976 87:67 PM 1.02 0.61 - 1.24 mg/dL Final  95/75/7976 95:86 PM 1.26 (H) 0.61 - 1.24 mg/dL Final  97/98/7976 88:55 AM 1.34 (H) 0.76 - 1.27 mg/dL Final  98/81/7976 87:64 PM 1.61 (H) 0.76 - 1.27 mg/dL Final  88/79/7977 96:65 AM 1.36 (H) 0.61 - 1.24 mg/dL Final  89/74/7977 90:61 AM 1.65 (H) 0.76 - 1.27 mg/dL Final  90/83/7977 88:58 AM 1.35 (H) 0.61 - 1.24 mg/dL Final  90/87/7977 89:97 AM 1.21 0.61 - 1.24 mg/dL Final  90/89/7977 96:63 AM 1.29 (H) 0.61 - 1.24 mg/dL Final  90/90/7977 96:45 AM 1.18 0.61 - 1.24 mg/dL Final  90/91/7977 96:74 AM 1.27 (H) 0.61 - 1.24 mg/dL Final  90/92/7977 98:61 AM 1.19 0.61 - 1.24 mg/dL Final  90/93/7977 88:52 AM 1.37 (H) 0.61 - 1.24 mg/dL Final  87/89/7979 89:51 AM 1.14 0.61 - 1.24 mg/dL Final  96/81/7981 96:99 PM 1.33 (H) 0.61 - 1.24 mg/dL Final  95/94/7982 95:64 AM 1.14 0.61 - 1.24 mg/dL Final  87/71/7986 93:99 PM 1.10 0.50 - 1.35 mg/dL Final  87/69/7987 94:99 AM 1.11 0.50 - 1.35 mg/dL Final  87/70/7987 89:67 PM 1.12 0.50 - 1.35 mg/dL Final  90/78/7988 94:99 AM 1.18 0.4 - 1.5 mg/dL Final  90/82/7988 92:96 AM 1.12 0.4 - 1.5 mg/dL Final  90/83/7988 96:79 AM 1.18 0.4 - 1.5 mg/dL Final  90/84/7988 92:59 PM 1.1 0.4 - 1.5 mg/dL Final   Recent Labs  Lab 12/05/24 1521 12/05/24 1848 12/06/24 0410 12/07/24 0008 12/08/24 0434  NA 137  --  140 138 135  K 4.5  --  4.9 4.8 4.5  CL 107  --  104 103 104  CO2 20*  --  21* 22 20*  GLUCOSE 92  --  102* 119* 99  BUN 13  --  14 24* 33*  CREATININE 1.09  --  1.29* 2.39* 3.66*  CALCIUM  9.0  --  9.4 8.5* 7.9*  PHOS  --  2.7  --   --   --     Recent Labs  Lab 12/05/24 1521 12/06/24 0410 12/07/24 0008 12/08/24 0734  WBC 5.8 18.6* 16.2* 9.1  HGB 8.9* 10.2* 9.6* 8.2*  HCT 27.1* 30.8* 28.9* 25.0*  MCV 96.1 96.3 96.3 97.7  PLT 248 290 247 183   Liver Function Tests: Recent Labs  Lab 12/05/24 1521 12/06/24 0410  AST 17 15  ALT 12 11  ALKPHOS 84 100  BILITOT 1.6* 1.2  PROT 6.7 7.4  ALBUMIN 4.0 4.3  No results for input(s): LIPASE, AMYLASE in the last 168 hours. No results for input(s): AMMONIA in the last 168 hours. Cardiac Enzymes: No results for input(s): CKTOTAL, CKMB, CKMBINDEX, TROPONINI in the last 168 hours. Iron Studies: No results for input(s): IRON, TIBC, TRANSFERRIN, FERRITIN in the last 72 hours. PT/INR: @LABRCNTIP (inr:5)  Xrays/Other Studies: ) Results for orders placed or performed during the hospital encounter of 12/05/24 (from the past 48 hours)  Lactic acid, plasma     Status: None   Collection Time: 12/06/24  6:51 PM  Result Value Ref Range   Lactic Acid, Venous 1.6 0.5 - 1.9 mmol/L    Comment: Performed at [facility], [address], [place], [place] [address]  Lactic acid, plasma     Status: None   Collection Time: 12/06/24  9:20 PM  Result Value Ref Range   Lactic Acid, Venous 1.5 0.5 - 1.9 mmol/L    Comment: Performed at [facility], [address], [place], [place] [address]  APTT     Status: Abnormal   Collection Time: 12/07/24 12:08 AM  Result Value Ref Range   aPTT 70 (H) 24 - 36 seconds    Comment:        IF BASELINE aPTT IS ELEVATED, SUGGEST PATIENT RISK ASSESSMENT BE USED TO DETERMINE APPROPRIATE ANTICOAGULANT THERAPY. Performed at [facility], [address] [place], [place] [address]   Basic metabolic panel     Status: Abnormal   Collection Time: 12/07/24 12:08 AM  Result Value Ref Range   Sodium 138 135 - 145 mmol/L    Comment: Delta check noted    Potassium 4.8 3.5 - 5.1 mmol/L     Comment: Delta check noted    Chloride 103 98 - 111 mmol/L   CO2 22 22 - 32 mmol/L   Glucose, Bld 119 (H) 70 - 99 mg/dL    Comment: Glucose reference range applies only to samples taken after fasting for at least 8 hours.   BUN 24 (H) 8 - 23 mg/dL   Creatinine, Ser 7.60 (H) 0.61 - 1.24 mg/dL    Comment: Delta check noted    Calcium  8.5 (L) 8.9 - 10.3 mg/dL   GFR, Estimated 26 (L) >60 mL/min    Comment: (NOTE) Calculated using the CKD-EPI Creatinine Equation (2021)    Anion gap 13 5 - 15    Comment: Performed at [facility], [address], [place], [place] [address]  CBC     Status: Abnormal   Collection Time: 12/07/24 12:08 AM  Result Value Ref Range   WBC 16.2 (H) 4.0 - 10.5 K/uL   RBC 3.00 (L) 4.22 - 5.81 MIL/uL   Hemoglobin 9.6 (L) 13.0 - 17.0 g/dL   HCT 71.0 (L) 60.9 - 47.9 %   MCV 96.3 80.0 - 100.0 fL   MCH 32.0 26.0 - 34.0 pg   MCHC 33.2 30.0 - 36.0 g/dL   RDW 73.9 (H) 88.4 - 84.4 %   Platelets 247 150 - 400 K/uL   nRBC 0.1 0.0 - 0.2 %    Comment: Performed at [facility], [address], [place], [place] [address]  Pro Brain natriuretic peptide     Status: Abnormal   Collection Time: 12/07/24 12:08 AM  Result Value Ref Range   Pro Brain Natriuretic Peptide 6,363.0 (H) <300.0 pg/mL    Comment: (NOTE) Age Group        Cut-Points    Interpretation  <  50 years     450 pg/mL       NT-proBNP > 450 pg/mL indicates                                ADHF is likely              50 to 75 years  900 pg/mL      NT-proBNP > 900 pg/mL indicates          ADHF is likely  > 75 years      1800 pg/mL     NT-proBNP > 1800 pg/mL indicates          ADHF is likely                           All ages    Results between       Indeterminate. Further clinical             300 and the cut-   information is needed to determine            point for age group   if ADHF is present.                                                             Elecsys proBNP  II/ Elecsys proBNP II STAT           Cut-Point                       Interpretation  300 pg/mL                    NT-proBNP <300pg/mL indicates                             ADHF is not likely  Performed at [facility], [address], [place], [place] [address]   Lipid panel     Status: Abnormal   Collection Time: 12/07/24 12:08 AM  Result Value Ref Range   Cholesterol 92 0 - 200 mg/dL    Comment:        ATP III CLASSIFICATION:  <200     mg/dL   Desirable  799-760  mg/dL   Borderline High  >=759    mg/dL   High           Triglycerides 88 <150 mg/dL   HDL 36 (L) >59 mg/dL   Total CHOL/HDL Ratio 2.6 RATIO   VLDL 18 0 - 40 mg/dL   LDL Cholesterol 38 0 - 99 mg/dL    Comment:        Total Cholesterol/HDL:CHD Risk Coronary Heart Disease Risk Table                     Men   Women  1/2 Average Risk   3.4   3.3  Average Risk       5.0   4.4  2 X Average Risk   9.6   7.1  3 X Average Risk  23.4   11.0        Use the calculated Patient  Ratio above and the CHD Risk Table to determine the patient's CHD Risk.        ATP III CLASSIFICATION (LDL):  <100     mg/dL   Optimal  899-870  mg/dL   Near or Above                    Optimal  130-159  mg/dL   Borderline  839-810  mg/dL   High  >809     mg/dL   Very High Performed at [facility], [address], [place], [place] [address]   Glucose, capillary     Status: Abnormal   Collection Time: 12/07/24  8:10 AM  Result Value Ref Range   Glucose-Capillary 104 (H) 70 - 99 mg/dL    Comment: Glucose reference range applies only to samples taken after fasting for at least 8 hours.  APTT     Status: Abnormal   Collection Time: 12/07/24  8:18 AM  Result Value Ref Range   aPTT 70 (H) 24 - 36 seconds    Comment:        IF BASELINE aPTT IS ELEVATED, SUGGEST PATIENT RISK ASSESSMENT BE USED TO DETERMINE APPROPRIATE ANTICOAGULANT THERAPY. Performed at [facility], [address], [place], [place] [address]   C Difficile Quick Screen w PCR reflex     Status: Abnormal   Collection Time: 12/07/24 10:58 AM   Specimen: STOOL  Result Value Ref Range   C Diff antigen POSITIVE (A) NEGATIVE   C Diff toxin POSITIVE (A) NEGATIVE   C Diff interpretation Toxin producing C. difficile detected.     Comment: CRITICAL RESULT CALLED TO, READ BACK BY AND VERIFIED WITHBETHA KATHEE KURTZ, RN ON 12/07/2024 AT 1131 BY SL Performed at [facility], [address], [place], [place] [address]   Gastrointestinal Panel by PCR , Stool     Status: None   Collection Time: 12/07/24 10:58 AM   Specimen: STOOL  Result Value Ref Range   Campylobacter species NOT DETECTED NOT DETECTED   Plesimonas shigelloides NOT DETECTED NOT DETECTED   Salmonella species NOT DETECTED NOT DETECTED   Yersinia enterocolitica NOT DETECTED NOT DETECTED   Vibrio species NOT DETECTED NOT DETECTED   Vibrio cholerae NOT DETECTED NOT DETECTED   Enteroaggregative E coli (EAEC) NOT DETECTED NOT DETECTED   Enteropathogenic E coli (EPEC) NOT DETECTED NOT DETECTED   Enterotoxigenic E coli (ETEC) NOT DETECTED NOT DETECTED   Shiga like toxin producing E coli (STEC) NOT DETECTED NOT DETECTED   Shigella/Enteroinvasive E coli (EIEC) NOT DETECTED NOT DETECTED   Cryptosporidium NOT DETECTED NOT DETECTED   Cyclospora cayetanensis NOT DETECTED NOT DETECTED   Entamoeba histolytica NOT DETECTED NOT DETECTED   Giardia lamblia NOT DETECTED NOT DETECTED   Adenovirus F40/41 NOT DETECTED NOT DETECTED   Astrovirus NOT DETECTED NOT DETECTED   Norovirus GI/GII NOT DETECTED NOT DETECTED   Rotavirus A NOT DETECTED NOT DETECTED   Sapovirus (I, II, IV, and V) NOT DETECTED NOT DETECTED    Comment: Performed at [facility], [address], [place], [place] [address]  Basic metabolic panel with GFR     Status: Abnormal   Collection Time: 12/08/24  4:34 AM  Result Value Ref Range   Sodium 135 135 - 145 mmol/L    Potassium 4.5 3.5 - 5.1 mmol/L   Chloride 104 98 - 111 mmol/L   CO2 20 (L) 22 - 32 mmol/L   Glucose,  Bld 99 70 - 99 mg/dL    Comment: Glucose reference range applies only to samples taken after fasting for at least 8 hours.   BUN 33 (H) 8 - 23 mg/dL   Creatinine, Ser 6.33 (H) 0.61 - 1.24 mg/dL    Comment: Delta check noted    Calcium  7.9 (L) 8.9 - 10.3 mg/dL   GFR, Estimated 15 (L) >60 mL/min    Comment: (NOTE) Calculated using the CKD-EPI Creatinine Equation (2021)    Anion gap 11 5 - 15    Comment: Performed at [facility], [address] [place], [place] [address]  Glucose, capillary     Status: Abnormal   Collection Time: 12/08/24  7:20 AM  Result Value Ref Range   Glucose-Capillary 108 (H) 70 - 99 mg/dL    Comment: Glucose reference range applies only to samples taken after fasting for at least 8 hours.  CBC     Status: Abnormal   Collection Time: 12/08/24  7:34 AM  Result Value Ref Range   WBC 9.1 4.0 - 10.5 K/uL   RBC 2.56 (L) 4.22 - 5.81 MIL/uL   Hemoglobin 8.2 (L) 13.0 - 17.0 g/dL   HCT 74.9 (L) 60.9 - 47.9 %   MCV 97.7 80.0 - 100.0 fL   MCH 32.0 26.0 - 34.0 pg   MCHC 32.8 30.0 - 36.0 g/dL   RDW 73.8 (H) 88.4 - 84.4 %   Platelets 183 150 - 400 K/uL   nRBC 0.0 0.0 - 0.2 %    Comment: Performed at [facility], [address] [place], [place] [address]   US  RENAL Result Date: 12/08/2024 CLINICAL DATA:  Acute kidney injury, renal cysts. EXAM: RENAL / URINARY TRACT ULTRASOUND COMPLETE COMPARISON:  None Available. FINDINGS: Right Kidney: Renal measurements: 10.1 cm x 4.3 cm x 5.4 cm = volume: 121.8 mL. There is diffusely increased echogenicity of the renal parenchyma. A 1.1 cm x 1.0 cm x 1.3 cm simple cyst is seen within the superolateral aspect of the right kidney. No hydronephrosis is visualized. Left Kidney: Renal measurements: 11.5 cm x 4.9 cm x 4.9 cm = volume: 142.4 mL. There is diffusely increased echogenicity of the renal  parenchyma. 2.2 cm x 2.2 cm x 2.7 cm and 4.1 cm x 4.3 cm x 4.3 cm simple cysts are seen within the left kidney no hydronephrosis is visualized. Bladder: Appears normal for degree of bladder distention. An adjacent penile pump reservoir is noted. Other: None. IMPRESSION: 1. Bilateral echogenic kidneys which likely represents sequelae associated with medical renal disease. 2. Bilateral simple renal cysts. Electronically Signed   By: Suzen Dials M.D.   On: 12/08/2024 11:28   DG Abd Portable 1V Result Date: 12/08/2024 CLINICAL DATA:  Abdominal pain.  Nausea and vomiting. EXAM: PORTABLE ABDOMEN - 1 VIEW COMPARISON:  None Available. FINDINGS: The bowel gas pattern is normal. No radio-opaque calculi or other significant radiographic abnormality are seen. Right upper quadrant surgical clips, lumbar spine fusion hardware and bilateral hip prostheses noted. IMPRESSION: Unremarkable bowel gas pattern.  No acute findings. Electronically Signed   By: Norleen DELENA Kil M.D.   On: 12/08/2024 09:40    PMH:   Past Medical History:  Diagnosis Date   Arthritis    Atrial fibrillation (HCC)    Cancer (HCC)    prostate   Dysrhythmia    afib   History of kidney stones     PSH:   Past Surgical History:  Procedure Laterality Date  ATRIAL FIBRILLATION ABLATION N/A 10/29/2021   Procedure: ATRIAL FIBRILLATION ABLATION;  Surgeon: Inocencio Soyla Lunger, MD;  Location: [facility] INVASIVE CV LAB;  Service: Cardiovascular;  Laterality: N/A;   ATRIAL FIBRILLATION ABLATION N/A 06/29/2023   Procedure: ATRIAL FIBRILLATION ABLATION;  Surgeon: Inocencio Soyla Lunger, MD;  Location: [facility] INVASIVE CV LAB;  Service: Cardiovascular;  Laterality: N/A;   Bone spur     CARDIOVERSION N/A 06/01/2022   Procedure: CARDIOVERSION;  Surgeon: Kate Lonni CROME, MD;  Location: [facility] ENDOSCOPY;  Service: Cardiovascular;  Laterality: N/A;   CHOLECYSTECTOMY     HERNIA REPAIR     JOINT REPLACEMENT     kidney stones     LAMINECTOMY WITH POSTERIOR  LATERAL ARTHRODESIS LEVEL 1 Right 11/25/2019   Procedure: Laminectomy and Foraminotomy  Lumbar Four-Lumbar Five - right, with instrumented fusion;  Surgeon: Joshua Alm RAMAN, MD;  Location: [facility] OR;  Service: Neurosurgery;  Laterality: Right;  Laminectomy and Foraminotomy  Lumbar Four-Lumbar Five - right, with instrumented fusion   PROSTATE SURGERY     TEE WITHOUT CARDIOVERSION N/A 06/01/2022   Procedure: TRANSESOPHAGEAL ECHOCARDIOGRAM (TEE);  Surgeon: Kate Lonni CROME, MD;  Location: [facility] ENDOSCOPY;  Service: Cardiovascular;  Laterality: N/A;    Allergies: Allergies[1]  Medications:   Prior to Admission medications  Medication Sig Start Date End Date Taking? Authorizing Provider  cefdinir  (OMNICEF ) 300 MG capsule Take 1 capsule (300 mg total) by mouth 2 (two) times daily. 12/01/24  Yes Rojelio Nest, DO  [Paused] ELIQUIS  5 MG TABS tablet TAKE 1 TABLET BY MOUTH 2 TIMES A DAY Wait to take this until your doctor or other care provider tells you to start again. 10/14/24  Yes Camnitz, Will Lunger, MD  ferrous sulfate  325 (65 FE) MG tablet Take 325 mg by mouth every other day.   Yes [provider]  folic acid  (FOLVITE ) 1 MG tablet Take 1 tablet (1 mg total) by mouth daily. 12/01/24 12/31/24 Yes Rojelio Nest, DO  nitroGLYCERIN  (NITROSTAT ) 0.4 MG SL tablet Place 1 tablet (0.4 mg total) under the tongue every 5 (five) minutes as needed for chest pain. 09/18/24  Yes Leverne Charlies Helling, PA-C  metoprolol  tartrate (LOPRESSOR ) 25 MG tablet Take 1 tablet (25 mg total) by mouth daily as needed (PALPITATIONS LSTING LONGER THATN 30 MINUTES). 09/18/24   Ursuy, Renee Lynn, PA-C    Discontinued Meds:   Medications Discontinued During This Encounter  Medication Reason   lactated ringers  bolus 1,000 mL    lactated ringers  infusion    sodium phosphate  (FLEET) enema 1 enema    cefUROXime  (CEFTIN ) tablet 500 mg    HYDROmorphone  (DILAUDID ) injection 0.5-1 mg    heparin  injection 5,000 Units    furosemide   (LASIX ) injection 40 mg    promethazine  (PHENERGAN ) 6.25 mg/NS 50 mL IVPB Dose change   furosemide  (LASIX ) injection 40 mg    metoprolol  tartrate (LOPRESSOR ) tablet 25 mg    morphine  (PF) 2 MG/ML injection 2 mg    heparin  ADULT infusion 100 units/mL (25000 units/250mL)    pantoprazole  (PROTONIX ) injection 40 mg    amiodarone  (NEXTERONE  PREMIX) 360-4.14 MG/200ML-% (1.8 mg/mL) IV infusion    amiodarone  (PACERONE ) tablet 400 mg    0.9 %  sodium chloride  infusion     Social History:  reports that he quit smoking about 28 years ago. His smoking use included cigarettes. He has never used smokeless tobacco. He reports that he does not drink alcohol and does not use drugs.  Family History:   Family History  Problem Relation Age of Onset   Hypertension Father    CAD Neg Hx    Heart failure Neg Hx    Stroke Neg Hx     Blood pressure (!) 91/45, pulse 67, temperature 98.6 F (37 C), temperature source Oral, resp. rate 19, height 6' 3 (1.905 m), weight 75.4 kg, SpO2 91%. General appearance: alert and cooperative Head: Normocephalic, without obvious abnormality, atraumatic Eyes: negative Neck: no adenopathy, no carotid bruit, no JVD, supple, symmetrical, trachea midline, and thyroid  not enlarged, symmetric, no tenderness/mass/nodules Back: symmetric, no curvature. ROM normal. No CVA tenderness. Resp: clear to auscultation bilaterally Cardio: regular rate and rhythm GI: soft, non-tender; bowel sounds normal; no masses,  no organomegaly Extremities: edema none Pulses: 2+ and symmetric GU: no foley        Todd Salazar, Todd ORN, MD 12/08/2024, 1:50 PM      [1]  Allergies Allergen Reactions   Ciprofloxacin Anaphylaxis, Hives and Other (See Comments)    Dizziness, also   Tramadol Swelling and Other (See Comments)    Laryngeal edema   Duloxetine Other (See Comments)    Delusions    Gabapentin  Other (See Comments)    Affects eyesight and makes the patient very unsteady    Hydrocodone-Acetaminophen  Nausea And Vomiting   Nisoldipine Other (See Comments)    Pt unsure of reaction (Sular, for hypertension)   Benadryl [Diphenhydramine] Anxiety and Other (See Comments)    Panic attack    Methocarbamol Nausea Only   Ondansetron  Hcl Nausea Only   Sulfa Antibiotics Rash

## 2024-12-08 NOTE — Progress Notes (Signed)
 SLP Cancellation Note  Patient Details Name: Todd Salazar MRN: [id number] DOB: June 21, 1936   Cancelled treatment:       Reason Eval/Treat Not Completed: Medical issues which prohibited therapy. Per RN, pt NPO due to concern for Small Bowel Obstruction. Will continue efforts to complete bedside swallow eval.  Arien Benincasa B. Dory, MSP, CCC-SLP Speech Language Pathologist Office: [phone]  Dory Caprice Daring 12/08/2024, 11:54 AM

## 2024-12-08 NOTE — Progress Notes (Signed)
 "  PROGRESS NOTE  Todd Salazar FMW:[id number] DOB: 1936/08/04 DOA: 12/05/2024 PCP: Patient, No Pcp Per  HPI/Recap of past 24 hours: Todd Salazar is a 88 y.o. male with medical history significant of anemia, CAD, A-fib (has been off Eliquis  secondary to GI bleed), colon cancer, HLD, RLS, prostate cancer, restrictive lung disease, bronchiectasis, CKD stage III presenting with persistent left sided chest pain with dyspnea, ongoing for the past couple of weeks, now with nausea and recurrent vomiting for the past couple of days.  Of note, patient has been in and out of hospitals including [facility] as well as current health.  Last admission in 11/30/2024 for pneumonia and associated chest pain/shortness of breath, was discharged 12/21.  Presented again on 12/25 for persistent chest pain with orthopnea and dyspnea as well as decreased appetite with nausea and recurrent vomiting.  In the ED, VS fairly stable with some noted tachycardia. Labs showed fairly stable except for BNP 500-->4,689.0, troponin 30, flat trend.  Cardiology consulted.  Patient admitted for further management.    Today, patient continues to complain of intractable nausea/vomiting, persistent chest discomfort.  Noted persistent hypotension    Assessment/Plan: Principal Problem:   Chest pain Active Problems:   CAD (coronary artery disease), native coronary artery   Carcinoma of prostate (HCC)   CAP (community acquired pneumonia)   Macrocytic anemia   Anxiety   Current use of long term anticoagulation   Acute respiratory failure with hypoxia (HCC)   Elevated brain natriuretic peptide (BNP) level   HTN (hypertension)   PAF (paroxysmal atrial fibrillation) (HCC)   CKD-3A   Pacemaker   Atrial tachycardia   Chest pain History of CAD ??  Recurrent pneumonia, ?  Aspiration, ?  Pericarditis Noted restrictive lung disease Unknown etiology, unlikely ACS, rule out pericarditis given history Afebrile, with resolved  leukocytosis Troponin minimally elevated with flat trend EKG with no acute ST changes Echo done 12/01/2024 showed EF of 60 to 65%, no regional wall motion abnormality, right ventricular systolic function was normal BNP elevated  500-->4,689 LDL 38 Chest x-ray negative for volume overload, questionable persistent pneumonia CRP 5.2, ESR 22 Procalcitonin, lactic acid WNL CT chest/abdomen showed stable small focal airspace consolidation in the posterior right lower lobe, questionable focal wall thickening in the mid ascending colon without acute inflammatory stranding.  Diffuse colonic diverticulosis.  Consider colonoscopy for further evaluation SLP pending Cardiology consulted, appreciate recs Continue ceftriaxone , azithromycin  x 5 days DuoNebs as needed Telemetry, monitor closely  C. difficile colitis Intractable nausea/vomiting Reported diarrhea, positive for C. Difficile CT a/p showed questionable focal wall thickening in the mid ascending colon without acute inflammatory stranding.  Diffuse colonic diverticulosis.  Consider colonoscopy for further evaluation once fully recovered.  Also noted bilateral renal cysts, largest in the left kidney measuring 4.1 cm with numerous punctate left renal calculi Continue Dificid  IV fluid, antiemetics, monitor closely  AKI on CKD stage IIIa ATN possibly from C. difficile colitis/hypotension Baseline creatinine around 1.1--> 3.6 Likely 2/2 above CT A/P showed bilateral renal cysts, numerous punctate left renal calculi Renal ultrasound with bilateral echogenic kidneys, bilateral simple renal cysts Nephrology consulted, appreciate recs Continue IV fluid Daily BMP  HTN Currently soft/hypotension Continue IV hydration Discontinue metoprolol , hold amiodarone  Monitor closely  Persistent A-fib SSS s/p PPM History of atrial tachycardia/PVCs Currently in atrial tachycardia History of ablation Currently rate controlled Cardiology on board, okay  to holding amiodarone , continue Eliquis  Of note, patient has failed sotalol , flecainide , Tikosyn  and propafenone, has not also  tolerated AV nodal blocking agents Telemetry  Normocytic anemia/anemia of chronic disease Folate deficiency Hemoglobin around baseline Anemia panel done 11/30/2024 showed iron 43, ferritin greater than 1000, folate 4, vitamin B12 greater than 1000 Continue folic acid  Daily CBC   Carcinoma of prostate In remission, follow-up as an outpatient        Wound 12/06/24 1323 Pressure Injury Perineum Medial Stage 2 -  Partial thickness loss of dermis presenting as a shallow open injury with a red, pink wound bed without slough. (Active)          Estimated body mass index is 20.78 kg/m as calculated from the following:   Height as of this encounter: 6' 3 (1.905 m).   Weight as of this encounter: 75.4 kg.     Code Status: Full  Family Communication: None at bedside  Disposition Plan: Status is: Inpatient Remains inpatient appropriate because: Level of care      Consultants: Cardiology Nephrology  Procedures: None  Antimicrobials: Ceftriaxone , azithromycin  Dificid   DVT prophylaxis: Eliquis    Objective: Vitals:   12/07/24 2142 12/08/24 0500 12/08/24 0505 12/08/24 1403  BP: (!) 95/46  (!) 91/45 (!) 107/54  Pulse: 73  67 68  Resp: 19  19 19   Temp: 98.8 F (37.1 C)  98.6 F (37 C) 97.6 F (36.4 C)  TempSrc: Oral  Oral Oral  SpO2: 93%  91% 99%  Weight:  75.4 kg    Height:        Intake/Output Summary (Last 24 hours) at 12/08/2024 1621 Last data filed at 12/08/2024 1604 Gross per 24 hour  Intake 2037.94 ml  Output 550 ml  Net 1487.94 ml   Filed Weights   12/06/24 0422 12/07/24 0500 12/08/24 0500  Weight: 75.9 kg 75.2 kg 75.4 kg    Exam: General: NAD  Cardiovascular: S1, S2 present Respiratory: CTAB Abdomen: Soft, nontender, nondistended, bowel sounds present Musculoskeletal: No bilateral pedal edema noted Skin:  Normal Psychiatry: Normal mood     Data Reviewed: CBC: Recent Labs  Lab 12/05/24 1521 12/06/24 0410 12/07/24 0008 12/08/24 0734  WBC 5.8 18.6* 16.2* 9.1  HGB 8.9* 10.2* 9.6* 8.2*  HCT 27.1* 30.8* 28.9* 25.0*  MCV 96.1 96.3 96.3 97.7  PLT 248 290 247 183   Basic Metabolic Panel: Recent Labs  Lab 12/05/24 1521 12/05/24 1848 12/06/24 0410 12/06/24 1045 12/07/24 0008 12/08/24 0434  NA 137  --  140  --  138 135  K 4.5  --  4.9  --  4.8 4.5  CL 107  --  104  --  103 104  CO2 20*  --  21*  --  22 20*  GLUCOSE 92  --  102*  --  119* 99  BUN 13  --  14  --  24* 33*  CREATININE 1.09  --  1.29*  --  2.39* 3.66*  CALCIUM  9.0  --  9.4  --  8.5* 7.9*  MG  --  1.6*  --  2.4  --   --   PHOS  --  2.7  --   --   --   --    GFR: Estimated Creatinine Clearance: 15.5 mL/min (A) (by C-G formula based on SCr of 3.66 mg/dL (H)). Liver Function Tests: Recent Labs  Lab 12/05/24 1521 12/06/24 0410  AST 17 15  ALT 12 11  ALKPHOS 84 100  BILITOT 1.6* 1.2  PROT 6.7 7.4  ALBUMIN 4.0 4.3   No results for input(s): LIPASE, AMYLASE in the  last 168 hours. No results for input(s): AMMONIA in the last 168 hours. Coagulation Profile: Recent Labs  Lab 12/05/24 1521  INR 1.6*   Cardiac Enzymes: No results for input(s): CKTOTAL, CKMB, CKMBINDEX, TROPONINI in the last 168 hours.  BNP (last 3 results) Recent Labs    12/05/24 1522 12/06/24 0410 12/07/24 0008  PROBNP 4,689.0* 4,621.0* 6,363.0*   HbA1C: No results for input(s): HGBA1C in the last 72 hours. CBG: Recent Labs  Lab 12/06/24 0816 12/07/24 0810 12/08/24 0720  GLUCAP 94 104* 108*   Lipid Profile: Recent Labs    12/07/24 0008  CHOL 92  HDL 36*  LDLCALC 38  TRIG 88  CHOLHDL 2.6   Thyroid  Function Tests: No results for input(s): TSH, T4TOTAL, FREET4, T3FREE, THYROIDAB in the last 72 hours. Anemia Panel: No results for input(s): VITAMINB12, FOLATE, FERRITIN, TIBC, IRON,  RETICCTPCT in the last 72 hours. Urine analysis:    Component Value Date/Time   COLORURINE STRAW (A) 12/06/2024 0953   APPEARANCEUR CLEAR 12/06/2024 0953   LABSPEC 1.006 12/06/2024 0953   PHURINE 5.0 12/06/2024 0953   GLUCOSEU NEGATIVE 12/06/2024 0953   HGBUR NEGATIVE 12/06/2024 0953   BILIRUBINUR NEGATIVE 12/06/2024 0953   KETONESUR NEGATIVE 12/06/2024 0953   PROTEINUR NEGATIVE 12/06/2024 0953   UROBILINOGEN 1.0 12/11/2011 0005   NITRITE NEGATIVE 12/06/2024 0953   LEUKOCYTESUR NEGATIVE 12/06/2024 0953   Sepsis Labs: @LABRCNTIP (procalcitonin:4,lacticidven:4)  ) Recent Results (from the past 240 hours)  Resp panel by RT-PCR (RSV, Flu A&B, Covid) Anterior Nasal Swab     Status: None   Collection Time: 11/30/24  5:10 PM   Specimen: Anterior Nasal Swab  Result Value Ref Range Status   SARS Coronavirus 2 by RT PCR NEGATIVE NEGATIVE Final    Comment: (NOTE) SARS-CoV-2 target nucleic acids are NOT DETECTED.  The SARS-CoV-2 RNA is generally detectable in upper respiratory specimens during the acute phase of infection. The lowest concentration of SARS-CoV-2 viral copies this assay can detect is 138 copies/mL. A negative result does not preclude SARS-Cov-2 infection and should not be used as the sole basis for treatment or other patient management decisions. A negative result may occur with  improper specimen collection/handling, submission of specimen other than nasopharyngeal swab, presence of viral mutation(s) within the areas targeted by this assay, and inadequate number of viral copies(<138 copies/mL). A negative result must be combined with clinical observations, patient history, and epidemiological information. The expected result is Negative.  Fact Sheet for Patients:  bloggercourse.com  Fact Sheet for Healthcare Providers:  seriousbroker.it  This test is no t yet approved or cleared by the [place]  FDA and  has  been authorized for detection and/or diagnosis of SARS-CoV-2 by FDA under an Emergency Use Authorization (EUA). This EUA will remain  in effect (meaning this test can be used) for the duration of the COVID-19 declaration under Section 564(b)(1) of the Act, 21 U.S.C.section 360bbb-3(b)(1), unless the authorization is terminated  or revoked sooner.       Influenza A by PCR NEGATIVE NEGATIVE Final   Influenza B by PCR NEGATIVE NEGATIVE Final    Comment: (NOTE) The Xpert Xpress SARS-CoV-2/FLU/RSV plus assay is intended as an aid in the diagnosis of influenza from Nasopharyngeal swab specimens and should not be used as a sole basis for treatment. Nasal washings and aspirates are unacceptable for Xpert Xpress SARS-CoV-2/FLU/RSV testing.  Fact Sheet for Patients: bloggercourse.com  Fact Sheet for Healthcare Providers: seriousbroker.it  This test is not yet approved or cleared by the  [place]  FDA and has been authorized for detection and/or diagnosis of SARS-CoV-2 by FDA under an Emergency Use Authorization (EUA). This EUA will remain in effect (meaning this test can be used) for the duration of the COVID-19 declaration under Section 564(b)(1) of the Act, 21 U.S.C. section 360bbb-3(b)(1), unless the authorization is terminated or revoked.     Resp Syncytial Virus by PCR NEGATIVE NEGATIVE Final    Comment: (NOTE) Fact Sheet for Patients: bloggercourse.com  Fact Sheet for Healthcare Providers: seriousbroker.it  This test is not yet approved or cleared by the [place]  FDA and has been authorized for detection and/or diagnosis of SARS-CoV-2 by FDA under an Emergency Use Authorization (EUA). This EUA will remain in effect (meaning this test can be used) for the duration of the COVID-19 declaration under Section 564(b)(1) of the Act, 21 U.S.C. section 360bbb-3(b)(1), unless the  authorization is terminated or revoked.  Performed at [facility], [address] [place], [place] [address]   Blood Culture (routine x 2)     Status: None (Preliminary result)   Collection Time: 12/05/24  3:10 PM   Specimen: Right Antecubital; Blood  Result Value Ref Range Status   Specimen Description   Final    RIGHT ANTECUBITAL Performed at [facility], [address] [place], [place] [address]    Special Requests   Final    BOTTLES DRAWN AEROBIC AND ANAEROBIC Blood Culture adequate volume Performed at [facility], [address], [place], [place] [address]    Culture   Final    NO GROWTH 3 DAYS Performed at [facility], [address], [place], [place] [address]    Report Status PENDING  Incomplete  Blood Culture (routine x 2)     Status: None (Preliminary result)   Collection Time: 12/05/24  3:15 PM   Specimen: BLOOD RIGHT FOREARM  Result Value Ref Range Status   Specimen Description   Final    BLOOD RIGHT FOREARM Performed at [facility], [address], [place], [place] [address]    Special Requests   Final    BOTTLES DRAWN AEROBIC ONLY BLOOD RIGHT FOREARM Performed at [facility], [address] [place], [place] [address]    Culture   Final    NO GROWTH 3 DAYS Performed at [facility], [address], [place], [place] [address]    Report Status PENDING  Incomplete  MRSA Next Gen by PCR, Nasal     Status: Abnormal   Collection Time: 12/05/24 10:08 PM   Specimen: Nasal Mucosa; Nasal Swab  Result Value Ref Range Status   MRSA by PCR Next Gen DETECTED (A) NOT DETECTED Final    Comment: RESULT CALLED TO, READ BACK BY AND VERIFIED WITH: JONETTA LESCHES, RN [phone] 12/06/24 BY ALONSO CORDS (NOTE) The GeneXpert MRSA Assay (FDA approved for NASAL specimens only), is one component of a comprehensive MRSA colonization surveillance program. It is not intended to  diagnose MRSA infection nor to guide or monitor treatment for MRSA infections. Test performance is not FDA approved in patients less than 28 years old. Performed at [facility], [address] [place], [place] [address]   C Difficile Quick Screen w PCR reflex     Status: Abnormal   Collection Time: 12/07/24 10:58 AM   Specimen: STOOL  Result Value Ref Range Status   C Diff antigen POSITIVE (A) NEGATIVE Final   C Diff toxin  POSITIVE (A) NEGATIVE Final   C Diff interpretation Toxin producing C. difficile detected.  Final    Comment: CRITICAL RESULT CALLED TO, READ BACK BY AND VERIFIED WITH: KATHEE KURTZ, RN ON 12/07/2024 AT 1131 BY SL Performed at [facility], [address] [place], [place] [address]   Gastrointestinal Panel by PCR , Stool     Status: None   Collection Time: 12/07/24 10:58 AM   Specimen: STOOL  Result Value Ref Range Status   Campylobacter species NOT DETECTED NOT DETECTED Final   Plesimonas shigelloides NOT DETECTED NOT DETECTED Final   Salmonella species NOT DETECTED NOT DETECTED Final   Yersinia enterocolitica NOT DETECTED NOT DETECTED Final   Vibrio species NOT DETECTED NOT DETECTED Final   Vibrio cholerae NOT DETECTED NOT DETECTED Final   Enteroaggregative E coli (EAEC) NOT DETECTED NOT DETECTED Final   Enteropathogenic E coli (EPEC) NOT DETECTED NOT DETECTED Final   Enterotoxigenic E coli (ETEC) NOT DETECTED NOT DETECTED Final   Shiga like toxin producing E coli (STEC) NOT DETECTED NOT DETECTED Final   Shigella/Enteroinvasive E coli (EIEC) NOT DETECTED NOT DETECTED Final   Cryptosporidium NOT DETECTED NOT DETECTED Final   Cyclospora cayetanensis NOT DETECTED NOT DETECTED Final   Entamoeba histolytica NOT DETECTED NOT DETECTED Final   Giardia lamblia NOT DETECTED NOT DETECTED Final   Adenovirus F40/41 NOT DETECTED NOT DETECTED Final   Astrovirus NOT DETECTED NOT DETECTED Final   Norovirus GI/GII NOT DETECTED NOT  DETECTED Final   Rotavirus A NOT DETECTED NOT DETECTED Final   Sapovirus (I, II, IV, and V) NOT DETECTED NOT DETECTED Final    Comment: Performed at [facility], [address] [place], [place] [address]      Studies: US  RENAL Result Date: 12/08/2024 CLINICAL DATA:  Acute kidney injury, renal cysts. EXAM: RENAL / URINARY TRACT ULTRASOUND COMPLETE COMPARISON:  None Available. FINDINGS: Right Kidney: Renal measurements: 10.1 cm x 4.3 cm x 5.4 cm = volume: 121.8 mL. There is diffusely increased echogenicity of the renal parenchyma. A 1.1 cm x 1.0 cm x 1.3 cm simple cyst is seen within the superolateral aspect of the right kidney. No hydronephrosis is visualized. Left Kidney: Renal measurements: 11.5 cm x 4.9 cm x 4.9 cm = volume: 142.4 mL. There is diffusely increased echogenicity of the renal parenchyma. 2.2 cm x 2.2 cm x 2.7 cm and 4.1 cm x 4.3 cm x 4.3 cm simple cysts are seen within the left kidney no hydronephrosis is visualized. Bladder: Appears normal for degree of bladder distention. An adjacent penile pump reservoir is noted. Other: None. IMPRESSION: 1. Bilateral echogenic kidneys which likely represents sequelae associated with medical renal disease. 2. Bilateral simple renal cysts. Electronically Signed   By: Suzen Dials M.D.   On: 12/08/2024 11:28   DG Abd Portable 1V Result Date: 12/08/2024 CLINICAL DATA:  Abdominal pain.  Nausea and vomiting. EXAM: PORTABLE ABDOMEN - 1 VIEW COMPARISON:  None Available. FINDINGS: The bowel gas pattern is normal. No radio-opaque calculi or other significant radiographic abnormality are seen. Right upper quadrant surgical clips, lumbar spine fusion hardware and bilateral hip prostheses noted. IMPRESSION: Unremarkable bowel gas pattern.  No acute findings. Electronically Signed   By: Norleen DELENA Kil M.D.   On: 12/08/2024 09:40    Scheduled Meds:  apixaban   2.5 mg Oral BID   azithromycin   500 mg Oral Daily   Chlorhexidine  Gluconate Cloth  6  each Topical Daily   ferrous sulfate   325 mg  Oral QODAY   fidaxomicin   200 mg Oral BID   folic acid   1 mg Oral Daily   mupirocin  ointment  1 Application Nasal BID   sodium chloride  flush  3 mL Intravenous Q12H   sodium chloride  flush  3 mL Intravenous Q12H   sucralfate   1 g Oral TID WC & HS    Continuous Infusions:  sodium chloride  100 mL/hr at 12/08/24 1604   cefTRIAXone  (ROCEPHIN )  IV Stopped (12/08/24 1512)   famotidine  (PEPCID ) IV Stopped (12/08/24 1237)   promethazine  (PHENERGAN ) injection (IM or IVPB) Stopped (12/08/24 1032)     LOS: 3 days     Lebron JINNY Cage, MD [facility]  If 7PM-7AM, please contact night-coverage [email or web address] 12/08/2024, 4:21 PM    "

## 2024-12-08 NOTE — Progress Notes (Signed)
 SLP Cancellation Note  Patient Details Name: Huy Majid MRN: [id number] DOB: 09/11/1936   Cancelled treatment:       Reason Eval/Treat Not Completed: Medical issues which prohibited therapy. Per RN, pt currently vomiting. Will continue efforts.   Zaleigh Bermingham B. Dory, MSP, CCC-SLP Speech Language Pathologist Office: [phone]  Dory Caprice Daring 12/08/2024, 8:14 AM

## 2024-12-08 NOTE — Consult Note (Signed)
 "        [facility]    Date of Admission:  12/05/2024     Reason for Consult: cdiff colitis    Referring Provider: Donnamarie     Lines:  peripheral  Abx: 12/27-c fidaxomycin 12/25-c azith/ceftriaxone    12/21-25 azith/cefdinir  outpatient       Assessment: 88 yo male cad, afib off eliquis  due to hx gib, colon cancer, prostate cancer, restrictive lung disease, bronchiectasis, ckd3, admitted 12/25 with left sided chest pain/dyspnea for [redacted] weeks along with new n/v/x2 days found to have cdiff  Recent admission 12/20 treated as pna  Cta negative for pe at that time: 1. No pulmonary embolus. 2. Small to moderate size subpleural consolidation in the posterior right lower lobe, suspicious for pneumonia. 3. Mild patchy ground-glass opacity in the dependent left lower lobe, likely atelectasis. 4. Dilated main pulmonary artery, suggesting pulmonary arterial hypertension. Chronic cardiomegaly.   Afebrile this admission Wbc 18 on presentation resolved on hd2  12/25 bcx negative 12/25 mrsa nares pcr negative 12/27 cdiff screen positive by toxin presence   I do not appreciate symptomatology of pna Given cdiff infection would stop systemic abx. Furthermore studies had shown mild CAP non-hypoxic 3 day abx enough  Plan: Finish 10 days difficid Stop azith/ceftriaxone  Maintain enteric cdiff precaution Will sign off Discuss with primary team       ------------------------------------------------ Principal Problem:   Chest pain Active Problems:   CAD (coronary artery disease), native coronary artery   HTN (hypertension)   PAF (paroxysmal atrial fibrillation) (HCC)   Carcinoma of prostate (HCC)   Macrocytic anemia   Anxiety   Current use of long term anticoagulation   CKD-3A   Pacemaker   CAP (community acquired pneumonia)   Acute respiratory failure with hypoxia (HCC)   Elevated brain natriuretic peptide (BNP) level   Atrial  tachycardia    HPI: Todd Salazar is a 88 y.o. male cad, afib off eliquis  due to hx gib, colon cancer, prostate cancer, restrictive lung disease, bronchiectasis, ckd3, admitted 12/25 with left sided chest pain/dyspnea for [redacted] weeks along with new n/v/x2 days found to have cdiff  Recent admission 12/20 treated as pna  Cta negative for pe at that time: 1. No pulmonary embolus. 2. Small to moderate size subpleural consolidation in the posterior right lower lobe, suspicious for pneumonia. 3. Mild patchy ground-glass opacity in the dependent left lower lobe, likely atelectasis. 4. Dilated main pulmonary artery, suggesting pulmonary arterial hypertension. Chronic cardiomegaly.  Ongoing chest dyscomfort and new n/v and diarrhea so came for evaluation Afebrile this admission Wbc 18 on presentation resolved on hd2 Cxr unchanged No cough  Cdiff testing positive Bcx negative On systemic cap coverage abx and started on difficid a day prior Ongoing n/v    Family History  Problem Relation Age of Onset   Hypertension Father    CAD Neg Hx    Heart failure Neg Hx    Stroke Neg Hx     Social History[1]  Allergies[2]  Review of Systems: ROS All Other ROS was negative, except mentioned above   Past Medical History:  Diagnosis Date   Arthritis    Atrial fibrillation (HCC)    Cancer (HCC)    prostate   Dysrhythmia    afib   History of kidney stones        Scheduled Meds:  apixaban   2.5 mg Oral BID   azithromycin   500 mg Oral Daily   Chlorhexidine  Gluconate Cloth  6  each Topical Daily   ferrous sulfate   325 mg Oral QODAY   fidaxomicin   200 mg Oral BID   folic acid   1 mg Oral Daily   mupirocin  ointment  1 Application Nasal BID   sodium chloride  flush  3 mL Intravenous Q12H   sodium chloride  flush  3 mL Intravenous Q12H   sucralfate   1 g Oral TID WC & HS   Continuous Infusions:  sodium chloride  100 mL/hr at 12/08/24 1604   cefTRIAXone  (ROCEPHIN )  IV Stopped (12/08/24  1512)   famotidine  (PEPCID ) IV Stopped (12/08/24 1237)   promethazine  (PHENERGAN ) injection (IM or IVPB) Stopped (12/08/24 1032)   PRN Meds:.acetaminophen  **OR** acetaminophen , bisacodyl , hydrALAZINE , ipratropium, nitroGLYCERIN , ondansetron  **OR** ondansetron  (ZOFRAN ) IV, mouth rinse, oxyCODONE , promethazine  (PHENERGAN ) injection (IM or IVPB), senna-docusate, traZODone    OBJECTIVE: Blood pressure (!) 107/54, pulse 68, temperature 97.6 F (36.4 C), temperature source Oral, resp. rate 19, height 6' 3 (1.905 m), weight 75.4 kg, SpO2 99%.  Physical Exam  General/constitutional: no distress, pleasant HEENT: Normocephalic, PER, Conj Clear, EOMI, Oropharynx clear Neck supple CV: rrr no mrg Lungs: clear to auscultation, normal respiratory effort Abd: Soft, Nontender Ext: no edema Skin: No Rash Neuro: nonfocal MSK: no peripheral joint swelling/tenderness/warmth; back spines nontender    Lab Results Lab Results  Component Value Date   WBC 9.1 12/08/2024   HGB 8.2 (L) 12/08/2024   HCT 25.0 (L) 12/08/2024   MCV 97.7 12/08/2024   PLT 183 12/08/2024    Lab Results  Component Value Date   CREATININE 3.66 (H) 12/08/2024   BUN 33 (H) 12/08/2024   NA 135 12/08/2024   K 4.5 12/08/2024   CL 104 12/08/2024   CO2 20 (L) 12/08/2024    Lab Results  Component Value Date   ALT 11 12/06/2024   AST 15 12/06/2024   ALKPHOS 100 12/06/2024   BILITOT 1.2 12/06/2024      Microbiology: Recent Results (from the past 240 hours)  Resp panel by RT-PCR (RSV, Flu A&B, Covid) Anterior Nasal Swab     Status: None   Collection Time: 11/30/24  5:10 PM   Specimen: Anterior Nasal Swab  Result Value Ref Range Status   SARS Coronavirus 2 by RT PCR NEGATIVE NEGATIVE Final    Comment: (NOTE) SARS-CoV-2 target nucleic acids are NOT DETECTED.  The SARS-CoV-2 RNA is generally detectable in upper respiratory specimens during the acute phase of infection. The lowest concentration of SARS-CoV-2 viral  copies this assay can detect is 138 copies/mL. A negative result does not preclude SARS-Cov-2 infection and should not be used as the sole basis for treatment or other patient management decisions. A negative result may occur with  improper specimen collection/handling, submission of specimen other than nasopharyngeal swab, presence of viral mutation(s) within the areas targeted by this assay, and inadequate number of viral copies(<138 copies/mL). A negative result must be combined with clinical observations, patient history, and epidemiological information. The expected result is Negative.  Fact Sheet for Patients:  bloggercourse.com  Fact Sheet for Healthcare Providers:  seriousbroker.it  This test is no t yet approved or cleared by the [place]  FDA and  has been authorized for detection and/or diagnosis of SARS-CoV-2 by FDA under an Emergency Use Authorization (EUA). This EUA will remain  in effect (meaning this test can be used) for the duration of the COVID-19 declaration under Section 564(b)(1) of the Act, 21 U.S.C.section 360bbb-3(b)(1), unless the authorization is terminated  or revoked sooner.  Influenza A by PCR NEGATIVE NEGATIVE Final   Influenza B by PCR NEGATIVE NEGATIVE Final    Comment: (NOTE) The Xpert Xpress SARS-CoV-2/FLU/RSV plus assay is intended as an aid in the diagnosis of influenza from Nasopharyngeal swab specimens and should not be used as a sole basis for treatment. Nasal washings and aspirates are unacceptable for Xpert Xpress SARS-CoV-2/FLU/RSV testing.  Fact Sheet for Patients: bloggercourse.com  Fact Sheet for Healthcare Providers: seriousbroker.it  This test is not yet approved or cleared by the [place]  FDA and has been authorized for detection and/or diagnosis of SARS-CoV-2 by FDA under an Emergency Use Authorization (EUA). This  EUA will remain in effect (meaning this test can be used) for the duration of the COVID-19 declaration under Section 564(b)(1) of the Act, 21 U.S.C. section 360bbb-3(b)(1), unless the authorization is terminated or revoked.     Resp Syncytial Virus by PCR NEGATIVE NEGATIVE Final    Comment: (NOTE) Fact Sheet for Patients: bloggercourse.com  Fact Sheet for Healthcare Providers: seriousbroker.it  This test is not yet approved or cleared by the [place]  FDA and has been authorized for detection and/or diagnosis of SARS-CoV-2 by FDA under an Emergency Use Authorization (EUA). This EUA will remain in effect (meaning this test can be used) for the duration of the COVID-19 declaration under Section 564(b)(1) of the Act, 21 U.S.C. section 360bbb-3(b)(1), unless the authorization is terminated or revoked.  Performed at [facility], [address], [place], [place] [address]   Blood Culture (routine x 2)     Status: None (Preliminary result)   Collection Time: 12/05/24  3:10 PM   Specimen: Right Antecubital; Blood  Result Value Ref Range Status   Specimen Description   Final    RIGHT ANTECUBITAL Performed at [facility], [address] [place], [place] [address]    Special Requests   Final    BOTTLES DRAWN AEROBIC AND ANAEROBIC Blood Culture adequate volume Performed at [facility], [address], [place], [place] [address]    Culture   Final    NO GROWTH 3 DAYS Performed at [facility], [address], [place], [place] [address]    Report Status PENDING  Incomplete  Blood Culture (routine x 2)     Status: None (Preliminary result)   Collection Time: 12/05/24  3:15 PM   Specimen: BLOOD RIGHT FOREARM  Result Value Ref Range Status   Specimen Description   Final    BLOOD RIGHT FOREARM Performed at [facility], [address],  [place], [place] [address]    Special Requests   Final    BOTTLES DRAWN AEROBIC ONLY BLOOD RIGHT FOREARM Performed at [facility], [address], [place], [place] [address]    Culture   Final    NO GROWTH 3 DAYS Performed at [facility], [address] [place], [place] [address]    Report Status PENDING  Incomplete  MRSA Next Gen by PCR, Nasal     Status: Abnormal   Collection Time: 12/05/24 10:08 PM   Specimen: Nasal Mucosa; Nasal Swab  Result Value Ref Range Status   MRSA by PCR Next Gen DETECTED (A) NOT DETECTED Final    Comment: RESULT CALLED TO, READ BACK BY AND VERIFIED WITH: JONETTA LESCHES, RN [phone] 12/06/24 BY ALONSO CORDS (NOTE) The GeneXpert MRSA Assay (FDA approved for NASAL specimens only), is one component of a comprehensive MRSA colonization surveillance program. It is  not intended to diagnose MRSA infection nor to guide or monitor treatment for MRSA infections. Test performance is not FDA approved in patients less than 45 years old. Performed at [facility], [address], [place], [place] [address]   C Difficile Quick Screen w PCR reflex     Status: Abnormal   Collection Time: 12/07/24 10:58 AM   Specimen: STOOL  Result Value Ref Range Status   C Diff antigen POSITIVE (A) NEGATIVE Final   C Diff toxin POSITIVE (A) NEGATIVE Final   C Diff interpretation Toxin producing C. difficile detected.  Final    Comment: CRITICAL RESULT CALLED TO, READ BACK BY AND VERIFIED WITH: KATHEE KURTZ, RN ON 12/07/2024 AT 1131 BY SL Performed at [facility], [address] [place], [place] [address]   Gastrointestinal Panel by PCR , Stool     Status: None   Collection Time: 12/07/24 10:58 AM   Specimen: STOOL  Result Value Ref Range Status   Campylobacter species NOT DETECTED NOT DETECTED Final   Plesimonas shigelloides NOT DETECTED NOT DETECTED Final   Salmonella species NOT DETECTED NOT DETECTED Final   Yersinia  enterocolitica NOT DETECTED NOT DETECTED Final   Vibrio species NOT DETECTED NOT DETECTED Final   Vibrio cholerae NOT DETECTED NOT DETECTED Final   Enteroaggregative E coli (EAEC) NOT DETECTED NOT DETECTED Final   Enteropathogenic E coli (EPEC) NOT DETECTED NOT DETECTED Final   Enterotoxigenic E coli (ETEC) NOT DETECTED NOT DETECTED Final   Shiga like toxin producing E coli (STEC) NOT DETECTED NOT DETECTED Final   Shigella/Enteroinvasive E coli (EIEC) NOT DETECTED NOT DETECTED Final   Cryptosporidium NOT DETECTED NOT DETECTED Final   Cyclospora cayetanensis NOT DETECTED NOT DETECTED Final   Entamoeba histolytica NOT DETECTED NOT DETECTED Final   Giardia lamblia NOT DETECTED NOT DETECTED Final   Adenovirus F40/41 NOT DETECTED NOT DETECTED Final   Astrovirus NOT DETECTED NOT DETECTED Final   Norovirus GI/GII NOT DETECTED NOT DETECTED Final   Rotavirus A NOT DETECTED NOT DETECTED Final   Sapovirus (I, II, IV, and V) NOT DETECTED NOT DETECTED Final    Comment: Performed at [facility], [address] [place], [place] [address]     Serology:    Imaging: If present, new imagings (plain films, ct scans, and mri) have been personally visualized and interpreted; radiology reports have been reviewed. Decision making incorporated into the Impression / Recommendations.  12/25 cxr 1. Persistent right hilar consolidation that correlates with hilar lymph nodes/soft tissues density on prior CT angio chests.  12/25 us  renal 1. Bilateral echogenic kidneys which likely represents sequelae associated with medical renal disease. 2. Bilateral simple renal cysts.  12/28 kub Unremarkable bowel gas pattern   Constance ONEIDA Passer, MD [facility] [phone] pager    12/08/2024, 6:34 PM     [1]  Social History Tobacco Use   Smoking status: Former    Current packs/day: 0.00    Types: Cigarettes    Quit date: 12/10/1996    Years since  quitting: 28.0   Smokeless tobacco: Never   Tobacco comments:    Former smoker (08/27/2021)  Vaping Use   Vaping status: Never Used  Substance Use Topics   Alcohol use: No   Drug use: No  [2]  Allergies Allergen Reactions   Ciprofloxacin Anaphylaxis, Hives and Other (See Comments)    Dizziness, also   Tramadol Swelling and Other (See Comments)  Laryngeal edema   Duloxetine Other (See Comments)    Delusions    Gabapentin  Other (See Comments)    Affects eyesight and makes the patient very unsteady   Hydrocodone-Acetaminophen  Nausea And Vomiting   Nisoldipine Other (See Comments)    Pt unsure of reaction (Sular, for hypertension)   Benadryl [Diphenhydramine] Anxiety and Other (See Comments)    Panic attack    Methocarbamol Nausea Only   Ondansetron  Hcl Nausea Only   Sulfa Antibiotics Rash   "

## 2024-12-09 ENCOUNTER — Telehealth (HOSPITAL_COMMUNITY): Payer: Self-pay | Admitting: Pharmacy Technician

## 2024-12-09 ENCOUNTER — Other Ambulatory Visit (HOSPITAL_COMMUNITY): Payer: Self-pay

## 2024-12-09 DIAGNOSIS — R0782 Intercostal pain: Secondary | ICD-10-CM | POA: Diagnosis not present

## 2024-12-09 DIAGNOSIS — R079 Chest pain, unspecified: Secondary | ICD-10-CM | POA: Diagnosis not present

## 2024-12-09 DIAGNOSIS — R072 Precordial pain: Secondary | ICD-10-CM | POA: Diagnosis not present

## 2024-12-09 DIAGNOSIS — I2510 Atherosclerotic heart disease of native coronary artery without angina pectoris: Secondary | ICD-10-CM | POA: Diagnosis not present

## 2024-12-09 DIAGNOSIS — Z950 Presence of cardiac pacemaker: Secondary | ICD-10-CM | POA: Diagnosis not present

## 2024-12-09 DIAGNOSIS — I480 Paroxysmal atrial fibrillation: Secondary | ICD-10-CM | POA: Diagnosis not present

## 2024-12-09 DIAGNOSIS — I4719 Other supraventricular tachycardia: Secondary | ICD-10-CM | POA: Diagnosis not present

## 2024-12-09 LAB — CBC
HCT: 23.7 % — ABNORMAL LOW (ref 39.0–52.0)
Hemoglobin: 7.5 g/dL — ABNORMAL LOW (ref 13.0–17.0)
MCH: 31.4 pg (ref 26.0–34.0)
MCHC: 31.6 g/dL (ref 30.0–36.0)
MCV: 99.2 fL (ref 80.0–100.0)
Platelets: 176 K/uL (ref 150–400)
RBC: 2.39 MIL/uL — ABNORMAL LOW (ref 4.22–5.81)
RDW: 26.5 % — ABNORMAL HIGH (ref 11.5–15.5)
WBC: 6.1 K/uL (ref 4.0–10.5)
nRBC: 0 % (ref 0.0–0.2)

## 2024-12-09 LAB — BASIC METABOLIC PANEL WITH GFR
Anion gap: 10 (ref 5–15)
BUN: 36 mg/dL — ABNORMAL HIGH (ref 8–23)
CO2: 19 mmol/L — ABNORMAL LOW (ref 22–32)
Calcium: 7.9 mg/dL — ABNORMAL LOW (ref 8.9–10.3)
Chloride: 109 mmol/L (ref 98–111)
Creatinine, Ser: 3.55 mg/dL — ABNORMAL HIGH (ref 0.61–1.24)
GFR, Estimated: 16 mL/min — ABNORMAL LOW
Glucose, Bld: 88 mg/dL (ref 70–99)
Potassium: 4.1 mmol/L (ref 3.5–5.1)
Sodium: 138 mmol/L (ref 135–145)

## 2024-12-09 LAB — GLUCOSE, CAPILLARY: Glucose-Capillary: 84 mg/dL (ref 70–99)

## 2024-12-09 LAB — OCCULT BLOOD X 1 CARD TO LAB, STOOL: Fecal Occult Bld: POSITIVE — AB

## 2024-12-09 MED ORDER — AZITHROMYCIN 250 MG PO TABS
500.0000 mg | ORAL_TABLET | Freq: Every day | ORAL | Status: DC
  Filled 2024-12-09: qty 2

## 2024-12-09 MED ORDER — FAMOTIDINE IN NACL 20-0.9 MG/50ML-% IV SOLN
20.0000 mg | Freq: Every day | INTRAVENOUS | Status: DC
  Administered 2024-12-09: 20 mg via INTRAVENOUS
  Filled 2024-12-09 (×2): qty 50

## 2024-12-09 MED ORDER — SODIUM CHLORIDE 0.9 % IV SOLN: 2.0000 g | INTRAVENOUS | Status: DC

## 2024-12-09 NOTE — Telephone Encounter (Signed)
 Patient Product/process Development Scientist completed.    The patient is insured through [employer]/ [employer]. Patient has Medicare and is not eligible for a copay card, but may be able to apply for patient assistance or Medicare RX Payment Plan (Patient Must reach out to their plan, if eligible for payment plan), if available.    Ran test claim for Dificid  200 mg and the current 30 day co-pay is $0.00.   This test claim was processed through [facility]- copay amounts may vary at other pharmacies due to pharmacy/plan contracts, or as the patient moves through the different stages of their insurance plan.     Reyes Sharps, CPHT Pharmacy Technician Patient Advocate Specialist Lead [facility] Patient Advocate Team Direct Number: [phone]  Fax: [phone]

## 2024-12-09 NOTE — Progress Notes (Signed)
 [facility] Progress Note  Name: Todd Salazar MRN: [id number] DOB: 08-12-1936  Chief Complaint:  Chest pain  Subjective:  Feels ok today.  he had 770 ml uop over 12/28 charted and has had 750 ml uop thus far over 12/29.  he has been on normal saline at 100 ml/hr.    Review of systems:  He denies any shortness of breath or chest pain  Denies n/v Diarrhea is better per pt  ----------- Background on consult:   Siddh Vandeventer is an 88 y.o. male with a history of CASHD, h/o pericarditis in 2024, atrial fibrillation, colon cancer, HLD, RLS, prostace cancer, restrictive lung disease, CKDIII recently admitted with chest pain with CTA done at that time showing RLL PNA but no PE + hypoxia (off Eliquis  secondary to GI bleed. Patient was treated with rocephin  azithromycin  and home with oral abx (azithro and cefinir). CTA on 11/30/24. Patient now here with .chest pain found to be tachy and in afib with chest pain described as tightness since Saturday on the left side without radiation and not associated with nausea or vomiting.  BNP 4700 with CXR showing persistent right hilar consolidation and hilar LN treated empirically with abx. Creatinine at discharge was 1.12, and 1.09 at time of re-presentation but has slowly risen each day.  Of note: the son states all this started at Thanksgiving, not feeling well, eventually going to [facility] bec of black tarry stool -> EGD and subsequent neck pain -> evaluation at [facility] in [place]. Nausea also for a few weeks and now here with chest pain again, pleuritic in nature. Remote history of nephrolithiasis h/o ESWL last episode >5 years ago, no NSAID use and no ESRD/ CKD in the family.   Intake/Output Summary (Last 24 hours) at 12/09/2024 2114 Last data filed at 12/09/2024 1900 Gross per 24 hour  Intake 3070.53 ml  Output 1100 ml  Net 1970.53 ml    Vitals:  Vitals:   12/08/24 2100 12/09/24 0559 12/09/24 1457 12/09/24 1954  BP: (!) 107/48 (!) 109/50 (!)  129/53 (!) 111/56  Pulse: 72 71 80 71  Resp: 16 15 20 16   Temp: 98.6 F (37 C) 98.9 F (37.2 C) 99.1 F (37.3 C) 98 F (36.7 C)  TempSrc: Oral Oral Oral Oral  SpO2: 95% 95% 98% 93%  Weight:      Height:         Physical Exam:  General elderly male in bed in no acute distress HEENT normocephalic atraumatic extraocular movements intact sclera anicteric Neck supple trachea midline Lungs clear to auscultation bilaterally normal work of breathing at rest on room air  Heart S1S2 no rub Abdomen soft nontender nondistended Extremities no edema  Psych normal mood and affect Neuro alert and oriented x 3 provides hx and follows commands [place] in place  Medications reviewed   Labs:     Latest Ref Rng & Units 12/09/2024    4:02 AM 12/08/2024    4:34 AM 12/07/2024   12:08 AM  BMP  Glucose 70 - 99 mg/dL 88  99  880   BUN 8 - 23 mg/dL 36  33  24   Creatinine 0.61 - 1.24 mg/dL 6.44  6.33  7.60   Sodium 135 - 145 mmol/L 138  135  138   Potassium 3.5 - 5.1 mmol/L 4.1  4.5  4.8   Chloride 98 - 111 mmol/L 109  104  103   CO2 22 - 32 mmol/L 19  20  22  Calcium  8.9 - 10.3 mg/dL 7.9  7.9  8.5      Assessment/Plan:   Assessment/Plan: AKI secondary to hypotension in the setting of infection and ATN.  Note baseline Cr 1.1 - 1.3.  U/S RK10.1 LK 11.5 with diffusely increased echogenicity. Bladder scan with 138 ml and then 67 mL - continue fluids as tolerated - on NS at 100 ml/hr - Also broached subject of dialysis; no indication at this current time and hopeful for plateau  -Monitor Daily I/Os, Daily weight  -Maintain MAP>65 for optimal renal perfusion.    CAP recently treated with course of abx. Chest pain thought to be secondary to PNA or inflammation.  Ckd stage 3a baseline Cr 1.1 - 1.3 PAF -h/o ablation with  Eliquis  recently held because of GIB and then patient later restarted; per primary team  HTN - hypotensive here - stable to improved; gentle fluids as tolerated R/o MI  with h/o CASHD CDif colitis being treated by the primary team. Normocytic anemia - PRBC's per primary team.  Iron sats 24%.  Low serum folate and on folic acid    Disposition - continue inpatient monitoring   Katheryn JAYSON Saba, MD 12/09/2024 9:34 PM

## 2024-12-09 NOTE — Progress Notes (Signed)
 "  PROGRESS NOTE  Todd Salazar FMW:[id number] DOB: Jun 16, 1936 DOA: 12/05/2024 PCP: Patient, No Pcp Per  HPI/Recap of past 24 hours: Todd Salazar is a 88 y.o. male with medical history significant of anemia, CAD, A-fib (has been off Eliquis  secondary to GI bleed), colon cancer, HLD, RLS, prostate cancer, restrictive lung disease, bronchiectasis, CKD stage III presenting with persistent left sided chest pain with dyspnea, ongoing for the past couple of weeks, now with nausea and recurrent vomiting for the past couple of days.  Of note, patient has been in and out of hospitals including [facility] as well as current health.  Last admission in 11/30/2024 for pneumonia and associated chest pain/shortness of breath, was discharged 12/21.  Presented again on 12/25 for persistent chest pain with orthopnea and dyspnea as well as decreased appetite with nausea and recurrent vomiting.  In the ED, VS fairly stable with some noted tachycardia. Labs showed fairly stable except for BNP 500-->4,689.0, troponin 30, flat trend.  Cardiology consulted.  Patient admitted for further management.    Today, patient still with diarrhea, denies any further nausea/vomiting.  Denies any obvious signs of bleeding    Assessment/Plan: Principal Problem:   Chest pain Active Problems:   CAD (coronary artery disease), native coronary artery   Carcinoma of prostate (HCC)   CAP (community acquired pneumonia)   Macrocytic anemia   Anxiety   Current use of long term anticoagulation   Acute respiratory failure with hypoxia (HCC)   Elevated brain natriuretic peptide (BNP) level   HTN (hypertension)   PAF (paroxysmal atrial fibrillation) (HCC)   CKD-3A   Pacemaker   Atrial tachycardia   C. difficile colitis   Chest pain History of CAD ??  Recurrent pneumonia, ?  Aspiration, ?  Pericarditis Noted restrictive lung disease Unknown etiology, unlikely ACS, rule out pericarditis given history Afebrile, with  resolved leukocytosis Troponin minimally elevated with flat trend EKG with no acute ST changes Echo done 12/01/2024 showed EF of 60 to 65%, no regional wall motion abnormality, right ventricular systolic function was normal BNP elevated  500-->4,689 LDL 38 Chest x-ray negative for volume overload, questionable persistent pneumonia CRP 5.2, ESR 22 Procalcitonin, lactic acid WNL CT chest/abdomen showed stable small focal airspace consolidation in the posterior right lower lobe, questionable focal wall thickening in the mid ascending colon without acute inflammatory stranding.  Diffuse colonic diverticulosis.  Consider colonoscopy for further evaluation SLP consulted Cardiology consulted, appreciate recs Discontinue ceftriaxone , azithromycin  in light of C. difficile as per ID DuoNebs as needed Telemetry, monitor closely  C. difficile colitis Intractable nausea/vomiting Reported diarrhea, positive for C. Difficile CT a/p showed questionable focal wall thickening in the mid ascending colon without acute inflammatory stranding.  Diffuse colonic diverticulosis.  Consider colonoscopy for further evaluation once fully recovered.  Also noted bilateral renal cysts, largest in the left kidney measuring 4.1 cm with numerous punctate left renal calculi Continue Dificid  IV fluid, antiemetics, monitor closely  AKI on CKD stage IIIa ATN possibly from C. difficile colitis/hypotension Baseline creatinine around 1.1--> 3.6-->3.55 Likely 2/2 above CT A/P showed bilateral renal cysts, numerous punctate left renal calculi Renal ultrasound with bilateral echogenic kidneys, bilateral simple renal cysts Nephrology consulted, appreciate recs Continue IV fluid Daily BMP  HTN Currently soft Continue IV hydration Discontinue metoprolol , hold amiodarone  Monitor closely  Persistent A-fib SSS s/p PPM History of atrial tachycardia/PVCs Currently in atrial tachycardia History of ablation Currently rate  controlled Cardiology on board, okay to holding amiodarone , held Eliquis  due to  drop in hemoglobin Of note, patient has failed sotalol , flecainide , Tikosyn  and propafenone, has not also tolerated AV nodal blocking agents Telemetry  Normocytic anemia/anemia of chronic disease Folate deficiency History of GI bleed Hemoglobin dropping, 7.5 FOBT positive Anemia panel done 11/30/2024 showed iron 43, ferritin greater than 1000, folate 4, vitamin B12 greater than 1000 Continue folic acid  Hold Eliquis  for now Daily CBC   Carcinoma of prostate In remission, follow-up as an outpatient        Wound 12/06/24 1323 Pressure Injury Perineum Medial Stage 2 -  Partial thickness loss of dermis presenting as a shallow open injury with a red, pink wound bed without slough. (Active)          Estimated body mass index is 20.78 kg/m as calculated from the following:   Height as of this encounter: 6' 3 (1.905 m).   Weight as of this encounter: 75.4 kg.     Code Status: Full  Family Communication: None at bedside  Disposition Plan: Status is: Inpatient Remains inpatient appropriate because: Level of care      Consultants: Cardiology Nephrology ID  Procedures: None  Antimicrobials: Dificid   DVT prophylaxis: Hold Eliquis    Objective: Vitals:   12/08/24 1403 12/08/24 2100 12/09/24 0559 12/09/24 1457  BP: (!) 107/54 (!) 107/48 (!) 109/50 (!) 129/53  Pulse: 68 72 71 80  Resp: 19 16 15 20   Temp: 97.6 F (36.4 C) 98.6 F (37 C) 98.9 F (37.2 C) 99.1 F (37.3 C)  TempSrc: Oral Oral Oral Oral  SpO2: 99% 95% 95% 98%  Weight:      Height:        Intake/Output Summary (Last 24 hours) at 12/09/2024 1734 Last data filed at 12/09/2024 1559 Gross per 24 hour  Intake 2977.67 ml  Output 700 ml  Net 2277.67 ml   Filed Weights   12/06/24 0422 12/07/24 0500 12/08/24 0500  Weight: 75.9 kg 75.2 kg 75.4 kg    Exam: General: NAD  Cardiovascular: S1, S2  present Respiratory: CTAB Abdomen: Soft, nontender, nondistended, bowel sounds present Musculoskeletal: No bilateral pedal edema noted Skin: Normal Psychiatry: Normal mood     Data Reviewed: CBC: Recent Labs  Lab 12/05/24 1521 12/06/24 0410 12/07/24 0008 12/08/24 0734 12/09/24 0402  WBC 5.8 18.6* 16.2* 9.1 6.1  HGB 8.9* 10.2* 9.6* 8.2* 7.5*  HCT 27.1* 30.8* 28.9* 25.0* 23.7*  MCV 96.1 96.3 96.3 97.7 99.2  PLT 248 290 247 183 176   Basic Metabolic Panel: Recent Labs  Lab 12/05/24 1521 12/05/24 1848 12/06/24 0410 12/06/24 1045 12/07/24 0008 12/08/24 0434 12/09/24 0402  NA 137  --  140  --  138 135 138  K 4.5  --  4.9  --  4.8 4.5 4.1  CL 107  --  104  --  103 104 109  CO2 20*  --  21*  --  22 20* 19*  GLUCOSE 92  --  102*  --  119* 99 88  BUN 13  --  14  --  24* 33* 36*  CREATININE 1.09  --  1.29*  --  2.39* 3.66* 3.55*  CALCIUM  9.0  --  9.4  --  8.5* 7.9* 7.9*  MG  --  1.6*  --  2.4  --   --   --   PHOS  --  2.7  --   --   --   --   --    GFR: Estimated Creatinine Clearance: 15.9 mL/min (A) (by C-G formula  based on SCr of 3.55 mg/dL (H)). Liver Function Tests: Recent Labs  Lab 12/05/24 1521 12/06/24 0410  AST 17 15  ALT 12 11  ALKPHOS 84 100  BILITOT 1.6* 1.2  PROT 6.7 7.4  ALBUMIN 4.0 4.3   No results for input(s): LIPASE, AMYLASE in the last 168 hours. No results for input(s): AMMONIA in the last 168 hours. Coagulation Profile: Recent Labs  Lab 12/05/24 1521  INR 1.6*   Cardiac Enzymes: No results for input(s): CKTOTAL, CKMB, CKMBINDEX, TROPONINI in the last 168 hours.  BNP (last 3 results) Recent Labs    12/05/24 1522 12/06/24 0410 12/07/24 0008  PROBNP 4,689.0* 4,621.0* 6,363.0*   HbA1C: No results for input(s): HGBA1C in the last 72 hours. CBG: Recent Labs  Lab 12/06/24 0816 12/07/24 0810 12/08/24 0720 12/09/24 0803  GLUCAP 94 104* 108* 84   Lipid Profile: Recent Labs    12/07/24 0008  CHOL 92  HDL 36*   LDLCALC 38  TRIG 88  CHOLHDL 2.6   Thyroid  Function Tests: No results for input(s): TSH, T4TOTAL, FREET4, T3FREE, THYROIDAB in the last 72 hours. Anemia Panel: No results for input(s): VITAMINB12, FOLATE, FERRITIN, TIBC, IRON, RETICCTPCT in the last 72 hours. Urine analysis:    Component Value Date/Time   COLORURINE YELLOW 12/08/2024 1650   APPEARANCEUR HAZY (A) 12/08/2024 1650   LABSPEC 1.018 12/08/2024 1650   PHURINE 5.0 12/08/2024 1650   GLUCOSEU NEGATIVE 12/08/2024 1650   HGBUR NEGATIVE 12/08/2024 1650   BILIRUBINUR NEGATIVE 12/08/2024 1650   KETONESUR NEGATIVE 12/08/2024 1650   PROTEINUR NEGATIVE 12/08/2024 1650   UROBILINOGEN 1.0 12/11/2011 0005   NITRITE NEGATIVE 12/08/2024 1650   LEUKOCYTESUR NEGATIVE 12/08/2024 1650   Sepsis Labs: @LABRCNTIP (procalcitonin:4,lacticidven:4)  ) Recent Results (from the past 240 hours)  Resp panel by RT-PCR (RSV, Flu A&B, Covid) Anterior Nasal Swab     Status: None   Collection Time: 11/30/24  5:10 PM   Specimen: Anterior Nasal Swab  Result Value Ref Range Status   SARS Coronavirus 2 by RT PCR NEGATIVE NEGATIVE Final    Comment: (NOTE) SARS-CoV-2 target nucleic acids are NOT DETECTED.  The SARS-CoV-2 RNA is generally detectable in upper respiratory specimens during the acute phase of infection. The lowest concentration of SARS-CoV-2 viral copies this assay can detect is 138 copies/mL. A negative result does not preclude SARS-Cov-2 infection and should not be used as the sole basis for treatment or other patient management decisions. A negative result may occur with  improper specimen collection/handling, submission of specimen other than nasopharyngeal swab, presence of viral mutation(s) within the areas targeted by this assay, and inadequate number of viral copies(<138 copies/mL). A negative result must be combined with clinical observations, patient history, and epidemiological information. The expected  result is Negative.  Fact Sheet for Patients:  bloggercourse.com  Fact Sheet for Healthcare Providers:  seriousbroker.it  This test is no t yet approved or cleared by the [place]  FDA and  has been authorized for detection and/or diagnosis of SARS-CoV-2 by FDA under an Emergency Use Authorization (EUA). This EUA will remain  in effect (meaning this test can be used) for the duration of the COVID-19 declaration under Section 564(b)(1) of the Act, 21 U.S.C.section 360bbb-3(b)(1), unless the authorization is terminated  or revoked sooner.       Influenza A by PCR NEGATIVE NEGATIVE Final   Influenza B by PCR NEGATIVE NEGATIVE Final    Comment: (NOTE) The Xpert Xpress SARS-CoV-2/FLU/RSV plus assay is intended as an aid  in the diagnosis of influenza from Nasopharyngeal swab specimens and should not be used as a sole basis for treatment. Nasal washings and aspirates are unacceptable for Xpert Xpress SARS-CoV-2/FLU/RSV testing.  Fact Sheet for Patients: bloggercourse.com  Fact Sheet for Healthcare Providers: seriousbroker.it  This test is not yet approved or cleared by the [place]  FDA and has been authorized for detection and/or diagnosis of SARS-CoV-2 by FDA under an Emergency Use Authorization (EUA). This EUA will remain in effect (meaning this test can be used) for the duration of the COVID-19 declaration under Section 564(b)(1) of the Act, 21 U.S.C. section 360bbb-3(b)(1), unless the authorization is terminated or revoked.     Resp Syncytial Virus by PCR NEGATIVE NEGATIVE Final    Comment: (NOTE) Fact Sheet for Patients: bloggercourse.com  Fact Sheet for Healthcare Providers: seriousbroker.it  This test is not yet approved or cleared by the [place]  FDA and has been authorized for detection and/or diagnosis of  SARS-CoV-2 by FDA under an Emergency Use Authorization (EUA). This EUA will remain in effect (meaning this test can be used) for the duration of the COVID-19 declaration under Section 564(b)(1) of the Act, 21 U.S.C. section 360bbb-3(b)(1), unless the authorization is terminated or revoked.  Performed at [facility], [address], [place], [place] [address]   Blood Culture (routine x 2)     Status: None (Preliminary result)   Collection Time: 12/05/24  3:10 PM   Specimen: Right Antecubital; Blood  Result Value Ref Range Status   Specimen Description   Final    RIGHT ANTECUBITAL Performed at [facility], [address] [place], [place] [address]    Special Requests   Final    BOTTLES DRAWN AEROBIC AND ANAEROBIC Blood Culture adequate volume Performed at [facility], [address] [place], [place] [address]    Culture   Final    NO GROWTH 4 DAYS Performed at [facility], [address] [place], [place] [address]    Report Status PENDING  Incomplete  Blood Culture (routine x 2)     Status: None (Preliminary result)   Collection Time: 12/05/24  3:15 PM   Specimen: BLOOD RIGHT FOREARM  Result Value Ref Range Status   Specimen Description   Final    BLOOD RIGHT FOREARM Performed at [facility], [address], [place], [place] [address]    Special Requests   Final    BOTTLES DRAWN AEROBIC ONLY BLOOD RIGHT FOREARM Performed at [facility], [address], [place], [place] [address]    Culture   Final    NO GROWTH 4 DAYS Performed at [facility], [address], [place], [place] [address]    Report Status PENDING  Incomplete  MRSA Next Gen by PCR, Nasal     Status: Abnormal   Collection Time: 12/05/24 10:08 PM   Specimen: Nasal Mucosa; Nasal Swab  Result Value Ref Range Status   MRSA by PCR Next Gen DETECTED (A) NOT DETECTED Final    Comment: RESULT CALLED  TO, READ BACK BY AND VERIFIED WITH: JONETTA LESCHES, RN [phone] 12/06/24 BY ALONSO CORDS (NOTE) The GeneXpert MRSA Assay (FDA approved for NASAL specimens only), is one component of a comprehensive MRSA colonization surveillance program. It is not intended to diagnose MRSA infection nor to guide or monitor treatment for MRSA infections. Test performance is not FDA approved in patients less than 38 years old. Performed at [facility], [address] [place], [place] [address]   C Difficile Quick Screen w PCR reflex     Status: Abnormal   Collection Time: 12/07/24 10:58 AM   Specimen: STOOL  Result Value Ref Range Status   C Diff antigen POSITIVE (A) NEGATIVE Final   C Diff toxin POSITIVE (A) NEGATIVE Final   C Diff interpretation Toxin producing C. difficile detected.  Final    Comment: CRITICAL RESULT CALLED TO, READ BACK BY AND VERIFIED WITH: KATHEE KURTZ, RN ON 12/07/2024 AT 1131 BY SL Performed at [facility], [address], [place], [place] [address]   Gastrointestinal Panel by PCR , Stool     Status: None   Collection Time: 12/07/24 10:58 AM   Specimen: STOOL  Result Value Ref Range Status   Campylobacter species NOT DETECTED NOT DETECTED Final   Plesimonas shigelloides NOT DETECTED NOT DETECTED Final   Salmonella species NOT DETECTED NOT DETECTED Final   Yersinia enterocolitica NOT DETECTED NOT DETECTED Final   Vibrio species NOT DETECTED NOT DETECTED Final   Vibrio cholerae NOT DETECTED NOT DETECTED Final   Enteroaggregative E coli (EAEC) NOT DETECTED NOT DETECTED Final   Enteropathogenic E coli (EPEC) NOT DETECTED NOT DETECTED Final   Enterotoxigenic E coli (ETEC) NOT DETECTED NOT DETECTED Final   Shiga like toxin producing E coli (STEC) NOT DETECTED NOT DETECTED Final   Shigella/Enteroinvasive E coli (EIEC) NOT DETECTED NOT DETECTED Final   Cryptosporidium NOT DETECTED NOT DETECTED Final   Cyclospora cayetanensis NOT DETECTED NOT DETECTED  Final   Entamoeba histolytica NOT DETECTED NOT DETECTED Final   Giardia lamblia NOT DETECTED NOT DETECTED Final   Adenovirus F40/41 NOT DETECTED NOT DETECTED Final   Astrovirus NOT DETECTED NOT DETECTED Final   Norovirus GI/GII NOT DETECTED NOT DETECTED Final   Rotavirus A NOT DETECTED NOT DETECTED Final   Sapovirus (I, II, IV, and V) NOT DETECTED NOT DETECTED Final    Comment: Performed at [facility], [address], [place], [place] [address]      Studies: No results found.   Scheduled Meds:  Chlorhexidine  Gluconate Cloth  6 each Topical Daily   ferrous sulfate   325 mg Oral QODAY   fidaxomicin   200 mg Oral BID   folic acid   1 mg Oral Daily   mupirocin  ointment  1 Application Nasal BID   sodium chloride  flush  3 mL Intravenous Q12H   sodium chloride  flush  3 mL Intravenous Q12H   sucralfate   1 g Oral TID WC & HS    Continuous Infusions:  sodium chloride  100 mL/hr at 12/09/24 1559   famotidine  (PEPCID ) IV Stopped (12/09/24 0846)   promethazine  (PHENERGAN ) injection (IM or IVPB) Stopped (12/08/24 1032)     LOS: 4 days     Lebron JINNY Cage, MD [facility]  If 7PM-7AM, please contact night-coverage [email or web address] 12/09/2024, 5:34 PM    "

## 2024-12-09 NOTE — Evaluation (Signed)
 Occupational Therapy Evaluation Patient Details Name: Todd Salazar MRN: [id number] DOB: July 01, 1936 Today's Date: 12/09/2024   History of Present Illness   88 yo male admitted with chest pain, possible Pna. Hx of CAD, CKD, restrictive lung disease, colon Ca, Afib, RLS, pacemaker, prostate Ca     Clinical Impressions Pt typically lives alone, walks with a RW and is modified independent in self care. He presents today with generalized weakness and impaired standing balance. No assist needed to come to EOB. Supervision to stand from bed with RW and CGA to step to [facility] with RW. Assisted pt with posterior pericare for thoroughness. Pt completed seated grooming with set up. Declined remaining OOB in chair for lunch, set up with lunch tray at bed level. He needs up to min assist for ADLs. Pt reports he has a lot of support when he returns home. Recommend resumption of HHOT upon discharge.     If plan is discharge home, recommend the following:   A little help with walking and/or transfers;A little help with bathing/dressing/bathroom;Assistance with cooking/housework;Assist for transportation;Help with stairs or ramp for entrance     Functional Status Assessment   Patient has had a recent decline in their functional status and demonstrates the ability to make significant improvements in function in a reasonable and predictable amount of time.     Equipment Recommendations   None recommended by OT     Recommendations for Other Services         Precautions/Restrictions   Precautions Precautions: Fall Restrictions Weight Bearing Restrictions Per Provider Order: No     Mobility Bed Mobility Overal bed mobility: Modified Independent             General bed mobility comments: HOB up, increased time    Transfers Overall transfer level: Needs assistance Equipment used: Rolling walker (2 wheels) Transfers: Sit to/from Stand, Bed to chair/wheelchair/BSC Sit to Stand:  Supervision     Step pivot transfers: Contact guard assist     General transfer comment: Cues for safety, hand placement.      Balance Overall balance assessment: Needs assistance   Sitting balance-Leahy Scale: Good     Standing balance support: Bilateral upper extremity supported, During functional activity, Reliant on assistive device for balance Standing balance-Leahy Scale: Fair                             ADL either performed or assessed with clinical judgement   ADL Overall ADL's : Needs assistance/impaired Eating/Feeding: Independent;Sitting   Grooming: Wash/dry hands;Wash/dry face;Sitting;Set up   Upper Body Bathing: Set up;Sitting   Lower Body Bathing: Sit to/from stand;Minimal assistance   Upper Body Dressing : Sitting;Set up   Lower Body Dressing: Sit to/from stand;Minimal assistance   Toilet Transfer: Minimal assistance;Stand-pivot;BSC/3in1;Rolling walker (2 wheels)   Toileting- Clothing Manipulation and Hygiene: Total assistance;Sit to/from stand Toileting - Clothing Manipulation Details (indicate cue type and reason): for thoroughness             Vision Ability to See in Adequate Light: 0 Adequate Patient Visual Report: No change from baseline       Perception         Praxis         Pertinent Vitals/Pain Pain Assessment Pain Assessment: No/denies pain     Extremity/Trunk Assessment Upper Extremity Assessment Upper Extremity Assessment: Right hand dominant;Generalized weakness   Lower Extremity Assessment Lower Extremity Assessment: Defer to PT evaluation  Communication Communication Communication: No apparent difficulties   Cognition Arousal: Alert Behavior During Therapy: Flat affect               OT - Cognition Comments: reports discrepancy in what he has been told by MD yesterday vs today                 Following commands: Intact       Cueing  General Comments   Cueing Techniques:  Verbal cues      Exercises     Shoulder Instructions      Home Living Family/patient expects to be discharged to:: Private residence Living Arrangements: Alone Available Help at Discharge: Family Type of Home: House Home Access: Level entry     Home Layout: One level     Bathroom Shower/Tub: Producer, Television/film/video: Handicapped height     Home Equipment: Agricultural Consultant (2 wheels);Cane - single point;Shower seat;Grab bars - tub/shower;Wheelchair - manual   Additional Comments: strong family and social support      Prior Functioning/Environment Prior Level of Function : Independent/Modified Independent;Driving             Mobility Comments: walks with RW ADLs Comments: Mod I for self care    OT Problem List: Decreased strength;Decreased activity tolerance;Impaired balance (sitting and/or standing)   OT Treatment/Interventions: Self-care/ADL training;Therapeutic activities;DME and/or AE instruction;Patient/family education;Balance training      OT Goals(Current goals can be found in the care plan section)   Acute Rehab OT Goals OT Goal Formulation: With patient Time For Goal Achievement: 12/23/24 Potential to Achieve Goals: Good ADL Goals Pt Will Perform Grooming: with modified independence;standing Pt Will Perform Lower Body Bathing: with modified independence;sit to/from stand Pt Will Perform Lower Body Dressing: with modified independence;sit to/from stand Pt Will Transfer to Toilet: with modified independence;ambulating;bedside commode Pt Will Perform Toileting - Clothing Manipulation and hygiene: with modified independence;sit to/from stand Additional ADL Goal #1: Pt will gather items necessary for ADLs around his room mod I with RW.   OT Frequency:  Min 2X/week    Co-evaluation              AM-PAC OT 6 Clicks Daily Activity     Outcome Measure Help from another person eating meals?: None Help from another person taking care of  personal grooming?: A Little Help from another person toileting, which includes using toliet, bedpan, or urinal?: A Lot Help from another person bathing (including washing, rinsing, drying)?: A Little Help from another person to put on and taking off regular upper body clothing?: A Little Help from another person to put on and taking off regular lower body clothing?: A Little 6 Click Score: 18   End of Session Equipment Utilized During Treatment: Gait belt;Rolling walker (2 wheels)  Activity Tolerance: Patient tolerated treatment well Patient left: in bed;with call bell/phone within reach;with bed alarm set  OT Visit Diagnosis: Unsteadiness on feet (R26.81);Other abnormalities of gait and mobility (R26.89);Muscle weakness (generalized) (M62.81)                Time: 8883-8867 OT Time Calculation (min): 16 min Charges:  OT General Charges $OT Visit: 1 Visit OT Evaluation $OT Eval Moderate Complexity: 1 Mod  Mliss HERO, OTR/L Acute Rehabilitation Services Office: [phone]   Todd Salazar 12/09/2024, 11:35 AM

## 2024-12-09 NOTE — Evaluation (Signed)
 Clinical/Bedside Swallow Evaluation Patient Details  Name: Todd Salazar MRN: [id number] Date of Birth: 07-Jan-1936  Today's Date: 12/09/2024 Time: SLP Start Time (ACUTE ONLY): 1026 SLP Stop Time (ACUTE ONLY): 1040 SLP Time Calculation (min) (ACUTE ONLY): 14 min  Past Medical History:  Past Medical History:  Diagnosis Date   Arthritis    Atrial fibrillation (HCC)    Cancer (HCC)    prostate   Dysrhythmia    afib   History of kidney stones    Past Surgical History:  Past Surgical History:  Procedure Laterality Date   ATRIAL FIBRILLATION ABLATION N/A 10/29/2021   Procedure: ATRIAL FIBRILLATION ABLATION;  Surgeon: Inocencio Soyla Lunger, MD;  Location: [facility] INVASIVE CV LAB;  Service: Cardiovascular;  Laterality: N/A;   ATRIAL FIBRILLATION ABLATION N/A 06/29/2023   Procedure: ATRIAL FIBRILLATION ABLATION;  Surgeon: Inocencio Soyla Lunger, MD;  Location: [facility] INVASIVE CV LAB;  Service: Cardiovascular;  Laterality: N/A;   Bone spur     CARDIOVERSION N/A 06/01/2022   Procedure: CARDIOVERSION;  Surgeon: Kate Lonni CROME, MD;  Location: [facility] ENDOSCOPY;  Service: Cardiovascular;  Laterality: N/A;   CHOLECYSTECTOMY     HERNIA REPAIR     JOINT REPLACEMENT     kidney stones     LAMINECTOMY WITH POSTERIOR LATERAL ARTHRODESIS LEVEL 1 Right 11/25/2019   Procedure: Laminectomy and Foraminotomy  Lumbar Four-Lumbar Five - right, with instrumented fusion;  Surgeon: Joshua Alm RAMAN, MD;  Location: [facility] OR;  Service: Neurosurgery;  Laterality: Right;  Laminectomy and Foraminotomy  Lumbar Four-Lumbar Five - right, with instrumented fusion   PROSTATE SURGERY     TEE WITHOUT CARDIOVERSION N/A 06/01/2022   Procedure: TRANSESOPHAGEAL ECHOCARDIOGRAM (TEE);  Surgeon: Kate Lonni CROME, MD;  Location: [facility] ENDOSCOPY;  Service: Cardiovascular;  Laterality: N/A;   HPI:  Todd Salazar is a 88 y.o. male who presented from home 12/25 with persistent left sided chest pain with dyspnea, ongoing for the past couple of  weeks, now with nausea and recurrent vomiting for the past couple of days. Found to be C Diff +. Chest CT 12/26 with findings of Stable small focal airspace consolidation in the posterior right lower lobe and Stable 3 mm right middle lobe nodule.  Recent hospitalization for esophageal rupture 11/14/24 with cliical recs for reg thin at that time. Pt with medical history significant of anemia, CAD, A-fib (has been off Eliquis  secondary to GI bleed), colon cancer, HLD, RLS, prostate cancer, restrictive lung disease, bronchiectasis, CKD stage III.    Assessment / Plan / Recommendation  Clinical Impression  Pt presents with functional swallowing as assessed clinically. Pt tolerated all consistencies trialed with no clinical s/s of aspiration and exhibited good oral clearance of solids. Per chart review ID, does not appreciate symptomatology of pna. Pt appears safe to advance to regular texture diet as appropriate from GI standpoint. Pt has no further ST needs. SLP will sign off.    Recommend regular texture diet with thin liquids.   SLP Visit Diagnosis: Dysphagia, unspecified (R13.10)    Aspiration Risk  No limitations    Diet Recommendation Regular;Thin liquid    Liquid Administration via: Cup;Straw Medication Administration: Whole meds with liquid Supervision: Patient able to self feed Compensations: Slow rate;Small sips/bites Postural Changes: Seated upright at 90 degrees    Other Recommendations Oral Care Recommendations: Oral care BID     Swallow Evaluation Recommendations  N/A   Assistance Recommended at Discharge  N/A  Functional Status Assessment Patient has not had a recent decline in their functional  status  Frequency and Duration  (N/A)          Prognosis Prognosis for improved oropharyngeal function:  (N/A)      Swallow Study   General Date of Onset: 12/05/24 HPI: Todd Salazar is a 88 y.o. male who presented from home 12/25 with persistent left sided chest pain with  dyspnea, ongoing for the past couple of weeks, now with nausea and recurrent vomiting for the past couple of days. Found to be C Diff +. Chest CT 12/26 with findings of Stable small focal airspace consolidation in the posterior right lower lobe and Stable 3 mm right middle lobe nodule.  Recent hospitalization for esophageal rupture 11/14/24 with cliical recs for reg thin at that time. Pt with medical history significant of anemia, CAD, A-fib (has been off Eliquis  secondary to GI bleed), colon cancer, HLD, RLS, prostate cancer, restrictive lung disease, bronchiectasis, CKD stage III. Previous Swallow Assessment: CSE 11/14/24: Reg/thin Diet Prior to this Study: Full liquid diet History of Recent Intubation: No Behavior/Cognition: Alert;Cooperative;Pleasant mood Oral Cavity Assessment: Within Functional Limits Oral Care Completed by SLP: No Oral Cavity - Dentition: Dentures, top Patient Positioning: Upright in bed Baseline Vocal Quality: Normal Volitional Cough:  (Fair) Volitional Swallow: Able to elicit    Oral/Motor/Sensory Function Overall Oral Motor/Sensory Function: Within functional limits   Ice Chips Ice chips: Not tested   Thin Liquid Thin Liquid: Within functional limits Presentation: Straw    Nectar Thick Nectar Thick Liquid: Not tested   Honey Thick Honey Thick Liquid: Not tested   Puree Puree: Within functional limits   Solid     Solid: Within functional limits      Todd FORBES Grippe, MA, CCC-SLP Acute Rehabilitation Services Office: [phone] 12/09/2024,11:00 AM

## 2024-12-09 NOTE — Progress Notes (Signed)
 Patient states Carafate  causes him to experience nausea. Education provided and Pt refused Carafate .

## 2024-12-09 NOTE — Progress Notes (Addendum)
"  °  Progress Note  Patient Name: Todd Salazar Date of Encounter: 12/09/2024 [facility] Cardiologist: Maude Emmer, MD   Interval Summary   Remains in sinus rhythm, no longer on amiodarone  due to AEs Reports unchanged chest discomfort -- remains worse with inspiration and TTP Eliquis  discontinued this AM per primary due to anemia   Vital Signs Vitals:   12/08/24 0505 12/08/24 1403 12/08/24 2100 12/09/24 0559  BP: (!) 91/45 (!) 107/54 (!) 107/48 (!) 109/50  Pulse: 67 68 72 71  Resp: 19 19 16 15   Temp: 98.6 F (37 C) 97.6 F (36.4 C) 98.6 F (37 C) 98.9 F (37.2 C)  TempSrc: Oral Oral Oral Oral  SpO2: 91% 99% 95% 95%  Weight:      Height:        Intake/Output Summary (Last 24 hours) at 12/09/2024 0910 Last data filed at 12/09/2024 9175 Gross per 24 hour  Intake 2394.57 ml  Output 1117 ml  Net 1277.57 ml      12/08/2024    5:00 AM 12/07/2024    5:00 AM 12/06/2024    4:22 AM  Last 3 Weights  Weight (lbs) 166 lb 3.6 oz 165 lb 12.6 oz 167 lb 5.3 oz  Weight (kg) 75.4 kg 75.2 kg 75.9 kg     Telemetry/ECG  Sinus rhythm, 1st degree AVB - Personally Reviewed  Physical Exam  GEN: No acute distress.   Neck: No JVD Cardiac: RRR, no murmurs, rubs, or gallops.  Respiratory: Clear to auscultation bilaterally. GI: Soft, nontender, non-distended  MS: No edema  Assessment & Plan   Persistent atrial fibrillation History of atrial tachycardia Sick sinus syndrome s/p PPM Started on IV amiodarone , plans to transition to oral with 400 mg twice daily x 5 days, 200 mg twice daily x 5 days, 200 mg daily thereafter Patient noted significant N/V with amiodarone , hold for now  He has previously failed flecainide , sotalol , Tikosyn , propafenone Currently in sinus with HR 60-70s Consider restarting IV amiodarone  if tachycardia returns Previously on Eliquis  2.5 mg BID (age, renal function) -- this was discontinued by primary MD this morning, suspect in the setting of anemia --  hemoglobin 8.2 ? 7.5 Continue to avoid AV nodal blocking agents  Chest discomfort Presented with chest discomfort that was tender to the touch CRP and ESR both mildly elevated No notable EKG changes suggestive of pericarditis Reports unchanged chest discomfort this admission  Per primary CAP Prostate cancer Acute respiratory failure with hypoxia Anxiety Anemia with recent GI bleed AKI on CKD 3a C. Difficile Hypotension  For questions or updates, please contact [facility] Please consult [email or web address] for contact info under    Signed, Waddell DELENA Donath, PA-C   Personally seen and examined. Agree with above.  Still little bit tender to palpation.  Perhaps with increased or deep breath. - Pacemaker working well. - Given his recent nausea, amiodarone  p.o. would not well be tolerated.  I am fine holding off of this.  Try to keep things simple.  If atrial tach returns, can always reconsider IV amiodarone  in the hospital setting.  Currently getting treated for C. Difficile  Apixaban  2.5 mg stopped because of increasing anemia hemoglobin 7.5.  AKI.  Creatinine stable today 3.5 from 3.6.  Oneil Parchment, MD  "

## 2024-12-09 NOTE — Progress Notes (Signed)
 [facility] Liaison Note:  This is a current home health patient with [facility] receiving (SN, PT, OT).  Liaison will continue to follow for discharge disposition.   Please enter resumption of care orders (SN, PT, OT) at discharge.  Please call with any questions or concerns. Thank you,   Eleanor Nail, LPN [facility] Liaison [phone]

## 2024-12-09 NOTE — Progress Notes (Signed)
 Physical Therapy Treatment Patient Details Name: Todd Salazar MRN: [id number] DOB: 16-May-1936 Today's Date: 12/09/2024   History of Present Illness 88 yo male admitted with chest pain, possible Pna. Hx of CAD, CKD, restrictive lung disease, colon Ca, Afib, RLS, pacemaker, prostate Ca    PT Comments  PT - Cognition Comments: AxO x 3 pleasant and following all instructions.  Feeling better.  Hope to go home soon, stated PT who lives with his Son and amb on a Rollator.  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  Assisted OOB to amb with improvement.  General bed mobility comments: Pt self able to get OOB and back into bed.  General transfer comment: Pt self able to transfer/rise with good use of hands to steady self as well as good safety cognition. General Gait Details: Pt tolerated an increased distance 1/2 unit 225 feet at Supervision level and no c/o's.  Feeling better.  No dyspnea.  Avg RA 97%.   Pt stated, he uses a Rollator at home but only when I go out (house).   LPT has rec [facility] PT   If plan is discharge home, recommend the following: A little help with walking and/or transfers;A little help with bathing/dressing/bathroom;Assistance with cooking/housework;Assist for transportation   Can travel by private vehicle        Equipment Recommendations  None recommended by PT    Recommendations for Other Services       Precautions / Restrictions Precautions Precautions: Fall Restrictions Weight Bearing Restrictions Per Provider Order: No     Mobility  Bed Mobility Overal bed mobility: Modified Independent             General bed mobility comments: Pt self able to get OOB and back into bed    Transfers Overall transfer level: Needs assistance   Transfers: Sit to/from Stand Sit to Stand: Supervision           General transfer comment: Pt self able to transfer/rise with good use of hands to steady self as well as good safety cognition.     Ambulation/Gait Ambulation/Gait assistance: Supervision Gait Distance (Feet): 225 Feet   Gait Pattern/deviations: Step-through pattern, Decreased stride length Gait velocity: decreased     General Gait Details: Pt tolerated an increased distance 1/2 unit 225 feet at Supervision level and no c/o's.  Feeling better.  Pt stated, he uses a Rollator at home but only when I go out (house).   Stairs             Wheelchair Mobility     Tilt Bed    Modified Rankin (Stroke Patients Only)       Balance                                            Communication Communication Communication: No apparent difficulties  Cognition   Behavior During Therapy: WFL for tasks assessed/performed   PT - Cognitive impairments: No apparent impairments                       PT - Cognition Comments: AxO x 3 pleasant and following all instructions.  Feeling better.  Hope to go home soon, stated PT who lives with his Son and amb on a Rollator.  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 Following commands: Intact      Cueing    Exercises  General Comments        Pertinent Vitals/Pain Pain Assessment Pain Assessment: No/denies pain    Home Living Family/patient expects to be discharged to:: Private residence Living Arrangements: Alone Available Help at Discharge: Family Type of Home: House Home Access: Level entry       Home Layout: One level Home Equipment: Agricultural Consultant (2 wheels);Cane - single point;Shower seat;Grab bars - tub/shower;Wheelchair - manual Additional Comments: strong family and social support    Prior Function            PT Goals (current goals can now be found in the care plan section) Progress towards PT goals: Progressing toward goals    Frequency    Min 3X/week      PT Plan      Co-evaluation              AM-PAC PT 6 Clicks Mobility   Outcome Measure  Help needed turning from your back to your  side while in a flat bed without using bedrails?: None Help needed moving from lying on your back to sitting on the side of a flat bed without using bedrails?: None Help needed moving to and from a bed to a chair (including a wheelchair)?: None Help needed standing up from a chair using your arms (e.g., wheelchair or bedside chair)?: None Help needed to walk in hospital room?: None Help needed climbing 3-5 steps with a railing? : A Little 6 Click Score: 23    End of Session Equipment Utilized During Treatment: Gait belt Activity Tolerance: Patient tolerated treatment well Patient left: in bed;with call bell/phone within reach;with chair alarm set;with family/visitor present Nurse Communication: Mobility status PT Visit Diagnosis: Unsteadiness on feet (R26.81);Other abnormalities of gait and mobility (R26.89);Muscle weakness (generalized) (M62.81)     Time: 8573-8554 PT Time Calculation (min) (ACUTE ONLY): 19 min  Charges:    $Gait Training: 8-22 mins PT General Charges $$ ACUTE PT VISIT: 1 Visit                    Katheryn Leap  PTA Acute  Rehabilitation Services Office M-F          [phone]

## 2024-12-10 DIAGNOSIS — Z950 Presence of cardiac pacemaker: Secondary | ICD-10-CM | POA: Diagnosis not present

## 2024-12-10 DIAGNOSIS — R072 Precordial pain: Secondary | ICD-10-CM | POA: Diagnosis not present

## 2024-12-10 DIAGNOSIS — I4719 Other supraventricular tachycardia: Secondary | ICD-10-CM | POA: Diagnosis not present

## 2024-12-10 DIAGNOSIS — I480 Paroxysmal atrial fibrillation: Secondary | ICD-10-CM

## 2024-12-10 DIAGNOSIS — R079 Chest pain, unspecified: Secondary | ICD-10-CM | POA: Diagnosis not present

## 2024-12-10 LAB — GLUCOSE, CAPILLARY: Glucose-Capillary: 104 mg/dL — ABNORMAL HIGH (ref 70–99)

## 2024-12-10 LAB — CBC
HCT: 20.7 % — ABNORMAL LOW (ref 39.0–52.0)
Hemoglobin: 6.7 g/dL — CL (ref 13.0–17.0)
MCH: 31.6 pg (ref 26.0–34.0)
MCHC: 32.4 g/dL (ref 30.0–36.0)
MCV: 97.6 fL (ref 80.0–100.0)
Platelets: 153 K/uL (ref 150–400)
RBC: 2.12 MIL/uL — ABNORMAL LOW (ref 4.22–5.81)
RDW: 26 % — ABNORMAL HIGH (ref 11.5–15.5)
WBC: 3.5 K/uL — ABNORMAL LOW (ref 4.0–10.5)
nRBC: 0 % (ref 0.0–0.2)

## 2024-12-10 LAB — CULTURE, BLOOD (ROUTINE X 2)
Culture: NO GROWTH
Culture: NO GROWTH
Special Requests: ADEQUATE

## 2024-12-10 LAB — BASIC METABOLIC PANEL WITH GFR
Anion gap: 10 (ref 5–15)
BUN: 34 mg/dL — ABNORMAL HIGH (ref 8–23)
CO2: 19 mmol/L — ABNORMAL LOW (ref 22–32)
Calcium: 7.9 mg/dL — ABNORMAL LOW (ref 8.9–10.3)
Chloride: 110 mmol/L (ref 98–111)
Creatinine, Ser: 3.01 mg/dL — ABNORMAL HIGH (ref 0.61–1.24)
GFR, Estimated: 20 mL/min — ABNORMAL LOW
Glucose, Bld: 94 mg/dL (ref 70–99)
Potassium: 3.8 mmol/L (ref 3.5–5.1)
Sodium: 140 mmol/L (ref 135–145)

## 2024-12-10 LAB — HEMOGLOBIN AND HEMATOCRIT, BLOOD
HCT: 24.3 % — ABNORMAL LOW (ref 39.0–52.0)
HCT: 26.4 % — ABNORMAL LOW (ref 39.0–52.0)
Hemoglobin: 7.9 g/dL — ABNORMAL LOW (ref 13.0–17.0)
Hemoglobin: 8.7 g/dL — ABNORMAL LOW (ref 13.0–17.0)

## 2024-12-10 LAB — PREPARE RBC (CROSSMATCH)

## 2024-12-10 LAB — ABO/RH: ABO/RH(D): A POS

## 2024-12-10 MED ORDER — FAMOTIDINE IN NACL 20-0.9 MG/50ML-% IV SOLN
20.0000 mg | Freq: Every day | INTRAVENOUS | Status: DC
  Administered 2024-12-10 – 2024-12-13 (×4): 20 mg via INTRAVENOUS
  Filled 2024-12-10 (×4): qty 50

## 2024-12-10 MED ORDER — FAMOTIDINE IN NACL 20-0.9 MG/50ML-% IV SOLN: 20.0000 mg | Freq: Two times a day (BID) | INTRAVENOUS | Status: DC

## 2024-12-10 MED ORDER — SODIUM CHLORIDE 0.9% IV SOLUTION: Freq: Once | INTRAVENOUS | Status: AC

## 2024-12-10 MED ORDER — FAMOTIDINE 20 MG PO TABS: 20.0000 mg | ORAL_TABLET | Freq: Every day | ORAL | Status: DC

## 2024-12-10 MED ORDER — ENSURE PLUS HIGH PROTEIN PO LIQD: 237.0000 mL | Freq: Two times a day (BID) | ORAL | Status: DC

## 2024-12-10 NOTE — TOC Initial Note (Signed)
 Transition of Care [facility]) - Initial/Assessment Note    Patient Details  Name: Todd Salazar MRN: [id number] Date of Birth: [birth year]  [facility] CM/SW Contact:    Tawni CHRISTELLA Eva, LCSW Phone Number: 12/10/2024, 8:37 AM  Clinical Narrative:                  Pt rec from home health services. Per chart review pt is active with [facility] for [facility] services. Will need resumption of care orders before d/c. ICM will continue to follow.    Expected Discharge Plan: Home w Home Health Services Barriers to Discharge: Continued Medical Work up   Patient Goals and CMS Choice            Expected Discharge Plan and Services                                              Prior Living Arrangements/Services                       Activities of Daily Living   ADL Screening (condition at time of admission) Independently performs ADLs?: Yes (appropriate for developmental age) Is the patient deaf or have difficulty hearing?: No Does the patient have difficulty seeing, even when wearing glasses/contacts?: No Does the patient have difficulty concentrating, remembering, or making decisions?: No  Permission Sought/Granted                  Emotional Assessment              Admission diagnosis:  Chest pain [R07.9] Patient Active Problem List   Diagnosis Date Noted   C. difficile colitis 12/08/2024   Atrial tachycardia 12/06/2024   Chest pain 12/05/2024   Elevated brain natriuretic peptide (BNP) level 12/05/2024   CAP (community acquired pneumonia) 11/30/2024   Acute respiratory failure with hypoxia (HCC) 11/30/2024   Hyperkalemia 11/30/2024   Pericarditis 07/01/2023   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   Pacemaker 07/01/2023   Atrial flutter with rapid ventricular response (HCC) 05/30/2022   CKD-3A 04/16/2022   Acute right hip pain 04/16/2022   Normocytic anemia 04/16/2022   Hypercoagulable state due to persistent atrial  fibrillation (HCC) 08/27/2021   S/P lumbar fusion 11/25/2019   Carcinoma of prostate (HCC) 09/07/2019   Lumbar radiculopathy 09/07/2019   Degeneration of lumbar intervertebral disc 09/07/2019   History of total replacement of both hip joints 08/01/2019   Iliotibial band syndrome of right side 07/27/2019   Macrocytic anemia 07/26/2019   Bronchiectasis without complication (HCC) 02/22/2019   DOE (dyspnea on exertion) 10/19/2018   Lung nodule < 6cm on CT 10/09/2018   Occipital neuritis 08/29/2016   Bilateral hearing loss 06/22/2016   Post herpetic neuralgia 02/02/2016   Current use of long term anticoagulation 12/18/2015   Calcification of spleen 09/13/2014   Diverticulosis 09/13/2014   Bilateral renal cysts 09/13/2014   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   Hemangioma of liver 09/13/2014   Male erectile dysfunction, unspecified 09/13/2014   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   Paraphimosis 09/13/2014   Polyp of colon 09/13/2014   Restless legs syndrome 09/13/2014   Spondylosis without myelopathy or radiculopathy, cervical region 09/13/2014   Urethral stricture 09/13/2014   Spondylosis without myelopathy or radiculopathy, lumbosacral region 09/13/2014   Anxiety 09/13/2014   Chest pain at rest 12/11/2011    Class: Acute  CAD (coronary artery disease), native coronary artery 12/11/2011    Class: Chronic   HTN (hypertension) 12/11/2011   PAF (paroxysmal atrial fibrillation) (HCC) 12/11/2011   PCP:  Patient, No Pcp Per Pharmacy:   [facility] - [place], [place] - [address] [place] [address] Phone: [phone] Fax: [phone]  [id number] - [place], [place] - [address] [place] [address] Phone: [phone] Fax: [phone]     Social Drivers of Health (SDOH) Social History: SDOH Screenings   Food Insecurity: No Food Insecurity (12/05/2024)  Housing: Low Risk (12/05/2024)  Transportation  Needs: No Transportation Needs (12/05/2024)  Utilities: Not At Risk (12/05/2024)  Social Connections: Moderately Integrated (12/05/2024)  Tobacco Use: Medium Risk (12/05/2024)   SDOH Interventions:     Readmission Risk Interventions    12/01/2024    2:21 PM 07/13/2023   12:34 PM  Readmission Risk Prevention Plan  Post Dischage Appt  Complete  Medication Screening  Complete  Transportation Screening Complete Complete  PCP or Specialist Appt within 5-7 Days Complete   Home Care Screening Complete   Medication Review (RN CM) Complete

## 2024-12-10 NOTE — Progress Notes (Addendum)
 "  Progress Note  Patient Name: Bristol Soy Date of Encounter: 12/10/2024 [facility] Cardiologist: Maude Emmer, MD   Interval Summary   Hemoglobin today 8.2 ? 7.5 ? 6.7, pRBC ordered per primary Reports improvement in chest discomfort Mainly feeling fatigue this AM About to receive unit of blood  Vital Signs Vitals:   12/09/24 0559 12/09/24 1457 12/09/24 1954 12/10/24 0433  BP: (!) 109/50 (!) 129/53 (!) 111/56 (!) 96/55  Pulse: 71 80 71 67  Resp: 15 20 16 18   Temp: 98.9 F (37.2 C) 99.1 F (37.3 C) 98 F (36.7 C) 98.9 F (37.2 C)  TempSrc: Oral Oral Oral   SpO2: 95% 98% 93% 94%  Weight:      Height:        Intake/Output Summary (Last 24 hours) at 12/10/2024 0923 Last data filed at 12/10/2024 9374 Gross per 24 hour  Intake 1492.92 ml  Output 690 ml  Net 802.92 ml      12/08/2024    5:00 AM 12/07/2024    5:00 AM 12/06/2024    4:22 AM  Last 3 Weights  Weight (lbs) 166 lb 3.6 oz 165 lb 12.6 oz 167 lb 5.3 oz  Weight (kg) 75.4 kg 75.2 kg 75.9 kg     Telemetry/ECG  Sinus, HR 70s - Personally Reviewed  Physical Exam  GEN: No acute distress.   Neck: No JVD Cardiac: RRR, no murmurs, rubs, or gallops.  Respiratory: Clear to auscultation bilaterally. GI: Soft, nontender, non-distended  MS: No edema  Assessment & Plan   Persistent atrial fibrillation History of atrial tachycardia Sick sinus syndrome s/p PPM Started on IV amiodarone , plans to transition to oral with 400 mg twice daily x 5 days, 200 mg twice daily x 5 days, 200 mg daily thereafter Patient noted significant N/V with amiodarone , hold for now  He has previously failed flecainide , sotalol , Tikosyn , propafenone Currently in sinus with HR 60-70s  Consider restarting IV amiodarone  if tachycardia returns Previously on Eliquis  2.5 mg BID (age, renal function) -- this was discontinued by primary MD this morning, suspect in the setting of anemia -- hemoglobin 8.2 ? 7.5 ? 6.7 Continue to avoid AV  nodal blocking agents   Chest discomfort Presented with chest discomfort that was tender to the touch CRP and ESR both mildly elevated No notable EKG changes suggestive of pericarditis Reports unchanged chest discomfort this admission   Per primary CAP Prostate cancer Acute respiratory failure with hypoxia Anxiety Anemia with recent GI bleed AKI on CKD 3a C. Difficile Hypotension  For questions or updates, please contact [facility] Please consult [email or web address] for contact info under       Signed, Waddell DELENA Donath, PA-C   Personally seen and examined. Agree with above.  88 year old with hemoglobin decreasing to 6.7 packed red blood cell ordered feeling fatigued prior history of GI bleed with several days of chest discomfort, tender to palpation intermittently.  Laying comfortably in bed, pale.  No increased work of breathing.  Persistent atrial fibrillation with atrial tachycardia pacemaker - He was on IV amiodarone  earlier this admission and converted from atrial tachycardia back to sinus rhythm.  We were going to give him p.o. amiodarone  however he started to feel worse with more nausea, C. difficile.  At this time avoiding amiodarone .  If atrial tach returns, we could always give him IV once again.  Severe anemia - Eliquis  2.5 mg on hold.  He has required this before. - Getting blood transfusion 1 unit. -  Of course he is concerned about the possibility of stroke without Eliquis .  Unfortunately, the risk of Eliquis  seems to outweigh the benefit at this point.  I would avoid Eliquis  at this time.  Chest discomfort - Probably musculoskeletal.  No changes made from a cardiology perspective.  We will go ahead and sign off.  Please let us  know if we can be of further assistance.  Oneil Parchment, MD   "

## 2024-12-10 NOTE — Progress Notes (Signed)
 "  PROGRESS NOTE  Todd Salazar FMW:[id number] DOB: Oct 23, 1936 DOA: 12/05/2024 PCP: Patient, No Pcp Per  HPI/Recap of past 24 hours: Todd Salazar is a 88 y.o. male with medical history significant of anemia, CAD, A-fib (has been off Eliquis  secondary to GI bleed), colon cancer, HLD, RLS, prostate cancer, restrictive lung disease, bronchiectasis, CKD stage III presenting with persistent left sided chest pain with dyspnea, ongoing for the past couple of weeks, now with nausea and recurrent vomiting for the past couple of days.  Of note, patient has been in and out of hospitals including [facility] as well as current health.  Last admission in 11/30/2024 for pneumonia and associated chest pain/shortness of breath, was discharged 12/21.  Presented again on 12/25 for persistent chest pain with orthopnea and dyspnea as well as decreased appetite with nausea and recurrent vomiting.  In the ED, VS fairly stable with some noted tachycardia. Labs showed fairly stable except for BNP 500-->4,689.0, troponin 30, flat trend.  Cardiology consulted.  Patient admitted for further management.    Today, patient reports generalized weakness, some nausea, denies any vomiting.  Improved diarrhea.  Hemoglobin dropped to 6.7.  GI consulted.    Assessment/Plan: Principal Problem:   Chest pain Active Problems:   CAD (coronary artery disease), native coronary artery   Carcinoma of prostate (HCC)   CAP (community acquired pneumonia)   Macrocytic anemia   Anxiety   Current use of long term anticoagulation   Acute respiratory failure with hypoxia (HCC)   Elevated brain natriuretic peptide (BNP) level   HTN (hypertension)   PAF (paroxysmal atrial fibrillation) (HCC)   CKD-3A   Pacemaker   Atrial tachycardia   C. difficile colitis   Chest pain History of CAD ??  Recurrent pneumonia, ?  Aspiration, ?  Pericarditis Noted restrictive lung disease Unknown etiology, unlikely ACS Afebrile, with  resolved leukocytosis Troponin minimally elevated with flat trend EKG with no acute ST changes Echo done 12/01/2024 showed EF of 60 to 65%, no regional wall motion abnormality, right ventricular systolic function was normal BNP elevated  500-->4,689 LDL 38 Chest x-ray negative for volume overload, questionable persistent pneumonia CRP 5.2, ESR 22 Procalcitonin, lactic acid WNL CT chest/abdomen showed stable small focal airspace consolidation in the posterior right lower lobe, questionable focal wall thickening in the mid ascending colon without acute inflammatory stranding.  Diffuse colonic diverticulosis.  Consider colonoscopy for further evaluation SLP consulted Cardiology consulted, appreciate recs, signed off  C. difficile colitis Intractable nausea/vomiting-improving Reported diarrhea, positive for C. Difficile CT a/p showed questionable focal wall thickening in the mid ascending colon without acute inflammatory stranding.  Diffuse colonic diverticulosis.  Consider colonoscopy for further evaluation once fully recovered.  Also noted bilateral renal cysts, largest in the left kidney measuring 4.1 cm with numerous punctate left renal calculi Continue Dificid  x 10 days IV fluid, antiemetics, monitor closely  AKI on CKD stage IIIa ATN possibly from C. difficile colitis/hypotension/?GI bleed Baseline creatinine around 1.1--> 3.6-->3.55--> 3.01 CT A/P showed bilateral renal cysts, numerous punctate left renal calculi Renal ultrasound with bilateral echogenic kidneys, bilateral simple renal cysts Nephrology consulted, appreciate recs Continue IV fluid Daily BMP  Normocytic anemia/anemia of chronic disease Folate deficiency History of GI bleed-recent EGD with [facility] on 11/11/2024, which showed gastritis Hemoglobin dropping, 7.5--> 6.7 FOBT positive (current C. Difficile) GI consulted, appreciate recs, plan for capsule endoscopy Anemia panel done 11/30/2024 showed iron 43,  ferritin greater than 1000, folate 4, vitamin B12 greater than 1000  Transfuse 2 units of PRBC on 12/30 Continue folic acid  Hold Eliquis  for now, will need to follow-up with cardiology and GI as an outpatient to determine when Eliquis  can be resumed Daily CBC  HTN Currently stable Continue IV hydration Discontinue metoprolol , hold amiodarone  Monitor closely  Persistent A-fib SSS s/p PPM History of atrial tachycardia/PVCs Currently in atrial tachycardia History of ablation Currently rate controlled Cardiology consulted, okay to holding amiodarone , if A-fib with RVR, may restart IV amiodarone   Continue to hold Eliquis  Of note, patient has failed sotalol , flecainide , Tikosyn  and propafenone, has not also tolerated AV nodal blocking agents Telemetry   Carcinoma of prostate In remission, follow-up as an outpatient     Wound 12/06/24 1323 Pressure Injury Perineum Medial Stage 2 -  Partial thickness loss of dermis presenting as a shallow open injury with a red, pink wound bed without slough. (Active)       Estimated body mass index is 20.78 kg/m as calculated from the following:   Height as of this encounter: 6' 3 (1.905 m).   Weight as of this encounter: 75.4 kg.     Code Status: Full  Family Communication: None at bedside  Disposition Plan: Status is: Inpatient Remains inpatient appropriate because: Level of care      Consultants: Cardiology Nephrology ID GI  Procedures: None  Antimicrobials: Dificid   DVT prophylaxis: Hold Eliquis    Objective: Vitals:   12/10/24 0433 12/10/24 0950 12/10/24 1020 12/10/24 1253  BP: (!) 96/55 120/60 (!) 112/55 122/63  Pulse: 67 68 66 76  Resp: 18  20   Temp: 98.9 F (37.2 C) 98.6 F (37 C) 98.7 F (37.1 C) 98 F (36.7 C)  TempSrc:  Oral Oral Oral  SpO2: 94% 98% 97% 99%  Weight:      Height:        Intake/Output Summary (Last 24 hours) at 12/10/2024 1632 Last data filed at 12/10/2024 1253 Gross per 24 hour   Intake 775.36 ml  Output 690 ml  Net 85.36 ml   Filed Weights   12/06/24 0422 12/07/24 0500 12/08/24 0500  Weight: 75.9 kg 75.2 kg 75.4 kg    Exam: General: NAD  Cardiovascular: S1, S2 present Respiratory: CTAB Abdomen: Soft, nontender, nondistended, bowel sounds present Musculoskeletal: No bilateral pedal edema noted Skin: Normal Psychiatry: Normal mood     Data Reviewed: CBC: Recent Labs  Lab 12/06/24 0410 12/07/24 0008 12/08/24 0734 12/09/24 0402 12/10/24 0421 12/10/24 1536  WBC 18.6* 16.2* 9.1 6.1 3.5*  --   HGB 10.2* 9.6* 8.2* 7.5* 6.7* 7.9*  HCT 30.8* 28.9* 25.0* 23.7* 20.7* 24.3*  MCV 96.3 96.3 97.7 99.2 97.6  --   PLT 290 247 183 176 153  --    Basic Metabolic Panel: Recent Labs  Lab 12/05/24 1848 12/06/24 0410 12/06/24 1045 12/07/24 0008 12/08/24 0434 12/09/24 0402 12/10/24 0421  NA  --  140  --  138 135 138 140  K  --  4.9  --  4.8 4.5 4.1 3.8  CL  --  104  --  103 104 109 110  CO2  --  21*  --  22 20* 19* 19*  GLUCOSE  --  102*  --  119* 99 88 94  BUN  --  14  --  24* 33* 36* 34*  CREATININE  --  1.29*  --  2.39* 3.66* 3.55* 3.01*  CALCIUM   --  9.4  --  8.5* 7.9* 7.9* 7.9*  MG 1.6*  --  2.4  --   --   --   --  PHOS 2.7  --   --   --   --   --   --    GFR: Estimated Creatinine Clearance: 18.8 mL/min (A) (by C-G formula based on SCr of 3.01 mg/dL (H)). Liver Function Tests: Recent Labs  Lab 12/05/24 1521 12/06/24 0410  AST 17 15  ALT 12 11  ALKPHOS 84 100  BILITOT 1.6* 1.2  PROT 6.7 7.4  ALBUMIN 4.0 4.3   No results for input(s): LIPASE, AMYLASE in the last 168 hours. No results for input(s): AMMONIA in the last 168 hours. Coagulation Profile: Recent Labs  Lab 12/05/24 1521  INR 1.6*   Cardiac Enzymes: No results for input(s): CKTOTAL, CKMB, CKMBINDEX, TROPONINI in the last 168 hours.  BNP (last 3 results) Recent Labs    12/05/24 1522 12/06/24 0410 12/07/24 0008  PROBNP 4,689.0* 4,621.0* 6,363.0*    HbA1C: No results for input(s): HGBA1C in the last 72 hours. CBG: Recent Labs  Lab 12/06/24 0816 12/07/24 0810 12/08/24 0720 12/09/24 0803 12/10/24 0755  GLUCAP 94 104* 108* 84 104*   Lipid Profile: No results for input(s): CHOL, HDL, LDLCALC, TRIG, CHOLHDL, LDLDIRECT in the last 72 hours.  Thyroid  Function Tests: No results for input(s): TSH, T4TOTAL, FREET4, T3FREE, THYROIDAB in the last 72 hours. Anemia Panel: No results for input(s): VITAMINB12, FOLATE, FERRITIN, TIBC, IRON, RETICCTPCT in the last 72 hours. Urine analysis:    Component Value Date/Time   COLORURINE YELLOW 12/08/2024 1650   APPEARANCEUR HAZY (A) 12/08/2024 1650   LABSPEC 1.018 12/08/2024 1650   PHURINE 5.0 12/08/2024 1650   GLUCOSEU NEGATIVE 12/08/2024 1650   HGBUR NEGATIVE 12/08/2024 1650   BILIRUBINUR NEGATIVE 12/08/2024 1650   KETONESUR NEGATIVE 12/08/2024 1650   PROTEINUR NEGATIVE 12/08/2024 1650   UROBILINOGEN 1.0 12/11/2011 0005   NITRITE NEGATIVE 12/08/2024 1650   LEUKOCYTESUR NEGATIVE 12/08/2024 1650   Sepsis Labs: @LABRCNTIP (procalcitonin:4,lacticidven:4)  ) Recent Results (from the past 240 hours)  Resp panel by RT-PCR (RSV, Flu A&B, Covid) Anterior Nasal Swab     Status: None   Collection Time: 11/30/24  5:10 PM   Specimen: Anterior Nasal Swab  Result Value Ref Range Status   SARS Coronavirus 2 by RT PCR NEGATIVE NEGATIVE Final    Comment: (NOTE) SARS-CoV-2 target nucleic acids are NOT DETECTED.  The SARS-CoV-2 RNA is generally detectable in upper respiratory specimens during the acute phase of infection. The lowest concentration of SARS-CoV-2 viral copies this assay can detect is 138 copies/mL. A negative result does not preclude SARS-Cov-2 infection and should not be used as the sole basis for treatment or other patient management decisions. A negative result may occur with  improper specimen collection/handling, submission of specimen  other than nasopharyngeal swab, presence of viral mutation(s) within the areas targeted by this assay, and inadequate number of viral copies(<138 copies/mL). A negative result must be combined with clinical observations, patient history, and epidemiological information. The expected result is Negative.  Fact Sheet for Patients:  bloggercourse.com  Fact Sheet for Healthcare Providers:  seriousbroker.it  This test is no t yet approved or cleared by the [place]  FDA and  has been authorized for detection and/or diagnosis of SARS-CoV-2 by FDA under an Emergency Use Authorization (EUA). This EUA will remain  in effect (meaning this test can be used) for the duration of the COVID-19 declaration under Section 564(b)(1) of the Act, 21 U.S.C.section 360bbb-3(b)(1), unless the authorization is terminated  or revoked sooner.       Influenza A by PCR NEGATIVE  NEGATIVE Final   Influenza B by PCR NEGATIVE NEGATIVE Final    Comment: (NOTE) The Xpert Xpress SARS-CoV-2/FLU/RSV plus assay is intended as an aid in the diagnosis of influenza from Nasopharyngeal swab specimens and should not be used as a sole basis for treatment. Nasal washings and aspirates are unacceptable for Xpert Xpress SARS-CoV-2/FLU/RSV testing.  Fact Sheet for Patients: bloggercourse.com  Fact Sheet for Healthcare Providers: seriousbroker.it  This test is not yet approved or cleared by the [place]  FDA and has been authorized for detection and/or diagnosis of SARS-CoV-2 by FDA under an Emergency Use Authorization (EUA). This EUA will remain in effect (meaning this test can be used) for the duration of the COVID-19 declaration under Section 564(b)(1) of the Act, 21 U.S.C. section 360bbb-3(b)(1), unless the authorization is terminated or revoked.     Resp Syncytial Virus by PCR NEGATIVE NEGATIVE Final     Comment: (NOTE) Fact Sheet for Patients: bloggercourse.com  Fact Sheet for Healthcare Providers: seriousbroker.it  This test is not yet approved or cleared by the [place]  FDA and has been authorized for detection and/or diagnosis of SARS-CoV-2 by FDA under an Emergency Use Authorization (EUA). This EUA will remain in effect (meaning this test can be used) for the duration of the COVID-19 declaration under Section 564(b)(1) of the Act, 21 U.S.C. section 360bbb-3(b)(1), unless the authorization is terminated or revoked.  Performed at [facility], [address], [place], [place] [address]   Blood Culture (routine x 2)     Status: None   Collection Time: 12/05/24  3:10 PM   Specimen: Right Antecubital; Blood  Result Value Ref Range Status   Specimen Description   Final    RIGHT ANTECUBITAL Performed at [facility], [address] [place], [place] [address]    Special Requests   Final    BOTTLES DRAWN AEROBIC AND ANAEROBIC Blood Culture adequate volume Performed at [facility], [address], [place], [place] [address]    Culture   Final    NO GROWTH 5 DAYS Performed at [facility], [address] [place], [place] [address]    Report Status 12/10/2024 FINAL  Final  Blood Culture (routine x 2)     Status: None   Collection Time: 12/05/24  3:15 PM   Specimen: BLOOD RIGHT FOREARM  Result Value Ref Range Status   Specimen Description   Final    BLOOD RIGHT FOREARM Performed at [facility], [address] [place], [place] [address]    Special Requests   Final    BOTTLES DRAWN AEROBIC ONLY BLOOD RIGHT FOREARM Performed at [facility], [address], [place], [place] [address]    Culture   Final    NO GROWTH 5 DAYS Performed at [facility], [address] [place], [place] [address]    Report Status  12/10/2024 FINAL  Final  MRSA Next Gen by PCR, Nasal     Status: Abnormal   Collection Time: 12/05/24 10:08 PM   Specimen: Nasal Mucosa; Nasal Swab  Result Value Ref Range Status   MRSA by PCR Next Gen DETECTED (A) NOT DETECTED Final    Comment: RESULT CALLED TO, READ BACK BY AND VERIFIED WITH: JONETTA LESCHES, RN [phone] 12/06/24 BY ALONSO CORDS (NOTE) The GeneXpert MRSA Assay (FDA approved for NASAL specimens only), is one component of a comprehensive MRSA colonization surveillance program. It is not intended to diagnose MRSA infection nor  to guide or monitor treatment for MRSA infections. Test performance is not FDA approved in patients less than 24 years old. Performed at [facility], [address], [place], [place] [address]   C Difficile Quick Screen w PCR reflex     Status: Abnormal   Collection Time: 12/07/24 10:58 AM   Specimen: STOOL  Result Value Ref Range Status   C Diff antigen POSITIVE (A) NEGATIVE Final   C Diff toxin POSITIVE (A) NEGATIVE Final   C Diff interpretation Toxin producing C. difficile detected.  Final    Comment: CRITICAL RESULT CALLED TO, READ BACK BY AND VERIFIED WITH: KATHEE KURTZ, RN ON 12/07/2024 AT 1131 BY SL Performed at [facility], [address] [place], [place] [address]   Gastrointestinal Panel by PCR , Stool     Status: None   Collection Time: 12/07/24 10:58 AM   Specimen: STOOL  Result Value Ref Range Status   Campylobacter species NOT DETECTED NOT DETECTED Final   Plesimonas shigelloides NOT DETECTED NOT DETECTED Final   Salmonella species NOT DETECTED NOT DETECTED Final   Yersinia enterocolitica NOT DETECTED NOT DETECTED Final   Vibrio species NOT DETECTED NOT DETECTED Final   Vibrio cholerae NOT DETECTED NOT DETECTED Final   Enteroaggregative E coli (EAEC) NOT DETECTED NOT DETECTED Final   Enteropathogenic E coli (EPEC) NOT DETECTED NOT DETECTED Final   Enterotoxigenic E coli (ETEC) NOT DETECTED NOT  DETECTED Final   Shiga like toxin producing E coli (STEC) NOT DETECTED NOT DETECTED Final   Shigella/Enteroinvasive E coli (EIEC) NOT DETECTED NOT DETECTED Final   Cryptosporidium NOT DETECTED NOT DETECTED Final   Cyclospora cayetanensis NOT DETECTED NOT DETECTED Final   Entamoeba histolytica NOT DETECTED NOT DETECTED Final   Giardia lamblia NOT DETECTED NOT DETECTED Final   Adenovirus F40/41 NOT DETECTED NOT DETECTED Final   Astrovirus NOT DETECTED NOT DETECTED Final   Norovirus GI/GII NOT DETECTED NOT DETECTED Final   Rotavirus A NOT DETECTED NOT DETECTED Final   Sapovirus (I, II, IV, and V) NOT DETECTED NOT DETECTED Final    Comment: Performed at [facility], [address], [place], [place] [address]      Studies: No results found.   Scheduled Meds:  sodium chloride    Intravenous Once   Chlorhexidine  Gluconate Cloth  6 each Topical Daily   feeding supplement  237 mL Oral BID BM   ferrous sulfate   325 mg Oral QODAY   fidaxomicin   200 mg Oral BID   folic acid   1 mg Oral Daily   mupirocin  ointment  1 Application Nasal BID   sodium chloride  flush  3 mL Intravenous Q12H   sodium chloride  flush  3 mL Intravenous Q12H   sucralfate   1 g Oral TID WC & HS    Continuous Infusions:  sodium chloride  100 mL/hr at 12/10/24 9374   famotidine  (PEPCID ) IV 20 mg (12/10/24 0842)   promethazine  (PHENERGAN ) injection (IM or IVPB) Stopped (12/08/24 1032)     LOS: 5 days     Lebron JINNY Cage, MD [facility]  If 7PM-7AM, please contact night-coverage [email or web address] 12/10/2024, 4:32 PM    "

## 2024-12-10 NOTE — Progress Notes (Signed)
 [facility] Progress Note  Name: Stella Encarnacion MRN: [id number] DOB: 1936/03/20  Chief Complaint:  Chest pain  Subjective:  Feels ok today.  He had 1.0 liters UOP over 12/29.  he has been on normal saline at 100 ml/hr.  He got PRBC's today.   States has been told GI may be planning capsule endoscopy - trying to find out where losing blood.   Review of systems:    He denies any shortness of breath or chest pain; was short of breath when he first arrived Denies n/v; was having nausea when first came in Diarrhea still there per pt  ----------- Background on consult:   Mael Delap is an 88 y.o. male with a history of CASHD, h/o pericarditis in 2024, atrial fibrillation, colon cancer, HLD, RLS, prostace cancer, restrictive lung disease, CKDIII recently admitted with chest pain with CTA done at that time showing RLL PNA but no PE + hypoxia (off Eliquis  secondary to GI bleed. Patient was treated with rocephin  azithromycin  and home with oral abx (azithro and cefinir). CTA on 11/30/24. Patient now here with .chest pain found to be tachy and in afib with chest pain described as tightness since Saturday on the left side without radiation and not associated with nausea or vomiting.  BNP 4700 with CXR showing persistent right hilar consolidation and hilar LN treated empirically with abx. Creatinine at discharge was 1.12, and 1.09 at time of re-presentation but has slowly risen each day.  Of note: the son states all this started at Thanksgiving, not feeling well, eventually going to [facility] bec of black tarry stool -> EGD and subsequent neck pain -> evaluation at [facility] in [place]. Nausea also for a few weeks and now here with chest pain again, pleuritic in nature. Remote history of nephrolithiasis h/o ESWL last episode >5 years ago, no NSAID use and no ESRD/ CKD in the family.   Intake/Output Summary (Last 24 hours) at 12/10/2024 2012 Last data filed at 12/10/2024 1912 Gross per 24 hour  Intake 562.5  ml  Output 690 ml  Net -127.5 ml    Vitals:  Vitals:   12/10/24 1020 12/10/24 1253 12/10/24 1651 12/10/24 1722  BP: (!) 112/55 122/63 (!) 130/51 118/65  Pulse: 66 76 70 73  Resp: 20     Temp: 98.7 F (37.1 C) 98 F (36.7 C) 98.6 F (37 C) 98 F (36.7 C)  TempSrc: Oral Oral Oral Oral  SpO2: 97% 99% 97% 97%  Weight:      Height:         Physical Exam:   General elderly male in bed in no acute distress HEENT normocephalic atraumatic extraocular movements intact sclera anicteric Neck supple trachea midline Lungs clear to auscultation bilaterally normal work of breathing at rest on room air  Heart S1S2 no rub Abdomen soft nontender nondistended Extremities no edema  Psych normal mood and affect Neuro alert and oriented x 3 provides hx and follows commands [place] in place  Medications reviewed   Labs:     Latest Ref Rng & Units 12/10/2024    4:21 AM 12/09/2024    4:02 AM 12/08/2024    4:34 AM  BMP  Glucose 70 - 99 mg/dL 94  88  99   BUN 8 - 23 mg/dL 34  36  33   Creatinine 0.61 - 1.24 mg/dL 6.98  6.44  6.33   Sodium 135 - 145 mmol/L 140  138  135   Potassium 3.5 - 5.1 mmol/L  3.8  4.1  4.5   Chloride 98 - 111 mmol/L 110  109  104   CO2 22 - 32 mmol/L 19  19  20    Calcium  8.9 - 10.3 mg/dL 7.9  7.9  7.9      Assessment/Plan:   Assessment/Plan: AKI secondary to hypotension in the setting of infection and ATN.  Note baseline Cr 1.1 - 1.3.  U/S RK10.1 LK 11.5 with diffusely increased echogenicity. Bladder scan with 138 ml and then 67 mL - can stop fluids for now - he has been on them today    - improving with supportive care  -Monitor Daily I/Os, Daily weight  - avoid hypotension  - being treated for severe C dif per primary team    CAP recently treated with course of abx.  Ckd stage 3a baseline Cr 1.1 - 1.3 PAF -h/o ablation with  Eliquis  recently held because of GIB and then patient later restarted; per primary team  HTN - hypotensive here which has now  improved; gentle fluids as tolerated R/o MI with h/o CASHD CDif colitis being treated by the primary team. Normocytic anemia -  PRBC's per primary team.  Had unit today Iron sats 24%.  Low serum folate and on folic acid   GI is working up  Some component may be dilutional   Disposition - disposition per primary team    Katheryn JAYSON Saba, MD 12/10/2024 8:32 PM

## 2024-12-10 NOTE — Consult Note (Signed)
 [facility] Gastroenterology Consult  Referring Provider: No ref. provider found Primary Care Physician:  Patient, No Pcp Per Primary Gastroenterologist: [facility]  Reason for Consultation: Atypical chest pain, anemia, fecal occult positive stool  SUBJECTIVE:   HPI: Todd Salazar is a 88 y.o. male with past medical history significant for paroxysmal atrial fibrillation on Eliquis  (held secondary to GI bleeding), prostate cancer, nephrolithiasis, coronary artery disease.  EGD 11/11/2024 at [facility] for melena and acute blood loss anemia showing mildly erythematous antral mucosa (biopsy showed chronic focally active gastritis negative for H. pylori).  Subsequently had severe neck discomfort and CT imaging 11/12/2024 revealed possible esophageal perforation.  He was transferred to [facility] and subsequent esophagram was negative for contrast leak.  Seen by primary GI through [facility] on 11/26/2024 and further investigation of melena/acute blood loss anemia was recommended with video capsule endoscopy (colonoscopy was also recommended).  Hospitalization for community-acquired pneumonia 11/30/2024 - 12/01/2024 for which he received azithromycin  and Rocephin .  Presented to hospital 12/05/2024 with atypical chest pain.  Cardiology has evaluated and not suspicious for cardiac etiology.  On my evaluation, patient noted that his chest pain is improved with treatment of pneumonia.  His chest pain is worse with palpation of his anterior chest wall.  He has been having diarrhea and is being treated with Dificid  for C. difficile.  He is not agreeable to colonoscopy given his age and as his sister had perforation in the past.  He noted that his stools appear dark in color, no hematochezia.  He is agreeable to video capsule endoscopy.  Past Medical History:  Diagnosis Date   Arthritis    Atrial fibrillation (HCC)    Cancer (HCC)    prostate   Dysrhythmia    afib   History of kidney  stones    Past Surgical History:  Procedure Laterality Date   ATRIAL FIBRILLATION ABLATION N/A 10/29/2021   Procedure: ATRIAL FIBRILLATION ABLATION;  Surgeon: Inocencio Soyla Lunger, MD;  Location: [facility] INVASIVE CV LAB;  Service: Cardiovascular;  Laterality: N/A;   ATRIAL FIBRILLATION ABLATION N/A 06/29/2023   Procedure: ATRIAL FIBRILLATION ABLATION;  Surgeon: Inocencio Soyla Lunger, MD;  Location: [facility] INVASIVE CV LAB;  Service: Cardiovascular;  Laterality: N/A;   Bone spur     CARDIOVERSION N/A 06/01/2022   Procedure: CARDIOVERSION;  Surgeon: Kate Lonni CROME, MD;  Location: [facility] ENDOSCOPY;  Service: Cardiovascular;  Laterality: N/A;   CHOLECYSTECTOMY     HERNIA REPAIR     JOINT REPLACEMENT     kidney stones     LAMINECTOMY WITH POSTERIOR LATERAL ARTHRODESIS LEVEL 1 Right 11/25/2019   Procedure: Laminectomy and Foraminotomy  Lumbar Four-Lumbar Five - right, with instrumented fusion;  Surgeon: Joshua Alm RAMAN, MD;  Location: [facility] OR;  Service: Neurosurgery;  Laterality: Right;  Laminectomy and Foraminotomy  Lumbar Four-Lumbar Five - right, with instrumented fusion   PROSTATE SURGERY     TEE WITHOUT CARDIOVERSION N/A 06/01/2022   Procedure: TRANSESOPHAGEAL ECHOCARDIOGRAM (TEE);  Surgeon: Kate Lonni CROME, MD;  Location: [facility] ENDOSCOPY;  Service: Cardiovascular;  Laterality: N/A;   Prior to Admission medications  Medication Sig Start Date End Date Taking? Authorizing Provider  cefdinir  (OMNICEF ) 300 MG capsule Take 1 capsule (300 mg total) by mouth 2 (two) times daily. 12/01/24  Yes Rojelio Nest, DO  [Paused] ELIQUIS  5 MG TABS tablet TAKE 1 TABLET BY MOUTH 2 TIMES A DAY Wait to take this until your doctor or other care provider tells you to start again.  10/14/24  Yes Camnitz, Will Gladis, MD  ferrous sulfate  325 (65 FE) MG tablet Take 325 mg by mouth every other day.   Yes [provider]  folic acid  (FOLVITE ) 1 MG tablet Take 1 tablet (1 mg total) by mouth daily. 12/01/24 12/31/24 Yes  Rojelio Nest, DO  nitroGLYCERIN  (NITROSTAT ) 0.4 MG SL tablet Place 1 tablet (0.4 mg total) under the tongue every 5 (five) minutes as needed for chest pain. 09/18/24  Yes Leverne Charlies Helling, PA-C  metoprolol  tartrate (LOPRESSOR ) 25 MG tablet Take 1 tablet (25 mg total) by mouth daily as needed (PALPITATIONS LSTING LONGER THATN 30 MINUTES). 09/18/24   Leverne Charlies Helling, PA-C   Current Facility-Administered Medications  Medication Dose Route Frequency Provider Last Rate Last Admin   0.9 %  sodium chloride  infusion   Intravenous Continuous Ezenduka, Nkeiruka J, MD 100 mL/hr at 12/10/24 0625 New Bag at 12/10/24 9374   acetaminophen  (TYLENOL ) tablet 650 mg  650 mg Oral Q6H PRN Willette Adriana LABOR, MD   650 mg at 12/07/24 1045   Or   acetaminophen  (TYLENOL ) suppository 650 mg  650 mg Rectal Q6H PRN Shahmehdi, Adriana LABOR, MD       bisacodyl  (DULCOLAX) EC tablet 5 mg  5 mg Oral Daily PRN Shahmehdi, Adriana LABOR, MD       Chlorhexidine  Gluconate Cloth 2 % PADS 6 each  6 each Topical Daily Shahmehdi, Seyed A, MD   6 each at 12/06/24 2318   famotidine  (PEPCID ) IVPB 20 mg premix  20 mg Intravenous Daily Ezenduka, Nkeiruka J, MD 100 mL/hr at 12/10/24 0842 20 mg at 12/10/24 9157   feeding supplement (ENSURE PLUS HIGH PROTEIN) liquid 237 mL  237 mL Oral BID BM Ezenduka, Nkeiruka J, MD       ferrous sulfate  tablet 325 mg  325 mg Oral QODAY Shahmehdi, Adriana A, MD   325 mg at 12/10/24 9085   fidaxomicin  (DIFICID ) tablet 200 mg  200 mg Oral BID Ezenduka, Nkeiruka J, MD   200 mg at 12/10/24 9085   folic acid  (FOLVITE ) tablet 1 mg  1 mg Oral Daily Shahmehdi, Seyed A, MD   1 mg at 12/10/24 9085   hydrALAZINE  (APRESOLINE ) injection 10 mg  10 mg Intravenous Q4H PRN Shahmehdi, Seyed A, MD       ipratropium (ATROVENT ) nebulizer solution 0.5 mg  0.5 mg Nebulization Q6H PRN Shahmehdi, Seyed A, MD       mupirocin  ointment (BACTROBAN ) 2 % 1 Application  1 Application Nasal BID Willette Adriana LABOR, MD   1 Application at 12/10/24 9085    nitroGLYCERIN  (NITROSTAT ) SL tablet 0.4 mg  0.4 mg Sublingual Q5 min PRN Willette Adriana A, MD   0.4 mg at 12/07/24 2104   ondansetron  (ZOFRAN ) tablet 4 mg  4 mg Oral Q6H PRN Willette Adriana LABOR, MD       Or   ondansetron  (ZOFRAN ) injection 4 mg  4 mg Intravenous Q6H PRN Willette Adriana A, MD   4 mg at 12/10/24 9090   Oral care mouth rinse  15 mL Mouth Rinse PRN Ezenduka, Nkeiruka J, MD       oxyCODONE  (Oxy IR/ROXICODONE ) immediate release tablet 5 mg  5 mg Oral Q4H PRN Shahmehdi, Adriana A, MD   5 mg at 12/08/24 0031   promethazine  (PHENERGAN ) 12.5 mg in sodium chloride  0.9 % 50 mL IVPB  12.5 mg Intravenous Q8H PRN Ezenduka, Nkeiruka J, MD   Stopped at 12/08/24 1032   senna-docusate (Senokot-S) tablet 1  tablet  1 tablet Oral QHS PRN Shahmehdi, Seyed A, MD       sodium chloride  flush (NS) 0.9 % injection 3 mL  3 mL Intravenous Q12H Shahmehdi, Seyed A, MD   3 mL at 12/09/24 2127   sodium chloride  flush (NS) 0.9 % injection 3 mL  3 mL Intravenous Q12H Shahmehdi, Seyed A, MD   3 mL at 12/10/24 0954   sucralfate  (CARAFATE ) 1 GM/10ML suspension 1 g  1 g Oral TID WC & HS Ezenduka, Nkeiruka J, MD   1 g at 12/08/24 2126   traZODone  (DESYREL ) tablet 25 mg  25 mg Oral QHS PRN Willette Jest A, MD   25 mg at 12/09/24 2126   Allergies as of 12/05/2024 - Review Complete 12/05/2024  Allergen Reaction Noted   Ciprofloxacin Anaphylaxis, Hives, and Other (See Comments) 12/10/2011   Tramadol Swelling and Other (See Comments) 12/10/2011   Duloxetine Other (See Comments) 08/09/2019   Gabapentin  Other (See Comments) 10/23/2019   Hydrocodone-acetaminophen  Nausea And Vomiting 08/09/2019   Nisoldipine Other (See Comments) 08/01/2019   Benadryl [diphenhydramine] Anxiety and Other (See Comments) 08/23/2021   Methocarbamol Nausea Only 10/23/2019   Ondansetron  hcl Nausea Only 10/23/2019   Sulfa antibiotics Rash 12/10/2011   Family History  Problem Relation Age of Onset   Hypertension Father    CAD Neg Hx    Heart  failure Neg Hx    Stroke Neg Hx    Social History   Socioeconomic History   Marital status: Widowed    Spouse name: Not on file   Number of children: Not on file   Years of education: Not on file   Highest education level: Not on file  Occupational History   Not on file  Tobacco Use   Smoking status: Former    Current packs/day: 0.00    Types: Cigarettes    Quit date: 12/10/1996    Years since quitting: 28.0   Smokeless tobacco: Never   Tobacco comments:    Former smoker (08/27/2021)  Vaping Use   Vaping status: Never Used  Substance and Sexual Activity   Alcohol use: No   Drug use: No   Sexual activity: Never  Other Topics Concern   Not on file  Social History Narrative   Not on file   Social Drivers of Health   Tobacco Use: Medium Risk (12/05/2024)   Patient History    Smoking Tobacco Use: Former    Smokeless Tobacco Use: Never    Passive Exposure: Not on Actuary Strain: Not on file  Food Insecurity: No Food Insecurity (12/05/2024)   Epic    Worried About Programme Researcher, Broadcasting/film/video in the Last Year: Never true    Ran Out of Food in the Last Year: Never true  Transportation Needs: No Transportation Needs (12/05/2024)   Epic    Lack of Transportation (Medical): No    Lack of Transportation (Non-Medical): No  Physical Activity: Not on file  Stress: Not on file  Social Connections: Moderately Integrated (12/05/2024)   Social Connection and Isolation Panel    Frequency of Communication with Friends and Family: More than three times a week    Frequency of Social Gatherings with Friends and Family: More than three times a week    Attends Religious Services: More than 4 times per year    Active Member of [employer] or Organizations: Yes    Attends Banker Meetings: More than 4 times per year    Marital  Status: Widowed  Intimate Partner Violence: Not At Risk (12/05/2024)   Epic    Fear of Current or Ex-Partner: No    Emotionally Abused: No     Physically Abused: No    Sexually Abused: No  Depression (PHQ2-9): Not on file  Alcohol Screen: Not on file  Housing: Low Risk (12/05/2024)   Epic    Unable to Pay for Housing in the Last Year: No    Number of Times Moved in the Last Year: 0    Homeless in the Last Year: No  Utilities: Not At Risk (12/05/2024)   Epic    Threatened with loss of utilities: No  Health Literacy: Not on file   Review of Systems:  Review of Systems  Respiratory:  Negative for shortness of breath.   Cardiovascular:  Positive for chest pain.  Gastrointestinal:  Positive for abdominal pain, diarrhea and melena. Negative for nausea and vomiting.    OBJECTIVE:   Temp:  [98 F (36.7 C)-99.1 F (37.3 C)] 98 F (36.7 C) (12/30 1253) Pulse Rate:  [66-80] 76 (12/30 1253) Resp:  [16-20] 20 (12/30 1020) BP: (96-129)/(53-63) 122/63 (12/30 1253) SpO2:  [93 %-99 %] 99 % (12/30 1253) Last BM Date : 12/09/24 Physical Exam Constitutional:      General: He is not in acute distress.    Appearance: He is not ill-appearing, toxic-appearing or diaphoretic.  Cardiovascular:     Comments: Regular pulse Pulmonary:     Effort: No respiratory distress.  Abdominal:     General: There is no distension.     Palpations: Abdomen is soft.     Tenderness: There is no abdominal tenderness.  Skin:    General: Skin is warm and dry.     Coloration: Skin is pale.  Neurological:     Mental Status: He is alert.     Labs: Recent Labs    12/08/24 0734 12/09/24 0402 12/10/24 0421  WBC 9.1 6.1 3.5*  HGB 8.2* 7.5* 6.7*  HCT 25.0* 23.7* 20.7*  PLT 183 176 153   BMET Recent Labs    12/08/24 0434 12/09/24 0402 12/10/24 0421  NA 135 138 140  K 4.5 4.1 3.8  CL 104 109 110  CO2 20* 19* 19*  GLUCOSE 99 88 94  BUN 33* 36* 34*  CREATININE 3.66* 3.55* 3.01*  CALCIUM  7.9* 7.9* 7.9*   LFT No results for input(s): PROT, ALBUMIN, AST, ALT, ALKPHOS, BILITOT, BILIDIR, IBILI in the last 72 hours. PT/INR No  results for input(s): LABPROT, INR in the last 72 hours.  Diagnostic imaging: No results found.  IMPRESSION: Melena Acute blood loss anemia Paroxysmal atrial fibrillation, Eliquis  on hold C. difficile on Dificid  Atypical chest pain (question musculoskeletal in origin versus related to community-acquired pneumonia) Concern for esophageal perforation after EGD 11/11/2024, esophagram negative for contrast leak  PLAN: - Recommend video capsule endoscopy to further evaluate melena and acute blood loss anemia - Trend H/H, transfuse for hemoglobin less than 7 - Antibiotic therapy for C. Difficile - Monitor bowel movements - Further recommendations to follow pending VCE   LOS: 5 days   Estefana Keas, [facility] Gastroenterology

## 2024-12-11 ENCOUNTER — Encounter (HOSPITAL_COMMUNITY)
Admission: EM | Disposition: A | Discharge status: Home Health | Payer: Self-pay | Source: Home / Self Care | Attending: Internal Medicine

## 2024-12-11 DIAGNOSIS — I480 Paroxysmal atrial fibrillation: Secondary | ICD-10-CM | POA: Diagnosis not present

## 2024-12-11 DIAGNOSIS — I2510 Atherosclerotic heart disease of native coronary artery without angina pectoris: Secondary | ICD-10-CM | POA: Diagnosis not present

## 2024-12-11 DIAGNOSIS — Z950 Presence of cardiac pacemaker: Secondary | ICD-10-CM | POA: Diagnosis not present

## 2024-12-11 DIAGNOSIS — D539 Nutritional anemia, unspecified: Secondary | ICD-10-CM | POA: Diagnosis not present

## 2024-12-11 DIAGNOSIS — I10 Essential (primary) hypertension: Secondary | ICD-10-CM | POA: Diagnosis not present

## 2024-12-11 DIAGNOSIS — C61 Malignant neoplasm of prostate: Secondary | ICD-10-CM | POA: Diagnosis not present

## 2024-12-11 DIAGNOSIS — A0472 Enterocolitis due to Clostridium difficile, not specified as recurrent: Secondary | ICD-10-CM | POA: Diagnosis not present

## 2024-12-11 DIAGNOSIS — R0782 Intercostal pain: Secondary | ICD-10-CM | POA: Diagnosis not present

## 2024-12-11 DIAGNOSIS — I4719 Other supraventricular tachycardia: Secondary | ICD-10-CM | POA: Diagnosis not present

## 2024-12-11 DIAGNOSIS — N1831 Chronic kidney disease, stage 3a: Secondary | ICD-10-CM | POA: Diagnosis not present

## 2024-12-11 DIAGNOSIS — J189 Pneumonia, unspecified organism: Secondary | ICD-10-CM | POA: Diagnosis not present

## 2024-12-11 HISTORY — PX: GIVENS CAPSULE STUDY: SHX5432

## 2024-12-11 LAB — CBC
HCT: 28.6 % — ABNORMAL LOW (ref 39.0–52.0)
Hemoglobin: 9.6 g/dL — ABNORMAL LOW (ref 13.0–17.0)
MCH: 31.5 pg (ref 26.0–34.0)
MCHC: 33.6 g/dL (ref 30.0–36.0)
MCV: 93.8 fL (ref 80.0–100.0)
Platelets: 156 K/uL (ref 150–400)
RBC: 3.05 MIL/uL — ABNORMAL LOW (ref 4.22–5.81)
RDW: 24.4 % — ABNORMAL HIGH (ref 11.5–15.5)
WBC: 3.3 K/uL — ABNORMAL LOW (ref 4.0–10.5)
nRBC: 0 % (ref 0.0–0.2)

## 2024-12-11 LAB — BASIC METABOLIC PANEL WITH GFR
Anion gap: 9 (ref 5–15)
BUN: 28 mg/dL — ABNORMAL HIGH (ref 8–23)
CO2: 19 mmol/L — ABNORMAL LOW (ref 22–32)
Calcium: 8.2 mg/dL — ABNORMAL LOW (ref 8.9–10.3)
Chloride: 113 mmol/L — ABNORMAL HIGH (ref 98–111)
Creatinine, Ser: 2.31 mg/dL — ABNORMAL HIGH (ref 0.61–1.24)
GFR, Estimated: 27 mL/min — ABNORMAL LOW
Glucose, Bld: 89 mg/dL (ref 70–99)
Potassium: 4 mmol/L (ref 3.5–5.1)
Sodium: 140 mmol/L (ref 135–145)

## 2024-12-11 LAB — BPAM RBC
Blood Product Expiration Date: 202601242359
Blood Product Expiration Date: 202601252359
ISSUE DATE / TIME: 202512300959
ISSUE DATE / TIME: 202512301702
Unit Type and Rh: 6200
Unit Type and Rh: 6200

## 2024-12-11 LAB — TYPE AND SCREEN
ABO/RH(D): A POS
Antibody Screen: NEGATIVE
Unit division: 0
Unit division: 0

## 2024-12-11 LAB — GLUCOSE, CAPILLARY: Glucose-Capillary: 91 mg/dL (ref 70–99)

## 2024-12-11 SURGERY — IMAGING PROCEDURE, GI TRACT, INTRALUMINAL, VIA CAPSULE
Anesthesia: LOCAL

## 2024-12-11 SURGICAL SUPPLY — 1 items: TOWEL COTTON PACK 4EA (MISCELLANEOUS) ×2 IMPLANT

## 2024-12-11 NOTE — Progress Notes (Signed)
 " PROGRESS NOTE    Todd Salazar  FMW:[id number] DOB: 10/13/1936 DOA: 12/05/2024 PCP: Patient, No Pcp Per    Chief Complaint  Patient presents with   Chest Pain    Brief Narrative:  Todd Salazar is a 88 y.o. male with medical history significant of anemia, CAD, A-fib (has been off Eliquis  secondary to GI bleed), colon cancer, HLD, RLS, prostate cancer, restrictive lung disease, bronchiectasis, CKD stage III presenting with persistent left sided chest pain with dyspnea, ongoing for the past couple of weeks, now with nausea and recurrent vomiting for the past couple of days.  Of note, patient has been in and out of hospitals including [facility] as well as current health.  Last admission in 11/30/2024 for pneumonia and associated chest pain/shortness of breath, was discharged 12/21.  Presented again on 12/25 for persistent chest pain with orthopnea and dyspnea as well as decreased appetite with nausea and recurrent vomiting.  In the ED, VS fairly stable with some noted tachycardia. Labs showed fairly stable except for BNP 500-->4,689.0, troponin 30, flat trend.  Cardiology consulted.  Patient admitted for further management.     Assessment & Plan:   Principal Problem:   Chest pain Active Problems:   CAD (coronary artery disease), native coronary artery   Carcinoma of prostate (HCC)   CAP (community acquired pneumonia)   Macrocytic anemia   Anxiety   Current use of long term anticoagulation   Acute respiratory failure with hypoxia (HCC)   Elevated brain natriuretic peptide (BNP) level   HTN (hypertension)   PAF (paroxysmal atrial fibrillation) (HCC)   CKD-3A   Pacemaker   Atrial tachycardia   C. difficile colitis  #1 chest pain/history of CAD/??  Recurrent pneumonia/?/Pericarditis/noted restrictive lung disease -Patient with complaints of chest pain on presentation unknown etiology unlikely felt to be ACS. - Patient afebrile with resolution of leukocytosis. -  Troponins minimally elevated but flattened. - EKG with no ischemic changes noted. - 2D echo done 12/01/2024 with EF of 60 to 65%, no regional wall motion abnormalities, right ventricular systolic function normal. - BNP noted to be elevated 500>>> 4689. - LDL noted at 38. - Chest x-ray negative for volume overload with concern for persistent pneumonia. - CRP 5.2, ESR 22. - Procalcitonin, lactic acid within normal limits. - CT chest abdomen and pelvis showed stable small focal airspace consolidation in the posterior right lower lobe, questionable focal wall thickening in the mid ascending colon without acute inflammatory stranding.  Diffuse colonic diverticulosis. - Patient seen in consultation by cardiology who have signed off.  2.  C. difficile colitis/intractable nausea and vomiting-improving -Patient noted with complaints of diarrhea, stool studies positive for C. difficile. - CT abdomen and pelvis showed questionable focal wall thickening in the mid descending colon without acute inflammatory stranding.  Diffuse colonic diverticulosis.  Consider colonoscopy for further evaluation once fully recovered.  Also noted bilateral renal cysts, largest in the left kidney measuring 4.1 cm with numerous punctuate left renal calculi. - Slowly improving clinically. - Continue Dificid  x 10 days. - IV fluids, antiemetics, supportive care.  3.  AKI on CKD stage III/ATN possibly from C. difficile colitis/hypotension/?  GI bleed - Baseline creatinine approximately 1.1-1.3. - Creatinine noted to have gone up as high as 3.55. - CT abdomen and pelvis showed bilateral renal cysts, numerous punctuate left renal calculi. - Renal ultrasound with bilateral echogenic kidneys, bilateral simple renal cysts. - Patient hydrated gently with IV fluids with renal function improving with creatinine currently  down to 2.31.. - Patient seen in consultation by nephrology.  4.  Normocytic anemia/anemia of chronic  disease/folate deficiency/history of GI bleed-recent EGD with [facility] on 11/11/2024 showed gastritis - Patient noted to have a hemoglobin drop from 7.5-6.7. - FOBT positive. - GI consulted and recommended capsule endoscopy. - Anemia panel done 11/30/2024 with iron of 43, ferritin greater than 8000, folate of 4, vitamin B12 greater than 1000. - Status post transfusion 2 units PRBC 12/10/2024. - Continue folic acid . - Eliquis  on hold will need outpatient follow-up with cardiology and GI to determine whether Eliquis  may be resumed. - Patient undergoing VCE today. - GI following and appreciate input and recommendations.  5.  Hypertension -Stable. - Metoprolol  discontinued, amiodarone  on hold.  6.  Persistent A-fib/SSS status post PPM/history of atrial tachycardia/PVCs/atrial tachycardia/history of ablation -Patient currently rate controlled. - Cardiology was following okay with holding amiodarone , if A-fib with RVR occur again may restart IV amiodarone . - Continue to hold Eliquis  due to concern for GI bleed. - Patient noted to have failed sotalol , flecainide , Tikosyn , propafenone and also not tolerated AV nodal blocking agents. - Outpatient follow-up with cardiology.  7.  Carcinoma of the prostate -In remission. - Outpatient follow-up.  8.  Pressure injury perineum medial stage II, POA Wound 12/06/24 1323 Pressure Injury Perineum Medial Stage 2 -  Partial thickness loss of dermis presenting as a shallow open injury with a red, pink wound bed without slough. (Active)       DVT prophylaxis: SCDs Code Status: Full Family Communication: Updated patient.  No family at bedside. Disposition: TBD  Status is: Inpatient Remains inpatient appropriate because: Severity of illness   Consultants:  Gastroenterology: Dr. Kriss 12/10/2024 ID: Dr. Overton 12/08/2024 Nephrology: Dr. Melia 12/08/2024 Cardiology: Dr. Delford 12/06/2024  Procedures:  Abdominal films from 2023 Chest x-ray  12/05/2024 Renal ultrasound 12/08/2024 CT chest abdomen pelvis 12/06/2024  Antimicrobials:  Anti-infectives (From admission, onward)    Start     Dose/Rate Route Frequency Ordered Stop   12/09/24 1800  cefTRIAXone  (ROCEPHIN ) 2 g in sodium chloride  0.9 % 100 mL IVPB  Status:  Discontinued        2 g 200 mL/hr over 30 Minutes Intravenous Every 24 hours 12/09/24 0759 12/09/24 0805   12/09/24 1000  azithromycin  (ZITHROMAX ) tablet 500 mg  Status:  Discontinued        500 mg Oral Daily 12/09/24 0759 12/09/24 0805   12/07/24 1300  fidaxomicin  (DIFICID ) tablet 200 mg        200 mg Oral 2 times daily 12/07/24 1207 12/17/24 0959   12/06/24 1500  cefTRIAXone  (ROCEPHIN ) 2 g in sodium chloride  0.9 % 100 mL IVPB  Status:  Discontinued        2 g 200 mL/hr over 30 Minutes Intravenous Every 24 hours 12/05/24 1701 12/08/24 1842   12/06/24 1000  azithromycin  (ZITHROMAX ) tablet 500 mg  Status:  Discontinued        500 mg Oral Daily 12/05/24 1654 12/08/24 1842   12/05/24 2000  cefUROXime  (CEFTIN ) tablet 500 mg  Status:  Discontinued        500 mg Oral 2 times daily with meals 12/05/24 1654 12/05/24 1701   12/05/24 1445  cefTRIAXone  (ROCEPHIN ) 2 g in sodium chloride  0.9 % 100 mL IVPB        2 g 200 mL/hr over 30 Minutes Intravenous Once 12/05/24 1436 12/05/24 1530   12/05/24 1445  azithromycin  (ZITHROMAX ) 500 mg in sodium chloride  0.9 % 250  mL IVPB        500 mg 250 mL/hr over 60 Minutes Intravenous  Once 12/05/24 1436 12/05/24 1641         Subjective: Patient lying in bed.  Patient denies any chest pain or shortness of breath.  Denies any abdominal pain.  Feels frequency of stools is decreasing.  Patient feels consistency of stools is improving.  Per RN patient with 1 bowel movement today.  Patient noted to have undergone video capsule endoscopy today.  Objective: Vitals:   12/11/24 0500 12/11/24 0644 12/11/24 1333 12/11/24 2039  BP:  121/69 118/62 134/66  Pulse:  68 68 73  Resp:  18 20 18    Temp:  98.8 F (37.1 C) 98.8 F (37.1 C) 99 F (37.2 C)  TempSrc:  Oral Oral Oral  SpO2:  95% 97% 96%  Weight: 83.1 kg     Height:        Intake/Output Summary (Last 24 hours) at 12/11/2024 2113 Last data filed at 12/11/2024 2051 Gross per 24 hour  Intake --  Output 820 ml  Net -820 ml   Filed Weights   12/07/24 0500 12/08/24 0500 12/11/24 0500  Weight: 75.2 kg 75.4 kg 83.1 kg    Examination:  General exam: Appears calm and comfortable  Respiratory system: Clear to auscultation. Respiratory effort normal. Cardiovascular system: S1 & S2 heard, RRR. No JVD, murmurs, rubs, gallops or clicks. No pedal edema. Gastrointestinal system: Abdomen is nondistended, soft and nontender. No organomegaly or masses felt. Normal bowel sounds heard. Central nervous system: Alert and oriented. No focal neurological deficits. Extremities: Symmetric 5 x 5 power. Skin: No rashes, lesions or ulcers Psychiatry: Judgement and insight appear normal. Mood & affect appropriate.     Data Reviewed: I have personally reviewed following labs and imaging studies  CBC: Recent Labs  Lab 12/07/24 0008 12/08/24 0734 12/09/24 0402 12/10/24 0421 12/10/24 1536 12/10/24 2257 12/11/24 0845  WBC 16.2* 9.1 6.1 3.5*  --   --  3.3*  HGB 9.6* 8.2* 7.5* 6.7* 7.9* 8.7* 9.6*  HCT 28.9* 25.0* 23.7* 20.7* 24.3* 26.4* 28.6*  MCV 96.3 97.7 99.2 97.6  --   --  93.8  PLT 247 183 176 153  --   --  156    Basic Metabolic Panel: Recent Labs  Lab 12/05/24 1848 12/06/24 0410 12/06/24 1045 12/07/24 0008 12/08/24 0434 12/09/24 0402 12/10/24 0421 12/11/24 0845  NA  --    < >  --  138 135 138 140 140  K  --    < >  --  4.8 4.5 4.1 3.8 4.0  CL  --    < >  --  103 104 109 110 113*  CO2  --    < >  --  22 20* 19* 19* 19*  GLUCOSE  --    < >  --  119* 99 88 94 89  BUN  --    < >  --  24* 33* 36* 34* 28*  CREATININE  --    < >  --  2.39* 3.66* 3.55* 3.01* 2.31*  CALCIUM   --    < >  --  8.5* 7.9* 7.9* 7.9* 8.2*   MG 1.6*  --  2.4  --   --   --   --   --   PHOS 2.7  --   --   --   --   --   --   --    < > =  values in this interval not displayed.    GFR: Estimated Creatinine Clearance: 27 mL/min (A) (by C-G formula based on SCr of 2.31 mg/dL (H)).  Liver Function Tests: Recent Labs  Lab 12/05/24 1521 12/06/24 0410  AST 17 15  ALT 12 11  ALKPHOS 84 100  BILITOT 1.6* 1.2  PROT 6.7 7.4  ALBUMIN 4.0 4.3    CBG: Recent Labs  Lab 12/07/24 0810 12/08/24 0720 12/09/24 0803 12/10/24 0755 12/11/24 0758  GLUCAP 104* 108* 84 104* 91     Recent Results (from the past 240 hours)  Blood Culture (routine x 2)     Status: None   Collection Time: 12/05/24  3:10 PM   Specimen: Right Antecubital; Blood  Result Value Ref Range Status   Specimen Description   Final    RIGHT ANTECUBITAL Performed at [facility], [address] [place], [place] [address]    Special Requests   Final    BOTTLES DRAWN AEROBIC AND ANAEROBIC Blood Culture adequate volume Performed at [facility], [address] [place], [place] [address]    Culture   Final    NO GROWTH 5 DAYS Performed at [facility], [address] [place], [place] [address]    Report Status 12/10/2024 FINAL  Final  Blood Culture (routine x 2)     Status: None   Collection Time: 12/05/24  3:15 PM   Specimen: BLOOD RIGHT FOREARM  Result Value Ref Range Status   Specimen Description   Final    BLOOD RIGHT FOREARM Performed at [facility], [address], [place], [place] [address]    Special Requests   Final    BOTTLES DRAWN AEROBIC ONLY BLOOD RIGHT FOREARM Performed at [facility], [address], [place], [place] [address]    Culture   Final    NO GROWTH 5 DAYS Performed at [facility], [address] [place], [place] [address]    Report Status 12/10/2024 FINAL  Final  MRSA Next Gen by PCR, Nasal     Status: Abnormal   Collection Time:  12/05/24 10:08 PM   Specimen: Nasal Mucosa; Nasal Swab  Result Value Ref Range Status   MRSA by PCR Next Gen DETECTED (A) NOT DETECTED Final    Comment: RESULT CALLED TO, READ BACK BY AND VERIFIED WITH: JONETTA LESCHES, RN [phone] 12/06/24 BY ALONSO CORDS (NOTE) The GeneXpert MRSA Assay (FDA approved for NASAL specimens only), is one component of a comprehensive MRSA colonization surveillance program. It is not intended to diagnose MRSA infection nor to guide or monitor treatment for MRSA infections. Test performance is not FDA approved in patients less than 74 years old. Performed at [facility], [address] [place], [place] [address]   C Difficile Quick Screen w PCR reflex     Status: Abnormal   Collection Time: 12/07/24 10:58 AM   Specimen: STOOL  Result Value Ref Range Status   C Diff antigen POSITIVE (A) NEGATIVE Final   C Diff toxin POSITIVE (A) NEGATIVE Final   C Diff interpretation Toxin producing C. difficile detected.  Final    Comment: CRITICAL RESULT CALLED TO, READ BACK BY AND VERIFIED WITH: KATHEE KURTZ, RN ON 12/07/2024 AT 1131 BY SL Performed at [facility], [address], [place], [place] [address]   Gastrointestinal Panel by PCR , Stool     Status: None   Collection Time: 12/07/24 10:58 AM  Specimen: STOOL  Result Value Ref Range Status   Campylobacter species NOT DETECTED NOT DETECTED Final   Plesimonas shigelloides NOT DETECTED NOT DETECTED Final   Salmonella species NOT DETECTED NOT DETECTED Final   Yersinia enterocolitica NOT DETECTED NOT DETECTED Final   Vibrio species NOT DETECTED NOT DETECTED Final   Vibrio cholerae NOT DETECTED NOT DETECTED Final   Enteroaggregative E coli (EAEC) NOT DETECTED NOT DETECTED Final   Enteropathogenic E coli (EPEC) NOT DETECTED NOT DETECTED Final   Enterotoxigenic E coli (ETEC) NOT DETECTED NOT DETECTED Final   Shiga like toxin producing E coli (STEC) NOT DETECTED NOT DETECTED Final    Shigella/Enteroinvasive E coli (EIEC) NOT DETECTED NOT DETECTED Final   Cryptosporidium NOT DETECTED NOT DETECTED Final   Cyclospora cayetanensis NOT DETECTED NOT DETECTED Final   Entamoeba histolytica NOT DETECTED NOT DETECTED Final   Giardia lamblia NOT DETECTED NOT DETECTED Final   Adenovirus F40/41 NOT DETECTED NOT DETECTED Final   Astrovirus NOT DETECTED NOT DETECTED Final   Norovirus GI/GII NOT DETECTED NOT DETECTED Final   Rotavirus A NOT DETECTED NOT DETECTED Final   Sapovirus (I, II, IV, and V) NOT DETECTED NOT DETECTED Final    Comment: Performed at [facility], [address] [place], [place] [address]         Radiology Studies: No results found.      Scheduled Meds:  feeding supplement  237 mL Oral BID BM   ferrous sulfate   325 mg Oral QODAY   fidaxomicin   200 mg Oral BID   folic acid   1 mg Oral Daily   sodium chloride  flush  3 mL Intravenous Q12H   sodium chloride  flush  3 mL Intravenous Q12H   sucralfate   1 g Oral TID WC & HS   Continuous Infusions:  famotidine  (PEPCID ) IV 20 mg (12/11/24 1015)   promethazine  (PHENERGAN ) injection (IM or IVPB) Stopped (12/08/24 1032)     LOS: 6 days    Time spent: 40 minutes    Toribio Hummer, MD [facility]   To contact the attending provider between 7A-7P or the covering provider during after hours 7P-7A, please log into the web site [email or web address] and access using universal [place] password for that web site. If you do not have the password, please call the hospital operator.  12/11/2024, 9:13 PM    "

## 2024-12-11 NOTE — Progress Notes (Signed)
 [facility] capsule endoscopy ordered by MD Kriss.  Patient ingested capsule at 0800 .  Per Given's capsule instructions, patient to remain NPO until 1000 at which time they may progress to clear liquid diet. At 1200 patient may have a small snack such as a half a sandwich or a bowl of soup. At 1600 patient may progress to previously ordered diet.  The capsule endoscopy study will conclude at 2000 at which time the recorder and leads or belt can be removed and placed in a patient belongings bag. Endoscopy staff will pick up the equipment in the AM.  Instructions provided to patient and inpatient RN. Patient and RN demonstrated understanding.

## 2024-12-11 NOTE — Progress Notes (Signed)
 [facility] Progress Note  Name: Todd Salazar MRN: [id number] DOB: Mar 26, 1936  Chief Complaint:  Chest pain  Subjective:  He had 400 ml UOP over 12/30 charted.  GI is performing a capsule endoscopy - trying to find out where losing blood.  He states the study is now complete.    Review of systems:      He denies any shortness of breath or chest pain Denies n/v Diarrhea is better   ----------- Background on consult:   Todd Salazar is an 88 y.o. male with a history of CASHD, h/o pericarditis in 2024, atrial fibrillation, colon cancer, HLD, RLS, prostace cancer, restrictive lung disease, CKDIII recently admitted with chest pain with CTA done at that time showing RLL PNA but no PE + hypoxia (off Eliquis  secondary to GI bleed. Patient was treated with rocephin  azithromycin  and home with oral abx (azithro and cefinir). CTA on 11/30/24. Patient now here with .chest pain found to be tachy and in afib with chest pain described as tightness since Saturday on the left side without radiation and not associated with nausea or vomiting.  BNP 4700 with CXR showing persistent right hilar consolidation and hilar LN treated empirically with abx. Creatinine at discharge was 1.12, and 1.09 at time of re-presentation but has slowly risen each day.  Of note: the son states all this started at Thanksgiving, not feeling well, eventually going to [facility] bec of black tarry stool -> EGD and subsequent neck pain -> evaluation at [facility] in [place]. Nausea also for a few weeks and now here with chest pain again, pleuritic in nature. Remote history of nephrolithiasis h/o ESWL last episode >5 years ago, no NSAID use and no ESRD/ CKD in the family.   Intake/Output Summary (Last 24 hours) at 12/11/2024 2019 Last data filed at 12/10/2024 2036 Gross per 24 hour  Intake 515 ml  Output --  Net 515 ml    Vitals:  Vitals:   12/10/24 2033 12/11/24 0500 12/11/24 0644 12/11/24 1333  BP: 124/62  121/69 118/62  Pulse: 73   68 68  Resp: 16  18 20   Temp: 98.3 F (36.8 C)  98.8 F (37.1 C) 98.8 F (37.1 C)  TempSrc: Oral  Oral Oral  SpO2: 96%  95% 97%  Weight:  83.1 kg    Height:         Physical Exam:     General elderly male in bed in no acute distress HEENT normocephalic atraumatic extraocular movements intact sclera anicteric Neck supple trachea midline Lungs clear to auscultation bilaterally normal work of breathing at rest on room air  Heart S1S2 no rub Abdomen soft nontender nondistended Extremities no edema  Psych normal mood and affect Neuro alert and oriented x 3 provides hx and follows commands [place] in place  Medications reviewed   Labs:     Latest Ref Rng & Units 12/11/2024    8:45 AM 12/10/2024    4:21 AM 12/09/2024    4:02 AM  BMP  Glucose 70 - 99 mg/dL 89  94  88   BUN 8 - 23 mg/dL 28  34  36   Creatinine 0.61 - 1.24 mg/dL 7.68  6.98  6.44   Sodium 135 - 145 mmol/L 140  140  138   Potassium 3.5 - 5.1 mmol/L 4.0  3.8  4.1   Chloride 98 - 111 mmol/L 113  110  109   CO2 22 - 32 mmol/L 19  19  19  Calcium  8.9 - 10.3 mg/dL 8.2  7.9  7.9      Assessment/Plan:   Assessment/Plan: AKI secondary to hypotension in the setting of infection and ATN.  Note baseline Cr 1.1 - 1.3.  U/S RK10.1 LK 11.5 with diffusely increased echogenicity. Bladder scan with 138 ml and then 67 mL - improving with supportive care.   -Monitor Daily I/Os, Daily weight  - avoid hypotension  - being treated for severe C dif per primary team    CAP recently treated with course of abx.  Ckd stage 3a baseline Cr 1.1 - 1.3 PAF -h/o ablation with  Eliquis  recently held because of GIB.  Anticoagulation per primary team discretion HTN - hypotensive here which has now improved; s/p gentle fluids as tolerated R/o MI with h/o CASHD CDif colitis being treated by the primary team. Normocytic anemia -  PRBC's per primary team. Iron sats 24%.  Low serum folate and on folic acid   GI is working up  Some  component may be dilutional   Disposition - disposition per primary team.  Nephrology will sign off.  I have requested follow-up with our office in 1-2 weeks.   Please do not hesitate to contact me with any questions regarding our patient     Katheryn JAYSON Saba, MD 12/11/2024 8:37 PM

## 2024-12-12 ENCOUNTER — Encounter (HOSPITAL_COMMUNITY): Payer: Self-pay | Admitting: Internal Medicine

## 2024-12-12 DIAGNOSIS — N1831 Chronic kidney disease, stage 3a: Secondary | ICD-10-CM | POA: Diagnosis not present

## 2024-12-12 DIAGNOSIS — I480 Paroxysmal atrial fibrillation: Secondary | ICD-10-CM | POA: Diagnosis not present

## 2024-12-12 DIAGNOSIS — I4719 Other supraventricular tachycardia: Secondary | ICD-10-CM | POA: Diagnosis not present

## 2024-12-12 DIAGNOSIS — C61 Malignant neoplasm of prostate: Secondary | ICD-10-CM | POA: Diagnosis not present

## 2024-12-12 DIAGNOSIS — I10 Essential (primary) hypertension: Secondary | ICD-10-CM | POA: Diagnosis not present

## 2024-12-12 DIAGNOSIS — I2510 Atherosclerotic heart disease of native coronary artery without angina pectoris: Secondary | ICD-10-CM | POA: Diagnosis not present

## 2024-12-12 DIAGNOSIS — R0782 Intercostal pain: Secondary | ICD-10-CM | POA: Diagnosis not present

## 2024-12-12 DIAGNOSIS — Z950 Presence of cardiac pacemaker: Secondary | ICD-10-CM | POA: Diagnosis not present

## 2024-12-12 DIAGNOSIS — A0472 Enterocolitis due to Clostridium difficile, not specified as recurrent: Secondary | ICD-10-CM | POA: Diagnosis not present

## 2024-12-12 DIAGNOSIS — D539 Nutritional anemia, unspecified: Secondary | ICD-10-CM | POA: Diagnosis not present

## 2024-12-12 DIAGNOSIS — J189 Pneumonia, unspecified organism: Secondary | ICD-10-CM | POA: Diagnosis not present

## 2024-12-12 LAB — CBC WITH DIFFERENTIAL/PLATELET
Abs Immature Granulocytes: 0.01 K/uL (ref 0.00–0.07)
Basophils Absolute: 0 K/uL (ref 0.0–0.1)
Basophils Relative: 1 %
Eosinophils Absolute: 0.1 K/uL (ref 0.0–0.5)
Eosinophils Relative: 5 %
HCT: 26.8 % — ABNORMAL LOW (ref 39.0–52.0)
Hemoglobin: 8.8 g/dL — ABNORMAL LOW (ref 13.0–17.0)
Immature Granulocytes: 0 %
Lymphocytes Relative: 31 %
Lymphs Abs: 0.9 K/uL (ref 0.7–4.0)
MCH: 31 pg (ref 26.0–34.0)
MCHC: 32.8 g/dL (ref 30.0–36.0)
MCV: 94.4 fL (ref 80.0–100.0)
Monocytes Absolute: 0.6 K/uL (ref 0.1–1.0)
Monocytes Relative: 20 %
Neutro Abs: 1.3 K/uL — ABNORMAL LOW (ref 1.7–7.7)
Neutrophils Relative %: 43 %
Platelets: 157 K/uL (ref 150–400)
RBC: 2.84 MIL/uL — ABNORMAL LOW (ref 4.22–5.81)
RDW: 23.7 % — ABNORMAL HIGH (ref 11.5–15.5)
Smear Review: NORMAL
WBC: 3 K/uL — ABNORMAL LOW (ref 4.0–10.5)
nRBC: 0 % (ref 0.0–0.2)

## 2024-12-12 LAB — BASIC METABOLIC PANEL WITH GFR
Anion gap: 10 (ref 5–15)
BUN: 25 mg/dL — ABNORMAL HIGH (ref 8–23)
CO2: 19 mmol/L — ABNORMAL LOW (ref 22–32)
Calcium: 8.4 mg/dL — ABNORMAL LOW (ref 8.9–10.3)
Chloride: 113 mmol/L — ABNORMAL HIGH (ref 98–111)
Creatinine, Ser: 1.99 mg/dL — ABNORMAL HIGH (ref 0.61–1.24)
GFR, Estimated: 32 mL/min — ABNORMAL LOW
Glucose, Bld: 87 mg/dL (ref 70–99)
Potassium: 4.1 mmol/L (ref 3.5–5.1)
Sodium: 142 mmol/L (ref 135–145)

## 2024-12-12 LAB — GLUCOSE, CAPILLARY: Glucose-Capillary: 92 mg/dL (ref 70–99)

## 2024-12-12 MED ORDER — SODIUM CHLORIDE 0.9 % IV SOLN: INTRAVENOUS | Status: AC

## 2024-12-12 MED ORDER — ACETAMINOPHEN-CODEINE 300-30 MG PO TABS: 1.0000 | ORAL_TABLET | Freq: Four times a day (QID) | ORAL | Status: DC | PRN

## 2024-12-12 NOTE — Progress Notes (Signed)
 Patient to have capsule downloaded by endoscopy team later today unfortunately they were unable to download this morning and my partner Dr. Kriss will read it tomorrow and please call us  sooner if any specific GI question or problem we can help with in the meantime

## 2024-12-12 NOTE — Progress Notes (Signed)
 Mobility Specialist Progress Note:   12/12/24 1502  Mobility  Activity Ambulated with assistance  Level of Assistance Standby assist, set-up cues, supervision of patient - no hands on  Assistive Device Front wheel walker  Distance Ambulated (ft) 250 ft  Activity Response Tolerated well  Mobility Referral Yes  Mobility visit 1 Mobility  Mobility Specialist Start Time (ACUTE ONLY) 1418  Mobility Specialist Stop Time (ACUTE ONLY) 1428  Mobility Specialist Time Calculation (min) (ACUTE ONLY) 10 min   Pt was received in bed and agreed to mobility. No complaints/issues during session. Returned to bed with all needs met, call bell in reach.  [employer] - Mobility Specialist

## 2024-12-12 NOTE — Progress Notes (Signed)
 Occupational Therapy Treatment Patient Details Name: Todd Salazar MRN: [id number] DOB: 1936/08/11 Today's Date: 12/12/2024   History of present illness 89 yr old male admitted with chest pain, possible PNA. PMH: CAD, CKD, restrictive lung disease, colon CA, Afib, RLS, pacemaker, prostate CA   OT comments  The pt was motivated to participate in the session. He was seen for functional strengthening and progression of ADL participation. He required SBA for upper body dressing seated EOB, to stand using a RW, for functional ambulation into the hall using a RW, and for upper body grooming standing at the sink. He had difficulty with sock management, however he reported using a sock aid to assist in this regard at his baseline. He is making gradual functional progress. OT will continue to follow him in the acute care setting. No post-acute care OT needs anticipated.       If plan is discharge home, recommend the following:  A little help with walking and/or transfers;A little help with bathing/dressing/bathroom;Assistance with cooking/housework;Assist for transportation;Help with stairs or ramp for entrance   Equipment Recommendations  None recommended by OT    Recommendations for Other Services      Precautions / Restrictions Restrictions Weight Bearing Restrictions Per Provider Order: No       Mobility Bed Mobility Overal bed mobility: Modified Independent Bed Mobility: Supine to Sit     Supine to sit: Modified independent (Device/Increase time)          Transfers Overall transfer level: Needs assistance Equipment used: Rolling walker (2 wheels) Transfers: Sit to/from Stand Sit to Stand: Supervision                 Balance     Sitting balance-Leahy Scale: Good       Standing balance-Leahy Scale: Fair           ADL either performed or assessed with clinical judgement   ADL Overall ADL's : Needs assistance/impaired     Grooming: Supervision/safety;Set  up;Standing Grooming Details (indicate cue type and reason): The pt required SBA for perform hand washing and denture standing at the sink.         Upper Body Dressing : Sitting;Set up Upper Body Dressing Details (indicate cue type and reason): He donned a hospital gown around his back seated EOB. Lower Body Dressing: Moderate assistance;With adaptive equipment;Sitting/lateral leans Lower Body Dressing Details (indicate cue type and reason): Pt was unable to donn his socks seated EOB. He reported using a sock aid to do so at his baseline.                              Communication Communication Communication: No apparent difficulties   Cognition Arousal: Alert Behavior During Therapy: WFL for tasks assessed/performed Cognition: No apparent impairments          Following commands: Intact        Cueing   Cueing Techniques: Verbal cues             Pertinent Vitals/ Pain       Pain Assessment Pain Location: He reported mild chest pain. Pain Intervention(s): Monitored during session, Patient requesting pain meds-RN notified   Frequency  Min 2X/week        Progress Toward Goals  OT Goals(current goals can now be found in the care plan section)  Progress towards OT goals: Progressing toward goals  Acute Rehab OT Goals OT Goal Formulation: With patient Time For Goal  Achievement: 12/23/24 Potential to Achieve Goals: Good  Plan         AM-PAC OT 6 Clicks Daily Activity     Outcome Measure   Help from another person eating meals?: None Help from another person taking care of personal grooming?: A Little Help from another person toileting, which includes using toliet, bedpan, or urinal?: A Little Help from another person bathing (including washing, rinsing, drying)?: A Little Help from another person to put on and taking off regular upper body clothing?: A Little Help from another person to put on and taking off regular lower body clothing?: A  Little 6 Click Score: 19    End of Session Equipment Utilized During Treatment: Gait belt;Rolling walker (2 wheels)  OT Visit Diagnosis: Other abnormalities of gait and mobility (R26.89);Muscle weakness (generalized) (M62.81);Pain Pain - part of body:  (chest)   Activity Tolerance Patient tolerated treatment well   Patient Left in chair;with call bell/phone within reach;with chair alarm set   Nurse Communication Mobility status        Time: 8994-8976 OT Time Calculation (min): 18 min  Charges: OT General Charges $OT Visit: 1 Visit OT Treatments $Self Care/Home Management : 8-22 mins    Delanna JINNY Lesches, OTR/L 12/12/2024, 12:11 PM

## 2024-12-12 NOTE — Progress Notes (Signed)
 " PROGRESS NOTE    Todd Salazar  FMW:[id number] DOB: 11/10/1936 DOA: 12/05/2024 PCP: Patient, No Pcp Per    Chief Complaint  Patient presents with   Chest Pain    Brief Narrative:  Todd Salazar is a 89 y.o. male with medical history significant of anemia, CAD, A-fib (has been off Eliquis  secondary to GI bleed), colon cancer, HLD, RLS, prostate cancer, restrictive lung disease, bronchiectasis, CKD stage III presenting with persistent left sided chest pain with dyspnea, ongoing for the past couple of weeks, now with nausea and recurrent vomiting for the past couple of days.  Of note, patient has been in and out of hospitals including [facility] as well as current health.  Last admission in 11/30/2024 for pneumonia and associated chest pain/shortness of breath, was discharged 12/21.  Presented again on 12/25 for persistent chest pain with orthopnea and dyspnea as well as decreased appetite with nausea and recurrent vomiting.  In the ED, VS fairly stable with some noted tachycardia. Labs showed fairly stable except for BNP 500-->4,689.0, troponin 30, flat trend.  Cardiology consulted.  Patient admitted for further management.     Assessment & Plan:   Principal Problem:   Chest pain Active Problems:   CAD (coronary artery disease), native coronary artery   Carcinoma of prostate (HCC)   CAP (community acquired pneumonia)   Macrocytic anemia   Anxiety   Current use of long term anticoagulation   Acute respiratory failure with hypoxia (HCC)   Elevated brain natriuretic peptide (BNP) level   HTN (hypertension)   PAF (paroxysmal atrial fibrillation) (HCC)   CKD-3A   Pacemaker   Atrial tachycardia   C. difficile colitis  #1 chest pain/history of CAD/??  Recurrent pneumonia/?/Pericarditis/noted restrictive lung disease -Patient with complaints of chest pain on presentation unknown etiology unlikely felt to be ACS. - Patient afebrile with resolution of leukocytosis. -  Troponins minimally elevated but flattened. - EKG with no ischemic changes noted. - 2D echo done 12/01/2024 with EF of 60 to 65%, no regional wall motion abnormalities, right ventricular systolic function normal. - BNP noted to be elevated 500>>> 4689. - LDL noted at 38. - Chest x-ray negative for volume overload with concern for persistent pneumonia. - CRP 5.2, ESR 22. - Procalcitonin, lactic acid within normal limits. - CT chest abdomen and pelvis showed stable small focal airspace consolidation in the posterior right lower lobe, questionable focal wall thickening in the mid ascending colon without acute inflammatory stranding.  Diffuse colonic diverticulosis. - Patient seen in consultation by cardiology who have signed off.  2.  C. difficile colitis/intractable nausea and vomiting-improving -Patient noted with complaints of diarrhea, stool studies positive for C. difficile. - CT abdomen and pelvis showed questionable focal wall thickening in the mid descending colon without acute inflammatory stranding.  Diffuse colonic diverticulosis.  Consider colonoscopy for further evaluation once fully recovered.  Also noted bilateral renal cysts, largest in the left kidney measuring 4.1 cm with numerous punctuate left renal calculi. - Slowly improving clinically. - Continue Dificid  x 10 days. - IV fluids, antiemetics, supportive care.  3.  AKI on CKD stage III/ATN possibly from C. difficile colitis/hypotension/?  GI bleed - Baseline creatinine approximately 1.1-1.3. - Creatinine noted to have gone up as high as 3.55. - CT abdomen and pelvis showed bilateral renal cysts, numerous punctuate left renal calculi. - Renal ultrasound with bilateral echogenic kidneys, bilateral simple renal cysts. - Patient hydrated gently with IV fluids with renal function improving with creatinine currently  down to 2.31.. - Patient seen in consultation by nephrology.  4.  Normocytic anemia/anemia of chronic  disease/folate deficiency/history of GI bleed-recent EGD with [facility] on 11/11/2024 showed gastritis - Patient noted to have a hemoglobin drop from 7.5-6.7. - FOBT positive. - GI consulted and recommended capsule endoscopy which is pending.. - Anemia panel done 11/30/2024 with iron of 43, ferritin greater than 8000, folate of 4, vitamin B12 greater than 1000. - Status post transfusion 2 units PRBC 12/10/2024. - Continue folic acid . - Eliquis  on hold will need outpatient follow-up with cardiology and GI to determine whether Eliquis  may be resumed. - Patient undergoing VCE today. - GI following and appreciate input and recommendations.  5.  Hypertension -Stable. - Metoprolol  discontinued, amiodarone  on hold.  6.  Persistent A-fib/SSS status post PPM/history of atrial tachycardia/PVCs/atrial tachycardia/history of ablation -Patient currently rate controlled. - Cardiology was following okay with holding amiodarone , if A-fib with RVR occur again may restart IV amiodarone . - Continue to hold Eliquis  due to concern for GI bleed. - Patient noted to have failed sotalol , flecainide , Tikosyn , propafenone and also not tolerated AV nodal blocking agents. - Outpatient follow-up with cardiology.  7.  Carcinoma of the prostate -In remission. - Outpatient follow-up.  8.  Pressure injury perineum medial stage II, POA Wound 12/06/24 1323 Pressure Injury Perineum Medial Stage 2 -  Partial thickness loss of dermis presenting as a shallow open injury with a red, pink wound bed without slough. (Active)       DVT prophylaxis: SCDs Code Status: Full Family Communication: Updated patient.  No family at bedside. Disposition: TBD  Status is: Inpatient Remains inpatient appropriate because: Severity of illness   Consultants:  Gastroenterology: Dr. Kriss 12/10/2024 ID: Dr. Overton 12/08/2024 Nephrology: Dr. Melia 12/08/2024 Cardiology: Dr. Delford 12/06/2024  Procedures:  Abdominal films from  2023 Chest x-ray 12/05/2024 Renal ultrasound 12/08/2024 CT chest abdomen pelvis 12/06/2024  Antimicrobials:  Anti-infectives (From admission, onward)    Start     Dose/Rate Route Frequency Ordered Stop   12/09/24 1800  cefTRIAXone  (ROCEPHIN ) 2 g in sodium chloride  0.9 % 100 mL IVPB  Status:  Discontinued        2 g 200 mL/hr over 30 Minutes Intravenous Every 24 hours 12/09/24 0759 12/09/24 0805   12/09/24 1000  azithromycin  (ZITHROMAX ) tablet 500 mg  Status:  Discontinued        500 mg Oral Daily 12/09/24 0759 12/09/24 0805   12/07/24 1300  fidaxomicin  (DIFICID ) tablet 200 mg        200 mg Oral 2 times daily 12/07/24 1207 12/17/24 0959   12/06/24 1500  cefTRIAXone  (ROCEPHIN ) 2 g in sodium chloride  0.9 % 100 mL IVPB  Status:  Discontinued        2 g 200 mL/hr over 30 Minutes Intravenous Every 24 hours 12/05/24 1701 12/08/24 1842   12/06/24 1000  azithromycin  (ZITHROMAX ) tablet 500 mg  Status:  Discontinued        500 mg Oral Daily 12/05/24 1654 12/08/24 1842   12/05/24 2000  cefUROXime  (CEFTIN ) tablet 500 mg  Status:  Discontinued        500 mg Oral 2 times daily with meals 12/05/24 1654 12/05/24 1701   12/05/24 1445  cefTRIAXone  (ROCEPHIN ) 2 g in sodium chloride  0.9 % 100 mL IVPB        2 g 200 mL/hr over 30 Minutes Intravenous Once 12/05/24 1436 12/05/24 1530   12/05/24 1445  azithromycin  (ZITHROMAX ) 500 mg in sodium chloride   0.9 % 250 mL IVPB        500 mg 250 mL/hr over 60 Minutes Intravenous  Once 12/05/24 1436 12/05/24 1641         Subjective: Patient lying in bed.  Denies any chest pain or shortness of breath.  No abdominal pain.  States has not had a bowel movement this morning.  Overall feels well.   Objective: Vitals:   12/11/24 1333 12/11/24 2039 12/12/24 0605 12/12/24 1304  BP: 118/62 134/66 125/61 123/67  Pulse: 68 73 64 71  Resp: 20 18 18 18   Temp: 98.8 F (37.1 C) 99 F (37.2 C) 98.8 F (37.1 C) (!) 97.5 F (36.4 C)  TempSrc: Oral Oral Oral Oral  SpO2:  97% 96% 97% 98%  Weight:      Height:        Intake/Output Summary (Last 24 hours) at 12/12/2024 1420 Last data filed at 12/12/2024 1300 Gross per 24 hour  Intake 580 ml  Output 1470 ml  Net -890 ml   Filed Weights   12/07/24 0500 12/08/24 0500 12/11/24 0500  Weight: 75.2 kg 75.4 kg 83.1 kg    Examination:  General exam: NAD. Respiratory system: CTAB.  No wheezes, no crackles, no rhonchi.  Fair air movement.  Speaking in full sentences.  Cardiovascular system: Regular rate and rhythm no murmurs rubs or gallop.  No JVD.  No lower extremity edema.  Gastrointestinal system: Abdomen is soft, nontender, nondistended, positive bowel sounds.  No rebound.  No guarding.  Central nervous system: Alert and oriented. No focal neurological deficits. Extremities: Symmetric 5 x 5 power. Skin: No rashes, lesions or ulcers Psychiatry: Judgement and insight appear normal. Mood & affect appropriate.     Data Reviewed: I have personally reviewed following labs and imaging studies  CBC: Recent Labs  Lab 12/08/24 0734 12/09/24 0402 12/10/24 0421 12/10/24 1536 12/10/24 2257 12/11/24 0845 12/12/24 0438  WBC 9.1 6.1 3.5*  --   --  3.3* 3.0*  NEUTROABS  --   --   --   --   --   --  1.3*  HGB 8.2* 7.5* 6.7* 7.9* 8.7* 9.6* 8.8*  HCT 25.0* 23.7* 20.7* 24.3* 26.4* 28.6* 26.8*  MCV 97.7 99.2 97.6  --   --  93.8 94.4  PLT 183 176 153  --   --  156 157    Basic Metabolic Panel: Recent Labs  Lab 12/05/24 1848 12/06/24 0410 12/06/24 1045 12/07/24 0008 12/08/24 0434 12/09/24 0402 12/10/24 0421 12/11/24 0845 12/12/24 0438  NA  --    < >  --    < > 135 138 140 140 142  K  --    < >  --    < > 4.5 4.1 3.8 4.0 4.1  CL  --    < >  --    < > 104 109 110 113* 113*  CO2  --    < >  --    < > 20* 19* 19* 19* 19*  GLUCOSE  --    < >  --    < > 99 88 94 89 87  BUN  --    < >  --    < > 33* 36* 34* 28* 25*  CREATININE  --    < >  --    < > 3.66* 3.55* 3.01* 2.31* 1.99*  CALCIUM   --    < >  --    < >  7.9* 7.9* 7.9*  8.2* 8.4*  MG 1.6*  --  2.4  --   --   --   --   --   --   PHOS 2.7  --   --   --   --   --   --   --   --    < > = values in this interval not displayed.    GFR: Estimated Creatinine Clearance: 31.3 mL/min (A) (by C-G formula based on SCr of 1.99 mg/dL (H)).  Liver Function Tests: Recent Labs  Lab 12/05/24 1521 12/06/24 0410  AST 17 15  ALT 12 11  ALKPHOS 84 100  BILITOT 1.6* 1.2  PROT 6.7 7.4  ALBUMIN 4.0 4.3    CBG: Recent Labs  Lab 12/08/24 0720 12/09/24 0803 12/10/24 0755 12/11/24 0758 12/12/24 0744  GLUCAP 108* 84 104* 91 92     Recent Results (from the past 240 hours)  Blood Culture (routine x 2)     Status: None   Collection Time: 12/05/24  3:10 PM   Specimen: Right Antecubital; Blood  Result Value Ref Range Status   Specimen Description   Final    RIGHT ANTECUBITAL Performed at [facility], [address] [place], [place] [address]    Special Requests   Final    BOTTLES DRAWN AEROBIC AND ANAEROBIC Blood Culture adequate volume Performed at [facility], [address] [place], [place] [address]    Culture   Final    NO GROWTH 5 DAYS Performed at [facility], [address] [place], [place] [address]    Report Status 12/10/2024 FINAL  Final  Blood Culture (routine x 2)     Status: None   Collection Time: 12/05/24  3:15 PM   Specimen: BLOOD RIGHT FOREARM  Result Value Ref Range Status   Specimen Description   Final    BLOOD RIGHT FOREARM Performed at [facility], [address], [place], [place] [address]    Special Requests   Final    BOTTLES DRAWN AEROBIC ONLY BLOOD RIGHT FOREARM Performed at [facility], [address], [place], [place] [address]    Culture   Final    NO GROWTH 5 DAYS Performed at [facility], [address], [place], [place] [address]    Report Status 12/10/2024 FINAL  Final  MRSA Next Gen by PCR, Nasal      Status: Abnormal   Collection Time: 12/05/24 10:08 PM   Specimen: Nasal Mucosa; Nasal Swab  Result Value Ref Range Status   MRSA by PCR Next Gen DETECTED (A) NOT DETECTED Final    Comment: RESULT CALLED TO, READ BACK BY AND VERIFIED WITH: JONETTA LESCHES, RN [phone] 12/06/24 BY ALONSO CORDS (NOTE) The GeneXpert MRSA Assay (FDA approved for NASAL specimens only), is one component of a comprehensive MRSA colonization surveillance program. It is not intended to diagnose MRSA infection nor to guide or monitor treatment for MRSA infections. Test performance is not FDA approved in patients less than 41 years old. Performed at [facility], [address] [place], [place] [address]   C Difficile Quick Screen w PCR reflex     Status: Abnormal   Collection Time: 12/07/24 10:58 AM   Specimen: STOOL  Result Value Ref Range Status   C Diff antigen POSITIVE (A) NEGATIVE Final   C Diff toxin POSITIVE (A) NEGATIVE Final   C Diff interpretation Toxin producing C. difficile detected.  Final    Comment: CRITICAL RESULT CALLED TO, READ BACK BY AND VERIFIED WITH: KATHEE KURTZ, RN ON 12/07/2024 AT 1131 BY SL Performed at [facility], [address], [place], [place] [address]   Gastrointestinal Panel by PCR , Stool     Status: None   Collection Time: 12/07/24 10:58 AM   Specimen: STOOL  Result Value Ref Range Status   Campylobacter species NOT DETECTED NOT DETECTED Final   Plesimonas shigelloides NOT DETECTED NOT DETECTED Final   Salmonella species NOT DETECTED NOT DETECTED Final   Yersinia enterocolitica NOT DETECTED NOT DETECTED Final   Vibrio species NOT DETECTED NOT DETECTED Final   Vibrio cholerae NOT DETECTED NOT DETECTED Final   Enteroaggregative E coli (EAEC) NOT DETECTED NOT DETECTED Final   Enteropathogenic E coli (EPEC) NOT DETECTED NOT DETECTED Final   Enterotoxigenic E coli (ETEC) NOT DETECTED NOT DETECTED Final   Shiga like toxin producing E coli (STEC) NOT  DETECTED NOT DETECTED Final   Shigella/Enteroinvasive E coli (EIEC) NOT DETECTED NOT DETECTED Final   Cryptosporidium NOT DETECTED NOT DETECTED Final   Cyclospora cayetanensis NOT DETECTED NOT DETECTED Final   Entamoeba histolytica NOT DETECTED NOT DETECTED Final   Giardia lamblia NOT DETECTED NOT DETECTED Final   Adenovirus F40/41 NOT DETECTED NOT DETECTED Final   Astrovirus NOT DETECTED NOT DETECTED Final   Norovirus GI/GII NOT DETECTED NOT DETECTED Final   Rotavirus A NOT DETECTED NOT DETECTED Final   Sapovirus (I, II, IV, and V) NOT DETECTED NOT DETECTED Final    Comment: Performed at [facility], [address], [place], [place] [address]         Radiology Studies: No results found.      Scheduled Meds:  feeding supplement  237 mL Oral BID BM   ferrous sulfate   325 mg Oral QODAY   fidaxomicin   200 mg Oral BID   folic acid   1 mg Oral Daily   sodium chloride  flush  3 mL Intravenous Q12H   sodium chloride  flush  3 mL Intravenous Q12H   sucralfate   1 g Oral TID WC & HS   Continuous Infusions:  sodium chloride  100 mL/hr at 12/12/24 1040   famotidine  (PEPCID ) IV 20 mg (12/12/24 1041)   promethazine  (PHENERGAN ) injection (IM or IVPB) Stopped (12/08/24 1032)     LOS: 7 days    Time spent: 40 minutes    Toribio Hummer, MD [facility]   To contact the attending provider between 7A-7P or the covering provider during after hours 7P-7A, please log into the web site [email or web address] and access using universal [place] password for that web site. If you do not have the password, please call the hospital operator.  12/12/2024, 2:20 PM    "

## 2024-12-13 ENCOUNTER — Inpatient Hospital Stay (HOSPITAL_COMMUNITY)

## 2024-12-13 DIAGNOSIS — F419 Anxiety disorder, unspecified: Secondary | ICD-10-CM

## 2024-12-13 DIAGNOSIS — R079 Chest pain, unspecified: Secondary | ICD-10-CM

## 2024-12-13 LAB — CBC
HCT: 25.4 % — ABNORMAL LOW (ref 39.0–52.0)
Hemoglobin: 8.6 g/dL — ABNORMAL LOW (ref 13.0–17.0)
MCH: 31 pg (ref 26.0–34.0)
MCHC: 33.9 g/dL (ref 30.0–36.0)
MCV: 91.7 fL (ref 80.0–100.0)
Platelets: 154 K/uL (ref 150–400)
RBC: 2.77 MIL/uL — ABNORMAL LOW (ref 4.22–5.81)
RDW: 23.1 % — ABNORMAL HIGH (ref 11.5–15.5)
WBC: 3.5 K/uL — ABNORMAL LOW (ref 4.0–10.5)
nRBC: 0 % (ref 0.0–0.2)

## 2024-12-13 LAB — BASIC METABOLIC PANEL WITH GFR
Anion gap: 9 (ref 5–15)
BUN: 20 mg/dL (ref 8–23)
CO2: 19 mmol/L — ABNORMAL LOW (ref 22–32)
Calcium: 8.1 mg/dL — ABNORMAL LOW (ref 8.9–10.3)
Chloride: 115 mmol/L — ABNORMAL HIGH (ref 98–111)
Creatinine, Ser: 1.65 mg/dL — ABNORMAL HIGH (ref 0.61–1.24)
GFR, Estimated: 40 mL/min — ABNORMAL LOW
Glucose, Bld: 86 mg/dL (ref 70–99)
Potassium: 4.7 mmol/L (ref 3.5–5.1)
Sodium: 144 mmol/L (ref 135–145)

## 2024-12-13 LAB — GLUCOSE, CAPILLARY: Glucose-Capillary: 79 mg/dL (ref 70–99)

## 2024-12-13 MED ORDER — ALUM & MAG HYDROXIDE-SIMETH 200-200-20 MG/5ML PO SUSP
30.0000 mL | Freq: Once | ORAL | Status: AC
  Administered 2024-12-13: 30 mL via ORAL
  Filled 2024-12-13: qty 30

## 2024-12-13 MED ORDER — ALUM & MAG HYDROXIDE-SIMETH 200-200-20 MG/5ML PO SUSP
30.0000 mL | Freq: Four times a day (QID) | ORAL | Status: DC | PRN
  Administered 2024-12-13: 30 mL via ORAL
  Filled 2024-12-13: qty 30

## 2024-12-13 MED ORDER — HYDROMORPHONE HCL 1 MG/ML IJ SOLN: 0.5000 mg | Freq: Four times a day (QID) | INTRAMUSCULAR | Status: DC | PRN

## 2024-12-13 MED ORDER — LIDOCAINE VISCOUS HCL 2 % MT SOLN
15.0000 mL | Freq: Once | OROMUCOSAL | Status: AC
  Administered 2024-12-13: 15 mL via ORAL
  Filled 2024-12-13: qty 15

## 2024-12-13 MED ORDER — FAMOTIDINE 20 MG PO TABS
20.0000 mg | ORAL_TABLET | Freq: Every day | ORAL | Status: DC
  Administered 2024-12-14: 20 mg via ORAL
  Filled 2024-12-13: qty 1

## 2024-12-13 MED ORDER — LIDOCAINE VISCOUS HCL 2 % MT SOLN: 15.0000 mL | Freq: Four times a day (QID) | OROMUCOSAL | Status: DC | PRN

## 2024-12-13 NOTE — Progress Notes (Signed)
 " PROGRESS NOTE    Todd Salazar  FMW:[id number] DOB: 1936/09/23 DOA: 12/05/2024 PCP: Patient, No Pcp Per    Chief Complaint  Patient presents with   Chest Pain    Brief Narrative:  Todd Salazar is a 89 y.o. male with medical history significant of anemia, CAD, A-fib (has been off Eliquis  secondary to GI bleed), colon cancer, HLD, RLS, prostate cancer, restrictive lung disease, bronchiectasis, CKD stage III presenting with persistent left sided chest pain with dyspnea, ongoing for the past couple of weeks, now with nausea and recurrent vomiting for the past couple of days.  Of note, patient has been in and out of hospitals including [facility] as well as current health.  Last admission in 11/30/2024 for pneumonia and associated chest pain/shortness of breath, was discharged 12/21.  Presented again on 12/25 for persistent chest pain with orthopnea and dyspnea as well as decreased appetite with nausea and recurrent vomiting.  In the ED, VS fairly stable with some noted tachycardia. Labs showed fairly stable except for BNP 500-->4,689.0, troponin 30, flat trend.  Cardiology consulted.  Patient admitted for further management.     Assessment & Plan:   Principal Problem:   Chest pain Active Problems:   CAD (coronary artery disease), native coronary artery   Carcinoma of prostate (HCC)   CAP (community acquired pneumonia)   Macrocytic anemia   Anxiety   Current use of long term anticoagulation   Acute respiratory failure with hypoxia (HCC)   Elevated brain natriuretic peptide (BNP) level   HTN (hypertension)   PAF (paroxysmal atrial fibrillation) (HCC)   CKD-3A   Pacemaker   Atrial tachycardia   C. difficile colitis  #1 chest pain/history of CAD/??  Recurrent pneumonia/?/Pericarditis/noted restrictive lung disease -Patient with complaints of chest pain on presentation unknown etiology unlikely felt to be ACS. - Patient afebrile with resolution of leukocytosis. -  Troponins minimally elevated but flattened. - EKG with no ischemic changes noted. - 2D echo done 12/01/2024 with EF of 60 to 65%, no regional wall motion abnormalities, right ventricular systolic function normal. - BNP noted to be elevated 500>>> 4689. - LDL noted at 38. - Chest x-ray negative for volume overload with concern for persistent pneumonia. - CRP 5.2, ESR 22. - Procalcitonin, lactic acid within normal limits. - CT chest abdomen and pelvis showed stable small focal airspace consolidation in the posterior right lower lobe, questionable focal wall thickening in the mid ascending colon without acute inflammatory stranding.  Diffuse colonic diverticulosis. - Patient seen in consultation by cardiology who have signed off. - Trial of GI cocktail. - Start Pepcid  20 mg daily.  2.  C. difficile colitis/intractable nausea and vomiting-improving -Patient noted with complaints of diarrhea, stool studies positive for C. difficile. - CT abdomen and pelvis showed questionable focal wall thickening in the mid descending colon without acute inflammatory stranding.  Diffuse colonic diverticulosis.  Consider colonoscopy for further evaluation once fully recovered.  Also noted bilateral renal cysts, largest in the left kidney measuring 4.1 cm with numerous punctuate left renal calculi. - Clinical improvement.  No bowel movement in the past 24 to 48 hours per patient.  - Continue Dificid  x 10 days. - IV fluids, antiemetics, supportive care.  3.  AKI on CKD stage III/ATN possibly from C. difficile colitis/hypotension/?  GI bleed - Baseline creatinine approximately 1.1-1.3. - Creatinine noted to have gone up as high as 3.55. - CT abdomen and pelvis showed bilateral renal cysts, numerous punctuate left renal calculi. -  Renal ultrasound with bilateral echogenic kidneys, bilateral simple renal cysts. - Patient hydrated gently with IV fluids with renal function improving with creatinine currently down to  1.65. -Saline lock IV fluids. - Patient was seen in consultation by nephrology who have signed off.  4.  Normocytic anemia/anemia of chronic disease/folate deficiency/history of GI bleed-recent EGD with [facility] on 11/11/2024 showed gastritis - Patient noted to have a hemoglobin drop from 7.5-6.7. - FOBT positive. - GI consulted and recommended capsule endoscopy which is pending.. - Anemia panel done 11/30/2024 with iron of 43, ferritin greater than 8000, folate of 4, vitamin B12 greater than 1000. - Status post transfusion 2 units PRBC 12/10/2024. - Continue folic acid . - Eliquis  on hold will need outpatient follow-up with cardiology and GI to determine whether Eliquis  may be resumed. - Patient status post VCE and unknown where capsule is.   - Abdominal films done by GI this morning, with radiodense like endoscopy projects over the right hemipelvis, possibly projecting over the cecum or sigmoid colon.  Bibasilar pulmonary opacities.  - GI following and appreciate input and recommendations.  5.  Hypertension -Stable. - Metoprolol  discontinued, amiodarone  on hold.  6.  Persistent A-fib/SSS status post PPM/history of atrial tachycardia/PVCs/atrial tachycardia/history of ablation -Patient currently rate controlled. - Cardiology was following and okay with holding amiodarone , if A-fib with RVR occur again may restart IV amiodarone . - Continue to hold Eliquis  due to concern for GI bleed. - Patient noted to have failed sotalol , flecainide , Tikosyn , propafenone and also not tolerated AV nodal blocking agents. - Outpatient follow-up with cardiology.  7.  Carcinoma of the prostate -In remission. - Outpatient follow-up.  8.  Pressure injury perineum medial stage II, POA Wound 12/06/24 1323 Pressure Injury Perineum Medial Stage 2 -  Partial thickness loss of dermis presenting as a shallow open injury with a red, pink wound bed without slough. (Active)       DVT prophylaxis:  SCDs Code Status: Full Family Communication: Updated patient.  No family at bedside. Disposition: TBD  Status is: Inpatient Remains inpatient appropriate because: Severity of illness   Consultants:  Gastroenterology: Dr. Kriss 12/10/2024 ID: Dr. Overton 12/08/2024 Nephrology: Dr. Melia 12/08/2024 Cardiology: Dr. Delford 12/06/2024  Procedures:  Abdominal films 12/08/2024, 12/13/2024 Chest x-ray 12/05/2024 Renal ultrasound 12/08/2024 CT chest abdomen pelvis 12/06/2024  Antimicrobials:  Anti-infectives (From admission, onward)    Start     Dose/Rate Route Frequency Ordered Stop   12/09/24 1800  cefTRIAXone  (ROCEPHIN ) 2 g in sodium chloride  0.9 % 100 mL IVPB  Status:  Discontinued        2 g 200 mL/hr over 30 Minutes Intravenous Every 24 hours 12/09/24 0759 12/09/24 0805   12/09/24 1000  azithromycin  (ZITHROMAX ) tablet 500 mg  Status:  Discontinued        500 mg Oral Daily 12/09/24 0759 12/09/24 0805   12/07/24 1300  fidaxomicin  (DIFICID ) tablet 200 mg        200 mg Oral 2 times daily 12/07/24 1207 12/17/24 0959   12/06/24 1500  cefTRIAXone  (ROCEPHIN ) 2 g in sodium chloride  0.9 % 100 mL IVPB  Status:  Discontinued        2 g 200 mL/hr over 30 Minutes Intravenous Every 24 hours 12/05/24 1701 12/08/24 1842   12/06/24 1000  azithromycin  (ZITHROMAX ) tablet 500 mg  Status:  Discontinued        500 mg Oral Daily 12/05/24 1654 12/08/24 1842   12/05/24 2000  cefUROXime  (CEFTIN ) tablet 500 mg  Status:  Discontinued        500 mg Oral 2 times daily with meals 12/05/24 1654 12/05/24 1701   12/05/24 1445  cefTRIAXone  (ROCEPHIN ) 2 g in sodium chloride  0.9 % 100 mL IVPB        2 g 200 mL/hr over 30 Minutes Intravenous Once 12/05/24 1436 12/05/24 1530   12/05/24 1445  azithromycin  (ZITHROMAX ) 500 mg in sodium chloride  0.9 % 250 mL IVPB        500 mg 250 mL/hr over 60 Minutes Intravenous  Once 12/05/24 1436 12/05/24 1641         Subjective: Patient sitting up in chair.  Denies any shortness  of breath.  Noted to have had chest pain earlier on this morning which she states was relieved by GI cocktail that was given. No abdominal pain.  States has not had a bowel movement in the past 24 to 48 hours.  Patient states they cannot find the capsule for the VCE.   Objective: Vitals:   12/12/24 1304 12/12/24 2019 12/13/24 0433 12/13/24 0500  BP: 123/67 135/73 125/65   Pulse: 71 67 67   Resp: 18 19 16    Temp: (!) 97.5 F (36.4 C) 98.7 F (37.1 C) 98.8 F (37.1 C)   TempSrc: Oral Oral Oral   SpO2: 98% 96% 94%   Weight:    85.4 kg  Height:        Intake/Output Summary (Last 24 hours) at 12/13/2024 1316 Last data filed at 12/13/2024 1252 Gross per 24 hour  Intake 1641.18 ml  Output 2000 ml  Net -358.82 ml   Filed Weights   12/08/24 0500 12/11/24 0500 12/13/24 0500  Weight: 75.4 kg 83.1 kg 85.4 kg    Examination:  General exam: NAD. Respiratory system: Lungs clear to auscultation bilaterally.  No wheezes, no crackles, no rhonchi.  Fair air movement.  Speaking in full sentences.   Cardiovascular system: RRR no murmurs rubs or gallops.  No JVD.  No lower extremity edema.  Gastrointestinal system: Abdomen is soft, nontender, nondistended, positive bowel sounds.  No rebound.  No guarding.  Central nervous system: Alert and oriented. No focal neurological deficits. Extremities: Symmetric 5 x 5 power. Skin: No rashes, lesions or ulcers Psychiatry: Judgement and insight appear normal. Mood & affect appropriate.     Data Reviewed: I have personally reviewed following labs and imaging studies  CBC: Recent Labs  Lab 12/09/24 0402 12/10/24 0421 12/10/24 1536 12/10/24 2257 12/11/24 0845 12/12/24 0438 12/13/24 0410  WBC 6.1 3.5*  --   --  3.3* 3.0* 3.5*  NEUTROABS  --   --   --   --   --  1.3*  --   HGB 7.5* 6.7* 7.9* 8.7* 9.6* 8.8* 8.6*  HCT 23.7* 20.7* 24.3* 26.4* 28.6* 26.8* 25.4*  MCV 99.2 97.6  --   --  93.8 94.4 91.7  PLT 176 153  --   --  156 157 154    Basic  Metabolic Panel: Recent Labs  Lab 12/09/24 0402 12/10/24 0421 12/11/24 0845 12/12/24 0438 12/13/24 0410  NA 138 140 140 142 144  K 4.1 3.8 4.0 4.1 4.7  CL 109 110 113* 113* 115*  CO2 19* 19* 19* 19* 19*  GLUCOSE 88 94 89 87 86  BUN 36* 34* 28* 25* 20  CREATININE 3.55* 3.01* 2.31* 1.99* 1.65*  CALCIUM  7.9* 7.9* 8.2* 8.4* 8.1*    GFR: Estimated Creatinine Clearance: 38.4 mL/min (A) (by C-G formula based on SCr  of 1.65 mg/dL (H)).  Liver Function Tests: No results for input(s): AST, ALT, ALKPHOS, BILITOT, PROT, ALBUMIN in the last 168 hours.   CBG: Recent Labs  Lab 12/09/24 0803 12/10/24 0755 12/11/24 0758 12/12/24 0744 12/13/24 0737  GLUCAP 84 104* 91 92 79     Recent Results (from the past 240 hours)  Blood Culture (routine x 2)     Status: None   Collection Time: 12/05/24  3:10 PM   Specimen: Right Antecubital; Blood  Result Value Ref Range Status   Specimen Description   Final    RIGHT ANTECUBITAL Performed at [facility], [address] [place], [place] [address]    Special Requests   Final    BOTTLES DRAWN AEROBIC AND ANAEROBIC Blood Culture adequate volume Performed at [facility], [address] [place], [place] [address]    Culture   Final    NO GROWTH 5 DAYS Performed at [facility], [address] [place], [place] [address]    Report Status 12/10/2024 FINAL  Final  Blood Culture (routine x 2)     Status: None   Collection Time: 12/05/24  3:15 PM   Specimen: BLOOD RIGHT FOREARM  Result Value Ref Range Status   Specimen Description   Final    BLOOD RIGHT FOREARM Performed at [facility], [address], [place], [place] [address]    Special Requests   Final    BOTTLES DRAWN AEROBIC ONLY BLOOD RIGHT FOREARM Performed at [facility], [address] [place], [place] [address]    Culture   Final    NO GROWTH 5 DAYS Performed at [facility], [address], [place], [place] [address]    Report Status 12/10/2024 FINAL  Final  MRSA Next Gen by PCR, Nasal     Status: Abnormal   Collection Time: 12/05/24 10:08 PM   Specimen: Nasal Mucosa; Nasal Swab  Result Value Ref Range Status   MRSA by PCR Next Gen DETECTED (A) NOT DETECTED Final    Comment: RESULT CALLED TO, READ BACK BY AND VERIFIED WITH: JONETTA LESCHES, RN [phone] 12/06/24 BY ALONSO CORDS (NOTE) The GeneXpert MRSA Assay (FDA approved for NASAL specimens only), is one component of a comprehensive MRSA colonization surveillance program. It is not intended to diagnose MRSA infection nor to guide or monitor treatment for MRSA infections. Test performance is not FDA approved in patients less than 74 years old. Performed at [facility], [address], [place], [place] [address]   C Difficile Quick Screen w PCR reflex     Status: Abnormal   Collection Time: 12/07/24 10:58 AM   Specimen: STOOL  Result Value Ref Range Status   C Diff antigen POSITIVE (A) NEGATIVE Final   C Diff toxin POSITIVE (A) NEGATIVE Final   C Diff interpretation Toxin producing C. difficile detected.  Final    Comment: CRITICAL RESULT CALLED TO, READ BACK BY AND VERIFIED WITH: KATHEE KURTZ, RN ON 12/07/2024 AT 1131 BY SL Performed at [facility], [address], [place], [place] [address]   Gastrointestinal Panel by PCR , Stool     Status: None   Collection Time: 12/07/24 10:58 AM   Specimen: STOOL  Result Value Ref Range Status   Campylobacter species NOT DETECTED NOT DETECTED Final   Plesimonas shigelloides NOT DETECTED NOT DETECTED Final   Salmonella species NOT DETECTED NOT DETECTED Final   Yersinia enterocolitica  NOT DETECTED NOT DETECTED Final   Vibrio species NOT DETECTED NOT DETECTED Final   Vibrio cholerae NOT DETECTED NOT DETECTED Final   Enteroaggregative E coli (EAEC) NOT DETECTED NOT DETECTED Final   Enteropathogenic E coli (EPEC) NOT DETECTED NOT  DETECTED Final   Enterotoxigenic E coli (ETEC) NOT DETECTED NOT DETECTED Final   Shiga like toxin producing E coli (STEC) NOT DETECTED NOT DETECTED Final   Shigella/Enteroinvasive E coli (EIEC) NOT DETECTED NOT DETECTED Final   Cryptosporidium NOT DETECTED NOT DETECTED Final   Cyclospora cayetanensis NOT DETECTED NOT DETECTED Final   Entamoeba histolytica NOT DETECTED NOT DETECTED Final   Giardia lamblia NOT DETECTED NOT DETECTED Final   Adenovirus F40/41 NOT DETECTED NOT DETECTED Final   Astrovirus NOT DETECTED NOT DETECTED Final   Norovirus GI/GII NOT DETECTED NOT DETECTED Final   Rotavirus A NOT DETECTED NOT DETECTED Final   Sapovirus (I, II, IV, and V) NOT DETECTED NOT DETECTED Final    Comment: Performed at [facility], [address] [place], [place] [address]         Radiology Studies: No results found.      Scheduled Meds:  feeding supplement  237 mL Oral BID BM   ferrous sulfate   325 mg Oral QODAY   fidaxomicin   200 mg Oral BID   folic acid   1 mg Oral Daily   sodium chloride  flush  3 mL Intravenous Q12H   sodium chloride  flush  3 mL Intravenous Q12H   sucralfate   1 g Oral TID WC & HS   Continuous Infusions:  famotidine  (PEPCID ) IV 20 mg (12/13/24 0821)     LOS: 8 days    Time spent: 35 minutes    Toribio Hummer, MD [facility]   To contact the attending provider between 7A-7P or the covering provider during after hours 7P-7A, please log into the web site [email or web address] and access using universal [place] password for that web site. If you do not have the password, please call the hospital operator.  12/13/2024, 1:16 PM    "

## 2024-12-13 NOTE — Progress Notes (Signed)
 [facility] Progress Note  SUBJECTIVE:   Interval history: Todd Salazar was seen and evaluated today at bedside.  Resting comfortably in bed.  Having some anterior chest wall pain, not reproducible to palpation, he believes this is related to his pneumonia.  No abdominal pain.  Has not had bowel movement since video capsule was ingested on 12/11/2024.  Video capsule endoscopy did not appear to leave the stomach per review today.  Abdominal x-ray has been ordered.  Past Medical History:  Diagnosis Date   Arthritis    Atrial fibrillation (HCC)    Cancer (HCC)    prostate   Dysrhythmia    afib   History of kidney stones    Past Surgical History:  Procedure Laterality Date   ATRIAL FIBRILLATION ABLATION N/A 10/29/2021   Procedure: ATRIAL FIBRILLATION ABLATION;  Surgeon: Inocencio Soyla Lunger, MD;  Location: [facility] INVASIVE CV LAB;  Service: Cardiovascular;  Laterality: N/A;   ATRIAL FIBRILLATION ABLATION N/A 06/29/2023   Procedure: ATRIAL FIBRILLATION ABLATION;  Surgeon: Inocencio Soyla Lunger, MD;  Location: [facility] INVASIVE CV LAB;  Service: Cardiovascular;  Laterality: N/A;   Bone spur     CARDIOVERSION N/A 06/01/2022   Procedure: CARDIOVERSION;  Surgeon: Kate Lonni CROME, MD;  Location: [facility] ENDOSCOPY;  Service: Cardiovascular;  Laterality: N/A;   CHOLECYSTECTOMY     GIVENS CAPSULE STUDY N/A 12/11/2024   Procedure: IMAGING PROCEDURE, GI TRACT, INTRALUMINAL, VIA CAPSULE;  Surgeon: Kriss Estefana DEL, DO;  Location: [facility] ENDOSCOPY;  Service: Gastroenterology;  Laterality: N/A;   HERNIA REPAIR     JOINT REPLACEMENT     kidney stones     LAMINECTOMY WITH POSTERIOR LATERAL ARTHRODESIS LEVEL 1 Right 11/25/2019   Procedure: Laminectomy and Foraminotomy  Lumbar Four-Lumbar Five - right, with instrumented fusion;  Surgeon: Joshua Alm RAMAN, MD;  Location: [facility] OR;  Service: Neurosurgery;  Laterality: Right;  Laminectomy and Foraminotomy  Lumbar Four-Lumbar Five - right, with instrumented fusion    PROSTATE SURGERY     TEE WITHOUT CARDIOVERSION N/A 06/01/2022   Procedure: TRANSESOPHAGEAL ECHOCARDIOGRAM (TEE);  Surgeon: Kate Lonni CROME, MD;  Location: [facility] ENDOSCOPY;  Service: Cardiovascular;  Laterality: N/A;   Current Facility-Administered Medications  Medication Dose Route Frequency Provider Last Rate Last Admin   acetaminophen  (TYLENOL ) tablet 650 mg  650 mg Oral Q6H PRN Willette Jest A, MD   650 mg at 12/07/24 1045   Or   acetaminophen  (TYLENOL ) suppository 650 mg  650 mg Rectal Q6H PRN Shahmehdi, Jest LABOR, MD       acetaminophen -codeine (TYLENOL  #3) 300-30 MG per tablet 1 tablet  1 tablet Oral Q6H PRN Sebastian Toribio GAILS, MD       alum & mag hydroxide-simeth (MAALOX/MYLANTA) 200-200-20 MG/5ML suspension 30 mL  30 mL Oral Once Thompson, Daniel V, MD       And   lidocaine  (XYLOCAINE ) 2 % viscous mouth solution 15 mL  15 mL Oral Once Thompson, Daniel V, MD       bisacodyl  (DULCOLAX) EC tablet 5 mg  5 mg Oral Daily PRN Shahmehdi, Seyed A, MD   5 mg at 12/13/24 1019   famotidine  (PEPCID ) IVPB 20 mg premix  20 mg Intravenous Daily Ezenduka, Nkeiruka J, MD 100 mL/hr at 12/13/24 0821 20 mg at 12/13/24 0821   feeding supplement (ENSURE PLUS HIGH PROTEIN) liquid 237 mL  237 mL Oral BID BM Ezenduka, Nkeiruka J, MD       ferrous sulfate  tablet 325 mg  325 mg Oral QODAY Shahmehdi, Seyed A,  MD   325 mg at 12/12/24 1029   fidaxomicin  (DIFICID ) tablet 200 mg  200 mg Oral BID Ezenduka, Nkeiruka J, MD   200 mg at 12/13/24 0815   folic acid  (FOLVITE ) tablet 1 mg  1 mg Oral Daily Shahmehdi, Seyed A, MD   1 mg at 12/13/24 9185   hydrALAZINE  (APRESOLINE ) injection 10 mg  10 mg Intravenous Q4H PRN Shahmehdi, Seyed A, MD       HYDROmorphone  (DILAUDID ) injection 0.5 mg  0.5 mg Intravenous Q6H PRN Thompson, Daniel V, MD       ipratropium (ATROVENT ) nebulizer solution 0.5 mg  0.5 mg Nebulization Q6H PRN Shahmehdi, Seyed A, MD       nitroGLYCERIN  (NITROSTAT ) SL tablet 0.4 mg  0.4 mg Sublingual Q5 min  PRN Willette Jest A, MD   0.4 mg at 12/07/24 2104   ondansetron  (ZOFRAN ) tablet 4 mg  4 mg Oral Q6H PRN Shahmehdi, Jest LABOR, MD       Or   ondansetron  (ZOFRAN ) injection 4 mg  4 mg Intravenous Q6H PRN Willette Jest A, MD   4 mg at 12/12/24 1637   Oral care mouth rinse  15 mL Mouth Rinse PRN Ezenduka, Nkeiruka J, MD       senna-docusate (Senokot-S) tablet 1 tablet  1 tablet Oral QHS PRN Shahmehdi, Seyed A, MD       sodium chloride  flush (NS) 0.9 % injection 3 mL  3 mL Intravenous Q12H Shahmehdi, Seyed A, MD   3 mL at 12/13/24 0818   sodium chloride  flush (NS) 0.9 % injection 3 mL  3 mL Intravenous Q12H Shahmehdi, Seyed A, MD   3 mL at 12/13/24 9182   sucralfate  (CARAFATE ) 1 GM/10ML suspension 1 g  1 g Oral TID WC & HS Ezenduka, Nkeiruka J, MD   1 g at 12/08/24 2126   traZODone  (DESYREL ) tablet 25 mg  25 mg Oral QHS PRN Willette Jest A, MD   25 mg at 12/10/24 2112   Allergies as of 12/05/2024 - Review Complete 12/05/2024  Allergen Reaction Noted   Ciprofloxacin Anaphylaxis, Hives, and Other (See Comments) 12/10/2011   Tramadol Swelling and Other (See Comments) 12/10/2011   Duloxetine Other (See Comments) 08/09/2019   Gabapentin  Other (See Comments) 10/23/2019   Hydrocodone-acetaminophen  Nausea And Vomiting 08/09/2019   Nisoldipine Other (See Comments) 08/01/2019   Benadryl [diphenhydramine] Anxiety and Other (See Comments) 08/23/2021   Methocarbamol Nausea Only 10/23/2019   Ondansetron  hcl Nausea Only 10/23/2019   Sulfa antibiotics Rash 12/10/2011   Review of Systems:  Review of Systems  Respiratory:  Negative for shortness of breath.   Cardiovascular:  Positive for chest pain.  Gastrointestinal:  Negative for abdominal pain, nausea and vomiting.    OBJECTIVE:   Temp:  [97.5 F (36.4 C)-98.8 F (37.1 C)] 98.8 F (37.1 C) (01/02 0433) Pulse Rate:  [67-71] 67 (01/02 0433) Resp:  [16-19] 16 (01/02 0433) BP: (123-135)/(65-73) 125/65 (01/02 0433) SpO2:  [94 %-98 %] 94 % (01/02  0433) Weight:  [85.4 kg] 85.4 kg (01/02 0500) Last BM Date : 12/11/24 Physical Exam Constitutional:      General: He is not in acute distress.    Appearance: He is not ill-appearing, toxic-appearing or diaphoretic.  Cardiovascular:     Rate and Rhythm: Normal rate and regular rhythm.  Pulmonary:     Effort: No respiratory distress.     Breath sounds: Normal breath sounds.  Abdominal:     General: Bowel sounds are normal. There is  no distension.     Palpations: Abdomen is soft.     Tenderness: There is no abdominal tenderness. There is no guarding.  Neurological:     Mental Status: He is alert.     Labs: Recent Labs    12/11/24 0845 12/12/24 0438 12/13/24 0410  WBC 3.3* 3.0* 3.5*  HGB 9.6* 8.8* 8.6*  HCT 28.6* 26.8* 25.4*  PLT 156 157 154   BMET Recent Labs    12/11/24 0845 12/12/24 0438 12/13/24 0410  NA 140 142 144  K 4.0 4.1 4.7  CL 113* 113* 115*  CO2 19* 19* 19*  GLUCOSE 89 87 86  BUN 28* 25* 20  CREATININE 2.31* 1.99* 1.65*  CALCIUM  8.2* 8.4* 8.1*   LFT No results for input(s): PROT, ALBUMIN, AST, ALT, ALKPHOS, BILITOT, BILIDIR, IBILI in the last 72 hours. PT/INR No results for input(s): LABPROT, INR in the last 72 hours. Diagnostic imaging: No results found.  IMPRESSION: Acute blood loss anemia Paroxysmal atrial fibrillation, Eliquis  on hold C. difficile on Dificid  Atypical chest pain (question musculoskeletal in origin versus related to community-acquired pneumonia) Concern for esophageal perforation after EGD 11/11/2024, esophagram negative for contrast leak   PLAN: -Video capsule endoscopy read this morning did not appear to leave stomach, check abdominal x-ray -Antibiotic therapy for C. Difficile -Soft GI diet -Dulcolax and Senokot as needed for constipation, monitor bowel movements -No plan for repeat EGD or colonoscopy -Appears stable from GI perspective, can follow-up with his primary GI through [facility] - If remains  inpatient, Dr. Dianna to see tomorrow   LOS: 8 days   Estefana Keas, [facility]

## 2024-12-13 NOTE — Progress Notes (Signed)
 [facility] [address] [facility] Liaison Note:   This patient has been on service with [employer] receiving home health services (SN, PT, OT).   He recently had a change in insurance and we are not currently in network with his [employer] HMO for home health services.   Please call with any questions or concerns. Thank you,   Eleanor Nail, LPN [facility] Liaison [phone]

## 2024-12-13 NOTE — Plan of Care (Signed)

## 2024-12-13 NOTE — TOC Progression Note (Signed)
 Transition of Care [facility]) - Progression Note    Patient Details  Name: Todd Salazar MRN: [id number] Date of Birth: [birth year]  Transition of Care [facility]) CM/SW Contact  Tawni CHRISTELLA Eva, LCSW Phone Number: 12/13/2024, 2:43 PM  Clinical Narrative:     CSW spoke with the pts daughter, who reported that the pt does not have a legal guardian; the legal guardian banner was removed from the pts chart. The pts daughter reported that the pts insurance was switched to a new Medicare Advantage plan and provided CSW with the new member ID. She reported no DME needs and stated that she will provide transportation upon discharge.  CSW explained that the pts insurance must be updated in the chart. CSW discussed the recommendation for home health services and informed her that [facility] can no longer provide home health services. CSW will fax the patients information to a new home health provider and will follow up.  CSW contacted Admitting to request that the pts information be updated in the chart. The pts daughter will need to bring the updated insurance card to be scanned into the chart. ICM to follow.    Expected Discharge Plan: Home w Home Health Services Barriers to Discharge: Continued Medical Work up               Expected Discharge Plan and Services                                               Social Drivers of Health (SDOH) Interventions SDOH Screenings   Food Insecurity: No Food Insecurity (12/05/2024)  Housing: Low Risk (12/05/2024)  Transportation Needs: No Transportation Needs (12/05/2024)  Utilities: Not At Risk (12/05/2024)  Social Connections: Moderately Integrated (12/05/2024)  Tobacco Use: Medium Risk (12/05/2024)    Readmission Risk Interventions    12/01/2024    2:21 PM 07/13/2023   12:34 PM  Readmission Risk Prevention Plan  Post Dischage Appt  Complete  Medication Screening  Complete  Transportation Screening Complete Complete   PCP or Specialist Appt within 5-7 Days Complete   Home Care Screening Complete   Medication Review (RN CM) Complete

## 2024-12-13 NOTE — Plan of Care (Signed)

## 2024-12-14 ENCOUNTER — Inpatient Hospital Stay (HOSPITAL_COMMUNITY)

## 2024-12-14 ENCOUNTER — Other Ambulatory Visit (HOSPITAL_COMMUNITY): Payer: Self-pay

## 2024-12-14 DIAGNOSIS — D539 Nutritional anemia, unspecified: Secondary | ICD-10-CM | POA: Diagnosis not present

## 2024-12-14 DIAGNOSIS — I10 Essential (primary) hypertension: Secondary | ICD-10-CM | POA: Diagnosis not present

## 2024-12-14 DIAGNOSIS — J189 Pneumonia, unspecified organism: Secondary | ICD-10-CM | POA: Diagnosis not present

## 2024-12-14 DIAGNOSIS — I480 Paroxysmal atrial fibrillation: Secondary | ICD-10-CM | POA: Diagnosis not present

## 2024-12-14 DIAGNOSIS — Z7901 Long term (current) use of anticoagulants: Secondary | ICD-10-CM | POA: Diagnosis not present

## 2024-12-14 DIAGNOSIS — N1831 Chronic kidney disease, stage 3a: Secondary | ICD-10-CM | POA: Diagnosis not present

## 2024-12-14 DIAGNOSIS — F419 Anxiety disorder, unspecified: Secondary | ICD-10-CM | POA: Diagnosis not present

## 2024-12-14 DIAGNOSIS — C61 Malignant neoplasm of prostate: Secondary | ICD-10-CM | POA: Diagnosis not present

## 2024-12-14 DIAGNOSIS — R079 Chest pain, unspecified: Secondary | ICD-10-CM | POA: Diagnosis not present

## 2024-12-14 DIAGNOSIS — A0472 Enterocolitis due to Clostridium difficile, not specified as recurrent: Secondary | ICD-10-CM | POA: Diagnosis not present

## 2024-12-14 DIAGNOSIS — I2510 Atherosclerotic heart disease of native coronary artery without angina pectoris: Secondary | ICD-10-CM | POA: Diagnosis not present

## 2024-12-14 LAB — BASIC METABOLIC PANEL WITH GFR
Anion gap: 8 (ref 5–15)
BUN: 18 mg/dL (ref 8–23)
CO2: 21 mmol/L — ABNORMAL LOW (ref 22–32)
Calcium: 8.3 mg/dL — ABNORMAL LOW (ref 8.9–10.3)
Chloride: 116 mmol/L — ABNORMAL HIGH (ref 98–111)
Creatinine, Ser: 1.49 mg/dL — ABNORMAL HIGH (ref 0.61–1.24)
GFR, Estimated: 45 mL/min — ABNORMAL LOW
Glucose, Bld: 90 mg/dL (ref 70–99)
Potassium: 4.4 mmol/L (ref 3.5–5.1)
Sodium: 144 mmol/L (ref 135–145)

## 2024-12-14 LAB — CBC
HCT: 25.8 % — ABNORMAL LOW (ref 39.0–52.0)
Hemoglobin: 8.5 g/dL — ABNORMAL LOW (ref 13.0–17.0)
MCH: 31.4 pg (ref 26.0–34.0)
MCHC: 32.9 g/dL (ref 30.0–36.0)
MCV: 95.2 fL (ref 80.0–100.0)
Platelets: 150 K/uL (ref 150–400)
RBC: 2.71 MIL/uL — ABNORMAL LOW (ref 4.22–5.81)
RDW: 23.1 % — ABNORMAL HIGH (ref 11.5–15.5)
WBC: 3.3 K/uL — ABNORMAL LOW (ref 4.0–10.5)
nRBC: 0 % (ref 0.0–0.2)

## 2024-12-14 LAB — GLUCOSE, CAPILLARY: Glucose-Capillary: 97 mg/dL (ref 70–99)

## 2024-12-14 MED ORDER — FIDAXOMICIN 200 MG PO TABS
200.0000 mg | ORAL_TABLET | Freq: Two times a day (BID) | ORAL | 0 refills | Status: DC
  Filled 2024-12-14: qty 6, 3d supply, fill #0

## 2024-12-14 MED ORDER — VANCOMYCIN HCL 125 MG PO CAPS
125.0000 mg | ORAL_CAPSULE | Freq: Four times a day (QID) | ORAL | 0 refills | Status: AC
  Filled 2024-12-14: qty 12, 3d supply, fill #0

## 2024-12-14 MED ORDER — SENNOSIDES-DOCUSATE SODIUM 8.6-50 MG PO TABS: 1.0000 | ORAL_TABLET | Freq: Every evening | ORAL | Status: AC | PRN

## 2024-12-14 MED ORDER — ONDANSETRON HCL 4 MG PO TABS
4.0000 mg | ORAL_TABLET | Freq: Four times a day (QID) | ORAL | 0 refills | Status: DC | PRN
  Filled 2024-12-14: qty 20, 5d supply, fill #0

## 2024-12-14 MED ORDER — SENNOSIDES-DOCUSATE SODIUM 8.6-50 MG PO TABS: 1.0000 | ORAL_TABLET | Freq: Every day | ORAL | Status: DC

## 2024-12-14 MED ORDER — FAMOTIDINE 20 MG PO TABS
20.0000 mg | ORAL_TABLET | Freq: Every day | ORAL | 0 refills | Status: DC
  Filled 2024-12-14: qty 90, 90d supply, fill #0

## 2024-12-14 MED ORDER — ONDANSETRON HCL 4 MG PO TABS
4.0000 mg | ORAL_TABLET | Freq: Four times a day (QID) | ORAL | 0 refills | Status: AC | PRN
  Filled 2024-12-14: qty 20, 5d supply, fill #0

## 2024-12-14 MED ORDER — SUCRALFATE 1 GM/10ML PO SUSP
1.0000 g | Freq: Three times a day (TID) | ORAL | 0 refills | Status: DC
  Filled 2024-12-14: qty 560, 14d supply, fill #0

## 2024-12-14 MED ORDER — SUCRALFATE 1 GM/10ML PO SUSP: 1.0000 g | Freq: Three times a day (TID) | ORAL | 0 refills | Status: DC

## 2024-12-14 MED ORDER — VANCOMYCIN HCL 125 MG PO CAPS: 125.0000 mg | ORAL_CAPSULE | Freq: Four times a day (QID) | ORAL | 0 refills | Status: DC

## 2024-12-14 MED ORDER — ONDANSETRON HCL 4 MG PO TABS: 4.0000 mg | ORAL_TABLET | Freq: Four times a day (QID) | ORAL | 0 refills | Status: DC | PRN

## 2024-12-14 MED ORDER — ENSURE PLUS HIGH PROTEIN PO LIQD: 237.0000 mL | Freq: Two times a day (BID) | ORAL | Status: AC

## 2024-12-14 MED ORDER — FAMOTIDINE 20 MG PO TABS
20.0000 mg | ORAL_TABLET | Freq: Every day | ORAL | 0 refills | Status: AC
  Filled 2024-12-14: qty 90, 90d supply, fill #0

## 2024-12-14 MED ORDER — FAMOTIDINE 20 MG PO TABS: 20.0000 mg | ORAL_TABLET | Freq: Every day | ORAL | 0 refills | Status: DC

## 2024-12-14 MED ORDER — ACETAMINOPHEN 325 MG PO TABS: 650.0000 mg | ORAL_TABLET | Freq: Four times a day (QID) | ORAL | Status: AC | PRN

## 2024-12-14 MED ORDER — APIXABAN 5 MG PO TABS: 5.0000 mg | ORAL_TABLET | Freq: Two times a day (BID) | ORAL | Status: AC

## 2024-12-14 NOTE — Progress Notes (Signed)
 Discharge meds sent to [facility], meds ready at 1624. Dificid  cost was approximately $475 with insurance. Carafate  suspension was $60. Secure chat sent to Dr Sebastian  & to Damien, RN regarding costs of medicine.   Per primary RN, patient is refusing Carafate  because it makes him nauseated, cancelled by Dr Sebastian. Per Dr Renato, patient will be discharged with Vancomycin  instead of Dificid . Patient will receive last dose of Dificid  here tonight prior to discharge to home. Discharge meds will be resent to [facility] since patient's pharmacy of choice ([facility]) is closed tomorrow. Family & patient made aware verbally that patient will be on Vancomycin  starting tomorrow and that pharmacy hours on Sunday are from 1000 - 1400.

## 2024-12-14 NOTE — TOC Transition Note (Signed)
 Transition of Care [facility]) - Discharge Note   Patient Details  Name: Todd Salazar MRN: [id number] Date of Birth: January 13, 1936  Transition of Care [facility]) CM/SW Contact:  Sonda Manuella Quill, RN Phone Number: 12/14/2024, 4:33 PM   Clinical Narrative:    Orders received for HHPT/OT/Aide; spoke w/ pt's dtr Todd Salazar [phone]) ato verify insurance; she said pt has [employer], subscriber # [phone]; d/c address verified: [address] [place], [place] [address]; referral given to Burnadette Idol at [facility]; she said agency can provide services; agency contact info placed in follow up provider section of d/c instructions; no IP CM needs.    Final next level of care: Home w Home Health Services Barriers to Discharge: No Barriers Identified   Patient Goals and CMS Choice            Discharge Placement                       Discharge Plan and Services Additional resources added to the After Visit Summary for                            [facility] Arranged: PT, OT, Nurse's Aide HH Agency: [facility] Date [facility] Agency Contacted: 12/14/24 Time HH Agency Contacted: 1633 Representative spoke with at [facility] Agency: Burnadette Signs  Social Drivers of Health (SDOH) Interventions SDOH Screenings   Food Insecurity: No Food Insecurity (12/05/2024)  Housing: Low Risk (12/05/2024)  Transportation Needs: No Transportation Needs (12/05/2024)  Utilities: Not At Risk (12/05/2024)  Social Connections: Moderately Integrated (12/05/2024)  Tobacco Use: Medium Risk (12/05/2024)     Readmission Risk Interventions    12/01/2024    2:21 PM 07/13/2023   12:34 PM  Readmission Risk Prevention Plan  Post Dischage Appt  Complete  Medication Screening  Complete  Transportation Screening Complete Complete  PCP or Specialist Appt within 5-7 Days Complete   Home Care Screening Complete   Medication Review (RN CM) Complete

## 2024-12-14 NOTE — Progress Notes (Addendum)
 Physical Therapy Treatment Patient Details Name: Todd Salazar MRN: [id number] DOB: Apr 01, 1936 Today's Date: 12/14/2024   History of Present Illness 89 yo male admitted with chest pain, possible Pna. Hx of CAD, CKD, restrictive lung disease, colon Ca, Afib, RLS, pacemaker, prostate Ca    PT Comments  Pt supine in bed, agreeable to therapy. Pt amb 140 ft with RW, step through gait pattern, trunk slightly forward flexed, able to complete turns and navigate around obstacles without LOB. Upon return to room, pt agreeable to exercises, positioned in long sitting in recliner. Pt tolerates heel slides and hip abd/add then reports chest pain/tightness; RN into room to further evaluate. Pt on RA with SpO2 100% post amb and during exercises, HR 70s-80s with amb and exercise. Continue to recommend HHPT with family support at d/c.   If plan is discharge home, recommend the following: A little help with walking and/or transfers;A little help with bathing/dressing/bathroom;Assistance with cooking/housework;Assist for transportation   Can travel by private vehicle        Equipment Recommendations  None recommended by PT    Recommendations for Other Services       Precautions / Restrictions Precautions Precautions: Fall Recall of Precautions/Restrictions: Intact Restrictions Weight Bearing Restrictions Per Provider Order: No     Mobility  Bed Mobility Overal bed mobility: Modified Independent                  Transfers Overall transfer level: Modified independent Equipment used: Rolling walker (2 wheels) Transfers: Sit to/from Stand                  Ambulation/Gait Ambulation/Gait assistance: Supervision Gait Distance (Feet): 140 Feet Assistive device: Rolling walker (2 wheels) Gait Pattern/deviations: Step-through pattern, Decreased stride length           Stairs             Wheelchair Mobility     Tilt Bed    Modified Rankin (Stroke Patients Only)        Balance Overall balance assessment: Needs assistance Sitting-balance support: Feet supported Sitting balance-Leahy Scale: Good     Standing balance support: Bilateral upper extremity supported, During functional activity, Reliant on assistive device for balance Standing balance-Leahy Scale: Fair Standing balance comment: static no UE support, dynamic using RW                            Communication Communication Communication: No apparent difficulties  Cognition Arousal: Alert Behavior During Therapy: WFL for tasks assessed/performed   PT - Cognitive impairments: No apparent impairments                         Following commands: Intact      Cueing Cueing Techniques: Verbal cues  Exercises General Exercises - Lower Extremity Heel Slides: AROM, Both, 10 reps, Seated Hip ABduction/ADduction: AROM, Both, 10 reps, Seated Straight Leg Raises: AROM, Both, 10 reps, Seated    General Comments        Pertinent Vitals/Pain Pain Assessment Pain Assessment: 0-10 Pain Score: 5  Pain Location: chest pain (chest pain denied at beginning of session, wtih ambulation and with heel slides, pt reports chest pain after hip abd/add while long sitting in recliner and RN into room) Pain Descriptors / Indicators: Tightness Pain Intervention(s): Limited activity within patient's tolerance, Monitored during session, Repositioned (RN into room)    Home Living  Prior Function            PT Goals (current goals can now be found in the care plan section) Progress towards PT goals: Progressing toward goals    Frequency    Min 3X/week      PT Plan      Co-evaluation              AM-PAC PT 6 Clicks Mobility   Outcome Measure  Help needed turning from your back to your side while in a flat bed without using bedrails?: None Help needed moving from lying on your back to sitting on the side of a flat bed without  using bedrails?: None Help needed moving to and from a bed to a chair (including a wheelchair)?: None Help needed standing up from a chair using your arms (e.g., wheelchair or bedside chair)?: None Help needed to walk in hospital room?: A Little Help needed climbing 3-5 steps with a railing? : A Little 6 Click Score: 22    End of Session Equipment Utilized During Treatment: Gait belt Activity Tolerance: Patient tolerated treatment well Patient left: in chair;with call bell/phone within reach;with chair alarm set;with nursing/sitter in room Nurse Communication: Mobility status PT Visit Diagnosis: Unsteadiness on feet (R26.81);Other abnormalities of gait and mobility (R26.89);Muscle weakness (generalized) (M62.81)     Time: 8944-8880 PT Time Calculation (min) (ACUTE ONLY): 24 min  Charges:    $Gait Training: 8-22 mins $Therapeutic Exercise: 8-22 mins PT General Charges $$ ACUTE PT VISIT: 1 Visit                     Tori Bralee Feldt PT, DPT 12/14/2024, 11:22 AM

## 2024-12-14 NOTE — Progress Notes (Addendum)
 [facility] Gastroenterology Progress Note  Madsen Riddle 89 y.o. 30-Apr-1936   Subjective: Sitting in bedside chair. Denies abdominal pain. BM this morning.  Objective: Vital signs: Vitals:   12/14/24 0416 12/14/24 0612  BP: 136/69   Pulse: 64   Resp: 18   Temp: 99.6 F (37.6 C)   SpO2: 97% 99%    Physical Exam: Gen: alert, no acute distress, elderly, pleasant  HEENT: anicteric sclera CV: RRR Chest: CTA B Abd: soft, nontender, nondistended, +BS Ext: no edema  Lab Results: Recent Labs    12/13/24 0410 12/14/24 0428  NA 144 144  K 4.7 4.4  CL 115* 116*  CO2 19* 21*  GLUCOSE 86 90  BUN 20 18  CREATININE 1.65* 1.49*  CALCIUM  8.1* 8.3*   No results for input(s): AST, ALT, ALKPHOS, BILITOT, PROT, ALBUMIN in the last 72 hours. Recent Labs    12/12/24 0438 12/13/24 0410 12/14/24 0428  WBC 3.0* 3.5* 3.3*  NEUTROABS 1.3*  --   --   HGB 8.8* 8.6* 8.5*  HCT 26.8* 25.4* 25.8*  MCV 94.4 91.7 95.2  PLT 157 154 150      Assessment/Plan: Obscure occult GI bleed - unsuccessful capsule endoscopy (remained in stomach). Xray yesterday shows capsule in colon. Recheck Xray today since pt had BM to look for the capsule. Hgb stable. C. Diff on Dificid . No plans for colonoscopy. Ok to resume Eliquis  in 2 days from GI standpoint Will sign off. F/U with [facility].    Jerrell JAYSON Sol 12/14/2024, 8:54 AM  Questions please call [phone] ID: Rohith Fauth, male   DOB: August 23, 1936, 89 y.o.   MRN: [id number]

## 2024-12-14 NOTE — Plan of Care (Signed)
" °  Problem: Nutrition: Goal: Adequate nutrition will be maintained Outcome: Not Progressing   Problem: Clinical Measurements: Goal: Ability to maintain clinical measurements within normal limits will improve Outcome: Progressing Goal: Will remain free from infection Outcome: Progressing Goal: Diagnostic test results will improve Outcome: Progressing Goal: Respiratory complications will improve Outcome: Progressing Goal: Cardiovascular complication will be avoided Outcome: Progressing   "

## 2024-12-14 NOTE — Discharge Summary (Addendum)
 Physician Discharge Summary  Todd Salazar FMW:[id number] DOB: 12/25/1935 DOA: 12/05/2024  PCP: Patient, No Pcp Per  Admit date: 12/05/2024 Discharge date: 12/14/2024  Time spent: 60 minutes  Recommendations for Outpatient Follow-up:  Follow-up with PCP in 2 weeks.  On follow-up patient need a CBC done to follow-up on counts.  Patient will need to be met done to follow-up on electrolytes and renal function. Follow-up with Todd Nakai, PA in 1 to 2 weeks.  On follow-up patient will need a CBC done and further evaluation of anemia and concern for GI bleed.  Patient had a unsuccessful capsule endoscopy during his hospitalization.  GI recommending outpatient follow-up. Follow-up with Todd Salazar, cardiology in 2 weeks. Follow-up with Todd Salazar, nephrology in 1 to 2 weeks.   Discharge Diagnoses:  Principal Problem:   Chest pain Active Problems:   CAD (coronary artery disease), native coronary artery   Carcinoma of prostate (HCC)   CAP (community acquired pneumonia)   Macrocytic anemia   Anxiety   Current use of long term anticoagulation   Acute respiratory failure with hypoxia (HCC)   Elevated brain natriuretic peptide (BNP) level   HTN (hypertension)   PAF (paroxysmal atrial fibrillation) (HCC)   CKD-3A   Pacemaker   Atrial tachycardia   C. difficile colitis   Discharge Condition: Stable and improved.  Diet recommendation: Regular  Filed Weights   12/11/24 0500 12/13/24 0500 12/14/24 0500  Weight: 83.1 kg 85.4 kg 82.2 kg    History of present illness:  HPI per Dr. Willette Marcey Salazar is a 89 y.o. male with medical history significant of anemia CAD A-fib (has been off Eliquis  secondary to GI bleed), GI bleed, colon cancer, HLD, RLS, prostate cancer, IDA, restrictive lung disease, bronchiectasis, CKD stage III .Todd Salazar  Presenting with persistent chest pain, tightness addressed gets worse with exertion.  This also associated with shortness of breath.   Reporting of  persistent chest pain, tightness, since Saturday mainly on left side does not radiate, no nausea no vomiting History of pericarditis in 2024 also known history of CAD. Was recently admitted and treated for pneumonia chest pain.  Was discharged on 12/21 He has completed his antibiotic course.     ED Evaluation: Blood pressure 115/77, pulse (!) 109, temperature 97.8 F (36.6 C), temperature source Oral, resp. rate 16, SpO2 97%. LABs: WBC 5.6, hemoglobin 8.9, INR 1.6, CO2 20, total bili 1.6,  BNP 4,689.0, troponin 30        Patient Denies having: Fever, Chills, Cough, SOB, Chest Pain, Abd pain, N/V/D, headache, dizziness, lightheadedness,  Dysuria, Joint pain, rash, open wounds   ED Course:   Blood pressure 115/77, pulse (!) 109, temperature 97.8 F (36.6 C), temperature source Oral, resp. rate 16, SpO2 97%. Abnormal labs;   Hospital Course:  #1 chest pain/history of CAD/??  Recurrent pneumonia/?/Pericarditis/noted restrictive lung disease -Patient with complaints of chest pain on presentation unknown etiology unlikely felt to be ACS. - Patient afebrile with resolution of leukocytosis. - Troponins minimally elevated but flattened. - EKG with no ischemic changes noted. - 2D echo done 12/01/2024 with EF of 60 to 65%, no regional wall motion abnormalities, right ventricular systolic function normal. - BNP noted to be elevated 500>>> 4689. - LDL noted at 38. - Chest x-ray negative for volume overload with concern for persistent pneumonia. - CRP 5.2, ESR 22. - Procalcitonin, lactic acid within normal limits. - CT chest abdomen and pelvis showed stable small focal airspace consolidation in the posterior right lower lobe,  questionable focal wall thickening in the mid ascending colon without acute inflammatory stranding.  Diffuse colonic diverticulosis. - Patient seen in consultation by cardiology who signed off.   - Patient given GI cocktail with clinical improvement.   - Patient started on  Pepcid  20 mg daily which she will be discharged on.   - Outpatient follow-up with PCP.   2.  C. difficile colitis/intractable nausea and vomiting-improving -Patient noted with complaints of diarrhea, stool studies positive for C. difficile. - CT abdomen and pelvis showed questionable focal wall thickening in the mid descending colon without acute inflammatory stranding.  Diffuse colonic diverticulosis.  Consider colonoscopy for further evaluation once fully recovered.  Also noted bilateral renal cysts, largest in the left kidney measuring 4.1 cm with numerous punctuate left renal calculi. - Patient improved clinically.   - Patient maintained on Dificid  and will be discharged on 3 more days to complete a 10-day course of treatment. -Patient was discharged in stable and improved condition.   3.  AKI on CKD stage III/ATN possibly from C. difficile colitis/hypotension/?  GI bleed - Baseline creatinine approximately 1.1-1.3. - Creatinine noted to have gone up as high as 3.55. - CT abdomen and pelvis showed bilateral renal cysts, numerous punctuate left renal calculi. - Renal ultrasound with bilateral echogenic kidneys, bilateral simple renal cysts. - Patient hydrated gently with IV fluids with renal function improving with creatinine at 1.49 by day of discharge.   - Patient was seen in consultation by nephrology who have signed off. -Outpatient follow-up with nephrology 1 to 2 weeks postdischarge.   4.  Normocytic anemia/anemia of chronic disease/folate deficiency/history of GI bleed-recent EGD with [facility] on 11/11/2024 showed gastritis - Patient noted to have a hemoglobin drop from 7.5-6.7. - FOBT positive. - GI consulted and recommended capsule endoscopy which was unsuccessful as capsule remained in the stomach.  X-ray showed capsule in the colon.  Repeat x-ray on day of discharge with capsule in the transverse colon.   - Anemia panel done 11/30/2024 with iron of 43, ferritin greater  than 8000, folate of 4, vitamin B12 greater than 1000. - Status post transfusion 2 units PRBC 12/10/2024. - Patient maintained on folic acid . - Eliquis  was held during the hospitalization and per GI could be resumed 2 days postdischarge.  - Patient cleared by GI for discharge with outpatient follow-up with primary cardiologist, at [facility].  - Patient will be discharged in stable and improved condition.    5.  Hypertension -Stable. - Metoprolol  discontinued, amiodarone  held. - Outpatient follow-up..   6.  Persistent A-fib/SSS status post PPM/history of atrial tachycardia/PVCs/atrial tachycardia/history of ablation -Patient remained rate controlled during the hospitalization. - Cardiology was following the patient during the hospitalization and okay with holding amiodarone , if A-fib with RVR occur again may restart IV amiodarone . - Eliquis  was held during the hospitalization due to GI bleed.  - Patient noted to have failed sotalol , flecainide , Tikosyn , propafenone and also not tolerated AV nodal blocking agents. -GI recommended resumption of Eliquis  2 days postdischarge. - Outpatient follow-up with cardiology.   7.  Carcinoma of the prostate -In remission. - Outpatient follow-up.   8.  Pressure injury perineum medial stage II, POA Wound 12/06/24 1323 Pressure Injury Perineum Medial Stage 2 -  Partial thickness loss of dermis presenting as a shallow open injury with a red, pink wound bed without slough. (Active)       Procedures: Abdominal films 12/08/2024, 12/13/2024 Chest x-ray 12/05/2024 Renal ultrasound 12/08/2024 CT chest  abdomen pelvis 12/06/2024    Consultations: Gastroenterology: Dr. Kriss 12/10/2024 ID: Dr. Overton 12/08/2024 Nephrology: Dr. Melia 12/08/2024 Cardiology: Dr. Delford 12/06/2024    Discharge Exam: Vitals:   12/14/24 0612 12/14/24 1329  BP:  136/66  Pulse:  66  Resp:  18  Temp:  98.7 F (37.1 C)  SpO2: 99% 97%    General: NAD Cardiovascular:  Regular rate rhythm no murmurs rubs or gallops.  No JVD.  No lower extremity edema. Respiratory: Clear to auscultation bilaterally.  No wheezes, no crackles, no rhonchi.  Fair air movement.  Speaking in full sentences.  Discharge Instructions   Discharge Instructions     Diet general   Complete by: As directed    Increase activity slowly   Complete by: As directed    No wound care   Complete by: As directed       Allergies as of 12/14/2024       Reactions   Ciprofloxacin Anaphylaxis, Hives, Other (See Comments)   Dizziness, also   Tramadol Swelling, Other (See Comments)   Laryngeal edema   Duloxetine Other (See Comments)   Delusions   Gabapentin  Other (See Comments)   Affects eyesight and makes the patient very unsteady   Hydrocodone-acetaminophen  Nausea And Vomiting   Nisoldipine Other (See Comments)   Pt unsure of reaction (Sular, for hypertension)   Benadryl [diphenhydramine] Anxiety, Other (See Comments)   Panic attack    Methocarbamol Nausea Only   Ondansetron  Hcl Nausea Only   Sulfa Antibiotics Rash        Medication List     PAUSE taking these medications    apixaban  5 MG Tabs tablet Wait to take this until: December 16, 2024 Commonly known as: Eliquis  Take 1 tablet (5 mg total) by mouth 2 (two) times daily.       STOP taking these medications    azithromycin  500 MG tablet Commonly known as: Zithromax    cefdinir  300 MG capsule Commonly known as: OMNICEF        TAKE these medications    acetaminophen  325 MG tablet Commonly known as: TYLENOL  Take 2 tablets (650 mg total) by mouth every 6 (six) hours as needed for mild pain (pain score 1-3) (or Fever >/= 101).   famotidine  20 MG tablet Commonly known as: PEPCID  Take 1 tablet (20 mg total) by mouth daily. Start taking on: December 15, 2024   feeding supplement Liqd Take 237 mLs by mouth 2 (two) times daily between meals. Start taking on: December 15, 2024   ferrous sulfate  325 (65 FE) MG  tablet Take 325 mg by mouth every other day.   folic acid  1 MG tablet Commonly known as: FOLVITE  Take 1 tablet (1 mg total) by mouth daily.   metoprolol  tartrate 25 MG tablet Commonly known as: LOPRESSOR  Take 1 tablet (25 mg total) by mouth daily as needed (PALPITATIONS LSTING LONGER THATN 30 MINUTES).   nitroGLYCERIN  0.4 MG SL tablet Commonly known as: NITROSTAT  Place 1 tablet (0.4 mg total) under the tongue every 5 (five) minutes as needed for chest pain.   ondansetron  4 MG tablet Commonly known as: ZOFRAN  Take 1 tablet (4 mg total) by mouth every 6 (six) hours as needed for nausea.   senna-docusate 8.6-50 MG tablet Commonly known as: Senokot-S Take 1 tablet by mouth at bedtime as needed for mild constipation.   sucralfate  1 GM/10ML suspension Commonly known as: CARAFATE  Take 10 mLs (1 g total) by mouth 4 (four) times daily -  with meals  and at bedtime.   vancomycin  125 MG capsule Commonly known as: VANCOCIN  Take 1 capsule (125 mg total) by mouth 4 (four) times daily for 3 days.       Allergies[1]  Contact information for follow-up providers     PCP. Schedule an appointment as soon as possible for a visit in 2 week(s).          Candi Bruckner, PA-C. Schedule an appointment as soon as possible for a visit in 2 week(s).   Specialty: Gastroenterology Why: Follow-up in 1 to 2 weeks. Contact information: [address] [place] [address] [phone]         Salazar Soyla Lunger, MD. Schedule an appointment as soon as possible for a visit in 2 week(s).   Specialty: Cardiology Contact information: [address] [place] [address] [phone]         Salazar Katheryn BROCKS, MD Follow up in 2 week(s).   Specialty: Nephrology Why: Follow-up in 1 to 2 weeks. Contact information: [address] [place] [address] [phone]              Contact information for after-discharge care     Home Medical Care     [facility]) .   Service: Home Health Services Contact information: [address] [place]  [address] [phone]                      The results of significant diagnostics from this hospitalization (including imaging, microbiology, ancillary and laboratory) are listed below for reference.    Significant Diagnostic Studies: DG Abd 1 View Result Date: 12/14/2024 EXAM: 1 VIEW XRAY OF THE ABDOMEN 12/14/2024 09:53:00 AM COMPARISON: 12/13/2024 CLINICAL HISTORY: Foreign body in colon FINDINGS: BOWEL: Endoscopy capsule projects over the left upper quadrant. Favored to be in the transverse colon given position on 12/13/2024. Nonobstructive bowel gas pattern. SOFT TISSUES: No abnormal calcifications. BONES: Bilateral hip arthroplasties noted. Postoperative changes lumbar spine. No acute fracture. LUNGS: Stable bibasilar pulmonary opacities. IMPRESSION: 1. Endoscopy capsule projects over the left upper quadrant, favored to be in the transverse colon. Electronically signed by: Norman Gatlin MD 12/14/2024 10:07 AM EST RP Workstation: [id number]   DG Abd 1 View Result Date: 12/13/2024 EXAM: 1 VIEW XRAY OF THE ABDOMEN 12/13/2024 09:05:00 AM COMPARISON: 12/08/2024 CLINICAL HISTORY: Retained foreign body FINDINGS: LINES, TUBES AND DEVICES: Endoscopy capsule in right hemipelvis. Cholecystectomy clips noted. Bilateral hip arthroplasty in place. Lumbar spinal fixation in place. Penile prosthesis in place. BOWEL: Nonobstructive bowel gas pattern. SOFT TISSUES: No abnormal calcifications. BONES: No acute fracture. LUNGS: Patchy bibasilar pulmonary opacities. IMPRESSION: 1. Radiodense likely endoscopy capsule projects over the right hemipelvis, possibly projecting over the cecum or sigmoid colon. 2. Bibasilar pulmonary opacities. Electronically signed by: Michaeline Blanch MD 12/13/2024 01:48 PM EST RP Workstation: [id number]   US  RENAL Result Date: 12/08/2024 CLINICAL  DATA:  Acute kidney injury, renal cysts. EXAM: RENAL / URINARY TRACT ULTRASOUND COMPLETE COMPARISON:  None Available. FINDINGS: Right Kidney: Renal measurements: 10.1 cm x 4.3 cm x 5.4 cm = volume: 121.8 mL. There is diffusely increased echogenicity of the renal parenchyma. A 1.1 cm x 1.0 cm x 1.3 cm simple cyst is seen within the superolateral aspect of the right kidney. No hydronephrosis is visualized. Left Kidney: Renal measurements: 11.5 cm x 4.9 cm x 4.9 cm = volume: 142.4 mL. There is diffusely increased echogenicity of the renal parenchyma. 2.2 cm  x 2.2 cm x 2.7 cm and 4.1 cm x 4.3 cm x 4.3 cm simple cysts are seen within the left kidney no hydronephrosis is visualized. Bladder: Appears normal for degree of bladder distention. An adjacent penile pump reservoir is noted. Other: None. IMPRESSION: 1. Bilateral echogenic kidneys which likely represents sequelae associated with medical renal disease. 2. Bilateral simple renal cysts. Electronically Signed   By: Suzen Dials M.D.   On: 12/08/2024 11:28   DG Abd Portable 1V Result Date: 12/08/2024 CLINICAL DATA:  Abdominal pain.  Nausea and vomiting. EXAM: PORTABLE ABDOMEN - 1 VIEW COMPARISON:  None Available. FINDINGS: The bowel gas pattern is normal. No radio-opaque calculi or other significant radiographic abnormality are seen. Right upper quadrant surgical clips, lumbar spine fusion hardware and bilateral hip prostheses noted. IMPRESSION: Unremarkable bowel gas pattern.  No acute findings. Electronically Signed   By: Norleen DELENA Kil M.D.   On: 12/08/2024 09:40   CT CHEST ABDOMEN PELVIS WO CONTRAST Result Date: 12/07/2024 EXAM: CT CHEST, ABDOMEN AND PELVIS WITHOUT CONTRAST 12/06/2024 01:13:00 PM TECHNIQUE: CT of the chest, abdomen and pelvis was performed without the administration of intravenous contrast. Multiplanar reformatted images are provided for review. Automated exposure control, iterative reconstruction, and/or weight based adjustment of the  mA/kV was utilized to reduce the radiation dose to as low as reasonably achievable. COMPARISON: CT angiogram chest 12/02/2024. CT abdomen and pelvis 04/22/2022. CLINICAL HISTORY: Persistent chest pain, intractable nausea and vomiting. Recent pneumonia, now with leukocytosis. FINDINGS: CHEST: MEDIASTINUM AND LYMPH NODES: Pacemaker in place. Heart and pericardium are unremarkable. The central airways are clear. No mediastinal, hilar or axillary lymphadenopathy. LUNGS AND PLEURA: There is scarring in both lung apices. Small focal air of airspace consolidation in the posterior right lower lobe is unchanged. 4 mm fissural nodules in the left lower lobe are likely intrapulmonary lymph nodes and calcified, unchanged from prior. There is minimal atelectasis in the left lung base. There is a 3 mm right middle lobe nodule image 4/111 which is stable from prior. No pleural effusion or pneumothorax. ABDOMEN AND PELVIS: LIVER: The liver is unremarkable. GALLBLADDER AND BILE DUCTS: The gallbladder is surgically absent. No biliary ductal dilatation. SPLEEN: No acute abnormality. PANCREAS: No acute abnormality. ADRENAL GLANDS: No acute abnormality. KIDNEYS, URETERS AND BLADDER: Bilateral renal cysts are present. The largest is in the inferior pole of the left kidney measuring 4.1 cm. There are numerous punctate left renal calculi. There is likely duplicated left renal collecting system. No stones in the right kidney or ureters. No hydronephrosis. No perinephric or periureteral stranding. Urinary bladder is unremarkable. GI AND BOWEL: Stomach demonstrates no acute abnormality. There is diffuse colonic diverticulosis, severe in the sigmoid colon. There are some questionable focal wall thickening in the mid ascending colon. No acute inflammatory stranding. The appendix appears normal. There is no bowel obstruction. REPRODUCTIVE ORGANS: The prostate gland is small or absent. Penile implant/pump present. PERITONEUM AND RETROPERITONEUM:  No ascites. No free air. VASCULATURE: Aorta is normal in caliber. There are atherosclerotic calcifications of the aorta and iliac arteries. ABDOMINAL AND PELVIS LYMPH NODES: No lymphadenopathy. BONES AND SOFT TISSUES: L3-L5 posterior fusion hardware present. Bilateral hip arthroplasties are present. No acute osseous abnormality. No focal soft tissue abnormality. IMPRESSION: 1. Stable small focal airspace consolidation in the posterior right lower lobe. 2. Questionable focal wall thickening in the mid ascending colon without acute inflammatory stranding; consider colonoscopy for further evaluation. 3. Stable 3 mm right middle lobe nodule. 4. Diffuse colonic diverticulosis, severe in the  sigmoid colon. 5. Bilateral renal cysts, largest in the left kidney measuring 4.1 cm. 6. Numerous punctate left renal calculi. Electronically signed by: Greig Pique MD 12/07/2024 01:06 AM EST RP Workstation: [id number]   CUP PACEART REMOTE DEVICE CHECK Result Date: 12/06/2024 PPM Scheduled remote reviewed. Normal device function.  Presenting rhythm: AFL-VS, tachy. Several AHR, longest 17.5 hours, EGMs consistent with AFL. 1 VHR lasting 3 minutes, atrial driven. Recent increase in AF burden up to 29% this week, with current episode in progress since 12/24.  History of AF/AFL, on Eliquis  per Epic.  Monitor for persistence per protocol. Next remote transmission per protocol. - CS, CVRS  DG Chest Portable 1 View Result Date: 12/05/2024 EXAM: 1 VIEW(S) XRAY OF THE CHEST 12/05/2024 02:28:00 PM COMPARISON: CT Angio Chest dated 11/30/2024,  CT angio chest 05/30/22, cxr 07/10/23. CLINICAL HISTORY: CP. FINDINGS: LINES, TUBES AND DEVICES: Overlying wires noted. Left chest dual-chamber pacemaker with leads projecting over right atrium and ventricle. LUNGS AND PLEURA: Persistent right hilar consolidation. No pleural effusion. No pneumothorax. HEART AND MEDIASTINUM: No acute abnormality of the cardiac and mediastinal silhouettes. BONES AND  SOFT TISSUES: No acute osseous abnormality. IMPRESSION: 1. Persistent right hilar consolidation that correlates with hilar lymph nodes/soft tissues density on prior CT angio chests. Electronically signed by: Morgane Naveau MD 12/05/2024 02:59 PM EST RP Workstation: [id number]   ECHOCARDIOGRAM LIMITED Result Date: 12/01/2024    ECHOCARDIOGRAM LIMITED REPORT   Patient Name:   Todd Salazar Date of Exam: 12/01/2024 Medical Rec #:  [id number]    Height:       75.0 in Accession #:    [id number]   Weight:       175.9 lb Date of Birth:  Feb 26, 1936    BSA:          2.078 m Patient Age:    88 years     BP:           102/62 mmHg Patient Gender: M            HR:           69 bpm. Exam Location:  Inpatient Procedure: 2D Echo and Limited Echo (Both Spectral and Color Flow Doppler were            utilized during procedure). Indications:    Elevated Troponin  History:        Patient has prior history of Echocardiogram examinations, most                 recent 10/11/2024. Risk Factors:Hypertension.  Sonographer:    Nathanel Devonshire Referring Phys: [id number] ANASTASSIA DOUTOVA IMPRESSIONS  1. Left ventricular ejection fraction, by estimation, is 60 to 65%. The left ventricle has normal function. The left ventricle has no regional wall motion abnormalities.  2. Right ventricular systolic function is normal. The right ventricular size is mildly enlarged.  3. The mitral valve is normal in structure. No evidence of mitral valve regurgitation.  4. The aortic valve is tricuspid. Aortic valve regurgitation is not visualized. Comparison(s): Changes from prior study are noted. LV size has normalized and EF is similar. FINDINGS  Left Ventricle: Left ventricular ejection fraction, by estimation, is 60 to 65%. The left ventricle has normal function. The left ventricle has no regional wall motion abnormalities. Right Ventricle: The right ventricular size is mildly enlarged. Right ventricular systolic function is normal. Mitral Valve: The mitral valve is  normal in structure. Tricuspid Valve: The tricuspid valve is not well visualized. Aortic Valve: The aortic valve  is tricuspid. Aortic valve regurgitation is not visualized. Pulmonic Valve: The pulmonic valve was normal in structure. Pulmonic valve regurgitation is trivial. IAS/Shunts: No atrial level shunt detected by color flow Doppler.  LV Volumes (MOD) LV vol d, MOD A4C: 74.1 ml Diastology LV vol s, MOD A2C: 39.1 ml LV e' medial:   7.51 cm/s LV vol s, MOD A4C: 25.9 ml LV E/e' medial: 14.0 LV SV MOD A4C:     74.1 ml MITRAL VALVE MV Area (PHT): 4.68 cm MV E velocity: 105.00 cm/s MV A velocity: 31.70 cm/s MV E/A ratio:  3.31 Franck Azobou Tonleu Electronically signed by Joelle Cedars Tonleu Signature Date/Time: 12/01/2024/12:24:50 PM    Final    CT Angio Chest PE W and/or Wo Contrast Result Date: 11/30/2024 CLINICAL DATA:  Provided history: Pulmonary embolism (PE) suspected, low to intermediate prob, neg D-dimer 89 year old with shortness of breath. EXAM: CT ANGIOGRAPHY CHEST WITH CONTRAST TECHNIQUE: Multidetector CT imaging of the chest was performed using the standard protocol during bolus administration of intravenous contrast. Multiplanar CT image reconstructions and MIPs were obtained to evaluate the vascular anatomy. RADIATION DOSE REDUCTION: This exam was performed according to the departmental dose-optimization program which includes automated exposure control, adjustment of the mA and/or kV according to patient size and/or use of iterative reconstruction technique. CONTRAST:  75mL OMNIPAQUE  IOHEXOL  350 MG/ML SOLN COMPARISON:  Radiograph earlier today.  Chest CTA 07/10/2023 FINDINGS: Cardiovascular: There are no filling defects within the pulmonary arteries to suggest pulmonary embolus. Dilated main pulmonary artery at 3.6 cm. The heart is enlarged. Left-sided pacemaker in place. No pericardial effusion. Aortic atherosclerosis without aneurysm or acute aortic findings. The descending aorta is tortuous.  Mediastinum/Nodes: Shotty mediastinal and right hilar lymph nodes, not enlarged by size criteria. Patulous upper esophagus. No esophageal wall thickening. No visible thyroid  nodule. Lungs/Pleura: Small to moderate size subpleural consolidation within the posterior right lower lobe. The consolidation previously seen in the anterior right lower lobe has resolved. Mild patchy ground-glass opacity in the dependent left lower lobe. Biapical pleuroparenchymal scarring. Please note breathing motion artifact limits detailed parenchymal assessment. The previous right middle lobe nodule is grossly stable, primarily motion obscured, series 12 image 98. No pleural effusion. Trachea and central airways are clear. Upper Abdomen: No acute upper abdominal findings. Punctate nonobstructing left renal calculi. Musculoskeletal: Degenerative change in the spine and shoulders. Review of the MIP images confirms the above findings. IMPRESSION: 1. No pulmonary embolus. 2. Small to moderate size subpleural consolidation in the posterior right lower lobe, suspicious for pneumonia. 3. Mild patchy ground-glass opacity in the dependent left lower lobe, likely atelectasis. 4. Dilated main pulmonary artery, suggesting pulmonary arterial hypertension. Chronic cardiomegaly. Aortic Atherosclerosis (ICD10-I70.0). Electronically Signed   By: Andrea Gasman M.D.   On: 11/30/2024 18:54   DG Chest 2 View Result Date: 11/30/2024 EXAM: 2 VIEW(S) XRAY OF THE CHEST 11/30/2024 02:42:33 PM COMPARISON: 07/10/2023 CLINICAL HISTORY: Shortness of breath FINDINGS: LUNGS AND PLEURA: Biapical pleural thickening. No pulmonary edema. No pleural effusion. No pneumothorax. Streaky opacities in the right lung base, likely atelectasis no scarring. HEART AND MEDIASTINUM: Well positioned left chest pacemaker with leads terminating in the right atrium and right ventricle. BONES AND SOFT TISSUES: Partially visualized posterior fusion hardware in the lumbar spine.  Multilevel thoracolumbar osteophytosis. IMPRESSION: 1. No acute cardiopulmonary abnormality. Electronically signed by: Rogelia Myers MD 11/30/2024 03:26 PM EST RP Workstation: [id number]    Microbiology: Recent Results (from the past 240 hours)  Blood Culture (routine x 2)  Status: None   Collection Time: 12/05/24  3:10 PM   Specimen: Right Antecubital; Blood  Result Value Ref Range Status   Specimen Description   Final    RIGHT ANTECUBITAL Performed at [facility], [address], [place], [place] [address]    Special Requests   Final    BOTTLES DRAWN AEROBIC AND ANAEROBIC Blood Culture adequate volume Performed at [facility], [address], [place], [place] [address]    Culture   Final    NO GROWTH 5 DAYS Performed at [facility], [address], [place], [place] [address]    Report Status 12/10/2024 FINAL  Final  Blood Culture (routine x 2)     Status: None   Collection Time: 12/05/24  3:15 PM   Specimen: BLOOD RIGHT FOREARM  Result Value Ref Range Status   Specimen Description   Final    BLOOD RIGHT FOREARM Performed at [facility], [address] [place], [place] [address]    Special Requests   Final    BOTTLES DRAWN AEROBIC ONLY BLOOD RIGHT FOREARM Performed at [facility], [address] [place], [place] [address]    Culture   Final    NO GROWTH 5 DAYS Performed at [facility], [address], [place], [place] [address]    Report Status 12/10/2024 FINAL  Final  MRSA Next Gen by PCR, Nasal     Status: Abnormal   Collection Time: 12/05/24 10:08 PM   Specimen: Nasal Mucosa; Nasal Swab  Result Value Ref Range Status   MRSA by PCR Next Gen DETECTED (A) NOT DETECTED Final    Comment: RESULT CALLED TO, READ BACK BY AND VERIFIED WITH: JONETTA LESCHES, RN [phone] 12/06/24 BY ALONSO CORDS (NOTE) The GeneXpert MRSA Assay (FDA approved for NASAL specimens only), is one component of a  comprehensive MRSA colonization surveillance program. It is not intended to diagnose MRSA infection nor to guide or monitor treatment for MRSA infections. Test performance is not FDA approved in patients less than 21 years old. Performed at [facility], [address] [place], [place] [address]   C Difficile Quick Screen w PCR reflex     Status: Abnormal   Collection Time: 12/07/24 10:58 AM   Specimen: STOOL  Result Value Ref Range Status   C Diff antigen POSITIVE (A) NEGATIVE Final   C Diff toxin POSITIVE (A) NEGATIVE Final   C Diff interpretation Toxin producing C. difficile detected.  Final    Comment: CRITICAL RESULT CALLED TO, READ BACK BY AND VERIFIED WITH: KATHEE KURTZ, RN ON 12/07/2024 AT 1131 BY SL Performed at [facility], [address] [place], [place] [address]   Gastrointestinal Panel by PCR , Stool     Status: None   Collection Time: 12/07/24 10:58 AM   Specimen: STOOL  Result Value Ref Range Status   Campylobacter species NOT DETECTED NOT DETECTED Final   Plesimonas shigelloides NOT DETECTED NOT DETECTED Final   Salmonella species NOT DETECTED NOT DETECTED Final   Yersinia enterocolitica NOT DETECTED NOT DETECTED Final   Vibrio species NOT DETECTED NOT DETECTED Final   Vibrio cholerae NOT DETECTED NOT DETECTED Final   Enteroaggregative E coli (EAEC) NOT DETECTED NOT DETECTED Final   Enteropathogenic E coli (EPEC) NOT DETECTED NOT DETECTED Final   Enterotoxigenic E coli (ETEC) NOT DETECTED NOT DETECTED Final   Shiga like toxin producing E coli (STEC) NOT DETECTED NOT  DETECTED Final   Shigella/Enteroinvasive E coli (EIEC) NOT DETECTED NOT DETECTED Final   Cryptosporidium NOT DETECTED NOT DETECTED Final   Cyclospora cayetanensis NOT DETECTED NOT DETECTED Final   Entamoeba histolytica NOT DETECTED NOT DETECTED Final   Giardia lamblia NOT DETECTED NOT DETECTED Final   Adenovirus F40/41 NOT DETECTED NOT DETECTED Final   Astrovirus  NOT DETECTED NOT DETECTED Final   Norovirus GI/GII NOT DETECTED NOT DETECTED Final   Rotavirus A NOT DETECTED NOT DETECTED Final   Sapovirus (I, II, IV, and V) NOT DETECTED NOT DETECTED Final    Comment: Performed at [facility], [address] [place], [place] [address]     Labs: Basic Metabolic Panel: Recent Labs  Lab 12/10/24 0421 12/11/24 0845 12/12/24 0438 12/13/24 0410 12/14/24 0428  NA 140 140 142 144 144  K 3.8 4.0 4.1 4.7 4.4  CL 110 113* 113* 115* 116*  CO2 19* 19* 19* 19* 21*  GLUCOSE 94 89 87 86 90  BUN 34* 28* 25* 20 18  CREATININE 3.01* 2.31* 1.99* 1.65* 1.49*  CALCIUM  7.9* 8.2* 8.4* 8.1* 8.3*   Liver Function Tests: No results for input(s): AST, ALT, ALKPHOS, BILITOT, PROT, ALBUMIN in the last 168 hours. No results for input(s): LIPASE, AMYLASE in the last 168 hours. No results for input(s): AMMONIA in the last 168 hours. CBC: Recent Labs  Lab 12/10/24 0421 12/10/24 1536 12/10/24 2257 12/11/24 0845 12/12/24 0438 12/13/24 0410 12/14/24 0428  WBC 3.5*  --   --  3.3* 3.0* 3.5* 3.3*  NEUTROABS  --   --   --   --  1.3*  --   --   HGB 6.7*   < > 8.7* 9.6* 8.8* 8.6* 8.5*  HCT 20.7*   < > 26.4* 28.6* 26.8* 25.4* 25.8*  MCV 97.6  --   --  93.8 94.4 91.7 95.2  PLT 153  --   --  156 157 154 150   < > = values in this interval not displayed.   Cardiac Enzymes: No results for input(s): CKTOTAL, CKMB, CKMBINDEX, TROPONINI in the last 168 hours. BNP: BNP (last 3 results) No results for input(s): BNP in the last 8760 hours.  ProBNP (last 3 results) Recent Labs    12/05/24 1522 12/06/24 0410 12/07/24 0008  PROBNP 4,689.0* 4,621.0* 6,363.0*    CBG: Recent Labs  Lab 12/10/24 0755 12/11/24 0758 12/12/24 0744 12/13/24 0737 12/14/24 0806  GLUCAP 104* 91 92 79 97       Signed:  Toribio Hummer MD.  [facility] 12/14/2024, 4:54 PM        [1]  Allergies Allergen Reactions   Ciprofloxacin  Anaphylaxis, Hives and Other (See Comments)    Dizziness, also   Tramadol Swelling and Other (See Comments)    Laryngeal edema   Duloxetine Other (See Comments)    Delusions    Gabapentin  Other (See Comments)    Affects eyesight and makes the patient very unsteady   Hydrocodone-Acetaminophen  Nausea And Vomiting   Nisoldipine Other (See Comments)    Pt unsure of reaction ([place], for hypertension)   Benadryl [Diphenhydramine] Anxiety and Other (See Comments)    Panic attack    Methocarbamol Nausea Only   Ondansetron  Hcl Nausea Only   Sulfa Antibiotics Rash

## 2024-12-15 ENCOUNTER — Other Ambulatory Visit (HOSPITAL_COMMUNITY): Payer: Self-pay

## 2024-12-16 DIAGNOSIS — N1831 Chronic kidney disease, stage 3a: Secondary | ICD-10-CM | POA: Diagnosis not present

## 2024-12-16 DIAGNOSIS — R079 Chest pain, unspecified: Secondary | ICD-10-CM | POA: Diagnosis not present

## 2024-12-16 DIAGNOSIS — F419 Anxiety disorder, unspecified: Secondary | ICD-10-CM | POA: Diagnosis not present

## 2024-12-16 DIAGNOSIS — I2510 Atherosclerotic heart disease of native coronary artery without angina pectoris: Secondary | ICD-10-CM | POA: Diagnosis not present

## 2024-12-26 ENCOUNTER — Other Ambulatory Visit (HOSPITAL_COMMUNITY): Payer: Self-pay

## 2025-01-02 NOTE — Progress Notes (Signed)
 " Electrophysiology Office Note:   Date:  01/03/2025  ID:  Jaime Grizzell, DOB 05-Nov-1936, MRN [id number]  Primary Cardiologist: Maude Emmer, MD Primary Heart Failure: None Electrophysiologist: Will Gladis Norton, MD       History of Present Illness:   Todd Salazar is a 89 y.o. male with h/o Tachy-Brady s/p PPM, AT, AF, HTN, CAD, bronchiectasis, CKD IIIa, anemia seen today for routine electrophysiology followup.   Admitted from 12/25-12/14/24 for shortness of breath and chest pain. Cardiac work up was negative. He had nausea / vomiting.  CT chest / abd / pelvis showed possible small focal airspace consolidation in R posterior lobe and colon wall thickening.  He was positive for C-difficile.   Since last being seen in our clinic the patient reports he continues to feel weak. He saw his PCP yesterday and had a CBC drawn - Hgb 8.2 (was 7.8 > 9.1 > 9.7).  He has not noted blood in his stool or dark tarry stools. He has resumed taking Eliquis . He feels fatigue and has noted his HR to be 80-90 bpm which is higher than his usual.   He denies chest pain, palpitations, dyspnea, PND, orthopnea, nausea, vomiting, dizziness, syncope, edema, weight gain, or early satiety.   Review of systems complete and found to be negative unless listed in HPI.    EP Information / Studies Reviewed:    EKG is ordered today. Personal review as below.  EKG Interpretation Date/Time:  Friday January 03 2025 08:55:04 EST Ventricular Rate:  90 PR Interval:  210 QRS Duration:  104 QT Interval:  374 QTC Calculation: 457 R Axis:   56  Text Interpretation: Sinus rhythm with 1st degree A-V block Confirmed by Aniceto Jarvis ([id number]) on 01/03/2025 10:00:01 AM   PPM Interrogation-  reviewed in detail today,  See PACEART report.  Arrhythmia/Device History: Abbott Dual Chamber PPM implanted 09/21/2020 for Tachy-Brady syndrome  Physical Exam:   VS:  BP 124/64   Pulse 90   Ht 6' 3 (1.905 m)   Wt 169 lb 12.8 oz (77 kg)    SpO2 95%   BMI 21.22 kg/m    Wt Readings from Last 3 Encounters:  01/03/25 169 lb 12.8 oz (77 kg)  12/14/24 181 lb 3.5 oz (82.2 kg)  11/30/24 175 lb 14.8 oz (79.8 kg)     GEN: Well nourished, well developed in no acute distress NECK: No JVD; No carotid bruits CARDIAC: Regular rate and rhythm, no murmurs, rubs, gallops, PPM site wnl, no tethering  RESPIRATORY:  Clear to auscultation without rales, wheezing or rhonchi  ABDOMEN: Soft, non-tender, non-distended EXTREMITIES:  No edema; No deformity   ASSESSMENT AND PLAN:    Tachy-Brady syndrome s/p Abbott PPM  -Normal PPM function -See Pace Art report -No changes today  Persistent Atrial Fibrillation  CHA2DS2-VASc 3. Hx of failed flecainide , tikosyn , propafenone and sotalol . Historically has not tolerated AV nodal blockers -PRN metoprolol  25 mg for palpitations  -2.4% AF burden > recent illness with GIB  -V rates controlled 60-90 > note running 80-90's since recent illness  Secondary Hypercoagulable State  -continue Eliquis  5mg  BID, dose reviewed and appropriate by wt / cr -Cr recently elevated, may need to dose reduce  -repeat BMP to review for Eliquis  dosing  Recent GIB, C-Diff Colitis  -recent bleeding and C-Diff infection  -Hgb with slight drift  -encouraged pt / family to get in with GI to discuss further    Disposition:   Follow up with EP APP in 3  months to review if further bleeding events.    Signed, Daphne Barrack, NP-C, AGACNP-BC [facility] - Electrophysiology  01/03/2025, 10:00 AM  "

## 2025-01-03 ENCOUNTER — Ambulatory Visit: Payer: Self-pay | Admitting: Cardiology

## 2025-01-03 ENCOUNTER — Ambulatory Visit: Payer: Self-pay | Attending: Pulmonary Disease | Admitting: Pulmonary Disease

## 2025-01-03 ENCOUNTER — Encounter: Payer: Self-pay | Admitting: Pulmonary Disease

## 2025-01-03 VITALS — BP 124/64 | HR 90 | Ht 75.0 in | Wt 169.8 lb

## 2025-01-03 DIAGNOSIS — I495 Sick sinus syndrome: Secondary | ICD-10-CM

## 2025-01-03 DIAGNOSIS — D6869 Other thrombophilia: Secondary | ICD-10-CM | POA: Diagnosis not present

## 2025-01-03 DIAGNOSIS — I4819 Other persistent atrial fibrillation: Secondary | ICD-10-CM

## 2025-01-03 DIAGNOSIS — Z950 Presence of cardiac pacemaker: Secondary | ICD-10-CM | POA: Diagnosis not present

## 2025-01-03 LAB — CUP PACEART INCLINIC DEVICE CHECK
Battery Remaining Longevity: 86 mo
Battery Voltage: 2.99 V
Brady Statistic RA Percent Paced: 9.6 %
Brady Statistic RV Percent Paced: 0.12 %
Date Time Interrogation Session: 20260123095507
Implantable Lead Connection Status: 753985
Implantable Lead Connection Status: 753985
Implantable Lead Implant Date: 20211011
Implantable Lead Implant Date: 20211011
Implantable Lead Location: 753859
Implantable Lead Location: 753860
Implantable Pulse Generator Implant Date: 20211011
Lead Channel Impedance Value: 387.5 Ohm
Lead Channel Impedance Value: 437.5 Ohm
Lead Channel Pacing Threshold Amplitude: 0.625 V
Lead Channel Pacing Threshold Amplitude: 0.875 V
Lead Channel Pacing Threshold Pulse Width: 0.5 ms
Lead Channel Pacing Threshold Pulse Width: 0.5 ms
Lead Channel Sensing Intrinsic Amplitude: 2.1 mV
Lead Channel Sensing Intrinsic Amplitude: 9.7 mV
Lead Channel Setting Pacing Amplitude: 0.875
Lead Channel Setting Pacing Amplitude: 2 V
Lead Channel Setting Pacing Pulse Width: 0.5 ms
Lead Channel Setting Sensing Sensitivity: 2 mV
Pulse Gen Model: 2272
Pulse Gen Serial Number: 3869163

## 2025-01-03 LAB — BASIC METABOLIC PANEL WITH GFR
BUN/Creatinine Ratio: 11 (ref 10–24)
BUN: 17 mg/dL (ref 8–27)
CO2: 21 mmol/L (ref 20–29)
Calcium: 9 mg/dL (ref 8.6–10.2)
Chloride: 107 mmol/L — ABNORMAL HIGH (ref 96–106)
Creatinine, Ser: 1.49 mg/dL — ABNORMAL HIGH (ref 0.76–1.27)
Glucose: 95 mg/dL (ref 70–99)
Potassium: 4.8 mmol/L (ref 3.5–5.2)
Sodium: 142 mmol/L (ref 134–144)
eGFR: 45 mL/min/1.73 — ABNORMAL LOW

## 2025-01-03 NOTE — Patient Instructions (Signed)
 Medication Instructions:  Your physician recommends that you continue on your current medications as directed. Please refer to the Current Medication list given to you today.  *If you need a refill on your cardiac medications before your next appointment, please call your pharmacy*  Lab Work: BMET-TODAY If you have labs (blood work) drawn today and your tests are completely normal, you will receive your results only by: MyChart Message (if you have MyChart) OR A paper copy in the mail If you have any lab test that is abnormal or we need to change your treatment, we will call you to review the results.  Follow-Up: At [facility], you and your health needs are our priority.  As part of our continuing mission to provide you with exceptional heart care, our providers are all part of one team.  This team includes your primary Cardiologist (physician) and Advanced Practice Providers or APPs (Physician Assistants and Nurse Practitioners) who all work together to provide you with the care you need, when you need it.  Your next appointment:   3 month(s)  Provider:   Daphne Barrack, NP

## 2025-03-06 ENCOUNTER — Ambulatory Visit: Payer: Self-pay

## 2025-04-01 ENCOUNTER — Ambulatory Visit: Payer: Medicare (Managed Care) | Admitting: Pulmonary Disease

## 2025-06-05 ENCOUNTER — Ambulatory Visit: Payer: Self-pay

## 2025-09-04 ENCOUNTER — Ambulatory Visit: Payer: Self-pay

## 2025-12-08 ENCOUNTER — Ambulatory Visit: Payer: Self-pay
